# Patient Record
Sex: Male | Born: 1965 | Race: White | Hispanic: No | Marital: Married | Smoking: Former smoker
Health system: Southern US, Community
[De-identification: ages and names within clinical notes are randomized; demographics above are authoritative.]

## PROBLEM LIST (undated history)

## (undated) DIAGNOSIS — E785 Hyperlipidemia, unspecified: Secondary | ICD-10-CM

## (undated) DIAGNOSIS — E669 Obesity, unspecified: Secondary | ICD-10-CM

## (undated) DIAGNOSIS — I509 Heart failure, unspecified: Secondary | ICD-10-CM

## (undated) DIAGNOSIS — J45909 Unspecified asthma, uncomplicated: Secondary | ICD-10-CM

## (undated) DIAGNOSIS — Z86010 Personal history of colonic polyps: Secondary | ICD-10-CM

## (undated) DIAGNOSIS — F1910 Other psychoactive substance abuse, uncomplicated: Secondary | ICD-10-CM

## (undated) DIAGNOSIS — M1990 Unspecified osteoarthritis, unspecified site: Secondary | ICD-10-CM

## (undated) DIAGNOSIS — F419 Anxiety disorder, unspecified: Secondary | ICD-10-CM

## (undated) DIAGNOSIS — R7303 Prediabetes: Secondary | ICD-10-CM

## (undated) DIAGNOSIS — J069 Acute upper respiratory infection, unspecified: Secondary | ICD-10-CM

## (undated) DIAGNOSIS — K52832 Lymphocytic colitis: Secondary | ICD-10-CM

## (undated) DIAGNOSIS — T7840XA Allergy, unspecified, initial encounter: Secondary | ICD-10-CM

## (undated) DIAGNOSIS — E119 Type 2 diabetes mellitus without complications: Secondary | ICD-10-CM

## (undated) DIAGNOSIS — K589 Irritable bowel syndrome without diarrhea: Secondary | ICD-10-CM

## (undated) DIAGNOSIS — I4892 Unspecified atrial flutter: Secondary | ICD-10-CM

## (undated) DIAGNOSIS — C801 Malignant (primary) neoplasm, unspecified: Secondary | ICD-10-CM

## (undated) DIAGNOSIS — G4730 Sleep apnea, unspecified: Secondary | ICD-10-CM

## (undated) DIAGNOSIS — E039 Hypothyroidism, unspecified: Secondary | ICD-10-CM

## (undated) DIAGNOSIS — J449 Chronic obstructive pulmonary disease, unspecified: Secondary | ICD-10-CM

## (undated) DIAGNOSIS — I10 Essential (primary) hypertension: Secondary | ICD-10-CM

## (undated) HISTORY — DX: Unspecified asthma, uncomplicated: J45.909

## (undated) HISTORY — DX: Heart failure, unspecified: I50.9

## (undated) HISTORY — DX: Hypothyroidism, unspecified: E03.9

## (undated) HISTORY — DX: Irritable bowel syndrome, unspecified: K58.9

## (undated) HISTORY — DX: Chronic obstructive pulmonary disease, unspecified: J44.9

## (undated) HISTORY — PX: NO PAST SURGERIES: SHX2092

## (undated) HISTORY — PX: CARDIAC ELECTROPHYSIOLOGY STUDY AND ABLATION: SHX1294

## (undated) HISTORY — DX: Anxiety disorder, unspecified: F41.9

## (undated) HISTORY — DX: Malignant (primary) neoplasm, unspecified: C80.1

## (undated) HISTORY — DX: Type 2 diabetes mellitus without complications: E11.9

## (undated) HISTORY — DX: Personal history of colonic polyps: Z86.010

## (undated) HISTORY — DX: Hyperlipidemia, unspecified: E78.5

## (undated) HISTORY — DX: Prediabetes: R73.03

## (undated) HISTORY — DX: Allergy, unspecified, initial encounter: T78.40XA

## (undated) HISTORY — DX: Unspecified osteoarthritis, unspecified site: M19.90

## (undated) HISTORY — DX: Acute upper respiratory infection, unspecified: J06.9

## (undated) HISTORY — DX: Sleep apnea, unspecified: G47.30

---
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
## 2007-10-01 DIAGNOSIS — I2510 Atherosclerotic heart disease of native coronary artery without angina pectoris: Secondary | ICD-10-CM

## 2007-10-01 HISTORY — DX: Atherosclerotic heart disease of native coronary artery without angina pectoris: I25.10

## 2007-10-01 HISTORY — PX: CORONARY ANGIOGRAM: SHX5786

## 2015-05-31 ENCOUNTER — Ambulatory Visit
Admission: EM | Admit: 2015-05-31 | Discharge: 2015-05-31 | Disposition: A | Discharge status: Home / Self Care | Payer: Self-pay | Attending: Family Medicine | Admitting: Family Medicine

## 2015-05-31 ENCOUNTER — Encounter: Payer: Self-pay | Admitting: Emergency Medicine

## 2015-05-31 DIAGNOSIS — Z0289 Encounter for other administrative examinations: Secondary | ICD-10-CM

## 2015-05-31 LAB — DEPT OF TRANSP DIPSTICK, URINE (ARMC ONLY)
GLUCOSE, UA: NEGATIVE mg/dL
Hgb urine dipstick: NEGATIVE
Protein, ur: NEGATIVE mg/dL
Specific Gravity, Urine: 1.02 (ref 1.005–1.030)

## 2015-05-31 NOTE — ED Provider Notes (Addendum)
SUBJECTIVE: Patient presents for DOT Physical. No acute problems. Patient admits to having a cardiac catheterization in 2009 which was normal. He states this was done because a friend of his died of a MI and his PCP said that his stress test was inconclusive. Patient denies any chest pain or shortness of breath. Patient also admits to having a history of degenerative cervical disc disease. Patient denies any tingling numbness of extremities or weakness of extremities or persistent headaches. He was told that he did not need surgery for his cervical disc disease.    OBJECTIVE: VItals and Exam on form   ASSESSMENT/PLAN: Form reviewed and filled out. Form will be scanned into system. Encouraged regular follow-up with PMD, dentist, and eye doctor yearly. Information given to establish with PCP. Encouraged smoking cessation.  Paulina Fusi, MD 05/31/15 [id number]  Paulina Fusi, MD 05/31/15 [phone]

## 2015-05-31 NOTE — ED Notes (Signed)
Dot exam

## 2015-07-11 ENCOUNTER — Ambulatory Visit: Payer: Self-pay | Admitting: Family Medicine

## 2015-07-18 ENCOUNTER — Ambulatory Visit: Payer: Self-pay | Admitting: Family Medicine

## 2015-07-26 ENCOUNTER — Encounter: Payer: Self-pay | Admitting: Family Medicine

## 2015-07-26 ENCOUNTER — Ambulatory Visit (INDEPENDENT_AMBULATORY_CARE_PROVIDER_SITE_OTHER): Payer: BLUE CROSS/BLUE SHIELD | Admitting: Family Medicine

## 2015-07-26 VITALS — BP 132/90 | HR 68 | Temp 98.7°F | Ht 67.5 in | Wt 255.0 lb

## 2015-07-26 DIAGNOSIS — Z23 Encounter for immunization: Secondary | ICD-10-CM

## 2015-07-26 DIAGNOSIS — Z1211 Encounter for screening for malignant neoplasm of colon: Secondary | ICD-10-CM

## 2015-07-26 DIAGNOSIS — M1000 Idiopathic gout, unspecified site: Secondary | ICD-10-CM

## 2015-07-26 DIAGNOSIS — J329 Chronic sinusitis, unspecified: Secondary | ICD-10-CM | POA: Diagnosis not present

## 2015-07-26 DIAGNOSIS — E669 Obesity, unspecified: Secondary | ICD-10-CM | POA: Insufficient documentation

## 2015-07-26 DIAGNOSIS — M109 Gout, unspecified: Secondary | ICD-10-CM | POA: Insufficient documentation

## 2015-07-26 DIAGNOSIS — M5090 Cervical disc disorder, unspecified, unspecified cervical region: Secondary | ICD-10-CM | POA: Diagnosis not present

## 2015-07-26 DIAGNOSIS — F17200 Nicotine dependence, unspecified, uncomplicated: Secondary | ICD-10-CM

## 2015-07-26 DIAGNOSIS — Z720 Tobacco use: Secondary | ICD-10-CM | POA: Diagnosis not present

## 2015-07-26 DIAGNOSIS — Z87891 Personal history of nicotine dependence: Secondary | ICD-10-CM | POA: Insufficient documentation

## 2015-07-26 MED ORDER — ALLOPURINOL 300 MG PO TABS: 300.0000 mg | ORAL_TABLET | Freq: Every day | ORAL | Status: DC
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
## 2015-07-27 LAB — COMPREHENSIVE METABOLIC PANEL
ALBUMIN: 4.3 g/dL (ref 3.5–5.5)
ALT: 38 IU/L (ref 0–44)
AST: 24 IU/L (ref 0–40)
Albumin/Globulin Ratio: 2 (ref 1.1–2.5)
Alkaline Phosphatase: 57 IU/L (ref 39–117)
BUN / CREAT RATIO: 17 (ref 9–20)
BUN: 12 mg/dL (ref 6–24)
Bilirubin Total: 0.2 mg/dL (ref 0.0–1.2)
CALCIUM: 9.4 mg/dL (ref 8.7–10.2)
CO2: 30 mmol/L — AB (ref 18–29)
CREATININE: 0.7 mg/dL — AB (ref 0.76–1.27)
Chloride: 98 mmol/L (ref 97–106)
GFR calc non Af Amer: 109 mL/min/{1.73_m2} (ref >59)
GFR, EST AFRICAN AMERICAN: 126 mL/min/{1.73_m2} (ref >59)
GLUCOSE: 94 mg/dL (ref 65–99)
Globulin, Total: 2.1 g/dL (ref 1.5–4.5)
Potassium: 5 mmol/L (ref 3.5–5.2)
Sodium: 140 mmol/L (ref 136–144)
TOTAL PROTEIN: 6.4 g/dL (ref 6.0–8.5)

## 2015-07-27 LAB — CBC
HEMATOCRIT: 44 % (ref 37.5–51.0)
HEMOGLOBIN: 14.8 g/dL (ref 12.6–17.7)
MCH: 31.5 pg (ref 26.6–33.0)
MCHC: 33.6 g/dL (ref 31.5–35.7)
MCV: 94 fL (ref 79–97)
Platelets: 197 10*3/uL (ref 150–379)
RBC: 4.7 x10E6/uL (ref 4.14–5.80)
RDW: 12.7 % (ref 12.3–15.4)
WBC: 7.8 10*3/uL (ref 3.4–10.8)

## 2015-07-27 LAB — LIPID PANEL
CHOL/HDL RATIO: 3.8 ratio (ref 0.0–5.0)
Cholesterol, Total: 175 mg/dL (ref 100–199)
HDL: 46 mg/dL (ref >39)
LDL CALC: 62 mg/dL (ref 0–99)
Triglycerides: 335 mg/dL — ABNORMAL HIGH (ref 0–149)
VLDL Cholesterol Cal: 67 mg/dL — ABNORMAL HIGH (ref 5–40)

## 2015-07-27 LAB — HEMOGLOBIN A1C
Est. average glucose Bld gHb Est-mCnc: 120 mg/dL
Hgb A1c MFr Bld: 5.8 % — ABNORMAL HIGH (ref 4.8–5.6)

## 2015-07-27 LAB — VITAMIN D 25 HYDROXY (VIT D DEFICIENCY, FRACTURES): VIT D 25 HYDROXY: 24.5 ng/mL — AB (ref 30.0–100.0)

## 2015-07-27 LAB — URIC ACID: URIC ACID: 6.2 mg/dL (ref 3.7–8.6)

## 2015-08-29 ENCOUNTER — Ambulatory Visit: Payer: BLUE CROSS/BLUE SHIELD | Admitting: Family Medicine

## 2015-09-05 ENCOUNTER — Ambulatory Visit (INDEPENDENT_AMBULATORY_CARE_PROVIDER_SITE_OTHER): Payer: BLUE CROSS/BLUE SHIELD | Admitting: Family Medicine

## 2015-09-05 ENCOUNTER — Encounter: Payer: Self-pay | Admitting: Family Medicine

## 2015-09-05 VITALS — BP 126/88 | HR 76 | Ht 67.5 in | Wt 253.6 lb

## 2015-09-05 DIAGNOSIS — M5090 Cervical disc disorder, unspecified, unspecified cervical region: Secondary | ICD-10-CM

## 2015-09-05 DIAGNOSIS — R7303 Prediabetes: Secondary | ICD-10-CM

## 2015-09-05 DIAGNOSIS — E782 Mixed hyperlipidemia: Secondary | ICD-10-CM | POA: Insufficient documentation

## 2015-09-05 DIAGNOSIS — J329 Chronic sinusitis, unspecified: Secondary | ICD-10-CM | POA: Diagnosis not present

## 2015-09-05 DIAGNOSIS — Z720 Tobacco use: Secondary | ICD-10-CM | POA: Diagnosis not present

## 2015-09-05 DIAGNOSIS — M1A00X0 Idiopathic chronic gout, unspecified site, without tophus (tophi): Secondary | ICD-10-CM | POA: Diagnosis not present

## 2015-09-05 DIAGNOSIS — E669 Obesity, unspecified: Secondary | ICD-10-CM

## 2015-09-05 DIAGNOSIS — F17200 Nicotine dependence, unspecified, uncomplicated: Secondary | ICD-10-CM

## 2015-09-05 DIAGNOSIS — E559 Vitamin D deficiency, unspecified: Secondary | ICD-10-CM | POA: Diagnosis not present

## 2015-09-05 DIAGNOSIS — E781 Pure hyperglyceridemia: Secondary | ICD-10-CM | POA: Insufficient documentation

## 2015-09-05 NOTE — Progress Notes (Signed)
Date:  09/05/2015   Name:  Ralph Mack   DOB:  1966-08-28   MRN:  [id number]  PCP:  Adline Potter, MD    Chief Complaint: Gout and Hyperglycemia   History of Present Illness:  This is a 49 y.o. male seen in f/u from initial visit last month. Blood work showed prediabetes, he has cut back on carbs and sweets and just installed a treadmill in his home.  Also showed vit D def and he is taking supplementation. Smoking less as not drinking beer daily. Has ENT referral for recurrent sinusitis but has not scheduled appt yet. Has also not seen chiro or GI yet. No recent gout flares.  Review of Systems:  Review of Systems  Respiratory: Negative for shortness of breath.   Cardiovascular: Negative for chest pain and leg swelling.  Endocrine: Negative for polyuria.  Genitourinary: Negative for difficulty urinating.  Neurological: Negative for syncope and light-headedness.    Patient Active Problem List   Diagnosis Date Noted  . Prediabetes 09/05/2015  . Vitamin D deficiency 09/05/2015  . Gout 07/26/2015  . Obesity, Class II, BMI 35-39.9, isolated ([place]) 07/26/2015  . Smoker 07/26/2015  . Recurrent sinusitis 07/26/2015  . Cervical disc disease 07/26/2015    Prior to Admission medications   Medication Sig Start Date End Date Taking? Authorizing Provider  allopurinol (ZYLOPRIM) 300 MG tablet Take 1 tablet (300 mg total) by mouth daily. 07/26/15  Yes Adline Potter, MD    No Known Allergies  No past surgical history on file.  Social History  Substance Use Topics  . Smoking status: Current Every Day Smoker  . Smokeless tobacco: None  . Alcohol Use: 0.0 oz/week    0 Standard drinks or equivalent per week    No family history on file.  Medication list has been reviewed and updated.  Physical Examination: BP 126/88 mmHg  Pulse 76  Ht 5' 7.5" (1.715 m)  Wt 253 lb 9.6 oz (115.032 kg)  BMI 39.11 kg/m2  Physical Exam  Constitutional: He appears well-developed and well-nourished.   Cardiovascular: Normal rate, regular rhythm and normal heart sounds.   Pulmonary/Chest: Effort normal and breath sounds normal.  Musculoskeletal: He exhibits no edema.  Neurological: He is alert.  Skin: Skin is warm and dry.  Psychiatric: He has a normal mood and affect. His behavior is normal.  Nursing note and vitals reviewed.   Assessment and Plan:  1. Prediabetes Dx discussed, cont NAS/low carb diet, exercise, weight loss, recheck a1c next visit  2. Obesity, Class II, BMI 35-39.9, isolated (HCC) Cont weight loss (down 2# today)  3. Smoker Cont reduction, encouraged cessation  4. Vitamin D deficiency On supplementation, recheck level next visit  5. Cervical disc disease To see chiro  6. Recurrent sinusitis To see ENT  7. Idiopathic chronic gout without tophus, unspecified site Stable, cont allopurinol (uric acid 6.2)   Return in about 3 months (around 12/04/2015).  Satira Anis. [facility]  09/05/2015

## 2015-09-12 ENCOUNTER — Other Ambulatory Visit: Payer: Self-pay | Admitting: Otolaryngology

## 2015-09-12 ENCOUNTER — Ambulatory Visit
Admission: RE | Admit: 2015-09-12 | Discharge: 2015-09-12 | Disposition: A | Discharge status: Home / Self Care | Payer: Self-pay | Source: Ambulatory Visit | Attending: Otolaryngology | Admitting: Otolaryngology

## 2015-09-12 DIAGNOSIS — J410 Simple chronic bronchitis: Secondary | ICD-10-CM

## 2015-09-13 ENCOUNTER — Other Ambulatory Visit: Payer: Self-pay

## 2015-09-13 ENCOUNTER — Telehealth: Payer: Self-pay

## 2015-09-13 DIAGNOSIS — Z1211 Encounter for screening for malignant neoplasm of colon: Secondary | ICD-10-CM

## 2015-09-13 MED ORDER — PEG 3350-KCL-NA BICARB-NACL 420 G PO SOLR: 4000.0000 mL | Freq: Once | ORAL | Status: DC

## 2015-09-13 NOTE — Telephone Encounter (Signed)
Gastroenterology Pre-Procedure Review  Request Date: 10/23/15 Requesting Physician: Dr. Vicente Masson  PATIENT REVIEW QUESTIONS: The patient responded to the following health history questions as indicated:    1. Are you having any GI issues? no 2. Do you have a personal history of Polyps? no 3. Do you have a family history of Colon Cancer or Polyps? no 4. Diabetes Mellitus? yes (Prediabetic) 5. Joint replacements in the past 12 months?no 6. Major health problems in the past 3 months?no 7. Any artificial heart valves, MVP, or defibrillator?no    MEDICATIONS & ALLERGIES:    Patient reports the following regarding taking any anticoagulation/antiplatelet therapy:   Plavix, Coumadin, Eliquis, Xarelto, Lovenox, Pradaxa, Brilinta, or Effient? no Aspirin? no  Patient confirms/reports the following medications:  Current Outpatient Prescriptions  Medication Sig Dispense Refill  . allopurinol (ZYLOPRIM) 300 MG tablet Take 1 tablet (300 mg total) by mouth daily. 90 tablet 3   No current facility-administered medications for this visit.    Patient confirms/reports the following allergies:  No Known Allergies  No orders of the defined types were placed in this encounter.    AUTHORIZATION INFORMATION Primary Insurance: 1D#: Group #:  Secondary Insurance: 1D#: Group #:  SCHEDULE INFORMATION: Date: 10/23/15 Time: Location: [place]

## 2015-09-22 ENCOUNTER — Ambulatory Visit
Admission: EM | Admit: 2015-09-22 | Discharge: 2015-09-22 | Discharge status: Absent without leave | Payer: BLUE CROSS/BLUE SHIELD

## 2015-10-23 ENCOUNTER — Encounter: Admission: RE | Payer: Self-pay | Source: Ambulatory Visit

## 2015-10-23 ENCOUNTER — Ambulatory Visit
Admission: RE | Admit: 2015-10-23 | Payer: BLUE CROSS/BLUE SHIELD | Source: Ambulatory Visit | Admitting: Gastroenterology

## 2015-10-23 SURGERY — COLONOSCOPY WITH PROPOFOL
Anesthesia: Choice

## 2015-12-05 ENCOUNTER — Ambulatory Visit: Payer: BLUE CROSS/BLUE SHIELD | Admitting: Family Medicine

## 2016-08-14 ENCOUNTER — Other Ambulatory Visit: Payer: Self-pay | Admitting: Family Medicine

## 2016-08-14 ENCOUNTER — Other Ambulatory Visit: Payer: Self-pay

## 2016-08-14 DIAGNOSIS — M1000 Idiopathic gout, unspecified site: Secondary | ICD-10-CM

## 2016-09-26 ENCOUNTER — Ambulatory Visit (INDEPENDENT_AMBULATORY_CARE_PROVIDER_SITE_OTHER): Payer: BLUE CROSS/BLUE SHIELD | Admitting: Family Medicine

## 2016-09-26 ENCOUNTER — Telehealth: Payer: Self-pay | Admitting: Family Medicine

## 2016-09-26 ENCOUNTER — Encounter: Payer: Self-pay | Admitting: Family Medicine

## 2016-09-26 VITALS — BP 128/79 | HR 84 | Ht 67.5 in | Wt 243.3 lb

## 2016-09-26 DIAGNOSIS — Z800 Family history of malignant neoplasm of digestive organs: Secondary | ICD-10-CM | POA: Diagnosis not present

## 2016-09-26 DIAGNOSIS — E66812 Obesity, class 2: Secondary | ICD-10-CM

## 2016-09-26 DIAGNOSIS — M1A4710 Other secondary chronic gout, right ankle and foot, without tophus (tophi): Secondary | ICD-10-CM | POA: Diagnosis not present

## 2016-09-26 DIAGNOSIS — F1911 Other psychoactive substance abuse, in remission: Secondary | ICD-10-CM

## 2016-09-26 DIAGNOSIS — F17200 Nicotine dependence, unspecified, uncomplicated: Secondary | ICD-10-CM

## 2016-09-26 DIAGNOSIS — K921 Melena: Secondary | ICD-10-CM | POA: Diagnosis not present

## 2016-09-26 DIAGNOSIS — Z87898 Personal history of other specified conditions: Secondary | ICD-10-CM

## 2016-09-26 DIAGNOSIS — E669 Obesity, unspecified: Secondary | ICD-10-CM | POA: Diagnosis not present

## 2016-09-26 DIAGNOSIS — F1020 Alcohol dependence, uncomplicated: Secondary | ICD-10-CM

## 2016-09-26 DIAGNOSIS — Z1211 Encounter for screening for malignant neoplasm of colon: Secondary | ICD-10-CM

## 2016-09-26 DIAGNOSIS — J3089 Other allergic rhinitis: Secondary | ICD-10-CM | POA: Diagnosis not present

## 2016-09-26 DIAGNOSIS — R6882 Decreased libido: Secondary | ICD-10-CM | POA: Diagnosis not present

## 2016-09-26 DIAGNOSIS — F10920 Alcohol use, unspecified with intoxication, uncomplicated: Secondary | ICD-10-CM | POA: Insufficient documentation

## 2016-09-26 NOTE — Assessment & Plan Note (Addendum)
Risks assoc with heavy drinking r/w pt.   Pt denies depression/ mood disorder, denies significant barriers of education, money etc.  Explained many significant risks associated with this disease process.   Will check LFT's etc esp prior to starting on chronic gout meds.   Advised pt to cut back significantly on etoh use.

## 2016-09-26 NOTE — Telephone Encounter (Signed)
Patient has enough allopurinol to last til next week and was wondering if Dr. Jenetta Downer would call ina 10 day supply for him

## 2016-09-26 NOTE — Patient Instructions (Addendum)
If you have insomnia or difficulty sleeping, this information is for you:  - Avoid caffeinated beverages after lunch,  no alcoholic beverages,  no eating within 2-3 hours of lying down,  avoid exposure to blue light before bed,  avoid daytime naps, and  needs to maintain a regular sleep schedule- go to sleep and wake up around the same time every night.   - Resolve concerns or worries before entering bedroom:  Discussed relaxation techniques with patient and to keep a journal to write down fears\ worries.  I suggested seeing a counselor for CBT.   - Recommend patient meditate or do deep breathing exercises to help relax.   Incorporate the use of white noise machines or listen to "sleep meditation music", or recordings of guided meditations for sleep from YouTube which are free, such as  "guided meditation for detachment from over thinking"  by Mayford Knife.        ------->   Sinus rinses BID- use distilled water.

## 2016-09-26 NOTE — Progress Notes (Signed)
New patient office visit note:  Impression and Recommendations:    1. 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 Smoker   3. Low libido   4. Environmental and seasonal allergies   5. Uncomplicated alcohol dependence ([place])   6. History of substance abuse   7. Hematochezia   8. Screening for colon cancer   9. Obesity, Class II, BMI 35-39.9, isolated   10. Family hx of colon cancer requiring screening colonoscopy     Smoker No desire to quit currently.  But will consider making it a goal in future.  Handouts given to pt.    Gout First flare 2006-->Chronic, recurring, usually in great toe.  Last flare was 3 yrs ago. So well controlled.     Will obtain bldwrk; advised pt lifestyle and diet mod at this time. ( esp cut out beer )   Alcohol use Risks assoc with heavy drinking r/w pt.   Pt denies depression/ mood disorder, denies significant barriers of education, money etc.  Explained many significant risks associated with this disease process.   Will check LFT's etc esp prior to starting on chronic gout meds.   Advised pt to cut back significantly on etoh use.  History of substance abuse Explained inc risk CV Dx etc to pt.   Will obtain FLP.   Advised exercise 16min QD  Obesity, Class II, BMI 35-39.9, isolated Rec wt loss.   Declines nutrition referral  Will obtain labs to r/o Pre-DM/ DM, HLD/ TG, Tsh d/o etc near future.    Pt told to come in for fasting bldwrk prior to next appt.   Family hx of colon cancer requiring screening colonoscopy Referral for colonoscopy sent today.  Counseling done  Hematochezia Pt declines rectal today.  Please give pt hemoccult cards for home use.   Referral to GI for scope     Orders Placed This Encounter  Procedures  . Ambulatory referral to Gastroenterology    The patient was counseled, risk factors were discussed, anticipatory guidance given.  Gross side  effects, risk and benefits, and alternatives of medications discussed with patient.  Patient is aware that all medications have potential side effects and we are unable to predict every side effect or drug-drug interaction that may occur.  Expresses verbal understanding and consents to current therapy plan and treatment regimen.  Return for near future for FBW , then oV.  Please see AVS handed out to patient at the end of our visit for further patient instructions/ counseling done pertaining to today's office visit.    Note: This document was prepared using Dragon voice recognition software and may include unintentional dictation errors.  ----------------------------------------------------------------------------------------------------------------------    Subjective:    Chief Complaint  Patient presents with  . Establish Care    HPI: Ralph Mack is a pleasant 50 y.o. male who presents to [place] [facility] today to review their medical history with me and establish care.   I asked the patient to review their chronic problem list with me to ensure everything was updated and accurate.  Denies chronic issues of HTN, HLD, DM/Pre-Dm etc.                                              Patient is married to  Denny Peon- my patient as well.  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  They have 2 children and he lives at home with his wife and daughter.   Tobacco abuse:   Current smoker  ,Smoking since 50 yo-->  Around 1 ppd 32 yrs.  Not interested in quitting. But has quit many times in the past.    Gout First flare 2006--> last one 3 yrs ago.  Chronic,  recurring, usually in great toe.  No acute sx today. Worried he needs chronic suppressive medications  Alcohol abuse:   Also drinks excessively-  12-18 drinks per day on the weekends- beer and liquor.   Not interested in cutting back.   History of drug abuse:   Used regularly for 8-10 yrs.  No use  many yrs--->  20 yrs.    Explained to patient this pts him at increased risk for heart attacks and stroke in the future.      -Patient has complaints today of  rectal bleeding of many years duration - painless and usually the first bowel movement of the day.  Very small amounts.  He tells me that his prior doctor, Dr. Vicente Masson, had referred him to gastroenterology for a colonoscopy but patient never went.  He would like another referral today.   -Patient also had concerns about his low sex drive/ libido for the past year.  Feels bad because he says he lacks desire, no erectile dysf or other  - Also c/o insomnia, many yrs.  No sleep hygeine- very poor schedule, no routine.      Wt Readings from Last 3 Encounters:  11/08/16 247 lb (112 kg)  10/31/16 247 lb (112 kg)  10/24/16 248 lb 8 oz (112.7 kg)   BP Readings from Last 3 Encounters:  11/08/16 (!) 150/93  10/31/16 116/72  10/24/16 121/77   Pulse Readings from Last 3 Encounters:  11/08/16 62  10/31/16 68  10/24/16 73   BMI Readings from Last 3 Encounters:  11/08/16 38.69 kg/m  10/31/16 38.69 kg/m  10/24/16 38.35 kg/m    Patient Care Team    Relationship Specialty Notifications Start End  [name], DO PCP - General Family Medicine  09/26/16   Melida Quitter, MD Consulting Physician Otolaryngology  09/26/16   Shaylar Charmian Muff, MD Consulting Physician Allergy  09/26/16    Comment: [address] [place].   Gatha Mayer, MD Consulting Physician Gastroenterology  11/21/16     Patient Active Problem List   Diagnosis Date Noted  . Hypothyroidism 10/24/2016    Priority: High  . OAB (overactive bladder) 10/24/2016    Priority: High  . Prediabetes 09/05/2015    Priority: High  . Hypertriglyceridemia 09/05/2015    Priority: High  . Obesity, Class II, BMI 35-39.9, isolated 07/26/2015    Priority: High  . Smoker 07/26/2015    Priority: High  . Fatigue 10/24/2016    Priority: Medium  . Alcohol use 09/26/2016     Priority: Medium  . History of substance abuse 09/26/2016    Priority: Medium  . Gout 07/26/2015    Priority: Medium  . Urinary frequency 10/24/2016    Priority: Low  . Low libido 09/26/2016    Priority: Low  . Environmental and seasonal allergies 09/26/2016    Priority: Low  . Family hx of colon cancer requiring screening colonoscopy 09/26/2016    Priority: Low  . Vitamin D deficiency 09/05/2015    Priority: Low  . Hx of colonic polyp  11/13/2016  . Hematochezia 09/26/2016  . Screening for colon cancer 09/26/2016  . Recurrent sinusitis 07/26/2015  . Cervical disc disease 07/26/2015     Past Medical History:  Diagnosis Date  . Allergy   . Arthritis   . Hx of colonic polyp 11/13/2016   10/2016 7 mm ssp/a - recall 2023  . Hypothyroidism   . Prediabetes   . Recurrent upper respiratory infection (URI)      Past Medical History:  Diagnosis Date  . Allergy   . Arthritis   . Hx of colonic polyp 11/13/2016   10/2016 7 mm ssp/a - recall 2023  . Hypothyroidism   . Prediabetes   . Recurrent upper respiratory infection (URI)      Past Surgical History:  Procedure Laterality Date  . NO PAST SURGERIES       Family History  Problem Relation Age of Onset  . Asthma Mother   . COPD Mother   . Cancer Mother     skin  . Heart disease Father   . Parkinson's disease Father   . Depression Daughter   . Diabetes Paternal Grandfather   . Kidney disease Paternal Grandfather   . Colon polyps Neg Hx   . Colon cancer Neg Hx   . Esophageal cancer Neg Hx   . Stomach cancer Neg Hx   . Rectal cancer Neg Hx      History  Drug Use No    History  Alcohol Use  . 12.0 oz/week  . 20 Cans of beer per week    History  Smoking Status  . Current Every Day Smoker  . Packs/day: 1.00  . Years: 30.00  . Types: Cigarettes  Smokeless Tobacco  . Never Used     Previous Medications   FEXOFENADINE (ALLEGRA) 180 MG TABLET    Take 180 mg by mouth daily.   FLUTICASONE (FLONASE) 50  MCG/ACT NASAL SPRAY    Place 2 sprays into the nose daily.     Allergies: Patient has no known allergies.  Review of Systems  Constitutional: Positive for diaphoresis. Negative for chills, fever, malaise/fatigue and weight loss.  HENT: Positive for tinnitus. Negative for congestion and sore throat.   Eyes: Negative for blurred vision, double vision and photophobia.  Respiratory: Negative for cough and wheezing.   Cardiovascular: Negative for chest pain and palpitations.  Gastrointestinal: Negative for blood in stool, diarrhea, nausea and vomiting.  Genitourinary: Negative for dysuria, frequency and urgency.       Nocturia  Musculoskeletal: Negative for joint pain and myalgias.  Skin: Negative for itching and rash.  Neurological: Negative for dizziness, focal weakness, weakness and headaches.  Endo/Heme/Allergies: Negative for environmental allergies and polydipsia. Does not bruise/bleed easily.  Psychiatric/Behavioral: Negative for depression and memory loss. The patient is nervous/anxious. The patient does not have insomnia.      Objective:    Blood pressure 128/79, pulse 84, height 5' 7.5" (1.715 m), weight 243 lb 4.8 oz (110.4 kg). Body mass index is 37.54 kg/m. General: Well Developed, well nourished, and in no acute distress.  Neuro: Alert and oriented x3, extra-ocular muscles intact, sensation grossly intact.  HEENT: Normocephalic, atraumatic, pupils equal round reactive to light, neck supple Cardiac: S1,S2, Regular rate and rhythm Respiratory: Essentially clear to auscultation bilaterally- distant due to habitus. Not using accessory muscles, speaking in full sentences.  Abdominal: not grossly distended Musculoskeletal: Ambulates w/o diff, FROM * 4 ext.  Vasc: less 2 sec cap RF, warm and pink  Psych:  No HI/SI, judgement and insight good, Euthymic mood. Full Affect.

## 2016-09-26 NOTE — Assessment & Plan Note (Addendum)
Explained inc risk CV Dx etc to pt.   Will obtain FLP.   Advised exercise 53min QD

## 2016-09-26 NOTE — Assessment & Plan Note (Addendum)
No desire to quit currently.  But will consider making it a goal in future.  Handouts given to pt.

## 2016-09-26 NOTE — Assessment & Plan Note (Addendum)
First flare 2006-->Chronic, recurring, usually in great toe.  Last flare was 3 yrs ago. So well controlled.     Will obtain bldwrk; advised pt lifestyle and diet mod at this time. ( esp cut out beer )

## 2016-09-27 ENCOUNTER — Ambulatory Visit: Payer: Self-pay | Admitting: Allergy

## 2016-09-27 ENCOUNTER — Other Ambulatory Visit: Payer: Self-pay | Admitting: Family Medicine

## 2016-09-27 ENCOUNTER — Encounter: Payer: Self-pay | Admitting: Nurse Practitioner

## 2016-09-27 ENCOUNTER — Other Ambulatory Visit: Payer: Self-pay

## 2016-09-27 DIAGNOSIS — M1000 Idiopathic gout, unspecified site: Secondary | ICD-10-CM

## 2016-09-27 MED ORDER — ALLOPURINOL 300 MG PO TABS: 300.0000 mg | ORAL_TABLET | Freq: Every day | ORAL | 3 refills | Status: DC

## 2016-09-27 MED ORDER — ALLOPURINOL 300 MG PO TABS: 300.0000 mg | ORAL_TABLET | Freq: Every day | ORAL | 0 refills | Status: DC

## 2016-09-27 NOTE — Telephone Encounter (Signed)
Pt informed that RX for 10 days sent to pharmacy.  Charyl Bigger, CMA

## 2016-09-30 HISTORY — PX: COLONOSCOPY: SHX174

## 2016-10-01 ENCOUNTER — Telehealth: Payer: Self-pay | Admitting: Family Medicine

## 2016-10-01 NOTE — Telephone Encounter (Signed)
Pt's chart already has [facility] [address] in [place] listed as their Pharmacy. Bobetta Lime CMA, RT

## 2016-10-01 NOTE — Telephone Encounter (Signed)
Patient is requesting his pharmacy be changed from [facility] in [place] to [facility] [address] in [place]

## 2016-10-02 ENCOUNTER — Other Ambulatory Visit: Payer: Self-pay

## 2016-10-02 DIAGNOSIS — E559 Vitamin D deficiency, unspecified: Secondary | ICD-10-CM

## 2016-10-02 DIAGNOSIS — E781 Pure hyperglyceridemia: Secondary | ICD-10-CM

## 2016-10-02 DIAGNOSIS — R7303 Prediabetes: Secondary | ICD-10-CM

## 2016-10-02 DIAGNOSIS — Z0000 Encounter for general adult medical examination without abnormal findings: Secondary | ICD-10-CM

## 2016-10-03 ENCOUNTER — Other Ambulatory Visit (INDEPENDENT_AMBULATORY_CARE_PROVIDER_SITE_OTHER): Payer: BLUE CROSS/BLUE SHIELD

## 2016-10-03 DIAGNOSIS — Z0000 Encounter for general adult medical examination without abnormal findings: Secondary | ICD-10-CM

## 2016-10-03 DIAGNOSIS — R7303 Prediabetes: Secondary | ICD-10-CM

## 2016-10-03 DIAGNOSIS — E559 Vitamin D deficiency, unspecified: Secondary | ICD-10-CM

## 2016-10-03 DIAGNOSIS — E781 Pure hyperglyceridemia: Secondary | ICD-10-CM

## 2016-10-03 LAB — POCT GLYCOSYLATED HEMOGLOBIN (HGB A1C): Hemoglobin A1C: 5.4

## 2016-10-04 LAB — LIPID PANEL
CHOL/HDL RATIO: 4 ratio (ref 0.0–5.0)
Cholesterol, Total: 184 mg/dL (ref 100–199)
HDL: 46 mg/dL (ref >39)
LDL CALC: 113 mg/dL — AB (ref 0–99)
Triglycerides: 124 mg/dL (ref 0–149)
VLDL Cholesterol Cal: 25 mg/dL (ref 5–40)

## 2016-10-04 LAB — COMPREHENSIVE METABOLIC PANEL
A/G RATIO: 1.9 (ref 1.2–2.2)
ALBUMIN: 4.3 g/dL (ref 3.5–5.5)
ALT: 37 IU/L (ref 0–44)
AST: 22 IU/L (ref 0–40)
Alkaline Phosphatase: 52 IU/L (ref 39–117)
BUN/Creatinine Ratio: 21 — ABNORMAL HIGH (ref 9–20)
BUN: 18 mg/dL (ref 6–24)
Bilirubin Total: 0.4 mg/dL (ref 0.0–1.2)
CALCIUM: 9.1 mg/dL (ref 8.7–10.2)
CO2: 30 mmol/L — ABNORMAL HIGH (ref 18–29)
Chloride: 96 mmol/L (ref 96–106)
Creatinine, Ser: 0.86 mg/dL (ref 0.76–1.27)
GFR, EST AFRICAN AMERICAN: 115 mL/min/{1.73_m2} (ref >59)
GFR, EST NON AFRICAN AMERICAN: 100 mL/min/{1.73_m2} (ref >59)
GLOBULIN, TOTAL: 2.3 g/dL (ref 1.5–4.5)
Glucose: 110 mg/dL — ABNORMAL HIGH (ref 65–99)
Potassium: 5.3 mmol/L — ABNORMAL HIGH (ref 3.5–5.2)
SODIUM: 139 mmol/L (ref 134–144)
TOTAL PROTEIN: 6.6 g/dL (ref 6.0–8.5)

## 2016-10-04 LAB — CBC
HEMATOCRIT: 44.1 % (ref 37.5–51.0)
HEMOGLOBIN: 15 g/dL (ref 13.0–17.7)
MCH: 32.2 pg (ref 26.6–33.0)
MCHC: 34 g/dL (ref 31.5–35.7)
MCV: 95 fL (ref 79–97)
Platelets: 160 10*3/uL (ref 150–379)
RBC: 4.66 x10E6/uL (ref 4.14–5.80)
RDW: 13 % (ref 12.3–15.4)
WBC: 8.2 10*3/uL (ref 3.4–10.8)

## 2016-10-04 LAB — TSH: TSH: 5.01 u[IU]/mL — AB (ref 0.450–4.500)

## 2016-10-04 LAB — VITAMIN D 25 HYDROXY (VIT D DEFICIENCY, FRACTURES): Vit D, 25-Hydroxy: 50.3 ng/mL (ref 30.0–100.0)

## 2016-10-09 ENCOUNTER — Encounter: Payer: Self-pay | Admitting: Family Medicine

## 2016-10-10 ENCOUNTER — Encounter: Payer: Self-pay | Admitting: Nurse Practitioner

## 2016-10-10 ENCOUNTER — Ambulatory Visit (INDEPENDENT_AMBULATORY_CARE_PROVIDER_SITE_OTHER): Payer: BLUE CROSS/BLUE SHIELD | Admitting: Nurse Practitioner

## 2016-10-10 VITALS — BP 120/78 | HR 76 | Ht 67.5 in | Wt 247.4 lb

## 2016-10-10 DIAGNOSIS — K625 Hemorrhage of anus and rectum: Secondary | ICD-10-CM

## 2016-10-10 NOTE — Progress Notes (Signed)
      HPI: Patient is a 51 year old male referred by PCP, Dr. [name],  for rectal bleeding. Patient has occasional painless rectal bleeding with BMs, usually occurs with first BM of the day. Bleeding is small volume and has been happening for years. He has occasional constipation. . Never had a colonoscopy before, he has no [facility] of colon cancer.   Patient has a remote history of GERD. He used to take a PPI for heartburn but no longer needs it. He has frequent gas type pain in his chest which is always relieved with belching. No dysphagia, no unusual weight loss. No SOB or cough. States he had an unremarkable angiogram  in 2009   Past Medical History:  Diagnosis Date  . Allergy   . Arthritis   . Prediabetes     No past surgical history on file.   Family History  Problem Relation Age of Onset  . Asthma Mother   . COPD Mother   . Cancer Mother     skin  . Heart disease Father   . Parkinson's disease Father   . Depression Daughter   . Diabetes Paternal Grandfather   . Kidney disease Paternal Grandfather    Social History  Substance Use Topics  . Smoking status: Current Every Day Smoker    Packs/day: 1.00    Years: 30.00    Types: Cigarettes  . Smokeless tobacco: Never Used  . Alcohol use 12.0 oz/week    20 Cans of beer per week   Current Outpatient Prescriptions  Medication Sig Dispense Refill  . allopurinol (ZYLOPRIM) 300 MG tablet Take 1 tablet (300 mg total) by mouth daily. 90 tablet 3  . Cholecalciferol (VITAMIN D3) 10000 units TABS Take 2 tablets by mouth daily.    . fexofenadine (ALLEGRA) 180 MG tablet Take 180 mg by mouth daily.    . fluticasone (FLONASE) 50 MCG/ACT nasal spray Place 2 sprays into the nose daily.     No current facility-administered medications for this visit.    No Known Allergies   Review of Systems: Positive for allergy, sinus trouble, anxiety, arthritis, back pain, fatigue, headaches, ringing in ears, left arm tingling,   nosebleeds, sleeping problems, excessive urination. All other systems reviewed and negative except where noted in HPI.    Physical Exam: BP 120/78   Pulse 76   Ht 5' 7.5" (1.715 m)   Wt 247 lb 6.4 oz (112.2 kg)   BMI 38.18 kg/m  Constitutional:  Obese white malle in no acute distress. Psychiatric: Normal mood and affect. Behavior is normal. HEENT: Normocephalic and atraumatic. Conjunctivae are normal. No scleral icterus. Neck supple.  Cardiovascular: Normal rate, regular rhythm.  Pulmonary/chest: Effort normal and breath sounds normal. No wheezing, rales or rhonchi. Abdominal: Soft, nondistended, nontender. Bowel sounds active throughout. There are no masses palpable. No hepatomegaly. Extremities: no edema Lymphadenopathy: No cervical adenopathy noted. Neurological: Alert and oriented to person place and time. Skin: Skin is warm and dry. No rashes noted.   ASSESSMENT AND PLAN:  1. 51 yo male with occasional (but chronic), painless rectal bleeding with bowel movements. For further evaluation patientt will be scheduled for a colonoscopy with possible polypectomy.  The risks and benefits of the procedure were discussed and the patient agrees to proceed.   2. Alcohol overuse. LFTs normal   Tye Savoy, NP  10/10/2016, 10:01 AM  Cc: [name], DO

## 2016-10-10 NOTE — Patient Instructions (Signed)

## 2016-10-13 LAB — URIC ACID: Uric Acid: 6.5 mg/dL (ref 3.7–8.6)

## 2016-10-13 LAB — SPECIMEN STATUS REPORT

## 2016-10-13 NOTE — Progress Notes (Signed)
Agree with Ms. Guenther's assessment and plan. Carl E. Gessner, MD, FACG   

## 2016-10-17 ENCOUNTER — Ambulatory Visit: Payer: Self-pay | Admitting: Adult Health

## 2016-10-24 ENCOUNTER — Encounter: Payer: Self-pay | Admitting: Family Medicine

## 2016-10-24 ENCOUNTER — Ambulatory Visit (INDEPENDENT_AMBULATORY_CARE_PROVIDER_SITE_OTHER): Payer: BLUE CROSS/BLUE SHIELD | Admitting: Family Medicine

## 2016-10-24 VITALS — BP 121/77 | HR 73 | Ht 67.5 in | Wt 248.5 lb

## 2016-10-24 DIAGNOSIS — N3281 Overactive bladder: Secondary | ICD-10-CM | POA: Diagnosis not present

## 2016-10-24 DIAGNOSIS — R5383 Other fatigue: Secondary | ICD-10-CM | POA: Insufficient documentation

## 2016-10-24 DIAGNOSIS — E781 Pure hyperglyceridemia: Secondary | ICD-10-CM

## 2016-10-24 DIAGNOSIS — E039 Hypothyroidism, unspecified: Secondary | ICD-10-CM | POA: Diagnosis not present

## 2016-10-24 DIAGNOSIS — E559 Vitamin D deficiency, unspecified: Secondary | ICD-10-CM

## 2016-10-24 DIAGNOSIS — R350 Frequency of micturition: Secondary | ICD-10-CM | POA: Insufficient documentation

## 2016-10-24 DIAGNOSIS — E669 Obesity, unspecified: Secondary | ICD-10-CM

## 2016-10-24 DIAGNOSIS — Z1211 Encounter for screening for malignant neoplasm of colon: Secondary | ICD-10-CM

## 2016-10-24 DIAGNOSIS — R7303 Prediabetes: Secondary | ICD-10-CM

## 2016-10-24 DIAGNOSIS — R6882 Decreased libido: Secondary | ICD-10-CM

## 2016-10-24 DIAGNOSIS — K921 Melena: Secondary | ICD-10-CM

## 2016-10-24 LAB — POCT URINALYSIS DIPSTICK
Bilirubin, UA: NEGATIVE
Glucose, UA: NEGATIVE
Ketones, UA: NEGATIVE
Leukocytes, UA: NEGATIVE
NITRITE UA: NEGATIVE
PH UA: 6
PROTEIN UA: NEGATIVE
RBC UA: NEGATIVE
Spec Grav, UA: 1.02
UROBILINOGEN UA: 0.2

## 2016-10-24 LAB — POC HEMOCCULT BLD/STL (OFFICE/1-CARD/DIAGNOSTIC): FECAL OCCULT BLD: NEGATIVE

## 2016-10-24 MED ORDER — OXYBUTYNIN CHLORIDE ER 5 MG PO TB24: 5.0000 mg | ORAL_TABLET | Freq: Every day | ORAL | 1 refills | Status: DC

## 2016-10-24 MED ORDER — VITAMIN D3 125 MCG (5000 UT) PO TABS: ORAL_TABLET | ORAL | 3 refills | Status: DC

## 2016-10-24 MED ORDER — LEVOTHYROXINE SODIUM 50 MCG PO TABS: 50.0000 ug | ORAL_TABLET | Freq: Every day | ORAL | 3 refills | Status: DC

## 2016-10-24 NOTE — Assessment & Plan Note (Signed)
>>  ASSESSMENT AND PLAN FOR OBESITY, CLASS II, BMI 35-39.9, ISOLATED WRITTEN ON 10/24/2016 11:46 AM BY OPALSKI, DEBORAH, DO  Explained to patient what BMI refers to, and what it means medically.    Told patient to think about it as a "medical risk stratification measurement" and how increasing BMI is associated with increasing risk/ or worsening state of various diseases such as hypertension, hyperlipidemia, diabetes, premature OA, depression etc.  American Heart Association guidelines for healthy diet, basically Mediterranean diet, and exercise guidelines of 30 minutes 5 days per week or more discussed in detail.  Health counseling performed.  All questions answered.

## 2016-10-24 NOTE — Assessment & Plan Note (Signed)
Hemoccult blood was negative today.

## 2016-10-24 NOTE — Assessment & Plan Note (Signed)
Patient declines blood work today but we will check a testosterone level when we bring him back in 6-8 weeks to recheck his thyroid levels

## 2016-10-24 NOTE — Progress Notes (Addendum)
Impression and Recommendations:    1. OAB (overactive bladder)   2. Urinary frequency   3. Hypothyroidism, unspecified type   4. Low libido   5. Fatigue, unspecified type   6. Obesity, Class II, BMI 35-39.9, isolated   7. Prediabetes   8. Hematochezia   9. Vitamin D deficiency   10. Hypertriglyceridemia   11. Screening for colon cancer     Vitamin D deficiency Pt taking 4K iu QD now.  Went from 25 to over 61.   He will take 5k IU QD  Hypothyroidism - Will start 84mg synthroid today  - reck TSH, free T4 6-8wks.   - Elevated TSH in lieu of patient's current symptoms:   the Synthroid 37.5 g was never started on pt despite my orders.  Talked to pt about this.  He would like to start the medicine, but does NOT want to take 1.5 pills or have to cut anything in half.  So we will start at 50 g QD after our discussion.  He understands potential risks assoc with overshoot.  Hypertriglyceridemia Significantly improve this last time went from over 300 down into the normal range.    Total patient to continue to cut back on his sodas-he only drinks diet now.     Obesity, Class II, BMI 35-39.9, isolated Explained to patient what BMI refers to, and what it means medically.    Told patient to think about it as a "medical risk stratification measurement" and how increasing BMI is associated with increasing risk/ or worsening state of various diseases such as hypertension, hyperlipidemia, diabetes, premature OA, depression etc.  American Heart Association guidelines for healthy diet, basically Mediterranean diet, and exercise guidelines of 30 minutes 5 days per week or more discussed in detail.  Health counseling performed.  All questions answered.   Prediabetes When last checked his A1c was 5.4 - this is much improved from 5.8 earlier.    He has cut back on sodas and will continue to do so.    Low-carb diet discussed  Urinary frequency Since 51yo - pt has always been a frequent  urinator.  10 times or more daily and nitely: 3 times.  Fatigue Likely secondary to thyroid issues.  We will reassess in 6-8 weeks.  Low libido Patient declines blood work today but we will check a testosterone level when we bring him back in 6-8 weeks to recheck his thyroid levels  Hematochezia Hemoccult blood was negative today.  OAB (overactive bladder) UA negative today.  patient has had symptoms ever since 51years old.  Discussed with patient that BPH in the context of a normal prostate exam and his current symptoms is much less likely as an etiology to his symptoms.  We'll treat with anti-spasmodics- trial of Ditropan XL.  He will let u[place]ore[place]now sooner than planned follow-up is having any concerns or problems with this medicine.  Screening for colon cancer Hemoccult blood negative today  patient due to go for his first colonoscopy on 11/08/2016.  The patient was counseled, risk factors were discussed, anticipatory guidance given.   New Prescriptions   CHOLECALCIFEROL (VITAMIN D3) 5000 UNITS TABS    5,000 IU OTC vitamin D3 daily.   LEVOTHYROXINE (SYNTHROID) 50 MCG TABLET    Take 1 tablet (50 mcg total) by mouth daily before breakfast.   OXYBUTYNIN (DITROPAN-XL) 5 MG 24 HR TABLET    Take 1 tablet (5 mg total) by mouth at bedtime. After one week, may  increase to 2 daily     Meds ordered this encounter  Medications  . Cholecalciferol (VITAMIN D3) 5000 units TABS    Sig: 5,000 IU OTC vitamin D3 daily.    Dispense:  90 tablet    Refill:  3  . levothyroxine (SYNTHROID) 50 MCG tablet    Sig: Take 1 tablet (50 mcg total) by mouth daily before breakfast.    Dispense:  90 tablet    Refill:  3  . oxybutynin (DITROPAN-XL) 5 MG 24 hr tablet    Sig: Take 1 tablet (5 mg total) by mouth at bedtime. After one week, may increase to 2 daily    Dispense:  180 tablet    Refill:  1     Discontinued Medications   CHOLECALCIFEROL (VITAMIN D3) 10000 UNITS TABS    Take 2 tablets by mouth  daily.     Orders Placed This Encounter  Procedures  . Testosterone  . T3, free  . T4, free  . TSH  . POC Hemoccult Bld/Stl (1-Cd Office Dx)  . POCT urinalysis dipstick   Gross side effects, risk and benefits, and alternatives of medications and treatment plan in general discussed with patient.  Patient is aware that all medications have potential side effects and we are unable to predict every side effect or drug-drug interaction that may occur.   Patient will call with any questions prior to using medication if they have concerns.  Expresses verbal understanding and consents to current therapy and treatment regimen.  No barriers to understanding were identified.  Red flag symptoms and signs discussed in detail.  Patient expressed understanding regarding what to do in case of emergency\urgent symptoms  Please see AVS handed out to patient at the end of our visit for further patient instructions/ counseling done pertaining to today's office visit.   Return for  reck TSH, free T4, Testosterone level  6-8wks, started  on synthroid; o/w q 4-63mochronic issues.     Note: This document was prepared using Dragon voice recognition software and may include unintentional dictation errors.   --------------------------------------------------------------------------------------------------------------------------------------------------------------------------------------------------------------------------------------------    Subjective:    CC:  Chief Complaint  Patient presents with  . decreased libido  . Nocturia    HPI: Ralph Reifsteckis a 51y.o. male [facility].   Decreased libido/ no energy:  Denies erectile dysfunction.  Lacks stamina / energy.  Patient states that his libido has been low and patient just doesn't feel like doing much of anything.  He tells me he's been under a lot of stress at work,  his sleep patterns are often not same every time because of work may keep him up later than usual couple days a week.   Excess urination: Ever since his been 51years old patient states he has frequency in urination.  This is daytime and nighttime.  He urinates about 2 times per night, max 3.  N 10 or more per day.  He denies excessive caffeine use and only has now one coffee in the a.m.  He tries to avoid caffeine otherwise.  He has never seen a doctor for this and has never been on medicines for it.  He goes approximately 10 or more times per day and has sense of urgency.  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  He denies any real incontinence with sneezing coughing etc.  No fever chills, no flank pain, no dysuria or hematuria.   All recent blood work that we ordered was reviewed with patient today.   Patient was counseled on all abnormalities and we discussed dietary and lifestyle changes that could help those values (also medications when appropriate).   Extensive health counseling performed and all patient's concerns/ questions were addressed.     Wt Readings from Last 3 Encounters:  10/24/16 248 lb 8 oz (112.7 kg)  10/10/16 247 lb 6.4 oz (112.2 kg)  09/26/16 243 lb 4.8 oz (110.4 kg)   BP Readings from Last 3 Encounters:  10/24/16 121/77  10/10/16 120/78  09/26/16 128/79   Pulse Readings from Last 3 Encounters:  10/24/16 73  10/10/16 76  09/26/16 84   BMI Readings from Last 3 Encounters:  10/24/16 38.35 kg/m  10/10/16 38.18 kg/m  09/26/16 37.54 kg/m     Patient Care Team    Relationship Specialty Notifications Start End  [name], DO PCP - General Family Medicine  09/26/16   Melida Quitter, MD Consulting Physician Otolaryngology  09/26/16   Shaylar Charmian Muff, MD Consulting Physician Allergy  09/26/16    Comment: [address] [place].      Patient Active Problem  List   Diagnosis Date Noted  . Hypothyroidism 10/24/2016    Priority: High  . OAB (overactive bladder) 10/24/2016    Priority: High  . Prediabetes 09/05/2015    Priority: High  . Hypertriglyceridemia 09/05/2015    Priority: High  . Obesity, Class II, BMI 35-39.9, isolated 07/26/2015    Priority: High  . Smoker 07/26/2015    Priority: High  . Fatigue 10/24/2016    Priority: Medium  . Alcohol use 09/26/2016    Priority: Medium  . History of substance abuse 09/26/2016    Priority: Medium  . Gout 07/26/2015    Priority: Medium  . Urinary frequency 10/24/2016    Priority: Low  . Low libido 09/26/2016    Priority: Low  . Environmental and seasonal allergies 09/26/2016    Priority: Low  . Family hx of colon cancer requiring screening colonoscopy 09/26/2016    Priority: Low  . Vitamin D deficiency 09/05/2015    Priority: Low  . Hematochezia 09/26/2016  . Screening for colon cancer 09/26/2016  . Recurrent sinusitis 07/26/2015  . Cervical disc disease 07/26/2015    Past Medical history, Surgical history, Family history, Social history, Allergies and Medications have been entered into the medical record, reviewed and changed as needed.    Current Meds  Medication Sig  . allopurinol (ZYLOPRIM) 300 MG tablet Take 1 tablet (300 mg total) by mouth daily.  . fexofenadine (ALLEGRA) 180 MG tablet Take 180 mg by mouth daily.  . fluticasone (FLONASE) 50 MCG/ACT nasal spray Place 2 sprays into the nose daily.  . [DISCONTINUED] Cholecalciferol (VITAMIN D3) 10000 units TABS Take 2 tablets by mouth daily.    Allergies:  No Known Allergies   Review of Systems: General:   Denies fever, chills, unexplained weight loss.  Optho/Auditory:   Denies visual changes, blurred vision/LOV Respiratory:   Denies wheeze, DOE more than baseline levels.  Cardiovascular:   Denies chest pain, palpitations, new onset peripheral edema  Gastrointestinal:   Denies nausea, vomiting, diarrhea, abd pain.    Genitourinary: Denies dysuria, flank pain or discharge from genitals.  Endocrine:     Denies hot or cold intolerance, polydipsia. Musculoskeletal:   Denies  unexplained myalgias, joint swelling, unexplained arthralgias, gait problems.  Skin:  Denies new onset rash, suspicious lesions Neurological:     Denies dizziness, unexplained weakness, numbness  Psychiatric/Behavioral:   Denies mood changes, suicidal or homicidal ideations, hallucinations   Objective:   Blood pressure 121/77, pulse 73, height 5' 7.5" (1.715 m), weight 248 lb 8 oz (112.7 kg). Body mass index is 38.35 kg/m. General:  Well Developed, well nourished, appropriate for stated age.  Neuro:  Alert and oriented,  extra-ocular muscles intact  HEENT:  Normocephalic, atraumatic, neck supple, no carotid bruits appreciated  Skin:  no gross rash, warm, pink. Cardiac:  RRR, S1 S2 Respiratory:  ECTA B/L and A/P, Not using accessory muscles, speaking in full sentences- unlabored. Vascular:  Ext warm, no cyanosis apprec.; cap RF less 2 sec. Prostate/ Rectal:  No rash, hemorrhoids or lesions, bilateral lobes smooth, not enlarged Psych:  No HI/SI, judgement and insight good, Euthymic mood. Full Affect.   Recent Results (from the past 2160 hour(s))  CBC     Status: None   Collection Time: 10/03/16  8:51 AM  Result Value Ref Range   WBC 8.2 3.4 - 10.8 x10E3/uL   RBC 4.66 4.14 - 5.80 x10E6/uL   Hemoglobin 15.0 13.0 - 17.7 g/dL   Hematocrit 44.1 37.5 - 51.0 %   MCV 95 79 - 97 fL   MCH 32.2 26.6 - 33.0 pg   MCHC 34.0 31.5 - 35.7 g/dL   RDW 13.0 12.3 - 15.4 %   Platelets 160 150 - 379 x10E3/uL  Comp Met (CMET)     Status: Abnormal   Collection Time: 10/03/16  8:51 AM  Result Value Ref Range   Glucose 110 (H) 65 - 99 mg/dL   BUN 18 6 - 24 mg/dL   Creatinine, Ser 0.86 0.76 - 1.27 mg/dL   GFR calc non Af Amer 100 >59 mL/min/1.73   GFR calc Af Amer 115 >59 mL/min/1.73   BUN/Creatinine Ratio 21 (H) 9 - 20   Sodium 139 134 - 144  mmol/L   Potassium 5.3 (H) 3.5 - 5.2 mmol/L   Chloride 96 96 - 106 mmol/L   CO2 30 (H) 18 - 29 mmol/L   Calcium 9.1 8.7 - 10.2 mg/dL   Total Protein 6.6 6.0 - 8.5 g/dL   Albumin 4.3 3.5 - 5.5 g/dL   Globulin, Total 2.3 1.5 - 4.5 g/dL   Albumin/Globulin Ratio 1.9 1.2 - 2.2   Bilirubin Total 0.4 0.0 - 1.2 mg/dL   Alkaline Phosphatase 52 39 - 117 IU/L   AST 22 0 - 40 IU/L   ALT 37 0 - 44 IU/L  Lipid panel     Status: Abnormal   Collection Time: 10/03/16  8:51 AM  Result Value Ref Range   Cholesterol, Total 184 100 - 199 mg/dL   Triglycerides 124 0 - 149 mg/dL   HDL 46 >39 mg/dL   VLDL Cholesterol Cal 25 5 - 40 mg/dL   LDL Calculated 113 (H) 0 - 99 mg/dL   Chol/HDL Ratio 4.0 0.0 - 5.0 ratio units    Comment:                                   T. Chol/HDL Ratio  Men  Women                               1/2 Avg.Risk  3.4    3.3                                   Avg.Risk  5.0    4.4                                2X Avg.Risk  9.6    7.1                                3X Avg.Risk 23.4   11.0   TSH     Status: Abnormal   Collection Time: 10/03/16  8:51 AM  Result Value Ref Range   TSH 5.010 (H) 0.450 - 4.500 uIU/mL  Vitamin D (25 hydroxy)     Status: None   Collection Time: 10/03/16  8:51 AM  Result Value Ref Range   Vit D, 25-Hydroxy 50.3 30.0 - 100.0 ng/mL    Comment: Vitamin D deficiency has been defined by the [place] practice guideline as a level of serum 25-OH vitamin D less than 20 ng/mL (1,2). The Endocrine Society went on to further define vitamin D insufficiency as a level between 21 and 29 ng/mL (2). 1. IOM (Institute of Medicine). 2010. Dietary reference    intakes for calcium and D. [place]: The    [employer]. 2. Holick MF, Binkley Olmito, Bischoff-Ferrari HA, et al.    Evaluation, treatment, and prevention of vitamin D    deficiency: an Endocrine Society clinical  practice    guideline. JCEM. 2011 Jul; 96(7):1911-30.   Uric acid     Status: None   Collection Time: 10/03/16  8:51 AM  Result Value Ref Range   Uric Acid 6.5 3.7 - 8.6 mg/dL    Comment:            Therapeutic target for gout patients: <6.0  Specimen status report     Status: None   Collection Time: 10/03/16  8:51 AM  Result Value Ref Range   specimen status report Comment     Comment: Written Authorization Written Authorization Written Authorization Received. Authorization received from DR [facility] [address] 10-12-2016 Logged by Benita Stabile   POCT HgB A1C     Status: Normal   Collection Time: 10/03/16  9:14 AM  Result Value Ref Range   Hemoglobin A1C 5.4   POC Hemoccult Bld/Stl (1-Cd Office Dx)     Status: Normal   Collection Time: 10/24/16 10:58 AM  Result Value Ref Range   Card #1 Date 10/24/16    Fecal Occult Blood, POC Negative Negative  POCT urinalysis dipstick     Status: Normal   Collection Time: 10/24/16 11:53 AM  Result Value Ref Range   Color, UA yellow    Clarity, UA clear    Glucose, UA negative    Bilirubin, UA negative    Ketones, UA negative    Spec Grav, UA 1.020    Blood, UA negative    pH, UA 6.0    Protein, UA negative    Urobilinogen, UA 0.2    Nitrite, UA  negative    Leukocytes, UA Negative Negative

## 2016-10-24 NOTE — Assessment & Plan Note (Signed)
When last checked his A1c was 5.4 - this is much improved from 5.8 earlier.    He has cut back on sodas and will continue to do so.    Low-carb diet discussed

## 2016-10-24 NOTE — Assessment & Plan Note (Signed)
>>  ASSESSMENT AND PLAN FOR HYPERTRIGLYCERIDEMIA WRITTEN ON 10/24/2016 11:42 AM BY OPALSKI, DEBORAH, DO  Significantly improve this last time went from over 300 down into the normal range.    Total patient to continue to cut back on his sodas-he only drinks diet now.

## 2016-10-24 NOTE — Assessment & Plan Note (Signed)
Likely secondary to thyroid issues.  We will reassess in 6-8 weeks.

## 2016-10-24 NOTE — Assessment & Plan Note (Signed)
Hemoccult blood negative today  patient due to go for his first colonoscopy on 11/08/2016.

## 2016-10-24 NOTE — Patient Instructions (Addendum)
--   4 your LDL, it's only slightly elevated so I like to just to work on decreasing her amount of saturated and Transfats that you take in.  Also exercising to a goal of 30 minutes daily of moderate intensity will help improve those as well.  - start your synthroid- 72mcg QD.   F/up 6-8 wks.  For labs- including Testosterone.         Hypothyroidism Hypothyroidism is a disorder of the thyroid. The thyroid is a large gland that is located in the lower front of the neck. The thyroid releases hormones that control how the body works. With hypothyroidism, the thyroid does not make enough of these hormones. What are the causes? Causes of hypothyroidism may include:  Viral infections.  Pregnancy.  Your own defense system (immune system) attacking your thyroid.  Certain medicines.  Birth defects.  Past radiation treatments to your head or neck.  Past treatment with radioactive iodine.  Past surgical removal of part or all of your thyroid.  Problems with the gland that is located in the center of your brain (pituitary). What are the signs or symptoms? Signs and symptoms of hypothyroidism may include:  Feeling as though you have no energy (lethargy).  Inability to tolerate cold.  Weight gain that is not explained by a change in diet or exercise habits.  Dry skin.  Coarse hair.  Menstrual irregularity.  Slowing of thought processes.  Constipation.  Sadness or depression. How is this diagnosed? Your health care provider may diagnose hypothyroidism with blood tests and ultrasound tests. How is this treated? Hypothyroidism is treated with medicine that replaces the hormones that your body does not make. After you begin treatment, it may take several weeks for symptoms to go away. Follow these instructions at home:  Take medicines only as directed by your health care provider.  If you start taking any new medicines, tell your health care provider.  Keep all follow-up visits  as directed by your health care provider. This is important. As your condition improves, your dosage needs may change. You will need to have blood tests regularly so that your health care provider can watch your condition. Contact a health care provider if:  Your symptoms do not get better with treatment.  You are taking thyroid replacement medicine and:  You sweat excessively.  You have tremors.  You feel anxious.  You lose weight rapidly.  You cannot tolerate heat.  You have emotional swings.  You have diarrhea.  You feel weak. Get help right away if:  You develop chest pain.  You develop an irregular heartbeat.  You develop a rapid heartbeat. This information is not intended to replace advice given to you by your health care provider. Make sure you discuss any questions you have with your health care provider. Document Released: 09/16/2005 Document Revised: 02/22/2016 Document Reviewed: 02/01/2014 Elsevier Interactive Patient Education  2017 [employer].

## 2016-10-24 NOTE — Assessment & Plan Note (Addendum)
-   Will start 47mcg synthroid today  - reck TSH, free T4 6-8wks.   - Elevated TSH in lieu of patient's current symptoms:   the Synthroid 37.5 g was never started on pt despite my orders.  Talked to pt about this.  He would like to start the medicine, but does NOT want to take 1.5 pills or have to cut anything in half.  So we will start at 50 g QD after our discussion.  He understands potential risks assoc with overshoot.

## 2016-10-24 NOTE — Addendum Note (Signed)
Addended by: [name] on: 10/24/2016 01:27 PM   Modules accepted: Level of Service

## 2016-10-24 NOTE — Assessment & Plan Note (Signed)
Significantly improve this last time went from over 300 down into the normal range.    Total patient to continue to cut back on his sodas-he only drinks diet now.

## 2016-10-24 NOTE — Assessment & Plan Note (Signed)
Since 51 yo - pt has always been a frequent urinator.  10 times or more daily and nitely: 3 times.

## 2016-10-24 NOTE — Assessment & Plan Note (Addendum)
Pt taking 4K iu QD now.  Went from 25 to over 62.   He will take 5k IU QD

## 2016-10-24 NOTE — Assessment & Plan Note (Signed)

## 2016-10-24 NOTE — Assessment & Plan Note (Addendum)
UA negative today.  patient has had symptoms ever since 51 years old.  Discussed with patient that BPH in the context of a normal prostate exam and his current symptoms is much less likely as an etiology to his symptoms.  We'll treat with anti-spasmodics- trial of Ditropan XL.  He will let us know sooner than planned follow-up is having any concerns or problems with this medicine.

## 2016-10-25 ENCOUNTER — Encounter: Payer: Self-pay | Admitting: Internal Medicine

## 2016-10-31 ENCOUNTER — Encounter: Payer: Self-pay | Admitting: Allergy

## 2016-10-31 ENCOUNTER — Ambulatory Visit (INDEPENDENT_AMBULATORY_CARE_PROVIDER_SITE_OTHER): Payer: BLUE CROSS/BLUE SHIELD | Admitting: Allergy

## 2016-10-31 VITALS — BP 116/72 | HR 68 | Temp 97.6°F | Resp 16 | Ht 67.0 in | Wt 247.0 lb

## 2016-10-31 DIAGNOSIS — J310 Chronic rhinitis: Secondary | ICD-10-CM

## 2016-10-31 MED ORDER — IPRATROPIUM BROMIDE 0.03 % NA SOLN: 2.0000 | Freq: Three times a day (TID) | NASAL | 5 refills | Status: DC

## 2016-10-31 MED ORDER — OLOPATADINE HCL 0.7 % OP SOLN: 1.0000 [drp] | Freq: Every day | OPHTHALMIC | 5 refills | Status: DC | PRN

## 2016-10-31 NOTE — Progress Notes (Signed)
New Patient Note  RE: Ralph Mack MRN: [id number] DOB: [birth year] Date of Office Visit: 10/31/2016  Referring provider: Adline Potter, MD Primary care provider: [name], DO  Chief Complaint: Sinus issues  History of present illness: Ralph Mack is a 51 y.o. male presenting today for consultation for allergy symptoms.    He was recently evaluated by ENT (dr. Redmond Baseman) who recommended he have allergy testing performed.    4-5 years ago he reports he got the flu in July.  He was treated with antibiotics as an outpatient.  A month later he developed scratchy throat, nasal congestion, itchy eyes and he saw his PCP who diagnosed with sinus infection and took antibiotic and cleared his symptoms.   98mo later he had similar symptoms and did another antibiotic course.  This became a pattern and he states in the past 5 years he had has at least 15 sinus infections treated antibiotics.    His current symptoms  include nasal congestion, itchy/dry/watery eyes, raspy voice, sneezing, ear itchiness, PND (usually in morning), headaches.  Symptoms are worse in the summer but symptoms are all year round.  He tries to  remember to wear mask for yardwork.   He has used eye drops, nasal steroid sprays,and antihistamines. He stopped all of his allergy medication and has had worsening of his symptoms over the past several days.   He has had PNA 3 times in lifetime as well as pluerisy in 2001.    He also has itchiness mostly of his arms.  No history of eczema, asthma, food allergy.  He was in the past year found to be hypothyroidism and is on replacement therapy with Synthroid.  Review of systems: Review of Systems  Constitutional: Negative for chills, fever and malaise/fatigue.  HENT: Positive for congestion and sinus pain. Negative for ear pain, nosebleeds and sore throat.   Eyes: Positive for redness. Negative for discharge.  Respiratory: Negative for cough, shortness of breath and wheezing.     Cardiovascular: Negative for chest pain.  Gastrointestinal: Negative for abdominal pain, heartburn, nausea and vomiting.  Genitourinary: Positive for frequency.  Skin: Positive for itching. Negative for rash.    All other systems negative unless noted above in HPI  Past medical history: Past Medical History:  Diagnosis Date  . Allergy   . Arthritis   . Hypothyroidism   . Prediabetes   . Recurrent upper respiratory infection (URI)     Past surgical history: Past Surgical History:  Procedure Laterality Date  . NO PAST SURGERIES      Family history:  Family History  Problem Relation Age of Onset  . Asthma Mother   . COPD Mother   . Cancer Mother     skin  . Heart disease Father   . Parkinson's disease Father   . Depression Daughter   . Diabetes Paternal Grandfather   . Kidney disease Paternal Grandfather     Social history: She lives in a house with carpeting with electric heating and central cooling. There are 3 dogs in the room. There is no concern for water damage or mildew in the home. He works as a Administrator. He is a current smoker of 1 pack per day for the past 35 years.   Medication List: Allergies as of 10/31/2016   No Known Allergies     Medication List       Accurate as of 10/31/16 12:56 PM. Always use your most recent med list.  allopurinol 300 MG tablet Commonly known as:  ZYLOPRIM Take 1 tablet (300 mg total) by mouth daily.   fexofenadine 180 MG tablet Commonly known as:  ALLEGRA Take 180 mg by mouth daily.   fluticasone 50 MCG/ACT nasal spray Commonly known as:  FLONASE Place 2 sprays into the nose daily.   levothyroxine 50 MCG tablet Commonly known as:  SYNTHROID Take 1 tablet (50 mcg total) by mouth daily before breakfast.   oxybutynin 5 MG 24 hr tablet Commonly known as:  DITROPAN-XL Take 1 tablet (5 mg total) by mouth at bedtime. After one week, may increase to 2 daily   Vitamin D3 5000 units Tabs 5,000 IU OTC  vitamin D3 daily.       Known medication allergies: No Known Allergies   Physical examination: Blood pressure 116/72, pulse 68, temperature 97.6 F (36.4 C), temperature source Oral, resp. rate 16, height 5\' 7"  (1.702 m), weight 247 lb (112 kg), SpO2 97 %.  General: Alert, interactive, in no acute distress. HEENT: TMs pearly gray, turbinates mildly edematous without discharge, post-pharynx non erythematous. Neck: Supple without lymphadenopathy. Lungs: Clear to auscultation without wheezing, rhonchi or rales. {no increased work of breathing. CV: Normal S1, S2 without murmurs. Abdomen: Nondistended, nontender. Skin: Warm and dry, without lesions or rashes. Extremities:  No clubbing, cyanosis or edema. Neuro:   Grossly intact.  Diagnositics/Labs:  Allergy testing: Histamine was minimally reactive. He did not exhibit any sensitivity on skin prick testing or intradermal testing  Allergy testing results were read and interpreted by provider, documented by clinical staff.   Assessment and plan:   Chronic rhinitis and conjunctivitis Allergy testing today was negative.  Will obtain environmental allergen panel via bloodwork.  We will call you with the results.   Resume use of antihistamine (if you had improvement in symptoms) like Zyrtec 10mg , Allegra 180mg  or Xyzal 5mg  all are over-the-counter   Resume use of nasal steroid spray (like Flonase, Rhinocort, nasocort) use 2 sprays each nostril daily.  Nasal steroid spray can be beneficial in nonallergic rhinitis  Trial nasal Atrovent 0.03%  2 sprays each nostril up to 3 times a day for nasal drainage/drip  Use Pazeo eye drop 1 drop each eye for itchy, watery red eyes  Itching may be due to hypothyroidism and should continue to improve once your thyroid disease is under good control.  Use antihistamine as above and trial Cristino Martes  Will give sample of karbinal ER 4mg /71ml take 78ml every 12 hours as needed for relief of nasal  congestion/drainage.  Advised to trial this medication when he is off duty as there may be a sedation component however it has less sedation than Benadryl and other first-generation antihistamines.  If he does have nonallergic rhinitis Cristino Martes has an indication for this.   Follow-up  4-6 months  I appreciate the opportunity to take part in Ralph Mack's care. Please do not hesitate to contact me with questions.  Sincerely,   Prudy Feeler, MD Allergy/Immunology [facility]

## 2016-10-31 NOTE — Patient Instructions (Addendum)
Allergy testing today was negative.  Will obtain environmental allergen panel via bloodwork.  We will call you with the results.   Resume use of antihistamine (if you had improvement in symptoms) like Zyrtec 10mg , Allegra 180mg  or Xyzal 5mg  all are over-the-counter   Resume use of nasal steroid spray (like Flonase, Rhinocort, nasocort) use 2 sprays each nostril daily  Trial nasal Atrovent 0.03%  2 sprays each nostril up to 3 times a day for nasal drainage/drip  Use Pazeo eye drop 1 drop each eye for itchy, watery red eyes  Itching may be due to hypothyroidism and should continue to improve once your thyroid disease is under good control  Will give sample of karbinal ER 4mg /59ml take 58ml every 12 hours as needed for relief of nasal congestion/drainage    Follow-up  4-6 months

## 2016-11-01 LAB — CP584 ZONE 3
Allergen, A. alternata, m6: <0.1 kU/L
Allergen, Black Locust, Acacia9: <0.1 kU/L
Allergen, Cedar tree, t12: <0.1 kU/L
Allergen, Comm Silver Birch, t9: <0.1 kU/L
Allergen, P. notatum, m1: <0.1 kU/L
Allergen, S. Botryosum, m10: <0.1 kU/L
Cat Dander: <0.1 kU/L
Plantain: <0.1 kU/L
Rough Pigweed  IgE: <0.1 kU/L

## 2016-11-04 ENCOUNTER — Encounter: Payer: Self-pay | Admitting: Allergy

## 2016-11-05 ENCOUNTER — Telehealth: Payer: Self-pay | Admitting: Allergy

## 2016-11-05 NOTE — Telephone Encounter (Signed)
Patient was given results and he doesn't understand them. Needs them explained. He said Museum/gallery conservator called him.

## 2016-11-05 NOTE — Telephone Encounter (Signed)
See result note.  

## 2016-11-08 ENCOUNTER — Encounter: Payer: Self-pay | Admitting: Internal Medicine

## 2016-11-08 ENCOUNTER — Ambulatory Visit (AMBULATORY_SURGERY_CENTER): Payer: BLUE CROSS/BLUE SHIELD | Admitting: Internal Medicine

## 2016-11-08 VITALS — BP 150/93 | HR 62 | Temp 98.4°F | Resp 20 | Ht 67.0 in | Wt 247.0 lb

## 2016-11-08 DIAGNOSIS — K625 Hemorrhage of anus and rectum: Secondary | ICD-10-CM | POA: Diagnosis present

## 2016-11-08 DIAGNOSIS — D123 Benign neoplasm of transverse colon: Secondary | ICD-10-CM | POA: Diagnosis not present

## 2016-11-08 DIAGNOSIS — K648 Other hemorrhoids: Secondary | ICD-10-CM

## 2016-11-08 MED ORDER — SODIUM CHLORIDE 0.9 % IV SOLN
500.0000 mL | INTRAVENOUS | Status: DC
Start: 2016-11-08 — End: 2019-04-07

## 2016-11-08 NOTE — Progress Notes (Signed)
Report to PACU, RN, vss, BBS= Clear.  

## 2016-11-08 NOTE — Progress Notes (Signed)
Called to room to assist during endoscopic procedure.  Patient ID and intended procedure confirmed with present staff. Received instructions for my participation in the procedure from the performing physician.  

## 2016-11-08 NOTE — Patient Instructions (Addendum)
The bleeding is coming from hemorrhoids. I can fix these if desired - using an in-office rubber banding technique.  I did remove one small polyp that looks benign.  I will let you know pathology results and when to have another routine colonoscopy by mail.  I appreciate the opportunity to care for you. Gatha Mayer, MD, FACG   YOU HAD AN ENDOSCOPIC PROCEDURE TODAY AT [facility]:   Refer to the procedure report that was given to you for any specific questions about what was found during the examination.  If the procedure report does not answer your questions, please call your gastroenterologist to clarify.  If you requested that your care partner not be given the details of your procedure findings, then the procedure report has been included in a sealed envelope for you to review at your convenience later.  YOU SHOULD EXPECT: Some feelings of bloating in the abdomen. Passage of more gas than usual.  Walking can help get rid of the air that was put into your GI tract during the procedure and reduce the bloating. If you had a lower endoscopy (such as a colonoscopy or flexible sigmoidoscopy) you may notice spotting of blood in your stool or on the toilet paper. If you underwent a bowel prep for your procedure, you may not have a normal bowel movement for a few days.  Please Note:  You might notice some irritation and congestion in your nose or some drainage.  This is from the oxygen used during your procedure.  There is no need for concern and it should clear up in a day or so.  SYMPTOMS TO REPORT IMMEDIATELY:   Following lower endoscopy (colonoscopy or flexible sigmoidoscopy):  Excessive amounts of blood in the stool  Significant tenderness or worsening of abdominal pains  Swelling of the abdomen that is new, acute  Fever of 100F or higher    For urgent or emergent issues, a gastroenterologist can be reached at any hour by calling [phone].   DIET:  We  do recommend a small meal at first, but then you may proceed to your regular diet.  Drink plenty of fluids but you should avoid alcoholic beverages for 24 hours.  ACTIVITY:  You should plan to take it easy for the rest of today and you should NOT DRIVE or use heavy machinery until tomorrow (because of the sedation medicines used during the test).    FOLLOW UP: Our staff will call the number listed on your records the next business day following your procedure to check on you and address any questions or concerns that you may have regarding the information given to you following your procedure. If we do not reach you, we will leave a message.  However, if you are feeling well and you are not experiencing any problems, there is no need to return our call.  We will assume that you have returned to your regular daily activities without incident.  If any biopsies were taken you will be contacted by phone or by letter within the next 1-3 weeks.  Please call us at [phone] if you have not heard about the biopsies in 3 weeks.    SIGNATURES/CONFIDENTIALITY: You and/or your care partner have signed paperwork which will be entered into your electronic medical record.  These signatures attest to the fact that that the information above on your After Visit Summary has been reviewed and is understood.  Full responsibility of the confidentiality of this  discharge information lies with you and/or your care-partner.   INFORMATION ON HEMORRHOIDS,HEMORRHOID BANDING,7POLYPS GIVEN TO YOU TODAY

## 2016-11-08 NOTE — Op Note (Signed)
[facility] Patient Name: Ralph Mack Procedure Date: 11/08/2016 2:05 PM MRN: [id number] Endoscopist: Gatha Mayer , MD Age: 51 Referring MD:  Date of Birth: 24-Jan-1966 Gender: Male Account #: [id number] Procedure:                Colonoscopy Indications:              Rectal bleeding Medicines:                Propofol per Anesthesia, Monitored Anesthesia Care Procedure:                Pre-Anesthesia Assessment:                           - Prior to the procedure, a History and Physical                            was performed, and patient medications and                            allergies were reviewed. The patient's tolerance of                            previous anesthesia was also reviewed. The risks                            and benefits of the procedure and the sedation                            options and risks were discussed with the patient.                            All questions were answered, and informed consent                            was obtained. Prior Anticoagulants: The patient has                            taken no previous anticoagulant or antiplatelet                            agents. ASA Grade Assessment: II - A patient with                            mild systemic disease. After reviewing the risks                            and benefits, the patient was deemed in                            satisfactory condition to undergo the procedure.                           After obtaining informed consent, the colonoscope  was passed under direct vision. Throughout the                            procedure, the patient's blood pressure, pulse, and                            oxygen saturations were monitored continuously. The                            Model CF-HQ190L [id number]) scope was introduced                            through the anus and advanced to the the cecum,                            identified by  appendiceal orifice and ileocecal                            valve. The colonoscopy was performed without                            difficulty. The patient tolerated the procedure                            well. The quality of the bowel preparation was                            excellent. The bowel preparation used was Miralax.                            The ileocecal valve, appendiceal orifice, and                            rectum were photographed. Scope In: 2:10:40 PM Scope Out: 2:22:57 PM Scope Withdrawal Time: 0 hours 10 minutes 0 seconds  Total Procedure Duration: 0 hours 12 minutes 17 seconds  Findings:                 The perianal and digital rectal examinations were                            normal. Pertinent negatives include normal prostate                            (size, shape, and consistency).                           A 7 mm polyp was found in the transverse colon. The                            polyp was sessile. The polyp was removed with a                            cold snare. Resection and retrieval were complete.  Verification of patient identification for the                            specimen was done. Estimated blood loss was minimal.                           Internal hemorrhoids were found.                           The exam was otherwise without abnormality on                            direct and retroflexion views. Complications:            No immediate complications. Estimated Blood Loss:     Estimated blood loss was minimal. Impression:               - One 7 mm polyp in the transverse colon, removed                            with a cold snare. Resected and retrieved.                           - Internal hemorrhoids.                           - The examination was otherwise normal on direct                            and retroflexion views. Recommendation:           - Patient has a contact number available for                             emergencies. The signs and symptoms of potential                            delayed complications were discussed with the                            patient. Return to normal activities tomorrow.                            Written discharge instructions were provided to the                            patient.                           - Resume previous diet.                           - Continue present medications.                           - Repeat colonoscopy is recommended. The  colonoscopy date will be determined after pathology                            results from today's exam become available for                            review.                           - hemorrhoid banding can be arranged if desired Gatha Mayer, MD 11/08/2016 2:29:58 PM This report has been signed electronically.

## 2016-11-11 ENCOUNTER — Telehealth: Payer: Self-pay | Admitting: *Deleted

## 2016-11-11 NOTE — Telephone Encounter (Signed)
  Follow up Call-  Call back number 11/08/2016  Post procedure Call Back phone  # [phone]  Permission to leave phone message Yes     Patient questions:  Do you have a fever, pain , or abdominal swelling? No. Pain Score  0 *  Have you tolerated food without any problems? Yes.    Have you been able to return to your normal activities? Yes.    Do you have any questions about your discharge instructions: Diet   No. Medications  No. Follow up visit  No.  Do you have questions or concerns about your Care? No.  Actions: * If pain score is 4 or above: No action needed, pain <4.

## 2016-11-13 ENCOUNTER — Encounter: Payer: Self-pay | Admitting: Internal Medicine

## 2016-11-13 DIAGNOSIS — Z860100 Personal history of colon polyps, unspecified: Secondary | ICD-10-CM | POA: Insufficient documentation

## 2016-11-13 HISTORY — DX: Personal history of colon polyps, unspecified: Z86.0100

## 2016-11-13 HISTORY — DX: Personal history of colonic polyps: Z86.010

## 2016-11-13 NOTE — Progress Notes (Signed)
7 mm ssp/a - recall 2023 My Chart letter

## 2016-11-14 ENCOUNTER — Encounter: Payer: Self-pay | Admitting: Family Medicine

## 2016-12-12 ENCOUNTER — Other Ambulatory Visit (INDEPENDENT_AMBULATORY_CARE_PROVIDER_SITE_OTHER): Payer: BLUE CROSS/BLUE SHIELD

## 2016-12-12 DIAGNOSIS — E039 Hypothyroidism, unspecified: Secondary | ICD-10-CM

## 2016-12-12 DIAGNOSIS — R5383 Other fatigue: Secondary | ICD-10-CM

## 2016-12-13 LAB — T4, FREE: Free T4: 1.22 ng/dL (ref 0.82–1.77)

## 2016-12-13 LAB — TESTOSTERONE: Testosterone: 186 ng/dL — ABNORMAL LOW (ref 264–916)

## 2016-12-13 LAB — TSH: TSH: 1.68 u[IU]/mL (ref 0.450–4.500)

## 2016-12-13 LAB — T3, FREE: T3 FREE: 3.1 pg/mL (ref 2.0–4.4)

## 2016-12-23 NOTE — Assessment & Plan Note (Signed)
Pt declines rectal today.  Please give pt hemoccult cards for home use.   Referral to GI for scope

## 2016-12-23 NOTE — Assessment & Plan Note (Addendum)
Rec wt loss.   Declines nutrition referral  Will obtain labs to r/o Pre-DM/ DM, HLD/ TG, Tsh d/o etc near future.    Pt told to come in for fasting bldwrk prior to next appt.

## 2016-12-23 NOTE — Assessment & Plan Note (Signed)
Referral for colonoscopy sent today.  Counseling done

## 2016-12-23 NOTE — Assessment & Plan Note (Signed)
>>  ASSESSMENT AND PLAN FOR OBESITY, CLASS II, BMI 35-39.9, ISOLATED WRITTEN ON 12/23/2016 11:12 AM BY OPALSKI, DEBORAH, DO  Rec wt loss.   Declines nutrition referral  Will obtain labs to r/o Pre-DM/ DM, HLD/ TG, Tsh d/o etc near future.    Pt told to come in for fasting bldwrk prior to next appt.

## 2017-10-02 ENCOUNTER — Other Ambulatory Visit: Payer: Self-pay | Admitting: Family Medicine

## 2017-10-02 ENCOUNTER — Telehealth: Payer: Self-pay | Admitting: Family Medicine

## 2017-10-02 DIAGNOSIS — E039 Hypothyroidism, unspecified: Secondary | ICD-10-CM

## 2017-10-02 NOTE — Telephone Encounter (Signed)
Pt called to request Rx refill on:  levothyroxine (SYNTHROID) 50 MCG tablet [[id number]]  Order Details  Dose: 50 mcg Route: Oral Frequency: Daily before breakfast  Dispense Quantity: 90 tablet Refills: 3 Fills remaining: --        Sig: Take 1 tablet (50 mcg total) by mouth daily before breakfast.     -- Patient says doesn't know if OV required in order for PCP to honor refill request--- pls call patient if there are any questions @ [phone].  ---glh

## 2017-10-02 NOTE — Telephone Encounter (Signed)
Patient is coming in next week for lab work and OV 1 week later.  Patient states that he has enough medication to wait until that appointment. MPulliam, CMA/RT(R)

## 2017-10-02 NOTE — Telephone Encounter (Signed)
Spoke to the patient - notified that he needs labs and an OV - patient is coming in Monday for labs and OV a wk later patient states that he has enough medications to make it to that appointment. MPulliam, CMA/RT(R)

## 2017-10-06 ENCOUNTER — Other Ambulatory Visit (INDEPENDENT_AMBULATORY_CARE_PROVIDER_SITE_OTHER): Payer: BLUE CROSS/BLUE SHIELD

## 2017-10-06 DIAGNOSIS — K921 Melena: Secondary | ICD-10-CM

## 2017-10-06 DIAGNOSIS — R7303 Prediabetes: Secondary | ICD-10-CM

## 2017-10-06 DIAGNOSIS — E781 Pure hyperglyceridemia: Secondary | ICD-10-CM

## 2017-10-06 DIAGNOSIS — E669 Obesity, unspecified: Secondary | ICD-10-CM

## 2017-10-06 DIAGNOSIS — E039 Hypothyroidism, unspecified: Secondary | ICD-10-CM

## 2017-10-06 DIAGNOSIS — R5383 Other fatigue: Secondary | ICD-10-CM

## 2017-10-06 DIAGNOSIS — E559 Vitamin D deficiency, unspecified: Secondary | ICD-10-CM

## 2017-10-07 LAB — CBC WITH DIFFERENTIAL/PLATELET
BASOS ABS: 0 10*3/uL (ref 0.0–0.2)
BASOS: 1 %
EOS (ABSOLUTE): 0.3 10*3/uL (ref 0.0–0.4)
Eos: 4 %
HEMOGLOBIN: 15.5 g/dL (ref 13.0–17.7)
Hematocrit: 45.4 % (ref 37.5–51.0)
IMMATURE GRANS (ABS): 0 10*3/uL (ref 0.0–0.1)
Immature Granulocytes: 0 %
LYMPHS ABS: 2.3 10*3/uL (ref 0.7–3.1)
Lymphs: 31 %
MCH: 31.8 pg (ref 26.6–33.0)
MCHC: 34.1 g/dL (ref 31.5–35.7)
MCV: 93 fL (ref 79–97)
MONOCYTES: 9 %
Monocytes Absolute: 0.7 10*3/uL (ref 0.1–0.9)
NEUTROS ABS: 4.1 10*3/uL (ref 1.4–7.0)
Neutrophils: 55 %
Platelets: 183 10*3/uL (ref 150–379)
RBC: 4.88 x10E6/uL (ref 4.14–5.80)
RDW: 13.3 % (ref 12.3–15.4)
WBC: 7.4 10*3/uL (ref 3.4–10.8)

## 2017-10-07 LAB — COMPREHENSIVE METABOLIC PANEL
ALBUMIN: 4.5 g/dL (ref 3.5–5.5)
ALK PHOS: 60 IU/L (ref 39–117)
ALT: 30 IU/L (ref 0–44)
AST: 21 IU/L (ref 0–40)
Albumin/Globulin Ratio: 2 (ref 1.2–2.2)
BUN / CREAT RATIO: 17 (ref 9–20)
BUN: 15 mg/dL (ref 6–24)
Bilirubin Total: 0.2 mg/dL (ref 0.0–1.2)
CO2: 28 mmol/L (ref 20–29)
Calcium: 9.4 mg/dL (ref 8.7–10.2)
Chloride: 97 mmol/L (ref 96–106)
Creatinine, Ser: 0.89 mg/dL (ref 0.76–1.27)
GFR calc Af Amer: 113 mL/min/{1.73_m2} (ref >59)
GFR, EST NON AFRICAN AMERICAN: 98 mL/min/{1.73_m2} (ref >59)
GLOBULIN, TOTAL: 2.3 g/dL (ref 1.5–4.5)
Glucose: 101 mg/dL — ABNORMAL HIGH (ref 65–99)
POTASSIUM: 5.3 mmol/L — AB (ref 3.5–5.2)
SODIUM: 141 mmol/L (ref 134–144)
Total Protein: 6.8 g/dL (ref 6.0–8.5)

## 2017-10-07 LAB — LIPID PANEL
CHOLESTEROL TOTAL: 178 mg/dL (ref 100–199)
Chol/HDL Ratio: 3.7 ratio (ref 0.0–5.0)
HDL: 48 mg/dL (ref >39)
LDL CALC: 73 mg/dL (ref 0–99)
TRIGLYCERIDES: 284 mg/dL — AB (ref 0–149)
VLDL CHOLESTEROL CAL: 57 mg/dL — AB (ref 5–40)

## 2017-10-07 LAB — T4, FREE: Free T4: 1.21 ng/dL (ref 0.82–1.77)

## 2017-10-07 LAB — VITAMIN D 25 HYDROXY (VIT D DEFICIENCY, FRACTURES): VIT D 25 HYDROXY: 47.4 ng/mL (ref 30.0–100.0)

## 2017-10-07 LAB — HEMOGLOBIN A1C
Est. average glucose Bld gHb Est-mCnc: 123 mg/dL
Hgb A1c MFr Bld: 5.9 % — ABNORMAL HIGH (ref 4.8–5.6)

## 2017-10-07 LAB — TSH: TSH: 2.14 u[IU]/mL (ref 0.450–4.500)

## 2017-10-10 NOTE — Telephone Encounter (Signed)
Pt has appt 10/14/17.  Per Lanier Prude, CMA/RT Dr. Raliegh Scarlet will address at [place].  Charyl Bigger, CMA

## 2017-10-12 ENCOUNTER — Encounter: Payer: Self-pay | Admitting: Family Medicine

## 2017-10-14 ENCOUNTER — Ambulatory Visit (INDEPENDENT_AMBULATORY_CARE_PROVIDER_SITE_OTHER): Payer: BLUE CROSS/BLUE SHIELD | Admitting: Family Medicine

## 2017-10-14 ENCOUNTER — Encounter: Payer: Self-pay | Admitting: Family Medicine

## 2017-10-14 VITALS — BP 148/94 | HR 79 | Ht 67.0 in | Wt 260.0 lb

## 2017-10-14 DIAGNOSIS — E559 Vitamin D deficiency, unspecified: Secondary | ICD-10-CM

## 2017-10-14 DIAGNOSIS — E039 Hypothyroidism, unspecified: Secondary | ICD-10-CM | POA: Diagnosis not present

## 2017-10-14 DIAGNOSIS — E781 Pure hyperglyceridemia: Secondary | ICD-10-CM

## 2017-10-14 DIAGNOSIS — R030 Elevated blood-pressure reading, without diagnosis of hypertension: Secondary | ICD-10-CM | POA: Insufficient documentation

## 2017-10-14 DIAGNOSIS — F1020 Alcohol dependence, uncomplicated: Secondary | ICD-10-CM

## 2017-10-14 DIAGNOSIS — R6882 Decreased libido: Secondary | ICD-10-CM | POA: Diagnosis not present

## 2017-10-14 DIAGNOSIS — R7303 Prediabetes: Secondary | ICD-10-CM | POA: Diagnosis not present

## 2017-10-14 DIAGNOSIS — E669 Obesity, unspecified: Secondary | ICD-10-CM

## 2017-10-14 DIAGNOSIS — R5383 Other fatigue: Secondary | ICD-10-CM | POA: Diagnosis not present

## 2017-10-14 MED ORDER — LEVOTHYROXINE SODIUM 50 MCG PO TABS: 50.0000 ug | ORAL_TABLET | Freq: Every day | ORAL | 1 refills | Status: DC

## 2017-10-14 MED ORDER — FISH OIL 1200 MG PO CAPS: ORAL_CAPSULE | ORAL | Status: DC

## 2017-10-14 NOTE — Progress Notes (Signed)
Assessment and plan:  1. Low libido   2. Fatigue, unspecified type   3. Uncomplicated alcohol dependence ([place])   4. Hypertriglyceridemia   5. Hypothyroidism, unspecified type   6. Vitamin D deficiency   7. Obesity, Class II, BMI 35-39.9, isolated   8. Prediabetes   9. Elevated blood pressure reading- w/o dx HTN   10. White coat syndrome without diagnosis of hypertension     1. Low libido- We are checking his testosterone today. Will call or alert patient on MyChart for his results and we may meet to discuss his results and future plans. Otherwise, follow up in 4 months.   2. Hypothyroidism- From previous visit, we started 18mcg synthroid. His blood results are WNL. Pt is tolerating his medications well and is stable at this time.   3. Vitamin D deficiency. Pt is taking 5000 IUs daily. He is tolerating this well and stable. He will continue this supplement with no changes.  4. Fatigue- Recommended getting more sleep, drinking copious amounts of water, and exercising daily. Pt is taking 5000 IUs daily of vitamin D and pt will continue taking his supplements.   5. Uncomplicated alcohol dependence- Recommended reducing alcohol intake. Pt agrees to make a health goal of no alcohol for 90 days.  6. Hypertriglyceridemia: Take fish oils 2 PO BID (1200mg  tablets). Continue cutting back on drinking sodas as well as alcohol. Dietary and exercise guidelines discussed with patient. Recommended pt to reduce intake of saturated, trans fats and fatty carbohydrates. Handouts provided if desired. Recheck in 6 months.   7. Prediabetes- When checked last, pt's A1c is 5.4, it is now 5.9.Dietary and exercise guidelines discussed with patient. Recommended pt to reduce intake of saturated, trans fats and fatty carbohydrates. Handouts provided if desired. Recheck A1c in 6 months. Pt will reduce alcohol for 90 days as a health goal. 8. Obesity,  Class II, BMI 35-39.9, isolated Dietary and exercise guidelines discussed with patient. Recommended pt to reduce intake of saturated, trans fats and fatty carbohydrates. Handouts provided if desired.    Health goals: for 90 days, no alcohol  Health counseling performed.  All questions answered.  The patient was counseled, risk factors were discussed, anticipatory guidance given.  Education and routine counseling performed. Handouts provided.    Orders Placed This Encounter  Procedures  . Testosterone  . Hepatitis C antibody  . HIV antibody  . Hemoglobin A1c  . Lipid panel    Meds ordered this encounter  Medications  . levothyroxine (SYNTHROID) 50 MCG tablet    Sig: Take 1 tablet (50 mcg total) by mouth daily before breakfast.    Dispense:  90 tablet    Refill:  1  . Omega-3 Fatty Acids (FISH OIL) 1200 MG CAPS    Sig: 2 p.o. twice daily    Return for await labwrk results- may need to come in to discuss o/w  54mo.  Anticipatory guidance and routine counseling done re: condition, txmnt options and need for follow up. All questions of patient's were answered.   Gross side effects, risk and benefits, and alternatives of medications discussed with patient.  Patient is aware that all medications have potential side effects and we are unable to predict every sideeffect or drug-drug interaction that may occur.  Expresses verbal understanding and consents to current therapy plan and treatment regiment.  Please see AVS handed out to patient at the end of our visit for additional patient instructions/ counseling done pertaining to  today's office visit.  Note: This document was prepared using Dragon voice recognition software and may include unintentional dictation errors.    This document serves as a record of services personally performed by [name], DO. It was created on her behalf by Mayer Masker, a trained medical scribe. The creation of this record is based on the  scribe's personal observations and the provider's statements to them.   I have reviewed the above medical documentation for accuracy and completeness and I concur.  [name] 10/14/17 2:46 PM   ----------------------------------------------------------------------------------------------------------------------  Subjective:   CC:   Ralph Mack is a 52 y.o. male who presents to [place] at [facility] today for review and discussion of recent bloodwork that was done. Pt works nights and reports decreased energy. Pt drinks 1.5 packs of beer/day but only drinks on his day off.   1. All recent blood work that we ordered was reviewed with patient today.  Patient was counseled on all abnormalities and we discussed dietary and lifestyle changes that could help those values (also medications when appropriate).  Extensive health counseling performed and all patient's concerns/ questions were addressed.  2. Patient has continued fatigue.  He thinks this is related to his testosterone.  Approximately 6 months ago or so his T was under 200.  At that time he did not want treatment.  He would like to revisit this today.  He also agrees to HIV and hepatitis C screening as well as a T level. 3. He continues to have urinary frequency approximately 10 times or more daily and nightly at least 3 times.  Oxybutynin did not help him.  We discussed this briefly today and once testosterone level comes back, we will consider sending him to urology after our discussion next office visit. 4. Patient states he occasionally checks his blood pressure at home it is never high.  It is always in the 130s over 70s or so.  He says his wife occasionally checks it for him which is usually once every 2 weeks.  He denies any symptoms at all. 5. Patient admits to cooking a lot of rich foods including desserts as well as enjoying his wine and beer.  He drinks at least a half a case 3 days/week when he is not  working.  Otherwise he works second to third shift and does not drink during the days he works. 6. Patient admits to also not exercising.  He continues to feel fatigued.    Wt Readings from Last 3 Encounters:  10/14/17 260 lb (117.9 kg)  11/08/16 247 lb (112 kg)  10/31/16 247 lb (112 kg)   BP Readings from Last 3 Encounters:  10/14/17 (!) 148/94  11/08/16 (!) 150/93  10/31/16 116/72   Pulse Readings from Last 3 Encounters:  10/14/17 79  11/08/16 62  10/31/16 68   BMI Readings from Last 3 Encounters:  10/14/17 40.72 kg/m  11/08/16 38.69 kg/m  10/31/16 38.69 kg/m     Patient Care Team    Relationship Specialty Notifications Start End  [name], DO PCP - General Family Medicine  09/26/16   Melida Quitter, MD Consulting Physician Otolaryngology  09/26/16   Kennith Gain, MD Consulting Physician Allergy  09/26/16    Comment: [address] [place].   Gatha Mayer, MD Consulting Physician Gastroenterology  11/21/16     Full medical history updated and reviewed in the office today  Patient Active Problem List   Diagnosis Date Noted  .  Hypothyroidism 10/24/2016    Priority: High  . OAB (overactive bladder) 10/24/2016    Priority: High  . Prediabetes 09/05/2015    Priority: High  . Hypertriglyceridemia 09/05/2015    Priority: High  . Obesity, Class II, BMI 35-39.9, isolated 07/26/2015    Priority: High  . Smoker 07/26/2015    Priority: High  . Fatigue 10/24/2016    Priority: Medium  . Alcohol use 09/26/2016    Priority: Medium  . History of substance abuse 09/26/2016    Priority: Medium  . Gout 07/26/2015    Priority: Medium  . Urinary frequency 10/24/2016    Priority: Low  . Low libido 09/26/2016    Priority: Low  . Environmental and seasonal allergies 09/26/2016    Priority: Low  . Family hx of colon cancer requiring screening colonoscopy 09/26/2016    Priority: Low  . Vitamin D deficiency 09/05/2015    Priority: Low  . Elevated blood  pressure reading- w/o dx HTN 10/14/2017  . White coat syndrome without diagnosis of hypertension 10/14/2017  . Hx of colonic polyp 11/13/2016  . Hematochezia 09/26/2016  . Screening for colon cancer 09/26/2016  . Recurrent sinusitis 07/26/2015  . Cervical disc disease 07/26/2015    Past Medical History:  Diagnosis Date  . Allergy   . Arthritis   . Hx of colonic polyp 11/13/2016   10/2016 7 mm ssp/a - recall 2023  . Hypothyroidism   . Prediabetes   . Recurrent upper respiratory infection (URI)     Past Surgical History:  Procedure Laterality Date  . NO PAST SURGERIES      Social History   Tobacco Use  . Smoking status: Current Every Day Smoker    Packs/day: 1.00    Years: 30.00    Pack years: 30.00    Types: Cigarettes  . Smokeless tobacco: Never Used  Substance Use Topics  . Alcohol use: Yes    Alcohol/week: 12.0 oz    Types: 20 Cans of beer per week    Family Hx: Family History  Problem Relation Age of Onset  . Asthma Mother   . COPD Mother   . Cancer Mother        skin  . Heart disease Father   . Parkinson's disease Father   . Depression Daughter   . Diabetes Paternal Grandfather   . Kidney disease Paternal Grandfather   . Colon polyps Neg Hx   . Colon cancer Neg Hx   . Esophageal cancer Neg Hx   . Stomach cancer Neg Hx   . Rectal cancer Neg Hx      Medications: Current Outpatient Medications  Medication Sig Dispense Refill  . allopurinol (ZYLOPRIM) 300 MG tablet Take 1 tablet (300 mg total) by mouth daily. 90 tablet 3  . Cholecalciferol (VITAMIN D3) 5000 units TABS 5,000 IU OTC vitamin D3 daily. 90 tablet 3  . fexofenadine (ALLEGRA) 180 MG tablet Take 180 mg by mouth daily.    . fluticasone (FLONASE) 50 MCG/ACT nasal spray Place 2 sprays into the nose daily.    Marland Kitchen ipratropium (ATROVENT) 0.03 % nasal spray Place 2 sprays into both nostrils 3 (three) times daily. 30 mL 5  . levothyroxine (SYNTHROID) 50 MCG tablet Take 1 tablet (50 mcg total) by mouth  daily before breakfast. 90 tablet 1  . Olopatadine HCl (PAZEO) 0.7 % SOLN Place 1 drop into both eyes daily as needed (itchy/watery eyes). 1 Bottle 5  . Omega-3 Fatty Acids (FISH OIL) 1200 MG CAPS 2  p.o. twice daily     Current Facility-Administered Medications  Medication Dose Route Frequency Provider Last Rate Last Dose  . 0.9 %  sodium chloride infusion  500 mL Intravenous Continuous Gatha Mayer, MD        Allergies:  No Known Allergies   Review of Systems: General:   No F/C, wt loss Pulm:   No DIB, SOB, pleuritic chest pain Card:  No CP, palpitations Abd:  No n/v/d or pain Ext:  No inc edema from baseline  Objective:  Blood pressure (!) 148/94, pulse 79, height 5\' 7"  (1.702 m), weight 260 lb (117.9 kg), SpO2 98 %. Body mass index is 40.72 kg/m. Gen:   Well NAD, A and O *3 HEENT:    [place]/AT, EOMI,  MMM Lungs:   Normal work of breathing. CTA B/L, no Wh, rhonchi Heart:   RRR, S1, S2 WNL's, no MRG Abd:   No gross distention Exts:    warm, pink,  Brisk capillary refill, warm and well perfused.  Psych:    No HI/SI, judgement and insight good, Euthymic mood. Full Affect.   Recent Results (from the past 2160 hour(s))  Hemoglobin A1c     Status: Abnormal   Collection Time: 10/06/17  8:33 AM  Result Value Ref Range   Hgb A1c MFr Bld 5.9 (H) 4.8 - 5.6 %    Comment:          Prediabetes: 5.7 - 6.4          Diabetes: >6.4          Glycemic control for adults with diabetes: <7.0    Est. average glucose Bld gHb Est-mCnc 123 mg/dL  Lipid panel     Status: Abnormal   Collection Time: 10/06/17  8:33 AM  Result Value Ref Range   Cholesterol, Total 178 100 - 199 mg/dL   Triglycerides 284 (H) 0 - 149 mg/dL   HDL 48 >39 mg/dL   VLDL Cholesterol Cal 57 (H) 5 - 40 mg/dL   LDL Calculated 73 0 - 99 mg/dL   Chol/HDL Ratio 3.7 0.0 - 5.0 ratio    Comment:                                   T. Chol/HDL Ratio                                             Men  Women                                1/2 Avg.Risk  3.4    3.3                                   Avg.Risk  5.0    4.4                                2X Avg.Risk  9.6    7.1  3X Avg.Risk 23.4   11.0   Comprehensive metabolic panel     Status: Abnormal   Collection Time: 10/06/17  8:33 AM  Result Value Ref Range   Glucose 101 (H) 65 - 99 mg/dL   BUN 15 6 - 24 mg/dL   Creatinine, Ser 0.89 0.76 - 1.27 mg/dL   GFR calc non Af Amer 98 >59 mL/min/1.73   GFR calc Af Amer 113 >59 mL/min/1.73   BUN/Creatinine Ratio 17 9 - 20   Sodium 141 134 - 144 mmol/L   Potassium 5.3 (H) 3.5 - 5.2 mmol/L   Chloride 97 96 - 106 mmol/L   CO2 28 20 - 29 mmol/L   Calcium 9.4 8.7 - 10.2 mg/dL   Total Protein 6.8 6.0 - 8.5 g/dL   Albumin 4.5 3.5 - 5.5 g/dL   Globulin, Total 2.3 1.5 - 4.5 g/dL   Albumin/Globulin Ratio 2.0 1.2 - 2.2   Bilirubin Total 0.2 0.0 - 1.2 mg/dL   Alkaline Phosphatase 60 39 - 117 IU/L   AST 21 0 - 40 IU/L   ALT 30 0 - 44 IU/L  TSH     Status: None   Collection Time: 10/06/17  8:33 AM  Result Value Ref Range   TSH 2.140 0.450 - 4.500 uIU/mL  VITAMIN D 25 Hydroxy (Vit-D Deficiency, Fractures)     Status: None   Collection Time: 10/06/17  8:33 AM  Result Value Ref Range   Vit D, 25-Hydroxy 47.4 30.0 - 100.0 ng/mL    Comment: Vitamin D deficiency has been defined by the Institute of Medicine and an Endocrine Society practice guideline as a level of serum 25-OH vitamin D less than 20 ng/mL (1,2). The Endocrine Society went on to further define vitamin D insufficiency as a level between 21 and 29 ng/mL (2). 1. IOM (Institute of Medicine). 2010. Dietary reference    intakes for calcium and D. [place]: The    [employer]. 2. Holick MF, Binkley [place], Bischoff-Ferrari HA, et al.    Evaluation, treatment, and prevention of vitamin D    deficiency: an Endocrine Society clinical practice    guideline. JCEM. 2011 Jul; 96(7):1911-30.   CBC with Differential/Platelet     Status:  None   Collection Time: 10/06/17  8:33 AM  Result Value Ref Range   WBC 7.4 3.4 - 10.8 x10E3/uL   RBC 4.88 4.14 - 5.80 x10E6/uL   Hemoglobin 15.5 13.0 - 17.7 g/dL   Hematocrit 45.4 37.5 - 51.0 %   MCV 93 79 - 97 fL   MCH 31.8 26.6 - 33.0 pg   MCHC 34.1 31.5 - 35.7 g/dL   RDW 13.3 12.3 - 15.4 %   Platelets 183 150 - 379 x10E3/uL   Neutrophils 55 Not Estab. %   Lymphs 31 Not Estab. %   Monocytes 9 Not Estab. %   Eos 4 Not Estab. %   Basos 1 Not Estab. %   Neutrophils Absolute 4.1 1.4 - 7.0 x10E3/uL   Lymphocytes Absolute 2.3 0.7 - 3.1 x10E3/uL   Monocytes Absolute 0.7 0.1 - 0.9 x10E3/uL   EOS (ABSOLUTE) 0.3 0.0 - 0.4 x10E3/uL   Basophils Absolute 0.0 0.0 - 0.2 x10E3/uL   Immature Granulocytes 0 Not Estab. %   Immature Grans (Abs) 0.0 0.0 - 0.1 x10E3/uL  T4, free     Status: None   Collection Time: 10/06/17  8:33 AM  Result Value Ref Range   Free T4 1.21 0.82 - 1.77 ng/dL

## 2017-10-14 NOTE — Patient Instructions (Addendum)
A1c, FLP ( for TG) -  rechk 66mo  Health goals:  - For 90 days no alcohol    Risk factors for prediabetes and type 2 diabetes  Researchers don't fully understand why some people develop prediabetes and type 2 diabetes and others don't.  It's clear that certain factors increase the risk, however, including:  Weight. The more fatty tissue you have, the more resistant your cells become to insulin.  Inactivity. The less active you are, the greater your risk. Physical activity helps you control your weight, uses up glucose as energy and makes your cells more sensitive to insulin.  Family history. Your risk increases if a parent or sibling has type 2 diabetes.  Race. Although it's unclear why, people of certain races - including blacks, Hispanics, American Indians and Asian-Americans - are at higher risk.  Age. Your risk increases as you get older. This may be because you tend to exercise less, lose muscle mass and gain weight as you age. But type 2 diabetes is also increasing dramatically among children, adolescents and younger adults.  Gestational diabetes. If you developed gestational diabetes when you were pregnant, your risk of developing prediabetes and type 2 diabetes later increases. If you gave birth to a baby weighing more than 9 pounds (4 kilograms), you're also at risk of type 2 diabetes.  Polycystic ovary syndrome. For women, having polycystic ovary syndrome - a common condition characterized by irregular menstrual periods, excess hair growth and obesity - increases the risk of diabetes.  High blood pressure. Having blood pressure over 140/90 millimeters of mercury (mm Hg) is linked to an increased risk of type 2 diabetes.  Abnormal cholesterol and triglyceride levels. If you have low levels of high-density lipoprotein (HDL), or "good," cholesterol, your risk of type 2 diabetes is higher. Triglycerides are another type of fat carried in the blood. People with high levels of triglycerides  have an increased risk of type 2 diabetes. Your doctor can let you know what your cholesterol and triglyceride levels are.  A good guide to good carbs: The glycemic index ---If you have diabetes, or at risk for diabetes, you know all too well that when you eat carbohydrates, your blood sugar goes up. The total amount of carbs you consume at a meal or in a snack mostly determines what your blood sugar will do. But the food itself also plays a role. A serving of white rice has almost the same effect as eating pure table sugar - a quick, high spike in blood sugar. A serving of lentils has a slower, smaller effect.  ---Picking good sources of carbs can help you control your blood sugar and your weight. Even if you don't have diabetes, eating healthier carbohydrate-rich foods can help ward off a host of chronic conditions, from heart disease to various cancers to, well, diabetes.  ---One way to choose foods is with the glycemic index (GI). This tool measures how much a food boosts blood sugar.  The glycemic index rates the effect of a specific amount of a food on blood sugar compared with the same amount of pure glucose. A food with a glycemic index of 28 boosts blood sugar only 28% as much as pure glucose. One with a GI of 95 acts like pure glucose.    High glycemic foods result in a quick spike in insulin and blood sugar (also known as blood glucose).  Low glycemic foods have a slower, smaller effect- these are healthier for you.   Using  the glycemic index Using the glycemic index is easy: choose foods in the low GI category instead of those in the high GI category (see below), and go easy on those in between. Low glycemic index (GI of 55 or less): Most fruits and vegetables, beans, minimally processed grains, pasta, low-fat dairy foods, and nuts.  Moderate glycemic index (GI 56 to 69): White and sweet potatoes, corn, white rice, couscous, breakfast cereals such as Cream of Wheat and Mini Wheats.  High  glycemic index (GI of 70 or higher): White bread, rice cakes, most crackers, bagels, cakes, doughnuts, croissants, most packaged breakfast cereals. You can see the values for 100 commons foods and get links to more at www.[email or web address].si.  Swaps for lowering glycemic index  Instead of this high-glycemic index food Eat this lower-glycemic index food  White rice Brown rice or converted rice  Instant oatmeal Steel-cut oats  Cornflakes Bran flakes  Baked potato Pasta, bulgur  White bread Whole-grain bread  Corn Peas or leafy greens       Prediabetes Eating Plan  Prediabetes--also called impaired glucose tolerance or impaired fasting glucose--is a condition that causes blood sugar (blood glucose) levels to be higher than normal. Following a healthy diet can help to keep prediabetes under control. It can also help to lower the risk of type 2 diabetes and heart disease, which are increased in people who have prediabetes. Along with regular exercise, a healthy diet:  Promotes weight loss.  Helps to control blood sugar levels.  Helps to improve the way that the body uses insulin.   WHAT DO I NEED TO KNOW ABOUT THIS EATING PLAN?   Use the glycemic index (GI) to plan your meals. The index tells you how quickly a food will raise your blood sugar. Choose low-GI foods. These foods take a longer time to raise blood sugar.  Pay close attention to the amount of carbohydrates in the food that you eat. Carbohydrates increase blood sugar levels.  Keep track of how many calories you take in. Eating the right amount of calories will help you to achieve a healthy weight. Losing about 7 percent of your starting weight can help to prevent type 2 diabetes.  You may want to follow a Mediterranean diet. This diet includes a lot of vegetables, lean meats or fish, whole grains, fruits, and healthy oils and fats.   WHAT FOODS CAN I EAT?  Grains Whole grains, such as whole-wheat or whole-grain  breads, crackers, cereals, and pasta. Unsweetened oatmeal. Bulgur. Barley. Quinoa. Brown rice. Corn or whole-wheat flour tortillas or taco shells. Vegetables Lettuce. Spinach. Peas. Beets. Cauliflower. Cabbage. Broccoli. Carrots. Tomatoes. Squash. Eggplant. Herbs. Peppers. Onions. Cucumbers. Brussels sprouts. Fruits Berries. Bananas. Apples. Oranges. Grapes. Papaya. Mango. Pomegranate. Kiwi. Grapefruit. Cherries. Meats and Other Protein Sources Seafood. Lean meats, such as chicken and [place] or lean cuts of pork and beef. Tofu. Eggs. Nuts. Beans. Dairy Low-fat or fat-free dairy products, such as yogurt, cottage cheese, and cheese. Beverages Water. Tea. Coffee. Sugar-free or diet soda. Seltzer water. Milk. Milk alternatives, such as soy or almond milk. Condiments Mustard. Relish. Low-fat, low-sugar ketchup. Low-fat, low-sugar barbecue sauce. Low-fat or fat-free mayonnaise. Sweets and Desserts Sugar-free or low-fat pudding. Sugar-free or low-fat ice cream and other frozen treats. Fats and Oils Avocado. Walnuts. Olive oil. The items listed above may not be a complete list of recommended foods or beverages. Contact your dietitian for more options.    WHAT FOODS ARE NOT RECOMMENDED?  Grains Refined white flour and flour  products, such as bread, pasta, snack foods, and cereals. Beverages Sweetened drinks, such as sweet iced tea and soda. Sweets and Desserts Baked goods, such as cake, cupcakes, pastries, cookies, and cheesecake. The items listed above may not be a complete list of foods and beverages to avoid. Contact your dietitian for more information.   This information is not intended to replace advice given to you by your health care provider. Make sure you discuss any questions you have with your health care provider.   Document Released: 01/31/2015 Document Reviewed: 01/31/2015 Elsevier Interactive Patient Education 2016 [place] and dirty--> lower  triglyceride levels more through...  1) - Beware of bad fats: Cutting back on saturated fat (in red meat and full-fat dairy foods) and trans fats (in restaurant fried foods and commercially prepared baked goods) can lower triglycerides.  2) - Go for good carbs: Easily digested carbohydrates (such as white bread, white rice, cornflakes, and sugary sodas) give triglycerides a definite boost.   3) - Eating whole grains and cutting back on soda can help control triglycerides.  4) - Check your alcohol use. In some people, alcohol dramatically boosts triglycerides. The only way to know if this is true for you is to avoid alcohol for a few weeks and have your triglycerides tested again.  5) - Go fish. Omega-3 fats in salmon, tuna, sardines, and other fatty fish can lower triglycerides. Having fish twice a week is fine.  6) - Aim for a healthy weight. If you are overweight, losing just 5% to 10% of your weight can help drive down triglycerides.  7) - Get moving. Exercise lowers triglycerides and boosts heart-healthy HDL cholesterol.  8) - quit smoking if you do  --> for more information, see below; or go to  www.heart.org  and do a search for desired topics   For those diagnosed with high triglycerides, it's important to take action to lower your levels and improve your heart health.  Triglyceride is just a fancy word for fat - the fat in our bodies is stored in the form of triglycerides. Triglycerides are found in foods and manufactured in our bodies.  Normal triglyceride levels are defined as less than 150 mg/dL; 150 to 199 is considered borderline high; 200 to 499 is high; and 500 or higher is officially called very high. To me, anything over 150 is a red flag indicating my patient needs to take immediate steps to get the situation under control.   What is the significance of high triglycerides? High triglyceride levels make blood thicker and stickier, which means that it is more likely to form  clots. Studies have shown that triglyceride levels are associated with increased risks of cardiovascular disease and stroke - in both men and women - alone or in combination with other risk factors (high triglycerides combined with high LDL cholesterol can be a particularly deadly combination). For example, in one ground-breaking study, high triglycerides alone increased the risk of cardiovascular disease by 14 percent in men, and by 23 percent in women. But when the test subjects also had low HDL cholesterol (that's the good cholesterol) and other risk factors, high triglycerides increased the risk of disease by 32 percent in men and 76 percent in women.   Fortunately, triglycerides can sometimes be controlled with several diet and lifestyle changes.    What Factors Can Increase Triglycerides? As with cholesterol, eating too much of the wrong kinds of fats will raise your blood triglycerides.  Therefore, it's important to restrict the amounts of saturated fats and trans fats you allow into your diet.  Triglyceride levels can also shoot up after eating foods that are high in carbohydrates or after drinking alcohol.  That's why triglyceride blood tests require an overnight fast.  If you have elevated triglycerides, it's especially important to avoid sugary and refined carbohydrates, including sugar, honey, and other sweeteners, soda and other sugary drinks, candy, baked goods, and anything made with white (refined or enriched) flour, including white bread, rolls, cereals, buns, pastries, regular pasta, and white rice.  You'll also want to limit dried fruit and fruit juice since they're dense in simple sugar.  All of these low-quality carbs cause a sudden rise in insulin, which may lead to a spike in triglycerides.  Triglycerides can also become elevated as a reaction to having diabetes, hypothyroidism, or kidney disease. As with most other heart-related factors, being overweight and inactive also contribute to  abnormal triglycerides. And unfortunately, some people have a genetic predisposition that causes them to manufacture way too much triglycerides on their own, no matter how carefully they eat.     How Can You Lower Your Triglyceride Levels? If you are diagnosed with high triglycerides, it's important to take action. There are several things you can do to help lower your triglyceride levels and improve your heart health:  --> Lose weight if you are overweight.  There is a clear correlation between obesity and high triglycerides - the heavier people are, the higher their triglyceride levels are likely to be. The good news is that losing weight can significantly lower triglycerides. In a large study of individuals with type 2 diabetes, those assigned to the "lifestyle intervention group" - which involved counseling, a low-calorie meal plan, and customized exercise program - lost 8.6% of their body weight and lowered their triglyceride levels by more than 16%. If you're overweight, find a weight loss plan that works for you and commit to shedding the pounds and getting healthier.  --> Reduce the amount of saturated fat and trans fat in your diet.  Start by avoiding or dramatically limiting butter, cream cheese, lard, sour cream, doughnuts, cakes, cookies, candy bars, regular ice cream, fried foods, pizza, cheese sauce, cream-based sauces and salad dressings, high-fat meats (including fatty hamburgers, bologna, pepperoni, sausage, bacon, salami, pastrami, spareribs, and hot dogs), high-fat cuts of beef and pork, and whole-milk dairy products.   Other ways to cut back: Choose lean meats only (including skinless chicken and [place], lean beef, lean pork), fish, and reduced-fat or fat-free dairy products.   Experiment with adding whole soy foods to your diet. Although soy itself may not reduce risk of heart disease, it replaces hazardous animal fats with healthier proteins. Choose high-quality soy foods, such  as tofu, tempeh, soy milk, and edamame (whole soybeans).  Always remove skin from poultry.  Prepare foods by baking, roasting, broiling, boiling, poaching, steaming, grilling, or stir-frying in vegetable oil.  Most stick margarines contain trans fats, and trans fats are also found in some packaged baked goods, potato chips, snack foods, fried foods, and fast food that use or create hydrogenated oils.    (All food labels must now list the amount of trans fats, right after the amount of saturated fats - good news for consumers. As a result, many food companies have now reformulated their products to be trans fat free.many, but not all! So it's still just as important to read labels and make sure the packaged foods you  buy don't contain trans fats.)     If you use margarine, purchase soft-tub margarine spreads that contain 0 grams trans fats and don't list any partially hydrogenated oils in the ingredients list. By substituting olive oil or vegetable oil for trans fats in just 2 percent of your daily calories, you can reduce your risk of heart disease by 53 percent.   There is no safe amount of trans fats, so try to keep them as far from your plate as possible.  -->  Avoid foods that are concentrated in sugar (even dried fruit and fruit juice). Sugary foods can elevate triglyceride levels in the blood, so keep them to a bare minimum.  --> Swap out refined carbohydrates for whole grains.  Refined carbohydrates - like white rice, regular pasta, and anything made with white or "enriched" flour (including white bread, rolls, cereals, buns, and crackers) - raise blood sugar and insulin levels more than fiber-rich whole grains. Higher insulin levels, in turn, can lead to a higher rise in triglycerides after a meal. So, make the switch to whole wheat bread, whole grain pasta, brown or wild rice, and whole grain versions of cereals, crackers, and other bread products. However, it's important to know that  individuals with high triglycerides should moderate even their intake of high-quality starches (since all starches raise blood sugar) - I recommend 1 to 2 servings per meal.  --> Cut way back on alcohol.  If you have high triglycerides, alcohol should be considered a rare treat - if you indulge at all, since even small amounts of alcohol can dramatically increase triglyceride levels.  --> Incorporate omega-3 fats.  Heart-healthy fish oils are especially rich in omega-3 fatty acids. In multiple studies over the past two decades, people who ate diets high in omega-3s had 30 to 40 percent reductions in heart disease. Although we don't yet know why fish oil works so well, there are several possibilities. Omega-3s seem to reduce inflammation, reduce high blood pressure, decrease triglycerides, raise HDL cholesterol, and make blood thinner and less sticky so it is less likely to clot. It's as close to a food prescription for heart health as it gets. If you have high triglycerides, I recommend eating at least three servings of one of the omega-3-rich fish every week (fatty fish is the most concentrated food form of omega three fats). If you cannot manage to eat that much fish, speak with your physician about taking fish oil capsules, which offer similar benefits.The best foods for omega-3 fatty acids include wild salmon (fresh, canned), herring, mackerel (not king), sardines, anchovies, rainbow trout, and Pacific oysters. Non-fish sources of omega-3 fats include omega-3-fortified eggs, ground flaxseed, chia seeds, walnuts, butternuts (white walnuts), seaweed, walnut oil, canola oil, and soybeans.  --> Quit smoking.  Smoking causes inflammation, not just in your lungs, but throughout your body. Inflammation can contribute to atherosclerosis, blood clots, and risk of heart attack. Smoking makes all heart health indicators worse. If you have high cholesterol, high triglycerides, or high blood pressure, smoking  magnifies the danger.  --> Become more physically active.  Even moderate exercise can help improve cholesterol, triglycerides, and blood pressure. Aerobic exercise seems to be able to stop the sharp rise of triglycerides after eating, perhaps because of a decrease in the amount of triglyceride released by the liver, or because active muscle clears triglycerides out of the blood stream more quickly than inactive muscle. If you haven't exercised regularly (or at all) for years, I recommend starting slowly,  by walking at an easy pace for 15 minutes a day. Then, as you feel more comfortable, increase the amount. Your ultimate goal should be at least 30 minutes of moderate physical activity, at least five days a week.

## 2017-10-15 LAB — HIV ANTIBODY (ROUTINE TESTING W REFLEX): HIV SCREEN 4TH GENERATION: NONREACTIVE

## 2017-10-15 LAB — HEPATITIS C ANTIBODY

## 2017-10-15 LAB — TESTOSTERONE: TESTOSTERONE: 115 ng/dL — AB (ref 264–916)

## 2017-10-17 ENCOUNTER — Encounter: Payer: Self-pay | Admitting: Family Medicine

## 2017-12-26 ENCOUNTER — Other Ambulatory Visit: Payer: Self-pay | Admitting: Family Medicine

## 2017-12-26 DIAGNOSIS — M1000 Idiopathic gout, unspecified site: Secondary | ICD-10-CM

## 2018-03-30 ENCOUNTER — Other Ambulatory Visit: Payer: Self-pay | Admitting: Family Medicine

## 2018-03-30 DIAGNOSIS — E039 Hypothyroidism, unspecified: Secondary | ICD-10-CM

## 2018-08-17 ENCOUNTER — Encounter: Payer: Self-pay | Admitting: Family Medicine

## 2018-08-17 ENCOUNTER — Ambulatory Visit (INDEPENDENT_AMBULATORY_CARE_PROVIDER_SITE_OTHER): Payer: BLUE CROSS/BLUE SHIELD | Admitting: Family Medicine

## 2018-08-17 ENCOUNTER — Telehealth: Payer: Self-pay | Admitting: Family Medicine

## 2018-08-17 VITALS — BP 168/108 | HR 80 | Temp 98.1°F | Ht 67.0 in | Wt 246.0 lb

## 2018-08-17 DIAGNOSIS — F430 Acute stress reaction: Secondary | ICD-10-CM

## 2018-08-17 DIAGNOSIS — E669 Obesity, unspecified: Secondary | ICD-10-CM

## 2018-08-17 DIAGNOSIS — E781 Pure hyperglyceridemia: Secondary | ICD-10-CM

## 2018-08-17 DIAGNOSIS — G479 Sleep disorder, unspecified: Secondary | ICD-10-CM | POA: Diagnosis not present

## 2018-08-17 DIAGNOSIS — R7303 Prediabetes: Secondary | ICD-10-CM

## 2018-08-17 DIAGNOSIS — E559 Vitamin D deficiency, unspecified: Secondary | ICD-10-CM

## 2018-08-17 DIAGNOSIS — R454 Irritability and anger: Secondary | ICD-10-CM | POA: Diagnosis not present

## 2018-08-17 DIAGNOSIS — R7989 Other specified abnormal findings of blood chemistry: Secondary | ICD-10-CM

## 2018-08-17 DIAGNOSIS — F17200 Nicotine dependence, unspecified, uncomplicated: Secondary | ICD-10-CM

## 2018-08-17 DIAGNOSIS — R030 Elevated blood-pressure reading, without diagnosis of hypertension: Secondary | ICD-10-CM

## 2018-08-17 DIAGNOSIS — E66812 Obesity, class 2: Secondary | ICD-10-CM

## 2018-08-17 DIAGNOSIS — E039 Hypothyroidism, unspecified: Secondary | ICD-10-CM

## 2018-08-17 MED ORDER — FLUOXETINE HCL 10 MG PO TABS: ORAL_TABLET | ORAL | 0 refills | Status: DC

## 2018-08-17 MED ORDER — TRAZODONE HCL 50 MG PO TABS: 50.0000 mg | ORAL_TABLET | Freq: Every evening | ORAL | 0 refills | Status: DC | PRN

## 2018-08-17 NOTE — Telephone Encounter (Signed)
Spoke to the patient.  Patient states that he is shaky.  Patient was advise by tele doctor to follow up with PCP that he may need labs.  Appt made for patient today @ 1100. MPulliam, CMA/RT(R)

## 2018-08-17 NOTE — Progress Notes (Signed)
Impression and Recommendations:    1. Acute reaction to situational stress   2. Irritability and anger   3. Sleeping difficulty   4. Obesity, Class II, BMI 35-39.9, isolated   5. Hypothyroidism, unspecified type   6. Prediabetes   7. Smoker   8. Low testosterone in male   20. Vitamin D deficiency   10. White coat syndrome without diagnosis of hypertension   11. Hypertriglyceridemia      Acute reaction to situational stress - Plan: CBC with Differential/Platelet, Comprehensive metabolic panel, T4, free, TSH, T3, free  Irritability and anger - Plan: CBC with Differential/Platelet, Comprehensive metabolic panel, T4, free, TSH, T3, free  Sleeping difficulty  Obesity, Class II, BMI 35-39.9, isolated - Plan: Lipid panel, Testosterone, Free, Total, SHBG  Hypothyroidism, unspecified type - Plan: T4, free, TSH, T3, free  Prediabetes - Plan: Comprehensive metabolic panel, Hemoglobin A1c, Lipid panel  Smoker - Plan: CBC with Differential/Platelet, Comprehensive metabolic panel, Hemoglobin A1c, Lipid panel, Testosterone, Free, Total, SHBG  Low 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 -Discussed that the patient will be on Prozac a minimum of 6 months to aid with his acute stress.  -Will provide prescription of lower-dose 10 mg tablet Prozac so that the patient will take 0.5 tablet a day to get the Prozac into his system, and slowly advance especially since he is so hypersensitive  and anxious recently- has high sympathetic response to any stimuli currently  Sleep issues:  -Discussed the benefits of Trazodone to aid with sleep.  -Advised the patient to take 0.5 tablet, see how it affects you and get at least 8 hours of sleep and take it 45 minutes before bed.  -Patient with history of overactive bladder.  Has never seen urology.  We did address this today and he declined urology evaluation at this time.  OAB:  -Will refer the patient to Urology to aid with OAB in future if patient agrees-  HE Declined today. -Will check the Testosterone levels today and if it is low, then we will send the patient to urology for further evaluation and treatment since I explained to patient today we do not do this here   Exercise management:  - Advised patient to continue working toward exercising to improve health.    -PT will begin with 15 minutes of activity daily. Recommended that the patient eventually strive for at least 150 minutes of cardio per week according to the [facility].   - Healthy dietary habits encouraged, including low-carb, and high amounts of lean protein in diet.   - Patient should also consume adequate amounts of water - half of body weight in oz of water per day  Pt was in the office today for 35+ minutes, with over 50% time spent in face to face counseling of patients various medical conditions, treatment plans of those medical conditions including medicine management and lifestyle modification, strategies to improve health and well being; and in coordination of care. SEE ABOVE TREATMENT PLAN FOR DETAILS   Follow up on 09/02/2018 for  mood recheck.  Patient desires to come in for rechk  sooner than later.   Orders Placed This Encounter  Procedures  . CBC with Differential/Platelet  . Comprehensive metabolic panel  . Hemoglobin A1c  . Lipid panel  . T4, free  . TSH  . VITAMIN D 25 Hydroxy (Vit-D Deficiency, Fractures)  . T3, free  . Testosterone, Free, Total,  SHBG    Meds ordered this encounter  Medications  . traZODone (DESYREL) 50 MG tablet    Sig: Take 1 tablet (50 mg total) by mouth at bedtime as needed for sleep.    Dispense:  30 tablet    Refill:  0  . FLUoxetine (PROZAC) 10 MG tablet    Sig: Take 0.5 tablets (5 mg total) by mouth daily for 6 days, THEN 1 tablet (10 mg total) daily for 6 days, THEN 2 tablets (20 mg total) daily for 18 days. Use one half tablet daily for 1 week then increase to 1 tablet daily.    Dispense:  45 tablet    Refill:  0     Gross side effects, risk and benefits, and alternatives of medications and treatment plan in general discussed with patient.  Patient is aware that all medications have potential side effects and we are unable to predict every side effect or drug-drug interaction that may occur.   Patient will call with any questions prior to using medication if they have concerns.    Expresses verbal understanding and consents to current therapy and treatment regimen.  No barriers to understanding were identified.  Red flag symptoms and signs discussed in detail.  Patient expressed understanding regarding what to do in case of emergency\urgent symptoms  Please see AVS handed out to patient at the end of our visit for further patient instructions/ counseling done pertaining to today's office visit.   Return for (2) wednesday Dec 4th-follow-up for severe stress reaction, elevated Bp, multiple concerns.     Note:  This note was prepared with assistance of Dragon voice recognition software. Occasional wrong-word or sound-a-like substitutions may have occurred due to the inherent limitations of voice recognition software.  This document serves as a record of services personally performed by [name], DO. It was created on her behalf by Steva Colder, a trained medical scribe. The creation of this record is based on the scribe's personal observations and the provider's statements to them.   I have  reviewed the above medical documentation for accuracy and completeness and I concur.  [name], DO, D.O. 08/17/2018 12:38 PM         --------------------------------------------------------------------------------------------------------------------------------------------------------------------------------------------------------------------------------------------    Subjective:     HPI: Ralph Mack is a 52 y.o. male who presents to [place] at [facility] today for issues as discussed below.  He notes that he hasn't been doing well since his father passed on last week.   He has had a lot of stress at work as well as home lately.  He has not been sleeping well in general nor has he felt very relaxed.  Feels much more stressed and tense than usual.  Much quicker to jump on somebody\yell at somebody lately,  with a shorter fuse than usual.  -->   his father didn't have a service due to not wanting anyone to see him in his current state. His father just let the patient know that he loved him 2 years ago-he said it for the first time.    -However, patient very stressed  because notes that he is at his limit for time off days and may be in jeopardy of losing his job.   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  His days that he took off, placed him on a "Step" for 6 months.  He is being reprimanded and watched carefully at work  He has had increased anxiety recently due to his his increased stress levels, more specifically yesterday when he was around a lot of people in [place].   He also noted increased tremors/ shakiness as well.    He took Alka-Seltzer prior to the onset of his shakiness without any acute reactions.   He reports that prior to this, he ate a small meal.    - He is smoking 0.5-1 PPD of cigarettes daily.   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 He doesn't sleep a consistent 8 hours, however, he sleeps in 2 hour increments due to having to get up and go pee.   He has been thinking about death and dying a lot lately following his fathers death. 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   He hasn't been evaluated by an urologist for his low testosterone. He has been taking all of his medications as prescribed.  He has not been exercising for improving T either   Exercise  / health, diet:  He has been on the keto diet and he has lost 10 lbs since 3-4 weeks ago when I saw his wife and diagnosed her with diabetes.  This was stressful for the both of them.    He has not been exercising but walks at work at least 4-5 miles a day.      Wt Readings from Last 3 Encounters:  08/17/18 246 lb (111.6 kg)  10/14/17 260 lb (117.9 kg)  11/08/16 247 lb (112 kg)   BP Readings from Last 3 Encounters:  08/17/18 (!) 168/108  10/14/17 (!) 148/94  11/08/16 (!) 150/93   Pulse Readings from Last 3 Encounters:  08/17/18 80  10/14/17 79  11/08/16 62   BMI Readings from Last 3 Encounters:  08/17/18 38.53 kg/m  10/14/17 40.72 kg/m  11/08/16 38.69 kg/m     Patient Care Team    Relationship Specialty Notifications Start End  [name], DO PCP - General Family Medicine  09/26/16   Melida Quitter, MD Consulting Physician Otolaryngology  09/26/16   Kennith Gain, MD Consulting Physician Allergy  09/26/16    Comment: [address] [place].   Gatha Mayer, MD Consulting Physician Gastroenterology  11/21/16      Patient Active Problem List   Diagnosis Date Noted  . Hypothyroidism 10/24/2016    Priority: High  . OAB (overactive bladder) 10/24/2016    Priority: High  . Prediabetes 09/05/2015    Priority: High  . Hypertriglyceridemia 09/05/2015    Priority: High    . Obesity, Class II, BMI 35-39.9, isolated 07/26/2015    Priority: High  . Smoker 07/26/2015    Priority: High  . Fatigue 10/24/2016    Priority: Medium  . Alcohol use 09/26/2016    Priority: Medium  . History of substance abuse ([place]) 09/26/2016  Priority: Medium  . Gout 07/26/2015    Priority: Medium  . Urinary frequency 10/24/2016    Priority: Low  . Low libido 09/26/2016    Priority: Low  . Environmental and seasonal allergies 09/26/2016    Priority: Low  . Family hx of colon cancer requiring screening colonoscopy 09/26/2016    Priority: Low  . Vitamin D deficiency 09/05/2015    Priority: Low  . Sleeping difficulty 08/17/2018  . Acute reaction to situational stress 08/17/2018  . Irritability and anger 08/17/2018  . Low testosterone in male 08/17/2018  . Elevated blood pressure reading- w/o dx HTN 10/14/2017  . White coat syndrome without diagnosis of hypertension 10/14/2017  . Hx of colonic polyp 11/13/2016  . Hematochezia 09/26/2016  . Screening for colon cancer 09/26/2016  . Recurrent sinusitis 07/26/2015  . Cervical disc disease 07/26/2015    Past Medical history, Surgical history, Family history, Social history, Allergies and Medications have been entered into the medical record, reviewed and changed as needed.    Current Meds  Medication Sig  . allopurinol (ZYLOPRIM) 300 MG tablet TAKE ONE TABLET BY MOUTH ONCE DAILY  . Cholecalciferol (VITAMIN D3) 5000 units TABS 5,000 IU OTC vitamin D3 daily.  . fexofenadine (ALLEGRA) 180 MG tablet Take 180 mg by mouth daily.  . fluticasone (FLONASE) 50 MCG/ACT nasal spray Place 2 sprays into the nose daily.  Marland Kitchen ipratropium (ATROVENT) 0.03 % nasal spray Place 2 sprays into both nostrils 3 (three) times daily.  Marland Kitchen levothyroxine (SYNTHROID, LEVOTHROID) 50 MCG tablet TAKE 1 TABLET BY MOUTH ONCE DAILY BEFORE BREAKFAST  . Olopatadine HCl (PAZEO) 0.7 % SOLN Place 1 drop into both eyes daily as needed (itchy/watery eyes).  . Omega-3  Fatty Acids (FISH OIL) 1200 MG CAPS 2 p.o. twice daily   Current Facility-Administered Medications for the 08/17/18 encounter (Office Visit) with [name], DO  Medication  . 0.9 %  sodium chloride infusion    Allergies:  No Known Allergies   Review of Systems:  A fourteen system review of systems was performed and found to be positive as per HPI.   Objective:   Blood pressure (!) 168/108, pulse 80, temperature 98.1 F (36.7 C), height 5\' 7"  (1.702 m), weight 246 lb (111.6 kg), SpO2 97 %. Body mass index is 38.53 kg/m. General:  Well Developed, well nourished, appropriate for stated age.  Neuro:  Alert and oriented,  extra-ocular muscles intact  HEENT:  Normocephalic, atraumatic, neck supple, no carotid bruits appreciated  Skin:  no gross rash, warm, pink. Cardiac:  RRR, S1 S2 Respiratory:  ECTA B/L and A/P, Not using accessory muscles, speaking in full sentences- unlabored. Vascular:  Ext warm, no cyanosis apprec.; cap RF less 2 sec. Psych:  No HI/SI, judgement and insight good, Euthymic mood. Full Affect.

## 2018-08-17 NOTE — Telephone Encounter (Signed)
Patient cld states over the weekend took some OTC meds  & thinks is having an drug interaction ( Pt did call [facility]/ On call nurse service who advised him to see his PCP if issue continues .Marland KitchenMarland KitchenPatient wishes advised & poss blood work to check him over before he goes into work today.  --forwarding message to medical asst.  --glh

## 2018-08-17 NOTE — Patient Instructions (Addendum)
You will be placed on 0.5 tablet of lower-dose Fluoxetine once a day to aid with stress, please take that for 6 days then go to the 1 full tablet.  If tolerated well after an additional 6 days then go to 2 full tablets daily.  Does not matter if this is at night or in the morning but I recommend after eating.   You will also be prescribed Trazodone to aid with sleep. Start with 0.5 tablet of Trazodone at least 45 minutes before bed and on days where you will have at least 8 hours of sleep.    Please do not drink any alcohol during this time when your emotions are as they are.  This will make you feel much more depressed and have emotional lability.  Please just take the medicines as prescribed and avoid alcohol     -It would be best to do some type of deep breathing exercises every day.  You can find this on YouTube and look up deep breathing exercises for relaxation.  They have 15-minute ones and I recommend you do it twice daily.    Also, it is very important you try to walk or exercise for stress management at least 15 to 30 minutes daily.  This will also help not only stress levels but your blood pressure, sleep etc.       -Also we need to transition your brain into thinking more positively.  These tasks below are some things I want you to do every day 1)  write 3 new things that you are grateful for every day for 21 days  2)  exercise daily- walk for 15 minutes twice a day every day 3)  you are going to journal every day about one positive experience that you had 4)  meditate every day.  You can go on YouTube and look for 15-minute relaxation meditation or what ever.  But we need to make sure that you are in the moment and relaxing and deep breathing every day 5)  Write 1 positive email every day to praise someone in your life     - If you have insomnia or difficulty sleeping, this information is for you:  - Avoid caffeinated beverages after lunch,  no alcoholic beverages,   no eating within 2-3 hours of lying down,  avoid exposure to blue light before bed,  avoid daytime naps, and  needs to maintain a regular sleep schedule- go to sleep and wake up around the same time every night.   - Resolve concerns or worries before entering bedroom:  Discussed relaxation techniques with patient and to keep a journal to write down fears\ worries.  I suggested seeing a counselor for CBT.   - Recommend patient meditate or do deep breathing exercises to help relax.   Incorporate the use of white noise machines or listen to "sleep meditation music", or recordings of guided meditations for sleep from YouTube which are free, such as  "guided meditation for detachment from over thinking"  by Mayford Knife.         What is Chronic Stress Syndrome, Symptoms & Ways to Deal With it   What is Chronic Stress Syndrome?  Chronic Stress Syndrome is something which can now be called as a medical condition due to the amount of stress an individual is going through these days. Chronic Stress Syndrome causes the body and mind to shutdown and the person has no control over himself or herself. Due to the demands of  modern day life and the hardship throughout day and night takes its toll over a period of time and the body and brain starts demanding rest and a break. This leads to certain symptoms where your performance level starts to dip at work, you become irritable both at work and at home, you may stop enjoying activities you previously liked, you may become depressed, you may get angry for even small things. Chronic Stress Syndrome can significantly impact your quality life. Thus it is important understand the symptoms of Chronic Stress Syndrome and react accordingly in order to cope up with it.  It is important to note here that a balanced work-home equation should be drawn to cut down symptoms of Chronic Stress Syndrome. Minor stressors can be overcome by the bodys inbuilt stress response but  when there is unending stress for a long period of time then an external help is required to ease the stress.  Chronic Stress Syndrome can physically and psychologically drain you over a period of time. For such cases stress management is the best way to cope up with Chronic Stress Syndrome. If Chronic Stress Syndrome is not treated then it may result in many health hazards like anxiety, muscle pain, insomnia, and high blood pressure along with a compromised immune system leading to frequent infections and missed days from work.    What are the Symptoms of Chronic Stress Syndrome?   The symptoms of Chronic Stress Syndrome are variable and range from generalized symptoms to emotional symptoms along with behavioral and cognitive symptoms. Some of these symptoms have been delineated below:  Generalized Symptoms of Chronic Stress Syndrome are: Anxiety Depression Social isolation Headache Abdominal pain Lack of sleep Back pain Difficulty in concentrating Hypertension Hemorrhoids Varicose veins Panic attacks/ Panic disorder Cardiovascular diseases.   Some of the Emotional Symptoms of Chronic Stress Syndrome are: To become easily agitated, moody and frustrated Feeling overwhelmed which makes you feel like you are losing control. Having difficulty relaxing and have a peaceful mind Having low self esteem Feeling lonely Feeling worthless Feeling depressed Avoiding social environment.   Some of the Physical Symptoms of Chronic Stress Syndrome are: Headaches Lethargy Alternating diarrhea and constipation Nausea Muscles aches and pains Insomnia Rapid heartbeat and chest pain Infections and frequent colds Decreased libido Nervousness and shaking Tinnitus Sweaty palms Dry mouth Clenched jaw.  Some of the Cognitive Symptoms of Chronic Stress Syndrome are: Constant worrying Racing thoughts Disorganization and forgetfulness Inability to focus Poor judgment Abundance of  negativity.  Some of the Behavioral Symptoms of Chronic Stress Syndrome are: Changes in appetite with less desire to eat Avoiding responsibilities Indulgence in alcohol or recreational drug use Increased nail biting and being fidgety Ways to Deal With Chronic Stress Syndrome    Chronic Stress Syndrome is not something which cannot be addressed. A bit of effort from your side in the form of lifestyle modifications, a little bit of exercise, a balanced work life equation can do wonders and help you get rid of Chronic Stress Syndrome.  Get Proper Sleep: It has been proved that Chronic Stress Syndrome causes loss of sleep where an individual may not even be able to sleep for days unending. This may result in the individual feeling lethargic and unable to focus at work the following morning. This may lead to decreased performance at work. Thus, it is important to have a good sleep-wake cycle. For this, try and not drink any caffeinated beverage about four hours prior to going to sleep, as caffeine  pumps up the adrenaline and causes you to stay awake resulting ultimately in Chronic Stress Syndrome.  Avoid Alcohol and Drugs: Another way to get rid of Chronic Stress Syndrome is lifestyle modifications. Stay away from alcohol and other recreational drugs. Take Short Frequent Breaks at Work: Try to take frequent breaks from work and do not work continuously. Try and manage your work in such a way that you even meet your deadline and come home on time for a happy dinner with family. A good time spent with family and kids does wonders in not only dealing with Chronic Stress Syndrome but also preventing it.  Become Physically Active: Another step towards getting rid of Chronic Stress Syndrome is physical activity. If you do not have time to spend at the gym then at least try and go for daily walks for about half an hour a day which not only keeps the stress away but also is good for your overall health.  Physical activity leads to production of endorphins which will make you feel relaxed and feel good.  Healthy Diet Can Help You Deal With Chronic Stress Syndrome: Have a balanced and healthy diet is another step towards a stress free life and keeping Chronic Stress Syndrome at [place]. If time is a constraint then you can try eating three small meals a day. Try and avoid fast foods and take foods which are healthy and rich in proteins, fiber, and carbohydrates to boost your energy system.  Music Can Soothe Your Mind: Light music is one of the best and most effective relaxation techniques that one can try to overcome stress. It has shown to calm down the mind and take you away from all the stressors that you may be having. These days it is also being used as a therapy in some institutes for overcoming stress. It is important here to discuss the importance of a good social support system for patients with Chronic Stress Syndrome, as a good social support framework can do wonders in taking the stress away from the patient and overcoming Chronic Stress Syndrome.  Meditation Can Help You Deal With Chronic Stress Syndrome Effectively: Meditation and yoga has also shown to be quite effective in relaxing the mind and coping up with Chronic Stress Syndrome   In cases where these measures are not helpful, then it is time for you to consult with a skilled psychologist or a psychiatrist for potential therapies or medications to control the stress response.   The psychologist can help you with a variety of steps for coping up with Chronic Stress Syndrome. Relaxation techniques and behavioral therapy are some of the methods employed by psychologists. In some cases, medications can also be given to help relax the patient.  Since Chronic Stress Syndrome is both emotionally and physically draining for the patient and it also adversely affects the family life of the patient hence it is important for the patient to recognize the  condition and taking steps to cope up with it. Escaping measures like alcohol and drug use are of no help as they only aggravate the condition apart from their other health hazards. If this condition is ignored or left untreated it can lead to various medical conditions like anxiety and depression and various other medical conditions.  Last but not least, smile as often as you can as it is the best gift that you can give to someone. The best way to stay relaxed is to have a good smile, exercise daily, spend time with  your family, meditation and if required consultation with a good psychologist so that you can live a stress free life and overcome the symptoms of Chronic Stress Syndrome.

## 2018-08-19 LAB — CBC WITH DIFFERENTIAL/PLATELET
BASOS ABS: 0.1 10*3/uL (ref 0.0–0.2)
BASOS: 1 %
EOS (ABSOLUTE): 0.2 10*3/uL (ref 0.0–0.4)
Eos: 3 %
Hematocrit: 46.7 % (ref 37.5–51.0)
Hemoglobin: 15.8 g/dL (ref 13.0–17.7)
IMMATURE GRANS (ABS): 0 10*3/uL (ref 0.0–0.1)
Immature Granulocytes: 1 %
LYMPHS: 20 %
Lymphocytes Absolute: 1.7 10*3/uL (ref 0.7–3.1)
MCH: 32.6 pg (ref 26.6–33.0)
MCHC: 33.8 g/dL (ref 31.5–35.7)
MCV: 96 fL (ref 79–97)
Monocytes Absolute: 0.7 10*3/uL (ref 0.1–0.9)
Monocytes: 8 %
Neutrophils Absolute: 5.7 10*3/uL (ref 1.4–7.0)
Neutrophils: 67 %
PLATELETS: 188 10*3/uL (ref 150–450)
RBC: 4.85 x10E6/uL (ref 4.14–5.80)
RDW: 12.7 % (ref 12.3–15.4)
WBC: 8.3 10*3/uL (ref 3.4–10.8)

## 2018-08-19 LAB — COMPREHENSIVE METABOLIC PANEL
ALT: 24 IU/L (ref 0–44)
AST: 17 IU/L (ref 0–40)
Albumin/Globulin Ratio: 2 (ref 1.2–2.2)
Albumin: 4.6 g/dL (ref 3.5–5.5)
Alkaline Phosphatase: 58 IU/L (ref 39–117)
BUN/Creatinine Ratio: 17 (ref 9–20)
BUN: 12 mg/dL (ref 6–24)
Bilirubin Total: 0.5 mg/dL (ref 0.0–1.2)
CALCIUM: 9.3 mg/dL (ref 8.7–10.2)
CO2: 24 mmol/L (ref 20–29)
Chloride: 92 mmol/L — ABNORMAL LOW (ref 96–106)
Creatinine, Ser: 0.71 mg/dL — ABNORMAL LOW (ref 0.76–1.27)
GFR, EST AFRICAN AMERICAN: 123 mL/min/{1.73_m2} (ref >59)
GFR, EST NON AFRICAN AMERICAN: 106 mL/min/{1.73_m2} (ref >59)
GLUCOSE: 107 mg/dL — AB (ref 65–99)
Globulin, Total: 2.3 g/dL (ref 1.5–4.5)
Potassium: 4.6 mmol/L (ref 3.5–5.2)
Sodium: 136 mmol/L (ref 134–144)
TOTAL PROTEIN: 6.9 g/dL (ref 6.0–8.5)

## 2018-08-19 LAB — TESTOSTERONE, FREE, TOTAL, SHBG
Sex Hormone Binding: 22.8 nmol/L (ref 19.3–76.4)
TESTOSTERONE FREE: 6.7 pg/mL — AB (ref 7.2–24.0)
Testosterone: 211 ng/dL — ABNORMAL LOW (ref 264–916)

## 2018-08-19 LAB — T4, FREE: Free T4: 1.43 ng/dL (ref 0.82–1.77)

## 2018-08-19 LAB — LIPID PANEL
CHOLESTEROL TOTAL: 220 mg/dL — AB (ref 100–199)
Chol/HDL Ratio: 3.1 ratio (ref 0.0–5.0)
HDL: 70 mg/dL (ref >39)
LDL CALC: 132 mg/dL — AB (ref 0–99)
TRIGLYCERIDES: 91 mg/dL (ref 0–149)
VLDL CHOLESTEROL CAL: 18 mg/dL (ref 5–40)

## 2018-08-19 LAB — HEMOGLOBIN A1C
ESTIMATED AVERAGE GLUCOSE: 114 mg/dL
Hgb A1c MFr Bld: 5.6 % (ref 4.8–5.6)

## 2018-08-19 LAB — T3, FREE: T3, Free: 3.2 pg/mL (ref 2.0–4.4)

## 2018-08-19 LAB — TSH: TSH: 1.76 u[IU]/mL (ref 0.450–4.500)

## 2018-08-19 LAB — VITAMIN D 25 HYDROXY (VIT D DEFICIENCY, FRACTURES): VIT D 25 HYDROXY: 54.6 ng/mL (ref 30.0–100.0)

## 2018-08-21 ENCOUNTER — Encounter: Payer: Self-pay | Admitting: Family Medicine

## 2018-08-23 ENCOUNTER — Encounter: Payer: Self-pay | Admitting: Family Medicine

## 2018-08-24 ENCOUNTER — Telehealth: Payer: Self-pay

## 2018-08-24 ENCOUNTER — Telehealth: Payer: Self-pay | Admitting: Family Medicine

## 2018-08-24 NOTE — Telephone Encounter (Signed)
Patient called and left VM requesting a call back from clinic staff about BP meds. States that he had a cardiac episode Saturday night and is worried about his overall BP, he is hoping for a call back as soon as possible at [phone]

## 2018-08-24 NOTE — Telephone Encounter (Signed)
Spoke with pt who currently denies any chest pain or pressure.  Per Dr. Raliegh Scarlet, advised pt needs OV to discuss initiating BP meds and discuss possible FMLA from work.  Pt expressed understanding and was transferred to front desk to schedule appt with Dr. Raliegh Scarlet.  Charyl Bigger, CMA

## 2018-08-24 NOTE — Telephone Encounter (Signed)
LVM on cellphone for pt to return call to discuss.  Charyl Bigger, CMA

## 2018-08-25 ENCOUNTER — Encounter: Payer: Self-pay | Admitting: Family Medicine

## 2018-08-25 ENCOUNTER — Telehealth: Payer: Self-pay | Admitting: Family Medicine

## 2018-08-25 ENCOUNTER — Ambulatory Visit (INDEPENDENT_AMBULATORY_CARE_PROVIDER_SITE_OTHER): Payer: BLUE CROSS/BLUE SHIELD | Admitting: Family Medicine

## 2018-08-25 VITALS — BP 117/79 | HR 80 | Temp 98.2°F | Ht 67.0 in | Wt 241.8 lb

## 2018-08-25 DIAGNOSIS — G479 Sleep disorder, unspecified: Secondary | ICD-10-CM | POA: Diagnosis not present

## 2018-08-25 DIAGNOSIS — R030 Elevated blood-pressure reading, without diagnosis of hypertension: Secondary | ICD-10-CM | POA: Diagnosis not present

## 2018-08-25 DIAGNOSIS — F438 Other reactions to severe stress: Secondary | ICD-10-CM | POA: Insufficient documentation

## 2018-08-25 DIAGNOSIS — F430 Acute stress reaction: Secondary | ICD-10-CM

## 2018-08-25 DIAGNOSIS — F4389 Other reactions to severe stress: Secondary | ICD-10-CM

## 2018-08-25 DIAGNOSIS — Z634 Disappearance and death of family member: Secondary | ICD-10-CM

## 2018-08-25 MED ORDER — PROPRANOLOL HCL 20 MG PO TABS: 10.0000 mg | ORAL_TABLET | Freq: Four times a day (QID) | ORAL | 1 refills | Status: DC

## 2018-08-25 MED ORDER — FLUOXETINE HCL 20 MG PO TABS: ORAL_TABLET | ORAL | 1 refills | Status: DC

## 2018-08-25 NOTE — Progress Notes (Signed)
Impression and Recommendations:    1. Stress-related physiological response affecting physical condition   2. Sleeping difficulty   3. Acute reaction to situational stress   4. White coat syndrome without diagnosis of hypertension   5. Death of family member     -Discussed with patient today that he needs paperwork filled out for [facility] medical leave from his job from this past Saturday until today.  Per patient this is from 11/23 through 08/25/2018.  Pt stated this is not for disability but was for the purpose of him being excused from being off of work due to the increased stress of recently losing his mother and recently having panic attacks etc. 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 and was unable to go to work.   He took himself out of work and did not go from Saturday through today due to significant mental stressors.   He is due to go back to work on this Friday, November 29, and I did tell him I see no reason why he cannot do so without restriction.   1. Anxiety - Stress Response - Symptoms are suboptimally managed at this time. - We will Increase Prozac dose  - If after 6 days, the 10 mg Prozac is not helping, increase to 20 mg. - Patient tolerating meds well without complication.  Denies S-E.  - Propanolol provided as short-acting beta blocker to blunt sympathetically-mediated physiologic responses to stress / spikes of high anxiety with panic which cause BP and HR to spike.   Risks and benefits of medication reviewed at length today.  Discussed that the beta blocker will help blunt the sympathetic response but once his stress is better controlled, he will need to follow his blood pressure closely and come down off the dose of propranolol.  I discussed this extensively with patient today in the office  - Reviewed the "spokes of the wheel" of mood and health management.  Stressed  the importance of ongoing prudent habits, including regular exercise, appropriate sleep hygiene, healthful dietary habits, and prayer/meditation to relax.  - Reviewed and demonstrated Cook's Hookup technique along with Square Breathing exercises to the patient today.  He clearly understood how to do these in the importance of them. - Advised patient to perform 10 square breathings, at least 3-4 times per day.  - Reviewed that with practice, we can train our body to calm down during stressful events.  Extensive education provided to patient today regarding the importance of training the mind and body to calm down from panic.  - Advised that patient has control over this; he needs to train himself how to react and respond better to stress.    2.  Significant Blood Pressure/ HR Elevation with increasing anxiety including Chest pressure / panic attacks - Blood pressure not optimally controlled when patient is stressed. - Per patient, confirms that when he doesn't feel stressed or tense, he does not experience any chest tightness, SOB, etc. again states that his chest tightness is never induced by activity and relieved with rest.  It is only induced with stress and relieved when his stress decreases.  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 Keep log and bring in next OV.    3.  Insomnia\difficulty sleeping - Patient sleep has improved some since starting on the Prozac.   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   -There was questions about what drugs he can take or not take with his CDL license today and prior days.    I explained to patient today that I am not a certified CDL medical examiner and in order to answer that question he would need to seek the advice and counsel of a CDL certified medical examiner    4. Lifestyle & Preventative Health  Maintenance  -Advised patient to obtain life coach\ counselor for CBT so that he may develop skills to deal with his anxiety and how his body reacts to stress.   - Advised patient to continue working toward exercising to improve overall mental, physical, and emotional health.    - Encouraged patient to engage in daily physical activity, especially a formal exercise routine.  Recommended that the patient eventually strive for at least 150 minutes of moderate cardiovascular activity per week according to guidelines established by the [facility].   - Healthy dietary habits encouraged, including low-carb, and high amounts of lean protein in diet.   - Patient should also consume adequate amounts of water.   Education and routine counseling performed. Handouts provided.  Pt was in the office today for 32.5+ minutes, with over 50% time spent in face to face counseling of patients various medical conditions, treatment plans of those medical conditions including medicine management and lifestyle modification, strategies to improve health and well being; and in coordination of care. SEE ABOVE TREATMENT PLAN FOR DETAILS   - Patient knows to call in sooner if desired to address acute concerns.   Medications Discontinued During This Encounter  Medication Reason  . Olopatadine HCl (PAZEO) 0.7 % SOLN Patient Preference  . traZODone (DESYREL) 50 MG tablet Patient Preference  . FLUoxetine (PROZAC) 10 MG tablet      Meds ordered this encounter  Medications  . propranolol (INDERAL) 20 MG tablet    Sig: Take 0.5-1 tablets (10-20 mg total) by mouth 4 (four) times daily. Prn stress response    Dispense:  120 tablet    Refill:  1  . FLUoxetine (PROZAC) 20 MG tablet    Sig: 1 tablet daily    Dispense:  90 tablet    Refill:  1    Gross side effects, risk and benefits, and alternatives of medications and treatment plan in general discussed with patient.  Patient is aware that all medications have potential side  effects and we are unable to predict every side effect or drug-drug interaction that may occur.   Patient will call with any questions prior to using medication if they have concerns.  Expresses verbal understanding and consents to current therapy and treatment regimen.  No barriers to understanding were identified.  Red flag symptoms and signs discussed in detail.  Patient expressed understanding regarding what to do in case of emergency\urgent symptoms  Please see AVS handed out to patient at the end of our visit for further patient instructions/ counseling done pertaining to today's office visit.   Return for 2) f/up 4 wks- inc prozac and started propranolol.     Note:  This document was prepared using Dragon voice recognition software and may include unintentional dictation errors.  This document serves as a record of services personally performed by [name], DO. It was created on her behalf by Toni Amend, a trained medical scribe. The creation of this record is based  on the scribe's personal observations and the provider's statements to them.   I have reviewed the above medical documentation for accuracy and completeness and I concur.  [name], DO 08/25/2018 6:08 PM      ---------------------------------------------------------------------------------------    Subjective:    CC:  Chief Complaint  Patient presents with  . Hypertension    HPI: Ralph Mack is a 52 y.o. male who presents to [place] at [facility] today for follow-up of mood.   Patient has been off work since Saturday. August 22, 2018.  Would like to resume work on August 28, 2018.  Recent Panic Attack Patient has been taking Prozac since Friday.  He cannot take trazodone.  Notes that the Prozac has helped him to feel calmer.    States he's "better today than he was Saturday."  Had a panic attack.  "Started getting agitated, could hardly speak."  Notes he was  angry and upset because the Friday prior, patient's estranged cousin got his phone number and called him.  Says that the feeling of panic attack reminded him of when he almost OD'ed on cocaine in the past.  He was sitting and driving at the time he experienced the panic attack.  Explains today that he is having an issue with his cousin.  Notes he cut this cousin out of his life.  "She's like my little sister, we grew up together," but she has issues with drugs and such.    Denies chest pain on exertion.   Sleep: -Has improved some since starting the Prozac.   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  I guess he checked with his work or a Engineering geologist   Blood Pressure  He's been measuring his blood pressures periodically.  -  His blood pressure has not been optimally controlled at home.  Pt is checking it at home.  Says he's taking his blood pressure at random times per day.  139/93 163/93 151/98 This past Sunday Sunday, after a couple of mimosas, which made him relaxed 115/85. On Saturday, his blood pressure was 194/104 when stressed.  - Patient reports good compliance with blood pressure medications  - Denies medication S-E.   - Smoking Status noted; current every day smoker.  - He denies new onset of: chest pain, exercise intolerance, shortness of breath, dizziness, visual changes, headache, lower extremity swelling or claudication.   Last 3 blood pressure readings in our office are as follows: BP Readings from Last 3 Encounters:  08/25/18 117/79  08/17/18 (!) 168/108  10/14/17 (!) 148/94    Filed Weights   08/25/18 1127  Weight: 241 lb 12.8 oz (109.7 kg)     Depression screen [facility] 2/9 08/25/2018 08/17/2018 10/14/2017  Decreased Interest 2 2 1   Down, Depressed, Hopeless 2 2 1   PHQ - 2 Score 4 4 2   Altered sleeping 2 2 2   Tired, decreased energy 2 3 2    Change in appetite 2 2 0  Feeling bad or failure about yourself  2 0 1  Trouble concentrating 2 0 1  Moving slowly or fidgety/restless 2 2 0  Suicidal thoughts 2 0 0  PHQ-9 Score 18 13 8   Difficult doing work/chores Somewhat difficult Somewhat difficult Somewhat difficult     GAD 7 : Generalized Anxiety Score 08/25/2018 08/17/2018  Nervous, Anxious, on Edge 2 2  Control/stop worrying 2  2  Worry too much - different things 2 2  Trouble relaxing 2 2  Restless 2 2  Easily annoyed or irritable 2 2  Afraid - awful might happen 2 2  Total GAD 7 Score 14 14  Anxiety Difficulty Somewhat difficult Somewhat difficult     Wt Readings from Last 3 Encounters:  09/06/18 241 lb 12.8 oz (109.7 kg)  08/17/18 246 lb (111.6 kg)  10/14/17 260 lb (117.9 kg)   BP Readings from Last 3 Encounters:  09-06-18 117/79  08/17/18 (!) 168/108  10/14/17 (!) 148/94   Pulse Readings from Last 3 Encounters:  Sep 06, 2018 80  08/17/18 80  10/14/17 79   BMI Readings from Last 3 Encounters:  09-06-2018 37.87 kg/m  08/17/18 38.53 kg/m  10/14/17 40.72 kg/m         Patient Care Team    Relationship Specialty Notifications Start End  [name], DO PCP - General Family Medicine  09/26/16   Melida Quitter, MD Consulting Physician Otolaryngology  09/26/16   Kennith Gain, MD Consulting Physician Allergy  09/26/16    Comment: [address] [place].   Gatha Mayer, MD Consulting Physician Gastroenterology  11/21/16      Patient Active Problem List   Diagnosis Date Noted  . Hypothyroidism 10/24/2016    Priority: High  . OAB (overactive bladder) 10/24/2016    Priority: High  . Prediabetes 09/05/2015    Priority: High  . Hypertriglyceridemia 09/05/2015    Priority: High  . Obesity, Class II, BMI 35-39.9, isolated 07/26/2015    Priority: High  . Smoker 07/26/2015    Priority: High  . Fatigue 10/24/2016    Priority: Medium  . Alcohol use 09/26/2016    Priority: Medium  .  History of substance abuse ([place]) 09/26/2016    Priority: Medium  . Gout 07/26/2015    Priority: Medium  . Urinary frequency 10/24/2016    Priority: Low  . Low libido 09/26/2016    Priority: Low  . Environmental and seasonal allergies 09/26/2016    Priority: Low  . Family hx of colon cancer requiring screening colonoscopy 09/26/2016    Priority: Low  . Vitamin D deficiency 09/05/2015    Priority: Low  . Stress-related physiological response affecting physical condition 2018/09/06  . Death of family member 09-06-18  . Sleeping difficulty 08/17/2018  . Acute reaction to situational stress 08/17/2018  . Irritability and anger 08/17/2018  . Low testosterone in male 08/17/2018  . Elevated blood pressure reading- w/o dx HTN 10/14/2017  . White coat syndrome without diagnosis of hypertension 10/14/2017  . Hx of colonic polyp 11/13/2016  . Hematochezia 09/26/2016  . Screening for colon cancer 09/26/2016  . Recurrent sinusitis 07/26/2015  . Cervical disc disease 07/26/2015    Past Medical history, Surgical history, Family history, Social history, Allergies and Medications have been entered into the medical record, reviewed and changed as needed.    Current Meds  Medication Sig  . allopurinol (ZYLOPRIM) 300 MG tablet TAKE ONE TABLET BY MOUTH ONCE DAILY  . Cholecalciferol (VITAMIN D3) 5000 units TABS 5,000 IU OTC vitamin D3 daily.  . fexofenadine (ALLEGRA) 180 MG tablet Take 180 mg by mouth daily.  Marland Kitchen FLUoxetine (PROZAC) 20 MG tablet 1 tablet daily  . fluticasone (FLONASE) 50 MCG/ACT nasal spray Place 2 sprays into the nose daily.  Marland Kitchen ipratropium (ATROVENT) 0.03 % nasal spray Place 2 sprays into both nostrils 3 (three) times daily.  Marland Kitchen levothyroxine (SYNTHROID, LEVOTHROID) 50 MCG tablet TAKE 1 TABLET  BY MOUTH ONCE DAILY BEFORE BREAKFAST  . Omega-3 Fatty Acids (FISH OIL) 1200 MG CAPS 2 p.o. twice daily  . [DISCONTINUED] FLUoxetine (PROZAC) 10 MG tablet Take 0.5 tablets (5 mg total) by  mouth daily for 6 days, THEN 1 tablet (10 mg total) daily for 6 days, THEN 2 tablets (20 mg total) daily for 18 days. Use one half tablet daily for 1 week then increase to 1 tablet daily.   Current Facility-Administered Medications for the 08/25/18 encounter (Office Visit) with [name], DO  Medication  . 0.9 %  sodium chloride infusion    Allergies:  No Known Allergies   Review of Systems: Review of Systems: General:   No F/C, wt loss Pulm:   No DIB, SOB, pleuritic chest pain Card:  No CP, palpitations Abd:  No n/v/d or pain Ext:  No inc edema from baseline Psych: no SI/ HI    Objective:   Blood pressure 117/79, pulse 80, temperature 98.2 F (36.8 C), height 5\' 7"  (1.702 m), weight 241 lb 12.8 oz (109.7 kg), SpO2 99 %. Body mass index is 37.87 kg/m. General:  Well Developed, well nourished, appropriate for stated age.  Neuro:  Alert and oriented,  extra-ocular muscles intact  HEENT:  Normocephalic, atraumatic, neck supple, no carotid bruits appreciated  Skin:  no gross rash, warm, pink. Cardiac:  RRR, S1 S2 Respiratory:  ECTA B/L and A/P, Not using accessory muscles, speaking in full sentences- unlabored. Vascular:  Ext warm, no cyanosis apprec.; cap RF less 2 sec. Psych:  No HI/SI, judgement and insight good, Euthymic mood. Full Affect.

## 2018-08-25 NOTE — Telephone Encounter (Signed)
Patient was seen in the office today. MPulliam, CMA/RT(R)

## 2018-08-25 NOTE — Patient Instructions (Addendum)
I want you to do Cooks Hook-up and square breathing 4-5 times per day.  10 square breathing each time.    -Also we need to transition your brain into thinking more positively.  These tasks below are some things I want you to do every day 1)  write 3 new things that you are grateful for every day for 21 days  2)  exercise daily- walk for 15 minutes twice a day every day 3)  you are going to journal every day about one positive experience that you had 4)  meditate every day.  You can go on YouTube and look for 15-minute relaxation meditation or what ever.  But we need to make sure that you are in the moment and relaxing and deep breathing every day 5)  Write 1 positive email every day to praise someone in your life     - If you have insomnia or difficulty sleeping, this information is for you:  - Avoid caffeinated beverages after lunch,  no alcoholic beverages,  no eating within 2-3 hours of lying down,  avoid exposure to blue light before bed,  avoid daytime naps, and  needs to maintain a regular sleep schedule- go to sleep and wake up around the same time every night.   - Resolve concerns or worries before entering bedroom:  Discussed relaxation techniques with patient and to keep a journal to write down fears\ worries.  I suggested seeing a counselor for CBT.   - Recommend patient meditate or do deep breathing exercises to help relax.   Incorporate the use of white noise machines or listen to "sleep meditation music", or recordings of guided meditations for sleep from YouTube which are free, such as  "guided meditation for detachment from over thinking"  by Mayford Knife.          Behavioral Health/ Counseling Referrals    Anderson Malta, personal counselor in [place], specializing in marriage counseling    Dr. Tomi Bamberger, PHD Dr. Tomi Bamberger, PHD is a counselor in [place], [place].  [address] [place], [place] [address] [place] [phone]   Kristie Cowman, [place]  [phone] [email or web address] ( she does [place] and faith-based coaching and counseling )    [employer]- ( faith-based counseling ) Address: [address]. [place],  [address] [phone] [address] for appointments [phone] Fax Hours: Monday - Thursday 8:00am-6:00pm Closed for lunch 12-1Thursday only Friday: Closed all day   Steffanie Rainwater: [phone] or Meg [phone] -counselors in [place] who are faith based   -Also Ms. Marya Fossa -  behavioral medicine. Darrick Meigs based counseling.    [facility] Nunzio Cobbs, LCSW, ACSW, M.ED.  -Nunzio Cobbs is a licensed clinical social worker in practice over 35 years and with Dr. Chucky May for the last 10 years.  -She sees adults, adolescents, children & families and couples. -Services are provided for mood and anxiety disorders, marital issues, family or parent/child problems, parenting, co-dependency, gender issues, trauma, grief, and stages of life issues. She also provides critical incident stress debriefing.  -Pamala Hurry accepts many employee assistance programs (EAP), [employer], Pharmacist, hospital.  PHONE  [phone]                FAX [phone]   Rodena [email or web address] [employer]- gen counseling;  PHD   Wilber Oliphant, MSW [address] [place]  [facility] Apolonio Schneiders, PhD [address], [facility] [phone]   [facility]- children [address], [place], [place] [phone]   Lanelle Bal Professional Counselor Counseling and [employer] [place] abuse [facility] Manager [address]  [place] [phone] [place] [address], [place] Delmer Islam, PhD **   -adult, adolescent, and child Oneida Arenas, PhD Leitha Bleak, LCSW Jillene Bucks, PhD-child, adolescent and adults   [facility] [address], [place] [phone] Merilyn Baba, MS-child, adolescent and adults Lennart Pall, PhD-adolescent and adults   [facility]-grief, terminal illness [place], [place] [address], [place] [facility] [address] [place], [place]   [facility] [address], [place], [place] [phone]   [facility] [address], [place] [phone]   [facility] [address], [place]   [facility] [address], [place] [address], [place]

## 2018-08-25 NOTE — Telephone Encounter (Signed)
Patient called back states to tell Melissa that [employer] rep advised him that as long as form is completed & clearly says he can return to work on Friday, August 28 2018 if symptoms/ illness controlled & pt feeling okay.  ----Forwarding message to medical assistant .  --Dion Body

## 2018-08-26 NOTE — Telephone Encounter (Signed)
Paperwork has been faxed and patient aware. MPulliam, CMA/RT(R)

## 2018-08-27 ENCOUNTER — Encounter: Payer: Self-pay | Admitting: Family Medicine

## 2018-08-31 ENCOUNTER — Telehealth: Payer: Self-pay

## 2018-08-31 ENCOUNTER — Encounter (HOSPITAL_COMMUNITY): Payer: Self-pay | Admitting: Emergency Medicine

## 2018-08-31 ENCOUNTER — Ambulatory Visit (HOSPITAL_COMMUNITY)
Admission: EM | Admit: 2018-08-31 | Discharge: 2018-08-31 | Disposition: A | Discharge status: Home / Self Care | Payer: BLUE CROSS/BLUE SHIELD | Attending: Internal Medicine | Admitting: Internal Medicine

## 2018-08-31 ENCOUNTER — Telehealth: Payer: Self-pay | Admitting: Family Medicine

## 2018-08-31 ENCOUNTER — Other Ambulatory Visit: Payer: Self-pay

## 2018-08-31 DIAGNOSIS — F411 Generalized anxiety disorder: Secondary | ICD-10-CM | POA: Diagnosis not present

## 2018-08-31 DIAGNOSIS — I10 Essential (primary) hypertension: Secondary | ICD-10-CM | POA: Diagnosis not present

## 2018-08-31 MED ORDER — AMLODIPINE BESYLATE 5 MG PO TABS: 5.0000 mg | ORAL_TABLET | Freq: Every day | ORAL | 0 refills | Status: DC

## 2018-08-31 NOTE — Telephone Encounter (Signed)
Patient is requesting call back from nurse about paperwork from [facility] and some items that were not filled out to employer's specifications.  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 So he is hoping he can get something else he can take at night that will hold him through the day.  Patient is requesting call back to discuss this further and can be reached at [phone]. He sent message last week while office was closed that he states can be disregarded as this information is more up to date with his situation.

## 2018-08-31 NOTE — Discharge Instructions (Addendum)
If your blood pressure is more than 150/90, take the whole 5 mg of the Amlodipine. If below 149/90, then only take 1/2 of it.  Keep your appointment with your family Dr on Ralph Mack.  Your repeated vital sigs are BP 148/68, P- 64, PO2 97%

## 2018-08-31 NOTE — Telephone Encounter (Signed)
Called [employer], forms are still being reviewed (not sure what needs to be corrected if anything).  Patient is currently at the ED for elevated BP see note in chart.  Will call patient tomorrow. MPulliam, CMA/RT(R)

## 2018-08-31 NOTE — ED Triage Notes (Addendum)
Pt's father recently passed away on 08-26-2023.  Pt states he tried to go back to work right away, but he has been having some issues with possible depression/anxiety.  He reports shaking, hot flashes and agitation.  Pt was seen by his PCP on 08/25/18 and he was put on Prozac and Propranolol.  He has a phone call into his PCP today at 1121 that has not been answered yet.  Pt has specific needs for his job related to his BP medications and the current BP med does not seem to be regulating it.  His wife also thinks he needs to be on something stronger than Prozac for his anxiety.

## 2018-08-31 NOTE — Telephone Encounter (Signed)
Patient's wife called and states that patient's blood pressure is currently 198/116.  Patient denies any chest pain, SOB, or numbness or tingling.  Denies any headaches.  Patient's wife advised that patient should go to the ED as soon as possible. MPulliam, CMA/RT(R)

## 2018-08-31 NOTE — ED Provider Notes (Signed)
[facility]    CSN: [id number] Arrival date & time: 08/31/18  1533     History   Chief Complaint Chief Complaint  Patient presents with  . Hypertension    HPI Ralph Mack is a 52 y.o. male.    Pt is here due to persistent BP elevation and his PCP told him to come in here today. His father died recently. [employer] does not allow him to take prn medication, so he cant take the Propanolol prn. 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  This am 138/ 94 Yesterday BP 165-170/ 90's On 11/22 felt hot and was having chest pains described as tightness on his L chest. Denies pain radiating to L face and L neck. He tried to calm down and his BP 194/104 and did not seek care. Denies SOB at rest or when active. Denies CP since then. He does snore and has not had a sleep test. Has poor sleep during the day due to urinary frequency.  Has been placed on medication for this ,but does not help, so needs to see urology.  Had angiogram 2009 and had 30% blockage in one vessel.  He has been on a Keto diet x 1 month and has lost 14 lbs. Had labs last month and they are improving.  Has been on FMLA this week.      Past Medical History:  Diagnosis Date  . Allergy   . Arthritis   . Hx of colonic polyp 11/13/2016   10/2016 7 mm ssp/a - recall 2023  . Hypothyroidism   . Prediabetes   . Recurrent upper respiratory infection (URI)     Patient Active Problem List   Diagnosis Date Noted  . Stress-related physiological response affecting physical condition 2018-09-13  . Death of family member 09-13-2018  . Sleeping difficulty 08/17/2018  . Acute reaction to situational stress 08/17/2018  . Irritability and anger 08/17/2018  . Low testosterone in male 08/17/2018  . Elevated blood pressure reading- w/o dx HTN 10/14/2017  . White coat syndrome without diagnosis of hypertension  10/14/2017  . Hx of colonic polyp 11/13/2016  . Hypothyroidism 10/24/2016  . Urinary frequency 10/24/2016  . Fatigue 10/24/2016  . OAB (overactive bladder) 10/24/2016  . Low libido 09/26/2016  . Environmental and seasonal allergies 09/26/2016  . Alcohol use 09/26/2016  . History of substance abuse ([place]) 09/26/2016  . Hematochezia 09/26/2016  . Family hx of colon cancer requiring screening colonoscopy 09/26/2016  . Screening for colon cancer 09/26/2016  . Prediabetes 09/05/2015  . Vitamin D deficiency 09/05/2015  . Hypertriglyceridemia 09/05/2015  . Gout 07/26/2015  . Obesity, Class II, BMI 35-39.9, isolated 07/26/2015  . Smoker 07/26/2015  . Recurrent sinusitis 07/26/2015  . Cervical disc disease 07/26/2015    Past Surgical History:  Procedure Laterality Date  . NO PAST SURGERIES         Home Medications    Prior to Admission medications   Medication Sig Start Date End Date Taking? Authorizing Provider  allopurinol (ZYLOPRIM) 300 MG tablet TAKE ONE TABLET BY MOUTH ONCE DAILY 12/26/17  Yes Plonk, Gwyndolyn Saxon, MD  fexofenadine (ALLEGRA) 180 MG tablet Take 180 mg by mouth daily.   Yes [provider]  FLUoxetine (PROZAC) 20 MG tablet 1 tablet daily 2018/09/13  Yes Opalski, Deborah, DO  levothyroxine (SYNTHROID, LEVOTHROID) 50 MCG tablet TAKE 1 TABLET  BY MOUTH ONCE DAILY BEFORE BREAKFAST 03/30/18  Yes Opalski, Deborah, DO  Omega-3 Fatty Acids (FISH OIL) 1200 MG CAPS 2 p.o. twice daily 10/14/17  Yes Opalski, Neoma Laming, DO  propranolol (INDERAL) 20 MG tablet Take 0.5-1 tablets (10-20 mg total) by mouth 4 (four) times daily. Prn stress response 08/25/18  Yes Opalski, Neoma Laming, DO  Cholecalciferol (VITAMIN D3) 5000 units TABS 5,000 IU OTC vitamin D3 daily. 10/24/16   Opalski, Deborah, DO  fluticasone (FLONASE) 50 MCG/ACT nasal spray Place 2 sprays into the nose daily.    [provider]  ipratropium (ATROVENT) 0.03 % nasal spray Place 2 sprays into both nostrils 3 (three) times  daily. 10/31/16   Kennith Gain, MD    Family History Family History  Problem Relation Age of Onset  . Asthma Mother   . COPD Mother   . Cancer Mother        skin  . Heart disease Father   . Parkinson's disease Father   . Depression Daughter   . Diabetes Paternal Grandfather   . Kidney disease Paternal Grandfather   . Colon polyps Neg Hx   . Colon cancer Neg Hx   . Esophageal cancer Neg Hx   . Stomach cancer Neg Hx   . Rectal cancer Neg Hx     Social History Social History   Tobacco Use  . Smoking status: Current Every Day Smoker    Packs/day: 1.00    Years: 30.00    Pack years: 30.00    Types: Cigarettes  . Smokeless tobacco: Never Used  Substance Use Topics  . Alcohol use: Yes    Alcohol/week: 20.0 standard drinks    Types: 20 Cans of beer per week  . Drug use: No     Allergies   Patient has no known allergies.   Review of Systems Review of Systems  Constitutional: Negative for activity change, appetite change and unexpected weight change.  Respiratory: Negative for cough, chest tightness and shortness of breath.   Cardiovascular: Negative for chest pain and leg swelling.  Genitourinary: Positive for frequency. Negative for dysuria.       + nocturia  Skin: Negative for rash.  Neurological: Negative for dizziness, speech difficulty, weakness and headaches.       Tips of index and thumb get cold off and on.  Psychiatric/Behavioral: Positive for sleep disturbance. The patient is nervous/anxious.        Cries esily. + snores     Physical Exam Triage Vital Signs ED Triage Vitals  Enc Vitals Group     BP 08/31/18 1640 (!) 189/98     Pulse Rate 08/31/18 1640 (!) 57     Resp --      Temp 08/31/18 1640 99 F (37.2 C)     Temp Source 08/31/18 1640 Oral     SpO2 08/31/18 1640 94 %     Weight --      Height --      Head Circumference --      Peak Flow --      Pain Score 08/31/18 1645 0     Pain Loc --      Pain Edu? --      Excl. in [place]? --    Repeated BP after laying on his L side for 10 min 148/68. PO2= 97% No data found.  Updated Vital Signs BP (!) 189/98 (BP Location: Left Arm)   Pulse (!) 57   Temp 99 F (37.2 C) (Oral)  SpO2 94%   Visual Acuity Right Eye Distance:   Left Eye Distance:   Bilateral Distance:    Right Eye Near:   Left Eye Near:    Bilateral Near:     Physical Exam  Constitutional: He is oriented to person, place, and time. He appears well-developed and well-nourished. No distress.  HENT:  Head: Normocephalic.  Right Ear: External ear normal.  Left Ear: External ear normal.  Nose: Nose normal.  Eyes: Conjunctivae are normal.  Neck: Neck supple. No thyromegaly present.  No carotid bruits  Cardiovascular: Normal rate and regular rhythm.  No murmur heard. No edema  Pulmonary/Chest: Effort normal and breath sounds normal. No respiratory distress.  Musculoskeletal: Normal range of motion.  Lymphadenopathy:    He has no cervical adenopathy.  Neurological: He is alert and oriented to person, place, and time.  Skin: Skin is warm and dry. No rash noted. He is not diaphoretic.  Psychiatric: His behavior is normal. Judgment and thought content normal.  Was anxious when he arrived. He almost cried a couple of times when I was getting ready to discharge him  Nursing note and vitals reviewed.   Constitutional: She is oriented to person, place, and time. She appears well-developed and well-nourished. No distress.  HENT:  Head: Normocephalic and atraumatic.  Right Ear: External ear normal.  Left Ear: External ear normal.  Nose: Nose normal.  Eyes: Conjunctivae are normal. Right eye exhibits no discharge. Left eye exhibits no discharge. No scleral icterus.  Neck: Neck supple. No thyromegaly present.  No carotid bruits bilaterally  Cardiovascular: Normal rate and regular rhythm.  No murmur heard. Pulmonary/Chest: Effort normal and breath sounds normal. No respiratory distress.  Musculoskeletal:  Normal range of motion. She exhibits no edema.  Lymphadenopathy:    She has no cervical adenopathy.  Neurological: She is alert and oriented to person, place, and time.  Skin: Skin is warm and dry. Capillary refill takes less than 2 seconds. No rash noted. She is not diaphoretic.  Psychiatric: She has a normal mood and affect. Her behavior is normal. Judgment and thought content normal.  Nursing note reviewed.   UC Treatments / Results  Labs (all labs ordered are listed, but only abnormal results are displayed) Labs Reviewed - No data to display  EKG NSR, L anterior fascicular block. No ST depression noted. See scanned copy.   Radiology No results found.  Medications Ordered in UC Medications - No data to display  Initial Impression / Assessment and Plan / UC Course  I have reviewed the triage vital signs and the nursing notes. I gave him instructions how to take the Norvasc 5 mg  I am prescribing til he sees his PCP on Wed. See his instructions.  Needs to continue the instructions he was given by his PCP how to calm himself down.  Final Clinical Impressions(s) / UC Diagnoses   Final diagnoses:  None   Discharge Instructions   None    ED Prescriptions    None     Controlled Substance Prescriptions [place] Controlled Substance Registry consulted?    Shelby [place], [place] 08/31/18 1754

## 2018-09-01 NOTE — Telephone Encounter (Signed)
Called patient unable to reach. Patient had appointment tomorrow. MPulliam, CMA/RT(R)

## 2018-09-02 ENCOUNTER — Encounter: Payer: Self-pay | Admitting: Family Medicine

## 2018-09-02 ENCOUNTER — Ambulatory Visit (INDEPENDENT_AMBULATORY_CARE_PROVIDER_SITE_OTHER): Payer: BLUE CROSS/BLUE SHIELD | Admitting: Family Medicine

## 2018-09-02 VITALS — BP 148/96 | HR 56 | Temp 98.0°F | Ht 67.0 in | Wt 241.0 lb

## 2018-09-02 DIAGNOSIS — J3089 Other allergic rhinitis: Secondary | ICD-10-CM

## 2018-09-02 DIAGNOSIS — Z8679 Personal history of other diseases of the circulatory system: Secondary | ICD-10-CM

## 2018-09-02 DIAGNOSIS — F4389 Other reactions to severe stress: Secondary | ICD-10-CM

## 2018-09-02 DIAGNOSIS — R454 Irritability and anger: Secondary | ICD-10-CM | POA: Diagnosis not present

## 2018-09-02 DIAGNOSIS — I10 Essential (primary) hypertension: Secondary | ICD-10-CM

## 2018-09-02 DIAGNOSIS — F39 Unspecified mood [affective] disorder: Secondary | ICD-10-CM

## 2018-09-02 DIAGNOSIS — F17200 Nicotine dependence, unspecified, uncomplicated: Secondary | ICD-10-CM

## 2018-09-02 DIAGNOSIS — F1020 Alcohol dependence, uncomplicated: Secondary | ICD-10-CM

## 2018-09-02 DIAGNOSIS — F430 Acute stress reaction: Secondary | ICD-10-CM | POA: Diagnosis not present

## 2018-09-02 DIAGNOSIS — G479 Sleep disorder, unspecified: Secondary | ICD-10-CM

## 2018-09-02 DIAGNOSIS — R079 Chest pain, unspecified: Secondary | ICD-10-CM

## 2018-09-02 DIAGNOSIS — F438 Other reactions to severe stress: Secondary | ICD-10-CM

## 2018-09-02 DIAGNOSIS — F1411 Cocaine abuse, in remission: Secondary | ICD-10-CM

## 2018-09-02 MED ORDER — HYDROCHLOROTHIAZIDE 12.5 MG PO CAPS: 12.5000 mg | ORAL_CAPSULE | Freq: Every day | ORAL | 1 refills | Status: DC

## 2018-09-02 MED ORDER — LOSARTAN POTASSIUM 50 MG PO TABS: 50.0000 mg | ORAL_TABLET | Freq: Every day | ORAL | 1 refills | Status: DC

## 2018-09-02 MED ORDER — FLUOXETINE HCL 60 MG PO TABS: ORAL_TABLET | ORAL | 0 refills | Status: DC

## 2018-09-02 MED ORDER — LOSARTAN POTASSIUM-HCTZ 50-12.5 MG PO TABS: 1.0000 | ORAL_TABLET | Freq: Every day | ORAL | 0 refills | Status: DC

## 2018-09-02 MED ORDER — PROPRANOLOL HCL 20 MG PO TABS: 20.0000 mg | ORAL_TABLET | Freq: Four times a day (QID) | ORAL | 1 refills | Status: DC

## 2018-09-02 NOTE — Patient Instructions (Addendum)
Ralph Mack for [employer].  Please begin taking two tablets of "old" Prozac at home (up to 40 mg daily). Take the 2 tablets of "old" Prozac (40 mg) every day for about a week.  See how you feel. If you have no side effects on 40 mg of Prozac, and it's been 4-5 days, increase to 60 mg Prozac at that time.  Otherwise, increase to 60 mg of Prozac after one week.  We referred you to cardiology as well as psychiatry today.  If you have not heard anything about these referrals by next Monday, please give [name] a call as he is our referral coordinator.    I want you to do [employer]-up and square breathing 4-5 times per day.  10 square breathing each time.    -Also we need to transition your brain into thinking more positively.  These tasks below are some things I want you to do every day 1)  write 3 new things that you are grateful for every day for 21 days  2)  exercise daily- walk for 15 minutes twice a day every day 3)  you are going to journal every day about one positive experience that you had 4)  meditate every day.  You can go on YouTube and look for 15-minute relaxation meditation or what ever.  But we need to make sure that you are in the moment and relaxing and deep breathing every day 5)  Write 1 positive email every day to praise someone in your life     - If you have insomnia or difficulty sleeping, this information is for you:  - Avoid caffeinated beverages after lunch,  no alcoholic beverages,  no eating within 2-3 hours of lying down,  avoid exposure to blue light before bed,  avoid daytime naps, and  needs to maintain a regular sleep schedule- go to sleep and wake up around the same time every night.   - Resolve concerns or worries before entering bedroom:  Discussed relaxation techniques with patient and to keep a journal to write down fears\ worries.  I suggested seeing a counselor for CBT.   - Recommend patient meditate or do deep  breathing exercises to help relax.   Incorporate the use of white noise machines or listen to "sleep meditation music", or recordings of guided meditations for sleep from YouTube which are free, such as  "guided meditation for detachment from over thinking"  by Mayford Knife.

## 2018-09-02 NOTE — Telephone Encounter (Signed)
Patient was seen in the office today and paperwork was discussed. MPulliam, CMA/RT(R)

## 2018-09-02 NOTE — Progress Notes (Addendum)
Impression and Recommendations:    1. Stress-related physiological response affecting physical condition   2. Sleeping difficulty   3. Acute reaction to situational stress   4. Irritability and anger   5. Smoker   6. Uncomplicated alcohol dependence ([place])   7. Environmental and seasonal allergies   8. HTN (hypertension), malignant   9. Mood disorder ([place])   10. Nonspecific chest pain   11. Personal history of coronary artery disease   12. H/O cocaine abuse ([place])     - Patient counseled in exam room extensively today.  Discussed developments regarding mood, blood pressure, and cardiac concerns at length with patient.  1. Poorly controlled BP, h/o CAD - Per pt, experiencing non-specific chest pain that appears to be stress related.  Today patient tells [place] he's potentially had a 30% blockage in his heart in the past.  - Blood pressure remains poorly controlled at this time. - Need for specialist care at this time.  - Referral placed today to Cardiology.  Patient with poorly controlled blood pressure and history of coronary artery disease.  - Discussed referral to Cardiology & Hypertension Clinic at length with patient today.  - Ambulatory BP monitoring encouraged. Keep log and bring in next OV  - Take medications as recommended.  See med list today. - Propanolol 20 mg 4 times daily. - Continue Norvasc as prescribed by Urgent Care. - Losartan-HCTZ added, 50-12.5 to start.  - Otherwise, patient tolerating meds well without complication.  Denies S-E.  2. Stress-related Psychological Response Affecting Physical Condition - Last appointment, we increased patient's Prozac, strongly encouraged patient to bring in BP log, and discussed how patient's blood pressure appears to be emotionally mediated.  Prescribed propanolol to blunt patient's sympathetic response.  - Symptoms remain uncontrolled at this time. - Increased dose of Prozac today, 60 mg daily.   See med list.  -  Need for specialist care at this time. - Referral placed today.  - Discussed that given the severity of his symptoms and reaction to stress, patient needs to establish with counselor/life coach.  Discussed that patient may benefit from establishing with Psychiatry.  - Reviewed at length that patient needs to talk to someone face-to-face and receive cognitive behavioral therapy, to get to the crux of the problem and relieve it.  - Discussed that exercise can help to reduce stress.  - Reviewed the "spokes of the wheel" of mood and health management.  Stressed the importance of ongoing prudent habits, including regular exercise, appropriate sleep hygiene, healthful dietary habits, and prayer/meditation to relax.  3. Lifestyle & Preventative Health Maintenance - Advised patient to continue working toward exercising to improve overall mental, physical, and emotional health.    - Encouraged patient to engage in daily physical activity, especially a formal exercise routine.  Recommended that the patient eventually strive for at least 150 minutes of moderate cardiovascular activity per week according to guidelines established by the [facility].   - Healthy dietary habits encouraged, including low-carb, and high amounts of lean protein in diet.   - Patient should also consume adequate amounts of water.   Education and routine counseling performed. Handouts provided.  Pt was interviewed and evaluated by me in the clinic today for 32.5+ minutes, with over 50% time spent in face to face counseling of patients various medical conditions, treatment plans of those medical conditions including medicine management and lifestyle modification, strategies to improve health and well being; and in coordination of care. SEE ABOVE  TREATMENT PLAN FOR DETAILS   Orders Placed This Encounter  Procedures  . Ambulatory referral to Psychiatry  . Ambulatory referral to Cardiology    Medications Discontinued During This  Encounter  Medication Reason  . FLUoxetine (PROZAC) 20 MG tablet   . propranolol (INDERAL) 20 MG tablet      Meds ordered this encounter  Medications  . FLUoxetine 60 MG TABS    Sig: 1 tablet daily    Dispense:  90 tablet    Refill:  0    Further RF per his psychiatrist  . propranolol (INDERAL) 20 MG tablet    Sig: Take 1 tablet (20 mg total) by mouth 4 (four) times daily.    Dispense:  120 tablet    Refill:  1  . losartan-hydrochlorothiazide (HYZAAR) 50-12.5 MG tablet    Sig: Take 1 tablet by mouth daily.    Dispense:  90 tablet    Refill:  0    RF per Cardiology  . losartan (COZAAR) 50 MG tablet    Sig: Take 1 tablet (50 mg total) by mouth daily.    Dispense:  90 tablet    Refill:  1  . hydrochlorothiazide (MICROZIDE) 12.5 MG capsule    Sig: Take 1 capsule (12.5 mg total) by mouth daily.    Dispense:  90 capsule    Refill:  1    Gross side effects, risk and benefits, and alternatives of medications and treatment plan in general discussed with patient.  Patient is aware that all medications have potential side effects and we are unable to predict every side effect or drug-drug interaction that may occur.   Patient will call with any questions prior to using medication if they have concerns.  Expresses verbal understanding and consents to current therapy and treatment regimen.  No barriers to understanding were identified.  Red flag symptoms and signs discussed in detail.  Patient expressed understanding regarding what to do in case of emergency\urgent symptoms  Please see AVS handed out to patient at the end of our visit for further patient instructions/ counseling done pertaining to today's office visit.   Return for 2) f/up 4wks- 13min appt; and prn.     Note:  This document was prepared using Dragon voice recognition software and may include unintentional dictation errors.  This document serves as a record of services personally performed by [name], DO. It was  created on her behalf by Toni Amend, a trained medical scribe. The creation of this record is based on the scribe's personal observations and the provider's statements to them.   I have reviewed the above medical documentation for accuracy and completeness and I concur.  [name], DO      -------------------------------------------------------------------------------   Subjective:    CC:  Chief Complaint  Patient presents with  . Follow-up    HPI: Ralph Mack is a 52 y.o. male who presents to [place] [facility] today for follow-up of mood.   History of Cardiac Care & Blockage Went to Urgent Care on Monday, two days ago.  Notes he had an EKG done.  Also reports history of 5% or 30% blockage (history unclear) in October or November of 2009, with Dr. Clarisa Fling with [facility] on [address] in [place].  Patient confirms today that he is concerned about his heart, and wants to make sure his nonspecific chest pain is stress related.   Mood - Acute Stress Reaction Last appointment, patient's dose of  Prozac was increased.  Patient was strongly encouraged to bring in BP log, and evaluate whether his BP is emotionally mediated.  Propanolol was prescribed to blunt patient's sympathetic response.  Notes the increase in prozac did not help.  Had another panic attack last night at 12 o'clock.    Patient reports that he has been using propanolol every six hours.  However, states that he has not seen a difference in his blood pressure, and it's still running high.  He would like to go back to work by the 20th of December if he can get his blood pressure to go down.  Notes that he has no appetite and "doesn't even want to put the effort into eating."  States "I feel like I have no desire to do anything."  Thinks his daughter sees psychiatrist Dr. Hulan Fray at [facility] practice.  Patient states that his daughter suffers from depression.  1. HTN  HPI:  -  His blood pressure has not been controlled at home.  Pt has been checking it at home.   He has not been exercising because he wants to wait until his blood pressure drops.  141/87 last night is lowest BP measurement.  170/100 at noon during very bad day yesterday.  Patient states he woke up, and when he sat down "it was like I just lost my puppy."  Notes "I had to cry, and I felt stuck to my chair, and started getting a cold sweat.  And I started just feeling just sad."  BP Measurements: 149/101 163/93 with HR of 71 151/98 and HR 75 139/93 no HR 154/96 no HR 198/116 with HR of 58  - Patient reports good compliance with blood pressure medications  - Denies medication S-E   - Smoking Status noted   - He denies new onset of: chest pain, exercise intolerance, shortness of breath, dizziness, visual changes, headache, lower extremity swelling or claudication.   Last 3 blood pressure readings in our office are as follows: BP Readings from Last 3 Encounters:  09/02/18 (!) 161/100  08/31/18 (!) 189/98  08/25/18 117/79    Filed Weights   09/02/18 1153  Weight: 241 lb (109.3 kg)     Depression screen [facility] 2/9 08/25/2018 08/17/2018 10/14/2017  Decreased Interest 2 2 1   Down, Depressed, Hopeless 2 2 1   PHQ - 2 Score 4 4 2   Altered sleeping 2 2 2   Tired, decreased energy 2 3 2   Change in appetite 2 2 0  Feeling bad or failure about yourself  2 0 1  Trouble concentrating 2 0 1  Moving slowly or fidgety/restless 2 2 0  Suicidal thoughts 2 0 0  PHQ-9 Score 18 13 8   Difficult doing work/chores Somewhat difficult Somewhat difficult Somewhat difficult     GAD 7 : Generalized Anxiety Score 08/25/2018 08/17/2018  Nervous, Anxious, on Edge 2 2  Control/stop worrying 2 2  Worry too much - different things 2 2  Trouble relaxing 2 2  Restless 2 2  Easily annoyed or irritable 2 2  Afraid - awful might happen 2 2  Total GAD 7 Score 14 14  Anxiety Difficulty Somewhat  difficult Somewhat difficult     Wt Readings from Last 3 Encounters:  09/02/18 241 lb (109.3 kg)  08/25/18 241 lb 12.8 oz (109.7 kg)  08/17/18 246 lb (111.6 kg)   BP Readings from Last 3 Encounters:  09/02/18 (!) 161/100  08/31/18 (!) 189/98  08/25/18 117/79   Pulse Readings from Last  3 Encounters:  09/02/18 (!) 56  08/31/18 (!) 57  September 10, 2018 80   BMI Readings from Last 3 Encounters:  09/02/18 37.75 kg/m  10-Sep-2018 37.87 kg/m  08/17/18 38.53 kg/m         Patient Care Team    Relationship Specialty Notifications Start End  [name], DO PCP - General Family Medicine  09/26/16   Melida Quitter, MD Consulting Physician Otolaryngology  09/26/16   Kennith Gain, MD Consulting Physician Allergy  09/26/16    Comment: [address] [place].   Gatha Mayer, MD Consulting Physician Gastroenterology  11/21/16      Patient Active Problem List   Diagnosis Date Noted  . Hypothyroidism 10/24/2016    Priority: High  . OAB (overactive bladder) 10/24/2016    Priority: High  . Prediabetes 09/05/2015    Priority: High  . Hypertriglyceridemia 09/05/2015    Priority: High  . Obesity, Class II, BMI 35-39.9, isolated 07/26/2015    Priority: High  . Smoker 07/26/2015    Priority: High  . Fatigue 10/24/2016    Priority: Medium  . Alcohol use 09/26/2016    Priority: Medium  . History of substance abuse ([place]) 09/26/2016    Priority: Medium  . Gout 07/26/2015    Priority: Medium  . Urinary frequency 10/24/2016    Priority: Low  . Low libido 09/26/2016    Priority: Low  . Environmental and seasonal allergies 09/26/2016    Priority: Low  . Family hx of colon cancer requiring screening colonoscopy 09/26/2016    Priority: Low  . Vitamin D deficiency 09/05/2015    Priority: Low  . HTN (hypertension), malignant 09/02/2018  . Stress-related physiological response affecting physical condition 09-10-18  . Death of family member 09/10/2018  . Sleeping  difficulty 08/17/2018  . Acute reaction to situational stress 08/17/2018  . Irritability and anger 08/17/2018  . Low testosterone in male 08/17/2018  . Elevated blood pressure reading- w/o dx HTN 10/14/2017  . White coat syndrome without diagnosis of hypertension 10/14/2017  . Hx of colonic polyp 11/13/2016  . Hematochezia 09/26/2016  . Screening for colon cancer 09/26/2016  . Recurrent sinusitis 07/26/2015  . Cervical disc disease 07/26/2015    Past Medical history, Surgical history, Family history, Social history, Allergies and Medications have been entered into the medical record, reviewed and changed as needed.    Current Meds  Medication Sig  . allopurinol (ZYLOPRIM) 300 MG tablet TAKE ONE TABLET BY MOUTH ONCE DAILY  . amLODipine (NORVASC) 5 MG tablet Take 1 tablet (5 mg total) by mouth daily.  . Cholecalciferol (VITAMIN D3) 5000 units TABS 5,000 IU OTC vitamin D3 daily.  . fexofenadine (ALLEGRA) 180 MG tablet Take 180 mg by mouth daily.  Marland Kitchen FLUoxetine 60 MG TABS 1 tablet daily  . fluticasone (FLONASE) 50 MCG/ACT nasal spray Place 2 sprays into the nose daily.  Marland Kitchen ipratropium (ATROVENT) 0.03 % nasal spray Place 2 sprays into both nostrils 3 (three) times daily.  Marland Kitchen levothyroxine (SYNTHROID, LEVOTHROID) 50 MCG tablet TAKE 1 TABLET BY MOUTH ONCE DAILY BEFORE BREAKFAST  . Omega-3 Fatty Acids (FISH OIL) 1200 MG CAPS 2 p.o. twice daily  . propranolol (INDERAL) 20 MG tablet Take 1 tablet (20 mg total) by mouth 4 (four) times daily.  . [DISCONTINUED] FLUoxetine (PROZAC) 20 MG tablet 1 tablet daily  . [DISCONTINUED] propranolol (INDERAL) 20 MG tablet Take 0.5-1 tablets (10-20 mg total) by mouth 4 (four) times daily. Prn stress response   Current Facility-Administered Medications for the  09/02/18 encounter (Office Visit) with [name], DO  Medication  . 0.9 %  sodium chloride infusion    Allergies:  No Known Allergies   Review of Systems: Review of Systems: General:   No  F/C, wt loss Pulm:   No DIB, SOB, pleuritic chest pain Card:  No CP, palpitations Abd:  No n/v/d or pain Ext:  No inc edema from baseline Psych: no SI/ HI    Objective:   Blood pressure (!) 161/100, pulse (!) 56, temperature 98 F (36.7 C), height 5\' 7"  (1.702 m), weight 241 lb (109.3 kg), SpO2 98 %. Body mass index is 37.75 kg/m. General:  Well Developed, well nourished, appropriate for stated age.  Neuro:  Alert and oriented,  extra-ocular muscles intact  HEENT:  Normocephalic, atraumatic, neck supple, no carotid bruits appreciated  Skin:  no gross rash, warm, pink. Cardiac:  RRR, S1 S2 Respiratory:  ECTA B/L and A/P, Not using accessory muscles, speaking in full sentences- unlabored. Vascular:  Ext warm, no cyanosis apprec.; cap RF less 2 sec. Psych:  No HI/SI, judgement and insight good, Euthymic mood. Full Affect.

## 2018-09-07 NOTE — Progress Notes (Signed)
Cardiology Office Note   Date:  09/11/2018   ID:  Ralph Mack, DOB 1966-04-02, MRN [id number]  PCP:  [name], DO  Cardiologist:   No primary care provider on file. Referring:  [name], DO  No chief complaint on file.     History of Present Illness: Ralph Mack is a 52 y.o. male who is referred by [name], DO for management of difficult to control hypertension and chest pain.  He has no past cardiac history other than a cardiac catheterization in 2009 in [place].  I was able to review this.  This was for chest discomfort.  He had minimal coronary plaque with 10% PDA stenosis.  There was also mild obtuse marginal stenosis.  Stenosis.  His ejection fraction was well controlled.    He says he is been under a lot of stress recently.  His father died.  He has stress at work.  He has been having what he thinks might be panic attack.  In fact his primary provider has recently diagnosed him with possible PTSD and anxiety.  He is being managed for this.  His blood pressure has been labile and he brings some blood pressure reports that I reviewed.  There have been blood pressures of 198/109 or so.  He is now on medications as listed and his blood pressure seems to be a little bit better but still spikes when he is stressed.  He has had some sporadic chest discomfort.  One episode happened while he was working.  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  He developed this left upper sternal discomfort.  It was a pressure-like sensation.  This was more severe than usual.  Is been having the same discomfort for 10 years off and on sporadically.  However, this was significant.  He is not sure how long it lasted but it was quite a while.  He had trouble letting a calm down before he could drive back to work.  He is now off of work because of these episodes.  He intermittently gets this chest pressure.  Is not describing radiation.  He will get nauseated.  He is not having any  acute shortness of breath with this.  He is not describing PND or orthopnea.  He is very panicked and anxious about this which he thinks brings on more blood pressure and chest discomfort.  Past Medical History:  Diagnosis Date  . Allergy   . Arthritis   . Hx of colonic polyp 11/13/2016   10/2016 7 mm ssp/a - recall 2023  . Hypothyroidism   . Prediabetes   . Recurrent upper respiratory infection (URI)     Past Surgical History:  Procedure Laterality Date  . CORONARY ANGIOGRAM  2009  . NO PAST SURGERIES       Current Outpatient Medications  Medication Sig Dispense Refill  . allopurinol (ZYLOPRIM) 300 MG tablet TAKE ONE TABLET BY MOUTH ONCE DAILY 90 tablet 3  . amLODipine (NORVASC) 5 MG tablet Take 1 tablet (5 mg total) by mouth daily. 30 tablet 0  . Cholecalciferol (VITAMIN D3) 5000 units TABS 5,000 IU OTC vitamin D3 daily. 90 tablet 3  . Coenzyme Q10 (CO Q 10) 100 MG CAPS Take 3 capsules by mouth daily.    . fexofenadine (ALLEGRA) 180 MG tablet Take 180 mg by mouth daily.    Marland Kitchen FLUoxetine 60 MG TABS 1 tablet daily 90 tablet 0  . fluticasone (FLONASE) 50 MCG/ACT nasal  spray Place 2 sprays into the nose daily.    . hydrochlorothiazide (MICROZIDE) 12.5 MG capsule Take 1 capsule (12.5 mg total) by mouth daily. 90 capsule 1  . levothyroxine (SYNTHROID, LEVOTHROID) 50 MCG tablet TAKE 1 TABLET BY MOUTH ONCE DAILY BEFORE BREAKFAST 90 tablet 1  . losartan (COZAAR) 50 MG tablet Take 1 tablet (50 mg total) by mouth daily. 90 tablet 1  . Omega-3 Fatty Acids (FISH OIL) 1200 MG CAPS 2 p.o. twice daily    . Potassium 99 MG TABS Take 3-4 tablets by mouth daily.    . propranolol (INDERAL) 20 MG tablet Take 1 tablet (20 mg total) by mouth 4 (four) times daily. 120 tablet 1  . Zinc 50 MG CAPS Take 1 capsule by mouth daily.     Current Facility-Administered Medications  Medication Dose Route Frequency Provider Last Rate Last Dose  . 0.9 %  sodium chloride infusion  500 mL Intravenous Continuous  Gatha Mayer, MD        Allergies:   Patient has no known allergies.    Social History:  The patient  reports that he has been smoking cigarettes. He has a 30.00 pack-year smoking history. He has never used smokeless tobacco. He reports current alcohol use of about 20.0 standard drinks of alcohol per week. He reports that he does not use drugs.   Family History:  The patient's family history includes Asthma in his mother; COPD in his mother; Cancer in his mother; Depression in his daughter; Diabetes in his paternal grandfather; Heart disease in his father; Kidney disease in his paternal grandfather; Parkinson's disease in his father.    ROS:  Please see the history of present illness.   Otherwise, review of systems are positive for none.   All other systems are reviewed and negative.    PHYSICAL EXAM: VS:  BP (!) 130/96   Pulse (!) 57   Ht 5\' 7"  (1.702 m)   Wt 245 lb 6.4 oz (111.3 kg)   BMI 38.44 kg/m  , BMI Body mass index is 38.44 kg/m. GENERAL:  Well appearing HEENT:  Pupils equal round and reactive, fundi not visualized, oral mucosa unremarkable NECK:  No jugular venous distention, waveform within normal limits, carotid upstroke brisk and symmetric, no bruits, no thyromegaly LYMPHATICS:  No cervical, inguinal adenopathy LUNGS:  Clear to auscultation bilaterally BACK:  No CVA tenderness CHEST:  Unremarkable HEART:  PMI not displaced or sustained,S1 and S2 within normal limits, no S3, no S4, no clicks, no rubs, no murmurs ABD:  Flat, positive bowel sounds normal in frequency in pitch, no bruits, no rebound, no guarding, no midline pulsatile mass, no hepatomegaly, no splenomegaly EXT:  2 plus pulses throughout, no edema, no cyanosis no clubbing SKIN:  No rashes no nodules NEURO:  Cranial nerves II through XII grossly intact, motor grossly intact throughout PSYCH:  Cognitively intact, oriented to person place and time    EKG:  EKG is not ordered today. The ekg ordered  08/31/18 demonstrates sinus rhythm, rate 60, left axis deviation, left ventricular hypertrophy, no acute ST-T wave changes.   Recent Labs: 08/17/2018: ALT 24; BUN 12; Creatinine, Ser 0.71; Hemoglobin 15.8; Platelets 188; Potassium 4.6; Sodium 136; TSH 1.760    Lipid Panel    Component Value Date/Time   CHOL 220 (H) 08/17/2018 1152   TRIG 91 08/17/2018 1152   HDL 70 08/17/2018 1152   CHOLHDL 3.1 08/17/2018 1152   LDLCALC 132 (H) 08/17/2018 1152  Wt Readings from Last 3 Encounters:  09/11/18 245 lb 6.4 oz (111.3 kg)  09/02/18 241 lb (109.3 kg)  08/25/18 241 lb 12.8 oz (109.7 kg)      Other studies Reviewed: Additional studies/ records that were reviewed today include: cath 2009, labs EKG. Review of the above records demonstrates:  Please see elsewhere in the note.     ASSESSMENT AND PLAN:  HTN:   The blood pressure seems to be reasonably well managed right now other than when he is panic.  I agree with a multifaceted approach which includes treatment of probable panic and anxiety.  Meanwhile the blood pressure medicines he is on seems to be working.  I will continue with those but he certainly has room for adjustment.  I do not think we need to look for secondary cause at this point.  I do note that his renal function was fine.  He has had normal thyroid recently.  I be happy to make further suggestions based on future readings.  CHEST PAIN: I am going to start with a screening POET (Plain Old Exercise Treadmill).  This will allow me to look for obstructive coronary disease.  I think there is a low test probability.  This will also allow me to look for his blood pressure response.  DYSLIPIDEMIA: His LDL is 132 but his HDL 70.  For now no change in therapy.  Current medicines are reviewed at length with the patient today.  The patient  concerns regarding medicines.  The following changes have been made:  no change  Labs/ tests ordered today include:   Orders Placed This  Encounter  Procedures  . EXERCISE TOLERANCE TEST (ETT)     Disposition:   FU with me as needed.     Signed, Minus Breeding, MD  09/11/2018 10:13 AM    [facility]

## 2018-09-08 ENCOUNTER — Encounter: Payer: Self-pay | Admitting: Family Medicine

## 2018-09-11 ENCOUNTER — Encounter: Payer: Self-pay | Admitting: Cardiology

## 2018-09-11 ENCOUNTER — Ambulatory Visit (INDEPENDENT_AMBULATORY_CARE_PROVIDER_SITE_OTHER): Payer: BLUE CROSS/BLUE SHIELD | Admitting: Cardiology

## 2018-09-11 VITALS — BP 130/96 | HR 57 | Ht 67.0 in | Wt 245.4 lb

## 2018-09-11 DIAGNOSIS — E785 Hyperlipidemia, unspecified: Secondary | ICD-10-CM

## 2018-09-11 DIAGNOSIS — I10 Essential (primary) hypertension: Secondary | ICD-10-CM

## 2018-09-11 DIAGNOSIS — R079 Chest pain, unspecified: Secondary | ICD-10-CM | POA: Diagnosis not present

## 2018-09-11 NOTE — Patient Instructions (Signed)
Medication Instructions:  Continue current medications  If you need a refill on your cardiac medications before your next appointment, please call your pharmacy.  Labwork: None Ordered   If you have labs (blood work) drawn today and your tests are completely normal, you will receive your results only by [employer] (if you have MyChart) -OR- A paper copy in the mail.  If you have any lab test that is abnormal or we need to change your treatment, we will call you to review these results.  Testing/Procedures: Your physician has requested that you have an exercise tolerance test. For further information please visit [email or web address]. Please also follow instruction sheet, as given.  Follow-Up: . Your physician recommends that you schedule a follow-up appointment in: As Needed  At [facility], you and your health needs are our priority.  As part of our continuing mission to provide you with exceptional heart care, we have created designated Provider Care Teams.  These Care Teams include your primary Cardiologist (physician) and Advanced Practice Providers (APPs -  Physician Assistants and Nurse Practitioners) who all work together to provide you with the care you need, when you need it.

## 2018-09-15 ENCOUNTER — Telehealth: Payer: Self-pay | Admitting: Family Medicine

## 2018-09-15 NOTE — Telephone Encounter (Signed)
Per Dr. Raliegh Scarlet patient is at max dose that she is comfortable given.  Patient's appointment with psych is on 10/12/2018.  Patient advised to set up a counselor to talk with, mediation, walking, journal and keep record of panic attack and feelings along with physical symptoms.  Patient expressed understanding and will continue to call psych to see if they can get him in earlier. MPulliam, CMA/RT(R)

## 2018-09-15 NOTE — Telephone Encounter (Signed)
Patient called states more frequent panic attacks ( as recent at 2:30am this morning) , he wishes provider/ or her nurse to call him back with permission to increase his dosage of :    FLUoxetine 60 MG TABS [[id number]]   Order Details  Dose, Route, Frequency: As Directed Note to Pharmacy:  Further RF per his psychiatrist    Indications of Use: Depression, Panic Disorder, mood lability  Dispense Quantity: 90 tablet Refills: 0 Fills remaining: --        Sig: 1 tablet daily     --_Forwarding request to medical assistant to review w/provider & contact patient w/decision.  --glh

## 2018-09-24 ENCOUNTER — Telehealth: Payer: Self-pay | Admitting: Family Medicine

## 2018-09-24 ENCOUNTER — Other Ambulatory Visit (HOSPITAL_COMMUNITY): Payer: Self-pay | Admitting: Internal Medicine

## 2018-09-24 ENCOUNTER — Other Ambulatory Visit: Payer: Self-pay

## 2018-09-24 DIAGNOSIS — I10 Essential (primary) hypertension: Secondary | ICD-10-CM

## 2018-09-24 NOTE — Telephone Encounter (Signed)
Per Dr. Raliegh Scarlet she advises that blood pressure medication be managed by cardiology.  Patient was given Amlodipine on 08/31/2018 at the ED.  Patient is requesting a refill of medication.  Please review and advise.  Thank you

## 2018-09-24 NOTE — Telephone Encounter (Signed)
Patient is requesting a refill on Amlodipine. Medication was started on 08/31/18 by the hospital. LOV 09/02/18.  Please review and advise. MPulliam, CMA/RT(R)

## 2018-09-24 NOTE — Telephone Encounter (Signed)
Sent request to Dr. Raliegh Scarlet to review and advise. MPulliam, CMA/RT(R)

## 2018-09-24 NOTE — Telephone Encounter (Signed)
He is seeing  Dr Percival [facility] - he was referred to him for uncontrolled BP here at our clinic recently.   He is scheduled for a stress test with them as well in near future.    ( See his last OV note - under assess/ plan for details. )  Until his BP is under better control and once released by cardiology, we can go back to [place] these meds once no longer under their care

## 2018-09-24 NOTE — Telephone Encounter (Signed)
Patient called request Rx refill on :   amLODipine (NORVASC) 5 MG tablet [[id number]]   Order Details  Dose: 5 mg Route: Oral Frequency: Daily  Dispense Quantity: 30 tablet Refills: 0 Fills remaining: --        Sig: Take 1 tablet (5 mg total) by mouth daily.       Written Date: 08/31/18 Expiration Date: 08/31/19    Start Date: 08/31/18 End Date: --         Ordering Provider: Colin Benton DEA #:  [id number] NPI:  [id number]      ---Per patient ( Other Provider prescribed this meds & he is unsure if Dr. Raliegh Scarlet wants him to continue taking, but if she does then a Rx refill needs to be authorized by her per pharmacy.  ---Forwarding request to medical assistant to review w/provider  & call into :   [place] [facility] [address] ([address]), [place] [address] [phone] (Phone) [phone] (Fax)   --glh

## 2018-09-25 ENCOUNTER — Telehealth (HOSPITAL_COMMUNITY): Payer: Self-pay

## 2018-09-25 ENCOUNTER — Telehealth: Payer: Self-pay | Admitting: Family Medicine

## 2018-09-25 MED ORDER — AMLODIPINE BESYLATE 5 MG PO TABS: 5.0000 mg | ORAL_TABLET | Freq: Every day | ORAL | 1 refills | Status: DC

## 2018-09-25 NOTE — Telephone Encounter (Signed)
Encounter complete. 

## 2018-10-01 ENCOUNTER — Ambulatory Visit (HOSPITAL_COMMUNITY)
Admission: RE | Admit: 2018-10-01 | Discharge: 2018-10-01 | Disposition: A | Discharge status: Home / Self Care | Payer: BLUE CROSS/BLUE SHIELD | Source: Ambulatory Visit | Attending: Cardiology | Admitting: Cardiology

## 2018-10-01 DIAGNOSIS — R079 Chest pain, unspecified: Secondary | ICD-10-CM | POA: Insufficient documentation

## 2018-10-01 LAB — EXERCISE TOLERANCE TEST
CHL RATE OF PERCEIVED EXERTION: 19
Estimated workload: 8.5 METS
Exercise duration (min): 7 min
Exercise duration (sec): 0 s
MPHR: 166 {beats}/min
Peak HR: 144 {beats}/min
Percent HR: 86 %
Rest HR: 78 {beats}/min

## 2018-10-02 ENCOUNTER — Other Ambulatory Visit: Payer: Self-pay | Admitting: Family Medicine

## 2018-10-02 DIAGNOSIS — E039 Hypothyroidism, unspecified: Secondary | ICD-10-CM

## 2018-10-05 ENCOUNTER — Encounter: Payer: Self-pay | Admitting: Family Medicine

## 2018-10-05 ENCOUNTER — Ambulatory Visit (INDEPENDENT_AMBULATORY_CARE_PROVIDER_SITE_OTHER): Payer: BLUE CROSS/BLUE SHIELD | Admitting: Family Medicine

## 2018-10-05 VITALS — BP 118/72 | HR 56 | Temp 97.9°F | Ht 67.0 in | Wt 231.2 lb

## 2018-10-05 DIAGNOSIS — F39 Unspecified mood [affective] disorder: Secondary | ICD-10-CM | POA: Diagnosis not present

## 2018-10-05 DIAGNOSIS — F17200 Nicotine dependence, unspecified, uncomplicated: Secondary | ICD-10-CM

## 2018-10-05 DIAGNOSIS — E669 Obesity, unspecified: Secondary | ICD-10-CM

## 2018-10-05 DIAGNOSIS — F430 Acute stress reaction: Secondary | ICD-10-CM

## 2018-10-05 DIAGNOSIS — R0789 Other chest pain: Secondary | ICD-10-CM | POA: Diagnosis not present

## 2018-10-05 DIAGNOSIS — F4389 Other reactions to severe stress: Secondary | ICD-10-CM

## 2018-10-05 DIAGNOSIS — I10 Essential (primary) hypertension: Secondary | ICD-10-CM | POA: Diagnosis not present

## 2018-10-05 DIAGNOSIS — F438 Other reactions to severe stress: Secondary | ICD-10-CM

## 2018-10-05 DIAGNOSIS — E66812 Obesity, class 2: Secondary | ICD-10-CM

## 2018-10-05 DIAGNOSIS — F411 Generalized anxiety disorder: Secondary | ICD-10-CM | POA: Insufficient documentation

## 2018-10-05 MED ORDER — SERTRALINE HCL 25 MG PO TABS: 25.0000 mg | ORAL_TABLET | Freq: Every day | ORAL | 0 refills | Status: DC

## 2018-10-05 NOTE — Progress Notes (Signed)
Impression and Recommendations:    1. HTN (hypertension), malignant   2. Mood disorder (HCC) Chronic  3. Obesity, Class II, BMI 35-39.9, isolated   4. Other chest pain   5. Smoker   6. Stress-related physiological response affecting physical condition   7. Acute reaction to situational stress      1. Hypertension - No medication changes made today. - Blood pressure controlled on a regular basis, but not while patient experiences panic. - Continue treatment plan as prescribed.  See med list below. - Patient tolerating meds well without complication.  Denies S-E  - Return in 3-4 months.  - Lifestyle changes such as dash diet and engaging in a regular exercise program extensively discussed with patient.  Educational handouts provided PRN.  - Regular daily ambulatory BP monitoring encouraged.  Keep log and bring in next OV.  2. Cardiology - Recent Stress Test - Patient recently had a stress test performed through cardiology.  Cardiologist Dr. Danella Deis sent a note and stated this stress test went well and that the patient should follow up with cardiology PRN.  3. Psychiatry - Mood Management - Encouraged patient to follow up with psychiatrist ASAP as recommended. - Begin low dose Zoloft 25 mg daily, and begin weaning down from Prozac. - Advised patient to refer to psychiatry for medication management in the future.  - Encouraged patient to get set up with a counselor to discuss his various concerns.  - Educated patient at length about potential ongoing causes of stress.  All questions were answered.  - Reviewed the "spokes of the wheel" of mood and health management.  Stressed the importance of ongoing prudent habits, including regular exercise, appropriate sleep hygiene, healthful dietary habits, and prayer/meditation to relax.  - STRONGLY encouraged patient and his wife to attend counseling together.  4. Smoking Cessation - Encouraged patient to continue with smoking  cessation goals. - Patient notes that a pack of cigarettes now lasts him five days.  Told pt to think seriously about quitting smoking!  Told pt it is very important for his/her health and well being.    Smoking cessation instruction/counseling given:  counseled patient on the dangers of tobacco use, advised patient to stop smoking, and reviewed strategies to maximize success  Discussed with patient that there are multiple treatments to aid in quitting smoking, however I explained none will work unless pt really want to quit  Told to call 1-800-QUIT-NOW (847)458-3485) for free smoking cessation counseling and support, or pt can go online to www.heart.org - the American Heart Association website and search "quit smoking ".   5. BMI Counseling Explained to patient what BMI refers to, and what it means medically.    Told patient to think about it as a "medical risk stratification measurement" and how increasing BMI is associated with increasing risk/ or worsening state of various diseases such as hypertension, hyperlipidemia, diabetes, premature OA, depression etc.  American Heart Association guidelines for healthy diet, basically Mediterranean diet, and exercise guidelines of 30 minutes 5 days per week or more discussed in detail.  Health counseling performed.  All questions answered.  6. Lifestyle & Preventative Health Maintenance - Advised patient to continue working toward exercising to improve overall mental, physical, and emotional health.    - Encouraged patient to engage in daily physical activity, especially a formal exercise routine, walking 30 minutes per day.  Recommended that the patient eventually strive for at least 150 minutes of moderate cardiovascular activity per week  according to guidelines established by the [facility].   - Healthy dietary habits encouraged, including low-carb, and high amounts of lean protein in diet.   - Patient should also consume adequate amounts of  water.   Education and routine counseling performed. Handouts provided.   Pt was interviewed and evaluated by me in the clinic today for 32.5+ minutes, with over 50% time spent in face to face counseling of patients various medical conditions, treatment plans of those medical conditions including medicine management and lifestyle modification, strategies to improve health and well being; and in coordination of care. SEE ABOVE TREATMENT PLAN FOR DETAILS   Meds ordered this encounter  Medications  . sertraline (ZOLOFT) 25 MG tablet    Sig: Take 1 tablet (25 mg total) by mouth daily.    Dispense:  30 tablet    Refill:  0    RF's/ med changes per his psychiatrist in future    Gross side effects, risk and benefits, and alternatives of medications and treatment plan in general discussed with patient.  Patient is aware that all medications have potential side effects and we are unable to predict every side effect or drug-drug interaction that may occur.   Patient will call with any questions prior to using medication if they have concerns.  Expresses verbal understanding and consents to current therapy and treatment regimen.  No barriers to understanding were identified.  Red flag symptoms and signs discussed in detail.  Patient expressed understanding regarding what to do in case of emergency\urgent symptoms  Please see AVS handed out to patient at the end of our visit for further patient instructions/ counseling done pertaining to today's office visit.   Return for BP check in 60mo or so, f/up sooner if needed.     Note:  This document was prepared using Dragon voice recognition software and may include unintentional dictation errors.  This document serves as a record of services personally performed by [name], DO. It was created on her behalf by Toni Amend, a trained medical scribe. The creation of this record is based on the scribe's personal observations and the provider's  statements to them.   I have reviewed the above medical documentation for accuracy and completeness and I concur.  [name], DO 10/05/2018 6:11 PM      ----------------------------------------------------------------------------------------------------------------------------------    Subjective:    CC:  Chief Complaint  Patient presents with  . Follow-up    HPI: Ralph Mack is a 53 y.o. male who presents to [place] at [facility] today for follow-up of mood.  Hasn't been to the psychiatrist yet.  Notes that he has continued to be very stressed.  Patient has been having ongoing episodes of high blood pressure and extreme anxiety.  Continues having panic attacks with feelings of tingling or "zapping in the chest."  States he's been having a "lot of stress for the past 10 years."  He's scared to go back to work because he's scared to get behind the wheel, scared to drive the truck again.  Feels that when the panic comes on, "it's like a freight train and I can't stop it."  Patient notes that his wife currently drives him everywhere because he fears getting behind the wheel.  Says "I feel like I've been in a fog," explaining that he's begun wearing glasses.  Continues on Prozac.  Says "I have no desire to do anything," including hobbies.  Notes he didn't feel like getting out of bed until  noon-time yesterday.  Has noticed that he has been quicker to anger lately.  Says that he thinks 75% of the issue is with his marital situation.  Notes that his wife has a hard time keeping down a job and this frustrates him because she doesn't "seem to really give a damn."  Patient describes his marital concerns at length, in depth during appointment today.  Notes that they have had many "ups and downs" over the years.  1. HTN HPI:  -  His blood pressure has been more controlled at home.  Pt is checking it at home, but usually checking while experiencing feelings of  panic.  He has been trying to walk on the treadmill more regularly, "not every day."  141/87, pulse of 61. On a regular basis, 127/79, pulse 65. 128/81, pulse 62.  Had 5 days of no panic attacks from December 25 - January 1.  Says that before these few days of relief occurred, he prayed to God for a break from his panic attacks.  - Patient reports good compliance with blood pressure medications  - Denies medication S-E   - Smoking Status noted; continues smoking.  Notes that a pack of cigarettes now lasts him five days instead of one.  - He denies new onset of: chest pain, exercise intolerance, shortness of breath, dizziness, visual changes, headache, lower extremity swelling or claudication.   Last 3 blood pressure readings in our office are as follows: BP Readings from Last 3 Encounters:  10/05/18 118/72  09/11/18 (!) 130/96  09/09/18 (!) 148/96    Filed Weights   10/05/18 1001  Weight: 231 lb 3.2 oz (104.9 kg)     Depression screen [facility] 2/9 10/05/2018 08/25/2018 08/17/2018  Decreased Interest 2 2 2   Down, Depressed, Hopeless 2 2 2   PHQ - 2 Score 4 4 4   Altered sleeping 2 2 2   Tired, decreased energy 2 2 3   Change in appetite 2 2 2   Feeling bad or failure about yourself  2 2 0  Trouble concentrating 0 2 0  Moving slowly or fidgety/restless 2 2 2   Suicidal thoughts - 2 0  PHQ-9 Score 14 18 13   Difficult doing work/chores Somewhat difficult Somewhat difficult Somewhat difficult     GAD 7 : Generalized Anxiety Score 10/05/2018 08/25/2018 08/17/2018  Nervous, Anxious, on Edge 2 2 2   Control/stop worrying 2 2 2   Worry too much - different things 2 2 2   Trouble relaxing 2 2 2   Restless 2 2 2   Easily annoyed or irritable 3 2 2   Afraid - awful might happen 2 2 2   Total GAD 7 Score 15 14 14   Anxiety Difficulty Very difficult Somewhat difficult Somewhat difficult     Wt Readings from Last 3 Encounters:  10/05/18 231 lb 3.2 oz (104.9 kg)  09/11/18 245 lb 6.4 oz (111.3  kg)  09/02/18 241 lb (109.3 kg)   BP Readings from Last 3 Encounters:  10/05/18 118/72  09/11/18 (!) 130/96  09/09/18 (!) 148/96   Pulse Readings from Last 3 Encounters:  10/05/18 (!) 56  09/11/18 (!) 57  09/02/18 (!) 56   BMI Readings from Last 3 Encounters:  10/05/18 36.21 kg/m  09/11/18 38.44 kg/m  09/02/18 37.75 kg/m         Patient Care Team    Relationship Specialty Notifications Start End  [name], DO PCP - General Family Medicine  09/26/16   Melida Quitter, MD Consulting Physician Otolaryngology  09/26/16   Nelva Bush,  Rae Halsted, MD Consulting Physician Allergy  09/26/16    Comment: [address] [place].   Gatha Mayer, MD Consulting Physician Gastroenterology  11/21/16      Patient Active Problem List   Diagnosis Date Noted  . Hypothyroidism 10/24/2016    Priority: High  . OAB (overactive bladder) 10/24/2016    Priority: High  . Prediabetes 09/05/2015    Priority: High  . Hypertriglyceridemia 09/05/2015    Priority: High  . Obesity, Class II, BMI 35-39.9, isolated 07/26/2015    Priority: High  . Smoker 07/26/2015    Priority: High  . Fatigue 10/24/2016    Priority: Medium  . Alcohol use 09/26/2016    Priority: Medium  . History of substance abuse ([place]) 09/26/2016    Priority: Medium  . Gout 07/26/2015    Priority: Medium  . Urinary frequency 10/24/2016    Priority: Low  . Low libido 09/26/2016    Priority: Low  . Environmental and seasonal allergies 09/26/2016    Priority: Low  . Family hx of colon cancer requiring screening colonoscopy 09/26/2016    Priority: Low  . Vitamin D deficiency 09/05/2015    Priority: Low  . Mood disorder ([place]) 10/05/2018  . Dyslipidemia 09/11/2018  . Essential hypertension 09/11/2018  . Chest pain 09/11/2018  . HTN (hypertension), malignant 09/02/2018  . Stress-related physiological response affecting physical condition 08/28/18  . Death of family member 08/28/2018  . Sleeping difficulty  08/17/2018  . Acute reaction to situational stress 08/17/2018  . Irritability and anger 08/17/2018  . Low testosterone in male 08/17/2018  . Elevated blood pressure reading- w/o dx HTN 10/14/2017  . White coat syndrome without diagnosis of hypertension 10/14/2017  . Hx of colonic polyp 11/13/2016  . Hematochezia 09/26/2016  . Screening for colon cancer 09/26/2016  . Recurrent sinusitis 07/26/2015  . Cervical disc disease 07/26/2015    Past Medical history, Surgical history, Family history, Social history, Allergies and Medications have been entered into the medical record, reviewed and changed as needed.    Current Meds  Medication Sig  . allopurinol (ZYLOPRIM) 300 MG tablet TAKE ONE TABLET BY MOUTH ONCE DAILY  . amLODipine (NORVASC) 5 MG tablet Take 1 tablet (5 mg total) by mouth daily.  . Cholecalciferol (VITAMIN D3) 5000 units TABS 5,000 IU OTC vitamin D3 daily.  . Coenzyme Q10 (CO Q 10) 100 MG CAPS Take 3 capsules by mouth daily.  . fexofenadine (ALLEGRA) 180 MG tablet Take 180 mg by mouth daily.  Marland Kitchen FLUoxetine 60 MG TABS 1 tablet daily  . fluticasone (FLONASE) 50 MCG/ACT nasal spray Place 2 sprays into the nose daily.  . hydrochlorothiazide (MICROZIDE) 12.5 MG capsule Take 1 capsule (12.5 mg total) by mouth daily.  Marland Kitchen levothyroxine (SYNTHROID, LEVOTHROID) 50 MCG tablet TAKE 1 TABLET BY MOUTH ONCE DAILY BEFORE BREAKFAST  . losartan (COZAAR) 50 MG tablet Take 1 tablet (50 mg total) by mouth daily.  . Omega-3 Fatty Acids (FISH OIL) 1200 MG CAPS 2 p.o. twice daily  . Potassium 99 MG TABS Take 3-4 tablets by mouth daily.  . propranolol (INDERAL) 20 MG tablet Take 1 tablet (20 mg total) by mouth 4 (four) times daily.  . Zinc 50 MG CAPS Take 1 capsule by mouth daily.   Current Facility-Administered Medications for the 10/05/18 encounter (Office Visit) with [name], DO  Medication  . 0.9 %  sodium chloride infusion    Allergies:  No Known Allergies   Review of  Systems: Review of Systems: General:  No F/C, wt loss Pulm:   No DIB, SOB, pleuritic chest pain Card:  No CP, palpitations Abd:  No n/v/d or pain Ext:  No inc edema from baseline Psych: no SI/ HI    Objective:   Blood pressure 118/72, pulse (!) 56, temperature 97.9 F (36.6 C), height 5\' 7"  (1.702 m), weight 231 lb 3.2 oz (104.9 kg), SpO2 99 %. Body mass index is 36.21 kg/m. General:  Well Developed, well nourished, appropriate for stated age.  Neuro:  Alert and oriented,  extra-ocular muscles intact  HEENT:  Normocephalic, atraumatic, neck supple, no carotid bruits appreciated  Skin:  no gross rash, warm, pink. Cardiac:  RRR, S1 S2 Respiratory:  ECTA B/L and A/P, Not using accessory muscles, speaking in full sentences- unlabored. Vascular:  Ext warm, no cyanosis apprec.; cap RF less 2 sec. Psych:  No HI/SI, judgement and insight good, Euthymic mood. Full Affect.

## 2018-10-05 NOTE — Patient Instructions (Addendum)
We are adding Zoloft today at a low dose, but in ten days or so when you see psychiatry, they will then be the one to modify and change your medications, and be the one to decide your release to go back to work.  Please call psychiatry about getting in to see a counselor- for behavioral therapy-it may take a couple weeks to get in with somebody so try to call to make appointment now.  Your goal is to walk on the treadmill or outside every day, slowly, over time, working your way up to walking for 30-45 minutes per day.  Await to hear from cardiology regarding recent stress test.  Please make sure that cardiology feels that you do not need to follow up with them anymore. (As I do not see a reason why you should need to but they may)    Also, ask if there are any additional tests they recommend.    Panic Attack A panic attack is a sudden episode of severe anxiety, fear, or discomfort that causes physical and emotional symptoms. The attack may be in response to something frightening, or it may occur for no known reason. Symptoms of a panic attack can be similar to symptoms of a heart attack or stroke. It is important to see your health care provider when you have a panic attack so that these conditions can be ruled out. A panic attack is a symptom of another condition. Most panic attacks go away with treatment of the underlying problem. If you have panic attacks often, you may have a condition called panic disorder. What are the causes? A panic attack may be caused by:  An extreme, life-threatening situation, such as a war or natural disaster.  An anxiety disorder, such as post-traumatic stress disorder.  Depression.  Certain medical conditions, including heart problems, neurological conditions, and infections.  Certain over-the-counter and prescription medicines.  Illegal drugs that increase heart rate and blood pressure, such as methamphetamine.  Alcohol.  Supplements that increase  anxiety.  Panic disorder. What increases the risk? You are more likely to develop this condition if:  You have an anxiety disorder.  You have another mental health condition.  You take certain medicines.  You use alcohol, illegal drugs, or other substances.  You are under extreme stress.  A life event is causing increased feelings of anxiety and depression. What are the signs or symptoms? A panic attack starts suddenly, usually lasts about 20 minutes, and occurs with one or more of the following:  A pounding heart.  A feeling that your heart is beating irregularly or faster than normal (palpitations).  Sweating.  Trembling or shaking.  Shortness of breath or feeling smothered.  Feeling choked.  Chest pain or discomfort.  Nausea or a strange feeling in your stomach.  Dizziness, feeling lightheaded, or feeling like you might faint.  Chills or hot flashes.  Numbness or tingling in your lips, hands, or feet.  Feeling confused, or feeling that you are not yourself.  Fear of losing control or being emotionally unstable.  Fear of dying. How is this diagnosed? A panic attack is diagnosed with an assessment by your health care provider. During the assessment your health care provider will ask questions about:  Your history of anxiety, depression, and panic attacks.  Your medical history.  Whether you drink alcohol, use illegal drugs, take supplements, or take medicines. Be honest about your substance use. Your health care provider may also:  Order blood tests or other kinds  of tests to rule out serious medical conditions.  Refer you to a mental health professional for further evaluation. How is this treated? Treatment depends on the cause of the panic attack:  If the cause is a medical problem, your health care provider will either treat that problem or refer you to a specialist.  If the cause is emotional, you may be given anti-anxiety medicines or referred to  a counselor. These medicines may reduce how often attacks happen, reduce how severe the attacks are, and lower anxiety.  If the cause is a medicine, your health care provider may tell you to stop the medicine, change your dose, or take a different medicine.  If the cause is a drug, treatment may involve letting the drug wear off and taking medicine to help the drug leave your body or to counteract its effects. Attacks caused by drug abuse may continue even if you stop using the drug. Follow these instructions at home:  Take over-the-counter and prescription medicines only as told by your health care provider.  If you feel anxious, limit your caffeine intake.  Take good care of your physical and mental health by: ? Eating a balanced diet that includes plenty of fresh fruits and vegetables, whole grains, lean meats, and low-fat dairy. ? Getting plenty of rest. Try to get 7-8 hours of uninterrupted sleep each night. ? Exercising regularly. Try to get 30 minutes of physical activity at least 5 days a week. ? Not smoking. Talk to your health care provider if you need help quitting. ? Limiting alcohol intake to no more than 1 drink a day for nonpregnant women and 2 drinks a day for men. One drink equals 12 oz of beer, 5 oz of wine, or 1 oz of hard liquor.  Keep all follow-up visits as told by your health care provider. This is important. Panic attacks may have underlying physical or emotional problems that take time to accurately diagnose. Contact a health care provider if:  Your symptoms do not improve, or they get worse.  You are not able to take your medicine as prescribed because of side effects. Get help right away if:  You have serious thoughts about hurting yourself or others.  You have symptoms of a panic attack. Do not drive yourself to the hospital. Have someone else drive you or call an ambulance. If you ever feel like you may hurt yourself or others, or you have thoughts about  taking your own life, get help right away. You can go to your nearest emergency department or call:  Your local emergency services (911 in the [place]  A suicide crisis helpline, such as the [facility] at 204-650-7714. This is open 24 hours a day. Summary  A panic attack is a sign of a serious health or mental health condition. Get help right away. Do not drive yourself to the hospital. Have someone else drive you or call an ambulance.  Always see a health care provider to have the reasons for the panic attack correctly diagnosed.  If your panic attack was caused by a physical problem, follow your health care provider's suggestions for medicine, referral to a specialist, and lifestyle changes.  If your panic attack was caused by an emotional problem, follow through with counseling from a qualified mental health specialist.  If you feel like you may hurt yourself or others, call 911 and get help right away. This information is not intended to replace advice given to you by your  health care provider. Make sure you discuss any questions you have with your health care provider. Document Released: 09/16/2005 Document Revised: 10/25/2016 Document Reviewed: 10/25/2016 Elsevier Interactive Patient Education  2019 [place].    Generalized Anxiety Disorder, Adult Generalized anxiety disorder (GAD) is a mental health disorder. People with this condition constantly worry about everyday events. Unlike normal anxiety, worry related to GAD is not triggered by a specific event. These worries also do not fade or get better with time. GAD interferes with life functions, including relationships, work, and school. GAD can vary from mild to severe. People with severe GAD can have intense waves of anxiety with physical symptoms (panic attacks). What are the causes? The exact cause of GAD is not known. What increases the risk? This condition is more likely to develop  in:  Women.  People who have a family history of anxiety disorders.  People who are very shy.  People who experience very stressful life events, such as the death of a loved one.  People who have a very stressful family environment. What are the signs or symptoms? People with GAD often worry excessively about many things in their lives, such as their health and family. They may also be overly concerned about:  Doing well at work.  Being on time.  Natural disasters.  Friendships. Physical symptoms of GAD include:  Fatigue.  Muscle tension or having muscle twitches.  Trembling or feeling shaky.  Being easily startled.  Feeling like your heart is pounding or racing.  Feeling out of breath or like you cannot take a deep breath.  Having trouble falling asleep or staying asleep.  Sweating.  Nausea, diarrhea, or irritable bowel syndrome (IBS).  Headaches.  Trouble concentrating or remembering facts.  Restlessness.  Irritability. How is this diagnosed? Your health care provider can diagnose GAD based on your symptoms and medical history. You will also have a physical exam. The health care provider will ask specific questions about your symptoms, including how severe they are, when they started, and if they come and go. Your health care provider may ask you about your use of alcohol or drugs, including prescription medicines. Your health care provider may refer you to a mental health specialist for further evaluation. Your health care provider will do a thorough examination and may perform additional tests to rule out other possible causes of your symptoms. To be diagnosed with GAD, a person must have anxiety that:  Is out of his or her control.  Affects several different aspects of his or her life, such as work and relationships.  Causes distress that makes him or her unable to take part in normal activities.  Includes at least three physical symptoms of GAD, such  as restlessness, fatigue, trouble concentrating, irritability, muscle tension, or sleep problems. Before your health care provider can confirm a diagnosis of GAD, these symptoms must be present more days than they are not, and they must last for six months or longer. How is this treated? The following therapies are usually used to treat GAD:  Medicine. Antidepressant medicine is usually prescribed for long-term daily control. Antianxiety medicines may be added in severe cases, especially when panic attacks occur.  Talk therapy (psychotherapy). Certain types of talk therapy can be helpful in treating GAD by providing support, education, and guidance. Options include: ? Cognitive behavioral therapy (CBT). People learn coping skills and techniques to ease their anxiety. They learn to identify unrealistic or negative thoughts and behaviors and to replace them with  positive ones. ? Acceptance and commitment therapy (ACT). This treatment teaches people how to be mindful as a way to cope with unwanted thoughts and feelings. ? Biofeedback. This process trains you to manage your body's response (physiological response) through breathing techniques and relaxation methods. You will work with a therapist while machines are used to monitor your physical symptoms.  Stress management techniques. These include yoga, meditation, and exercise. A mental health specialist can help determine which treatment is best for you. Some people see improvement with one type of therapy. However, other people require a combination of therapies. Follow these instructions at home:  Take over-the-counter and prescription medicines only as told by your health care provider.  Try to maintain a normal routine.  Try to anticipate stressful situations and allow extra time to manage them.  Practice any stress management or self-calming techniques as taught by your health care provider.  Do not punish yourself for setbacks or for not  making progress.  Try to recognize your accomplishments, even if they are small.  Keep all follow-up visits as told by your health care provider. This is important. Contact a health care provider if:  Your symptoms do not get better.  Your symptoms get worse.  You have signs of depression, such as: ? A persistently sad, cranky, or irritable mood. ? Loss of enjoyment in activities that used to bring you joy. ? Change in weight or eating. ? Changes in sleeping habits. ? Avoiding friends or family members. ? Loss of energy for normal tasks. ? Feelings of guilt or worthlessness. Get help right away if:  You have serious thoughts about hurting yourself or others. If you ever feel like you may hurt yourself or others, or have thoughts about taking your own life, get help right away. You can go to your nearest emergency department or call:  Your local emergency services (911 in the [place]  A suicide crisis helpline, such as the [facility] at 406-382-9881. This is open 24 hours a day. Summary  Generalized anxiety disorder (GAD) is a mental health disorder that involves worry that is not triggered by a specific event.  People with GAD often worry excessively about many things in their lives, such as their health and family.  GAD may cause physical symptoms such as restlessness, trouble concentrating, sleep problems, frequent sweating, nausea, diarrhea, headaches, and trembling or muscle twitching.  A mental health specialist can help determine which treatment is best for you. Some people see improvement with one type of therapy. However, other people require a combination of therapies. This information is not intended to replace advice given to you by your health care provider. Make sure you discuss any questions you have with your health care provider. Document Released: 01/11/2013 Document Revised: 08/06/2016 Document Reviewed: 08/06/2016 Elsevier  Interactive Patient Education  [employer].          If you have insomnia or difficulty sleeping, this information is for you:  - Avoid caffeinated beverages after lunch,  no alcoholic beverages,  no eating within 2-3 hours of lying down,  avoid exposure to blue light before bed,  avoid daytime naps, and  needs to maintain a regular sleep schedule- go to sleep and wake up around the same time every night.   - Resolve concerns or worries before entering bedroom:  Discussed relaxation techniques with patient and to keep a journal to write down fears\ worries.  I suggested seeing a counselor for CBT.   -  Recommend patient meditate or do deep breathing exercises to help relax.   Incorporate the use of white noise machines or listen to "sleep meditation music", or recordings of guided meditations for sleep from YouTube which are free, such as  "guided meditation for detachment from over thinking"  by Mayford Knife.

## 2018-10-06 ENCOUNTER — Telehealth: Payer: Self-pay | Admitting: Cardiology

## 2018-10-06 NOTE — Telephone Encounter (Signed)
New Message   Pt states he is trying to get Dr. Percival Spanish to sign a form release him into psychiatric care and wants to bring it tomorrow. Please call

## 2018-10-06 NOTE — Telephone Encounter (Signed)
Attempted to return call to patient. Phone number listed is for [employer] - did not leave message.

## 2018-10-07 ENCOUNTER — Ambulatory Visit: Payer: Self-pay | Admitting: Family Medicine

## 2018-10-07 ENCOUNTER — Telehealth: Payer: Self-pay

## 2018-10-07 NOTE — Telephone Encounter (Signed)
He can schedule follow to discuss further if he has continued symptoms.

## 2018-10-07 NOTE — Telephone Encounter (Signed)
Pt presented to office today upset stating he never received a call in regards to his stress test and he's been stressing as to whether he is going to have a heart attack. Informed pt that based on chart review, the results were released to mychart.  Updated pt with results along with Dr. Rosezella Florida recommendation. Pt verbalized understanding but states he is still having dull sharp chest pain. He states it's not as bad as when he had his anxiety attack but has the symptoms 2-3 times a day. He report his hasn't had them the last two days but still concerned. Will route to MD for recommendations.

## 2018-10-09 NOTE — Telephone Encounter (Signed)
Pt updated with Dr. Rosezella Florida recommendation. Ok to scheduled pt for Monday 1/13 at 830 am per MD's nurse. Pt made aware.

## 2018-10-09 NOTE — Telephone Encounter (Signed)
Attempted to contact pt. Unable to leave message as the voicemail states it a company phone.

## 2018-10-09 NOTE — Telephone Encounter (Signed)
appt schedule 01/13

## 2018-10-10 NOTE — Progress Notes (Signed)
Cardiology Office Note   Date:  10/12/2018   ID:  Ralph Mack, DOB [birth year], MRN [id number]  PCP:  [name], DO  Cardiologist:   No primary care provider on file. Referring:  [name], DO  Chief Complaint  Patient presents with  . Palpitations       History of Present Illness: Ralph Mack is a 53 y.o. male who is referred by [name], DO for management of difficult to control hypertension and chest pain.  He has no past cardiac history other than a cardiac catheterization in 2009 in [place].  This was for chest discomfort.  He had minimal coronary plaque with 10% PDA stenosis.  There was also mild obtuse marginal stenosis.  Marland Kitchen  His ejection fraction was well controlled.   He had a POET (Plain Old Exercise Treadmill) without ischemia.    He is continuing to get episodes of dread and fear.  He gets episodes where he feels like he does not move at all.  At night his wife feels the bed shaking.  He feels his heart racing when he starts to go to bed.  He is not had any presyncope or syncope.  His blood pressures have been okay with the peak being 151.  For the most part seems to be well controlled.  He is not described any new chest discomfort since the episode he described previously.  He is not had any new shortness of breath, PND or orthopnea.  Past Medical History:  Diagnosis Date  . Allergy   . Arthritis   . Hx of colonic polyp 11/13/2016   10/2016 7 mm ssp/a - recall 2023  . Hypothyroidism   . Prediabetes   . Recurrent upper respiratory infection (URI)     Past Surgical History:  Procedure Laterality Date  . CORONARY ANGIOGRAM  2009  . NO PAST SURGERIES       Current Outpatient Medications  Medication Sig Dispense Refill  . allopurinol (ZYLOPRIM) 300 MG tablet TAKE ONE TABLET BY MOUTH ONCE DAILY 90 tablet 3  . amLODipine (NORVASC) 5 MG tablet Take 1 tablet (5 mg total) by mouth daily. 90 tablet 1  . Cholecalciferol (VITAMIN D3) 5000 units TABS  5,000 IU OTC vitamin D3 daily. 90 tablet 3  . Coenzyme Q10 (CO Q 10) 100 MG CAPS Take 3 capsules by mouth daily.    . fexofenadine (ALLEGRA) 180 MG tablet Take 180 mg by mouth daily.    Marland Kitchen FLUoxetine 60 MG TABS 1 tablet daily 90 tablet 0  . fluticasone (FLONASE) 50 MCG/ACT nasal spray Place 2 sprays into the nose daily.    . hydrochlorothiazide (MICROZIDE) 12.5 MG capsule Take 1 capsule (12.5 mg total) by mouth daily. 90 capsule 1  . levothyroxine (SYNTHROID, LEVOTHROID) 50 MCG tablet TAKE 1 TABLET BY MOUTH ONCE DAILY BEFORE BREAKFAST 90 tablet 0  . losartan (COZAAR) 50 MG tablet Take 1 tablet (50 mg total) by mouth daily. 90 tablet 1  . Omega-3 Fatty Acids (FISH OIL) 1200 MG CAPS 2 p.o. twice daily    . Potassium 99 MG TABS Take 3-4 tablets by mouth daily.    . propranolol (INDERAL) 20 MG tablet Take 1 tablet (20 mg total) by mouth 4 (four) times daily. 120 tablet 1  . sertraline (ZOLOFT) 25 MG tablet Take 1 tablet (25 mg total) by mouth daily. 30 tablet 0  . Zinc 50 MG CAPS Take 1 capsule by mouth daily.     Current Facility-Administered Medications  Medication Dose Route Frequency Provider Last Rate Last Dose  . 0.9 %  sodium chloride infusion  500 mL Intravenous Continuous Gatha Mayer, MD        Allergies:   Patient has no known allergies.    ROS:  Please see the history of present illness.   Otherwise, review of systems are positive for none.   All other systems are reviewed and negative.    PHYSICAL EXAM: VS:  BP 128/84   Pulse 74   Ht 5' 7.5" (1.715 m)   Wt 234 lb 6.4 oz (106.3 kg)   SpO2 95%   BMI 36.17 kg/m  , BMI Body mass index is 36.17 kg/m. GENERAL:  Well appearing NECK:  No jugular venous distention, waveform within normal limits, carotid upstroke brisk and symmetric, no bruits, no thyromegaly LUNGS:  Clear to auscultation bilaterally CHEST:  Unremarkable HEART:  PMI not displaced or sustained,S1 and S2 within normal limits, no S3, no S4, no clicks, no rubs, no  murmurs ABD:  Flat, positive bowel sounds normal in frequency in pitch, no bruits, no rebound, no guarding, no midline pulsatile mass, no hepatomegaly, no splenomegaly EXT:  2 plus pulses throughout, no edema, no cyanosis no clubbing   EKG:  EKG is not ordered today.   Recent Labs: 08/17/2018: ALT 24; BUN 12; Creatinine, Ser 0.71; Hemoglobin 15.8; Platelets 188; Potassium 4.6; Sodium 136; TSH 1.760    Lipid Panel    Component Value Date/Time   CHOL 220 (H) 08/17/2018 1152   TRIG 91 08/17/2018 1152   HDL 70 08/17/2018 1152   CHOLHDL 3.1 08/17/2018 1152   LDLCALC 132 (H) 08/17/2018 1152      Wt Readings from Last 3 Encounters:  10/12/18 234 lb 6.4 oz (106.3 kg)  10/05/18 231 lb 3.2 oz (104.9 kg)  09/11/18 245 lb 6.4 oz (111.3 kg)      Other studies Reviewed: Additional studies/ records that were reviewed today include: POET (Plain Old Exercise Treadmill) Review of the above records demonstrates:  See above.   ASSESSMENT AND PLAN:  HTN:   Blood pressure is actually well controlled.  I am to continue the meds as listed.  CHEST PAIN:  He had a negative POET (Plain Old Exercise Treadmill).  He has had no new symptoms.  No further ischemia evaluation is planned.   PALPITATIONS: He has symptoms that could be an arrhythmia and he will wear a ZIO patch.  Current medicines are reviewed at length with the patient today.  The patient  concerns regarding medicines.  The following changes have been made:  None  Labs/ tests ordered today include:   Orders Placed This Encounter  Procedures  . LONG TERM MONITOR (3-14 DAYS)     Disposition:   FU with me as needed based on the results of the above.      Signed, Minus Breeding, MD  10/12/2018 9:40 AM    [facility]

## 2018-10-12 ENCOUNTER — Encounter: Payer: Self-pay | Admitting: Cardiology

## 2018-10-12 ENCOUNTER — Encounter: Payer: Self-pay | Admitting: Psychiatry

## 2018-10-12 ENCOUNTER — Ambulatory Visit: Payer: BLUE CROSS/BLUE SHIELD | Admitting: Cardiology

## 2018-10-12 ENCOUNTER — Ambulatory Visit (INDEPENDENT_AMBULATORY_CARE_PROVIDER_SITE_OTHER): Payer: BLUE CROSS/BLUE SHIELD | Admitting: Psychiatry

## 2018-10-12 VITALS — BP 152/98 | HR 68 | Ht 67.5 in | Wt 230.0 lb

## 2018-10-12 VITALS — BP 128/84 | HR 74 | Ht 67.5 in | Wt 234.4 lb

## 2018-10-12 DIAGNOSIS — F410 Panic disorder [episodic paroxysmal anxiety] without agoraphobia: Secondary | ICD-10-CM | POA: Diagnosis not present

## 2018-10-12 DIAGNOSIS — R079 Chest pain, unspecified: Secondary | ICD-10-CM

## 2018-10-12 DIAGNOSIS — R002 Palpitations: Secondary | ICD-10-CM | POA: Diagnosis not present

## 2018-10-12 DIAGNOSIS — F411 Generalized anxiety disorder: Secondary | ICD-10-CM

## 2018-10-12 DIAGNOSIS — F32A Depression, unspecified: Secondary | ICD-10-CM

## 2018-10-12 DIAGNOSIS — F329 Major depressive disorder, single episode, unspecified: Secondary | ICD-10-CM | POA: Diagnosis not present

## 2018-10-12 MED ORDER — ALPRAZOLAM 0.5 MG PO TABS: ORAL_TABLET | ORAL | 0 refills | Status: DC

## 2018-10-12 MED ORDER — SERTRALINE HCL 100 MG PO TABS: ORAL_TABLET | ORAL | 1 refills | Status: DC

## 2018-10-12 MED ORDER — FLUOXETINE HCL 20 MG PO CAPS: 20.0000 mg | ORAL_CAPSULE | Freq: Every day | ORAL | 3 refills | Status: DC

## 2018-10-12 NOTE — Patient Instructions (Signed)
Medication Instructions:  Continue current medications  If you need a refill on your cardiac medications before your next appointment, please call your pharmacy.  Labwork: None Ordered   Take the provided lab slips with you to the lab for your blood draw.  When you have your labs (blood work) drawn today and your tests are completely normal, you will receive your results only by MyChart Message (if you have MyChart) -OR-  A paper copy in the mail.  If you have any lab test that is abnormal or we need to change your treatment, we will call you to review these results.  Testing/Procedures: Your physician has recommended that you wear an Zio Patch monitor for 14 days. Event monitors are medical devices that record the heart's electrical activity. Doctors most often [place] these monitors to diagnose arrhythmias. Arrhythmias are problems with the speed or rhythm of the heartbeat. The monitor is a small, portable device. You can wear one while you do your normal daily activities. This is usually used to diagnose what is causing palpitations/syncope (passing out).   Follow-Up: . Your physician recommends that you schedule a follow-up appointment in: As Needed base on result   At [facility], you and your health needs are our priority.  As part of our continuing mission to provide you with exceptional heart care, we have created designated Provider Care Teams.  These Care Teams include your primary Cardiologist (physician) and Advanced Practice Providers (APPs -  Physician Assistants and Nurse Practitioners) who all work together to provide you with the care you need, when you need it.  Thank you for choosing [facility] at [facility]!!
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
## 2018-10-12 NOTE — Patient Instructions (Addendum)
Decrease Prozac to 20 mg daily for 2 weeks, then stop.  Increase Sertraline to 50 mg daily for one week, then increase Sertraline to 100 mg daily.   Take Alprazolam as needed for severe anxiety. Alprazolam can cause drowsiness, so recommend not driving after taking alprazolam until determining how it affects you.    Recommend follow-up in 4 weeks.  Recommend continuing leave of absence with tenative return to work date of 11/16/18.

## 2018-10-13 ENCOUNTER — Encounter: Payer: Self-pay | Admitting: Psychiatry

## 2018-10-19 ENCOUNTER — Telehealth: Payer: Self-pay | Admitting: Psychiatry

## 2018-10-19 DIAGNOSIS — Z0289 Encounter for other administrative examinations: Secondary | ICD-10-CM

## 2018-10-19 NOTE — Telephone Encounter (Signed)
PATIENT STATED HE NEEDS PAPERWORK FOR DISABILITY FAXED BY 10/22/2018 PATIENT NEEDS TO GET PAID BY THE 23RD.  PLEASE CALL PATIENT AT [phone]

## 2018-10-21 ENCOUNTER — Ambulatory Visit (INDEPENDENT_AMBULATORY_CARE_PROVIDER_SITE_OTHER): Payer: BLUE CROSS/BLUE SHIELD

## 2018-10-21 DIAGNOSIS — R002 Palpitations: Secondary | ICD-10-CM | POA: Diagnosis not present

## 2018-11-02 ENCOUNTER — Ambulatory Visit (INDEPENDENT_AMBULATORY_CARE_PROVIDER_SITE_OTHER): Payer: BLUE CROSS/BLUE SHIELD | Admitting: Psychiatry

## 2018-11-02 DIAGNOSIS — F410 Panic disorder [episodic paroxysmal anxiety] without agoraphobia: Secondary | ICD-10-CM
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
## 2018-11-10 ENCOUNTER — Ambulatory Visit (INDEPENDENT_AMBULATORY_CARE_PROVIDER_SITE_OTHER): Payer: BLUE CROSS/BLUE SHIELD | Admitting: Psychiatry

## 2018-11-10 DIAGNOSIS — F410 Panic disorder [episodic paroxysmal anxiety] without agoraphobia: Secondary | ICD-10-CM
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
## 2018-11-11 ENCOUNTER — Ambulatory Visit (INDEPENDENT_AMBULATORY_CARE_PROVIDER_SITE_OTHER): Payer: BLUE CROSS/BLUE SHIELD | Admitting: Psychiatry

## 2018-11-11 ENCOUNTER — Telehealth: Payer: Self-pay | Admitting: Family Medicine

## 2018-11-11 ENCOUNTER — Encounter: Payer: Self-pay | Admitting: Psychiatry

## 2018-11-11 ENCOUNTER — Ambulatory Visit: Payer: Self-pay | Admitting: Cardiology

## 2018-11-11 VITALS — BP 106/63 | HR 64

## 2018-11-11 DIAGNOSIS — I10 Essential (primary) hypertension: Secondary | ICD-10-CM

## 2018-11-11 DIAGNOSIS — F438 Other reactions to severe stress: Secondary | ICD-10-CM

## 2018-11-11 DIAGNOSIS — F329 Major depressive disorder, single episode, unspecified: Secondary | ICD-10-CM | POA: Diagnosis not present

## 2018-11-11 DIAGNOSIS — F411 Generalized anxiety disorder: Secondary | ICD-10-CM

## 2018-11-11 DIAGNOSIS — F32A Depression, unspecified: Secondary | ICD-10-CM

## 2018-11-11 DIAGNOSIS — F410 Panic disorder [episodic paroxysmal anxiety] without agoraphobia: Secondary | ICD-10-CM | POA: Diagnosis not present

## 2018-11-11 DIAGNOSIS — F4389 Other reactions to severe stress: Secondary | ICD-10-CM

## 2018-11-11 MED ORDER — SERTRALINE HCL 100 MG PO TABS: ORAL_TABLET | ORAL | 0 refills | Status: DC

## 2018-11-11 MED ORDER — PROPRANOLOL HCL 20 MG PO TABS: 20.0000 mg | ORAL_TABLET | Freq: Four times a day (QID) | ORAL | 1 refills | Status: DC

## 2018-11-11 NOTE — Addendum Note (Signed)
Addended by: Lanier Prude D on: 11/11/2018 05:05 PM   Modules accepted: Orders
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
## 2018-11-11 NOTE — Telephone Encounter (Signed)
Patient is requesting a refill of his propranolol, if approved please send to [facility] on [address].

## 2018-11-11 NOTE — Telephone Encounter (Signed)
Refill sent into the pharmacy. MPulliam, CMA/RT(R)  

## 2018-11-25 ENCOUNTER — Telehealth: Payer: Self-pay | Admitting: Psychiatry

## 2018-11-25 ENCOUNTER — Other Ambulatory Visit: Payer: Self-pay | Admitting: Psychiatry

## 2018-11-25 DIAGNOSIS — F410 Panic disorder [episodic paroxysmal anxiety] without agoraphobia: Secondary | ICD-10-CM

## 2018-11-25 DIAGNOSIS — F99 Mental disorder, not otherwise specified: Principal | ICD-10-CM

## 2018-11-25 DIAGNOSIS — F5105 Insomnia due to other mental disorder: Secondary | ICD-10-CM

## 2018-11-25 MED ORDER — TRAZODONE HCL 100 MG PO TABS: ORAL_TABLET | ORAL | 0 refills | Status: DC

## 2018-11-25 NOTE — Telephone Encounter (Signed)
Patient called and said that he needs something to help him sleep. The xanax you gave him only last 4 hours.he just  found out that his mom has stage 4 cancer. Please escribe to [facility] on [address].

## 2018-11-25 NOTE — Telephone Encounter (Signed)
Pt given information and actually has trazodone 50 mg at home, was prescribed that from prior physician but [facility] would not allow him to take. He's out of work currently so agreed to go ahead and try what he has at home, if works will pick up 100 mg.

## 2018-11-26 ENCOUNTER — Other Ambulatory Visit: Payer: Self-pay | Admitting: Psychiatry

## 2018-11-26 DIAGNOSIS — F410 Panic disorder [episodic paroxysmal anxiety] without agoraphobia: Secondary | ICD-10-CM

## 2018-12-02 ENCOUNTER — Ambulatory Visit (INDEPENDENT_AMBULATORY_CARE_PROVIDER_SITE_OTHER): Payer: BLUE CROSS/BLUE SHIELD | Admitting: Psychiatry

## 2018-12-02 DIAGNOSIS — F410 Panic disorder [episodic paroxysmal anxiety] without agoraphobia: Secondary | ICD-10-CM | POA: Diagnosis not present

## 2018-12-02 NOTE — Progress Notes (Signed)
      [facility] Counselor/Therapist Progress Note  Patient ID: Ralph Mack, MRN: [id number],    Date: 12/02/2018  Time Spent: 60 minutes  Treatment Type: Individual Therapy  Reported Symptoms:  Anxious, some panic, mom in [place],diagnosed (lung, breast, rib) Stage 4  Mental Status Exam:  Appearance:   Casual     Behavior:  Appropriate and Sharing  Motor:  Normal  Speech/Language:   a bit pressured when talking about anxiety/panic  Affect:  anxious  Mood:  anxious  Thought process:  normal  Thought content:    some obsessiveness in thoughts; racing thoughts  Sensory/Perceptual disturbances:    WNL  Orientation:  oriented to person, place, time/date, situation, day of week, month of year and year  Attention:  Fair  Concentration:  Fair  Memory:  getting better per patient  Fund of knowledge:   Good  Insight:    Fair  Judgment:   Good  Impulse Control:  Fair   Risk Assessment: Danger to Self:  No Self-injurious Behavior: No Danger to Others: No Duty to Warn:no Physical Aggression / Violence:No  Access to Firearms a concern: No  Gang Involvement:No   Subjective:  Patient in today and felt he had been doing better, interacting with people, less nervous and less panic, laughing with other people.  Came in today with his anxiety escalating after reporting his mom has been diagnosed with stage 4 cancer, and she lives in [place]. Has not been close to her but has called to inquire about her and they are to hear more on Friday. Anxious, he admits he looks for things to go wrong versus right, insistent that his "leave" be extended. Impatience heightened today and has low frustration tolerance per his report.  Due to his heightened anxiety and pressured speech today, most of session was helping him calm down and remain calm as we continued to speak.  He processed a lot of his history in relationship with mom as well as his regrets. Has concerns about missing a paycheck if paperwork is  not returned in time for his work.  Patient actually responded favorably to some grounding and remained calmer the rest of the session today.   Reviewed strategies to help his anxiety/panic and thoughts that lead to those feelings, particularly CBT and encouraged his use of diagram I gave him previously.  Also reminded to "stay in the present" more, rather than dwelling on past negatives or  his tendency to jump ahead and stress over the future. Was calmer and without pressured speech by session end and better understanding of how progress takes work and time.    Interventions: Cognitive Behavioral Therapy, Solution-Oriented/Positive Psychology and Ego-Supportive  Diagnosis:   ICD-10-CM   1. Panic disorder F41.0     Plan:   Patient to work with strategies discussed today and to remain on meds as prescribed.  To see med provider next week and return for therapy   Shanon Ace, LCSW

## 2018-12-06 NOTE — Progress Notes (Signed)
Cardiology Office Note   Date:  12/08/2018   ID:  Dagmawi Venable, DOB Jan 31, 1966, MRN [id number]  PCP:  [name], DO  Cardiologist:   No primary care provider on file. Referring:  [name], DO  Chief Complaint  Patient presents with  . Follow-up    Post monitor.       History of Present Illness: Bayan Kushnir is a 53 y.o. male who is referred by [name], DO for management of difficult to control hypertension and chest pain.  He has no past cardiac history other than a cardiac catheterization in 2009 in [place].  This was for chest discomfort.  He had minimal coronary plaque with 10% PDA stenosis.  There was also mild obtuse marginal stenosis.   His ejection fraction was NL.   He had a POET (Plain Old Exercise Treadmill) without ischemia in Jan 2020.    Since I last saw him he has been started on sertraline and had his Prozac discontinued.  He feels much better.  He is not describing any palpitations, presyncope or syncope.  He said no chest pressure, neck or arm discomfort.  He has no new shortness of breath, PND or orthopnea.  Of note he did wear a ZIO patch.  There was a questionable brief episode of atrial flutter although this could have been artifact.  I brought him back to discuss this.   Past Medical History:  Diagnosis Date  . Allergy   . Arthritis   . Hx of colonic polyp 11/13/2016   10/2016 7 mm ssp/a - recall 2023  . Hypothyroidism   . Prediabetes   . Recurrent upper respiratory infection (URI)     Past Surgical History:  Procedure Laterality Date  . CORONARY ANGIOGRAM  2009  . NO PAST SURGERIES       Current Outpatient Medications  Medication Sig Dispense Refill  . allopurinol (ZYLOPRIM) 300 MG tablet TAKE ONE TABLET BY MOUTH ONCE DAILY 90 tablet 3  . ALPRAZolam (XANAX) 0.5 MG tablet TAKE 1/2 TO 1 (ONE-HALF TO ONE) TABLET BY MOUTH TWICE DAILY AS NEEDED FOR ANXIETY 30 tablet 0  . amLODipine (NORVASC) 5 MG tablet Take 1 tablet (5 mg total)  by mouth daily. 90 tablet 1  . Cholecalciferol (VITAMIN D3) 5000 units TABS 5,000 IU OTC vitamin D3 daily. 90 tablet 3  . Coenzyme Q10 (CO Q 10) 100 MG CAPS Take 3 capsules by mouth daily.    . fexofenadine (ALLEGRA) 180 MG tablet Take 180 mg by mouth daily.    . fluticasone (FLONASE) 50 MCG/ACT nasal spray Place 2 sprays into the nose daily.    . hydrochlorothiazide (MICROZIDE) 12.5 MG capsule Take 1 capsule (12.5 mg total) by mouth daily. 90 capsule 1  . levothyroxine (SYNTHROID, LEVOTHROID) 50 MCG tablet TAKE 1 TABLET BY MOUTH ONCE DAILY BEFORE BREAKFAST 90 tablet 0  . losartan (COZAAR) 50 MG tablet Take 1 tablet (50 mg total) by mouth daily. 90 tablet 1  . Multiple Vitamins-Minerals (SUPER MEGA VITE 75/BETA CARO PO) Take by mouth.    . Omega-3 Fatty Acids (FISH OIL) 1200 MG CAPS 2 p.o. twice daily    . Potassium 99 MG TABS Take 3-4 tablets by mouth daily.    . propranolol (INDERAL) 20 MG tablet Take 1 tablet (20 mg total) by mouth 4 (four) times daily. 120 tablet 1  . sertraline (ZOLOFT) 100 MG tablet Take 1.5 tabs po qd x 1 week, then increase to 2 tabs po qd  180 tablet 0  . traZODone (DESYREL) 100 MG tablet Take 1/2-1 tablet po QHS prn insomnia 30 tablet 0  . Zinc 50 MG CAPS Take 1 capsule by mouth daily.     Current Facility-Administered Medications  Medication Dose Route Frequency Provider Last Rate Last Dose  . 0.9 %  sodium chloride infusion  500 mL Intravenous Continuous Gatha Mayer, MD        Allergies:   Patient has no known allergies.    ROS:  Please see the history of present illness.   Otherwise, review of systems are positive for none.   All other systems are reviewed and negative.    PHYSICAL EXAM: VS:  BP 110/70 (BP Location: Left Arm, Patient Position: Sitting, Cuff Size: Large)   Pulse 76   Ht 5\' 7"  (1.702 m)   Wt 236 lb (107 kg)   BMI 36.96 kg/m  , BMI Body mass index is 36.96 kg/m. GENERAL:  Well appearing NECK:  No jugular venous distention, waveform  within normal limits, carotid upstroke brisk and symmetric, no bruits, no thyromegaly LUNGS:  Clear to auscultation bilaterally CHEST:  Unremarkable HEART:  PMI not displaced or sustained,S1 and S2 within normal limits, no S3, no S4, no clicks, no rubs, no murmurs ABD:  Flat, positive bowel sounds normal in frequency in pitch, no bruits, no rebound, no guarding, no midline pulsatile mass, no hepatomegaly, no splenomegaly EXT:  2 plus pulses throughout, no edema, no cyanosis no clubbing    EKG:  EKG is not ordered today.   Recent Labs: 08/17/2018: ALT 24; BUN 12; Creatinine, Ser 0.71; Hemoglobin 15.8; Platelets 188; Potassium 4.6; Sodium 136; TSH 1.760    Lipid Panel    Component Value Date/Time   CHOL 220 (H) 08/17/2018 1152   TRIG 91 08/17/2018 1152   HDL 70 08/17/2018 1152   CHOLHDL 3.1 08/17/2018 1152   LDLCALC 132 (H) 08/17/2018 1152      Wt Readings from Last 3 Encounters:  12/08/18 236 lb (107 kg)  10/12/18 234 lb 6.4 oz (106.3 kg)  10/05/18 231 lb 3.2 oz (104.9 kg)      Other studies Reviewed: Additional studies/ records that were reviewed today include:   ZIO patch Review of the above records demonstrates:  See above   ASSESSMENT AND PLAN:  HTN: Blood pressures well controlled.  No change in therapy.  CHEST PAIN: He had a negative Holter.  No change in therapy.  PALPITATIONS:    There was questionable flutter although this may well have been artifact.  Mr. Earon Rivest has a CHA2DS2 - VASc score of 1.   At this point he does not need a change in therapy.  I will probably follow-up with a repeat event monitor in 1 year.  Let me know if he has any increasing symptoms.  Current medicines are reviewed at length with the patient today.  The patient  concerns regarding medicines.  The following changes have been made:  None  Labs/ tests ordered today include:   None  No orders of the defined types were placed in this encounter.    Disposition:   FU with me  one year.      Signed, Minus Breeding, MD  12/08/2018 1:35 PM    [place]

## 2018-12-08 ENCOUNTER — Encounter: Payer: Self-pay | Admitting: Cardiology

## 2018-12-08 ENCOUNTER — Ambulatory Visit: Payer: BLUE CROSS/BLUE SHIELD | Admitting: Cardiology

## 2018-12-08 VITALS — BP 110/70 | HR 76 | Ht 67.0 in | Wt 236.0 lb

## 2018-12-08 DIAGNOSIS — R079 Chest pain, unspecified: Secondary | ICD-10-CM | POA: Diagnosis not present

## 2018-12-08 DIAGNOSIS — I10 Essential (primary) hypertension: Secondary | ICD-10-CM | POA: Diagnosis not present

## 2018-12-08 DIAGNOSIS — R002 Palpitations: Secondary | ICD-10-CM | POA: Diagnosis not present

## 2018-12-08 NOTE — Patient Instructions (Signed)
Medication Instructions:  Continue current medications  If you need a refill on your cardiac medications before your next appointment, please call your pharmacy.  Labwork: None Ordered   Testing/Procedures: None Ordered  Follow-Up: You will need a follow up appointment in 1 Year.  Please call our office 2 months in advance to schedule this appointment.  You may see Dr Hochrein or one of the following Advanced Practice Providers on your designated Care Team:   Rhonda Barrett, PA-C . Kathryn Lawrence, DNP, ANP   At [facility], you and your health needs are our priority.  As part of our continuing mission to provide you with exceptional heart care, we have created designated Provider Care Teams.  These Care Teams include your primary Cardiologist (physician) and Advanced Practice Providers (APPs -  Physician Assistants and Nurse Practitioners) who all work together to provide you with the care you need, when you need it.  Thank you for choosing [facility] at [facility]!!     

## 2018-12-10 ENCOUNTER — Ambulatory Visit: Payer: BLUE CROSS/BLUE SHIELD | Admitting: Psychiatry

## 2018-12-11 ENCOUNTER — Other Ambulatory Visit: Payer: Self-pay

## 2018-12-11 ENCOUNTER — Encounter: Payer: Self-pay | Admitting: Psychiatry

## 2018-12-11 ENCOUNTER — Ambulatory Visit (INDEPENDENT_AMBULATORY_CARE_PROVIDER_SITE_OTHER): Payer: BLUE CROSS/BLUE SHIELD | Admitting: Psychiatry

## 2018-12-11 VITALS — Wt 230.0 lb

## 2018-12-11 DIAGNOSIS — F99 Mental disorder, not otherwise specified: Secondary | ICD-10-CM

## 2018-12-11 DIAGNOSIS — F410 Panic disorder [episodic paroxysmal anxiety] without agoraphobia: Secondary | ICD-10-CM | POA: Diagnosis not present

## 2018-12-11 DIAGNOSIS — F329 Major depressive disorder, single episode, unspecified: Secondary | ICD-10-CM

## 2018-12-11 DIAGNOSIS — F5105 Insomnia due to other mental disorder: Secondary | ICD-10-CM | POA: Diagnosis not present

## 2018-12-11 DIAGNOSIS — F411 Generalized anxiety disorder: Secondary | ICD-10-CM | POA: Diagnosis not present

## 2018-12-11 DIAGNOSIS — F32A Depression, unspecified: Secondary | ICD-10-CM

## 2018-12-11 MED ORDER — TRAZODONE HCL 100 MG PO TABS: ORAL_TABLET | ORAL | 1 refills | Status: DC

## 2018-12-11 MED ORDER — ALPRAZOLAM 0.5 MG PO TABS: ORAL_TABLET | ORAL | 0 refills | Status: DC

## 2018-12-11 NOTE — Progress Notes (Signed)
Azarion Hove [id number] 30-Jul-1966 53 y.o.  Subjective:   Patient ID:  Ralph Mack is a 53 y.o. (DOB October 11, 1965) male.  Chief Complaint:  Chief Complaint  Patient presents with  . Anxiety  . Depression  . Insomnia    HPI Ralph Mack presents to the office today for follow-up of anxiety and depression. Reports some "nervousstomach and nervous excitement... diarrhea" with anxiety. He reports that he has also been vomiting due to stress. He reports "I'm angry because I was ready to go back to work." Reports that his anxiety had improved and he was smoking cigarettes. Denies any recent panic attacks. Reports multiple worries. Denies depressed mood. He reports adequate sleep with Trazodone. Sleeping 8-10 hours a night. Reports increased appetite since coming off of Prozac. Energy and motivation have been good. Denies difficulty with concentration and reports that he is noticing things he would not normally notice. Enjoying cooking again. Denies SI.   Mother has been hospitalized and dx'd with Stage IV cancer and learned this week that her prognosis is poor since she has bone mets. Reports several family stressors and that mother has strained relationships with several family members. He is an only child and that he will be mother's Power of Attorney. He reports that he has been told he needs to go to [place] where his mother is hospitalized to help make health care decisions and arrangements.    Past Psychiatric Medication Trials: Prozac-ineffective Sertraline Alprazolam- effective Propranolol-ineffective  Review of Systems:  Review of Systems  Gastrointestinal: Positive for diarrhea, nausea and vomiting.  Musculoskeletal: Negative for gait problem.  Neurological: Negative for tremors.  Psychiatric/Behavioral:       Please refer to HPI    Medications: I have reviewed the patient's current medications.  Current Outpatient Medications  Medication Sig Dispense Refill  . allopurinol  (ZYLOPRIM) 300 MG tablet TAKE ONE TABLET BY MOUTH ONCE DAILY 90 tablet 3  . ALPRAZolam (XANAX) 0.5 MG tablet TAKE 1/2 TO 1 (ONE-HALF TO ONE) TABLET BY MOUTH TWICE DAILY AS NEEDED FOR ANXIETY 30 tablet 0  . amLODipine (NORVASC) 5 MG tablet Take 1 tablet (5 mg total) by mouth daily. 90 tablet 1  . Cholecalciferol (VITAMIN D3) 5000 units TABS 5,000 IU OTC vitamin D3 daily. 90 tablet 3  . Coenzyme Q10 (CO Q 10) 100 MG CAPS Take 3 capsules by mouth daily.    . fexofenadine (ALLEGRA) 180 MG tablet Take 180 mg by mouth daily.    . fluticasone (FLONASE) 50 MCG/ACT nasal spray Place 2 sprays into the nose daily.    . hydrochlorothiazide (MICROZIDE) 12.5 MG capsule Take 1 capsule (12.5 mg total) by mouth daily. 90 capsule 1  . levothyroxine (SYNTHROID, LEVOTHROID) 50 MCG tablet TAKE 1 TABLET BY MOUTH ONCE DAILY BEFORE BREAKFAST 90 tablet 0  . losartan (COZAAR) 50 MG tablet Take 1 tablet (50 mg total) by mouth daily. 90 tablet 1  . Multiple Vitamins-Minerals (SUPER MEGA VITE 75/BETA CARO PO) Take by mouth.    . Omega-3 Fatty Acids (FISH OIL) 1200 MG CAPS 2 p.o. twice daily    . Potassium 99 MG TABS Take 3-4 tablets by mouth daily.    . propranolol (INDERAL) 20 MG tablet Take 1 tablet (20 mg total) by mouth 4 (four) times daily. 120 tablet 1  . sertraline (ZOLOFT) 100 MG tablet Take 1.5 tabs po qd x 1 week, then increase to 2 tabs po qd 180 tablet 0  . traZODone (DESYREL) 100 MG tablet Take 1/2-1 tablet  po QHS prn insomnia 30 tablet 1  . Zinc 50 MG CAPS Take 1 capsule by mouth daily.     Current Facility-Administered Medications  Medication Dose Route Frequency Provider Last Rate Last Dose  . 0.9 %  sodium chloride infusion  500 mL Intravenous Continuous Gatha Mayer, MD        Medication Side Effects: None  Allergies: No Known Allergies  Past Medical History:  Diagnosis Date  . Allergy   . Arthritis   . Hx of colonic polyp 11/13/2016   10/2016 7 mm ssp/a - recall 2023  . Hypothyroidism   .  Prediabetes   . Recurrent upper respiratory infection (URI)     Family History  Problem Relation Age of Onset  . Asthma Mother   . COPD Mother   . Cancer Mother        Skin  . Heart disease Father        Pacemaker  . Parkinson's disease Father   . Depression Father   . Depression Daughter   . OCD Daughter   . Alcohol abuse Maternal Grandmother   . Schizophrenia Maternal Grandmother   . Diabetes Paternal Grandfather   . Kidney disease Paternal Grandfather   . Colon polyps Neg Hx   . Colon cancer Neg Hx   . Esophageal cancer Neg Hx   . Stomach cancer Neg Hx   . Rectal cancer Neg Hx     Social History   Socioeconomic History  . Marital status: Married    Spouse name: Not on file  . Number of children: Not on file  . Years of education: Not on file  . Highest education level: Not on file  Occupational History  . Not on file  Social Needs  . Financial resource strain: Not on file  . Food insecurity:    Worry: Not on file    Inability: Not on file  . Transportation needs:    Medical: Not on file    Non-medical: Not on file  Tobacco Use  . Smoking status: Light Tobacco Smoker    Types: Cigarettes  . Smokeless tobacco: Never Used  Substance and Sexual Activity  . Alcohol use: Yes    Alcohol/week: 12.0 standard drinks    Types: 12 Cans of beer per week  . Drug use: No  . Sexual activity: Not on file  Lifestyle  . Physical activity:    Days per week: Not on file    Minutes per session: Not on file  . Stress: Not on file  Relationships  . Social connections:    Talks on phone: Not on file    Gets together: Not on file    Attends religious service: Not on file    Active member of club or organization: Not on file    Attends meetings of clubs or organizations: Not on file    Relationship status: Not on file  . Intimate partner violence:    Fear of current or ex partner: Not on file    Emotionally abused: Not on file    Physically abused: Not on file     Forced sexual activity: Not on file  Other Topics Concern  . 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      Past Medical History, Surgical history, Social history, and Family history were reviewed and updated as appropriate.   Please see review of systems for further details on the patient's review from  today.   Objective:   Physical Exam:  Wt 230 lb (104.3 kg)   BMI 36.02 kg/m   Physical Exam Constitutional:      General: He is not in acute distress.    Appearance: He is well-developed.  Musculoskeletal:        General: No deformity.  Neurological:     Mental Status: He is alert and oriented to person, place, and time.     Coordination: Coordination normal.  Psychiatric:        Attention and Perception: Perception normal. He is inattentive. He does not perceive auditory or visual hallucinations.        Mood and Affect: Mood is anxious. Mood is not depressed. Affect is not labile, blunt, angry or inappropriate.        Speech: Speech is rapid and pressured.        Behavior: Behavior normal.        Thought Content: Thought content normal. Thought content does not include homicidal or suicidal ideation. Thought content does not include homicidal or suicidal plan.        Cognition and Memory: Cognition and memory normal.        Judgment: Judgment normal.     Comments: Insight intact. No delusions.  Patient missed scheduled appointment yesterday since he thought that his appointment was at 4:45 PM after hearing reminder [id number]  Patient noted to be tangential on exam and needing frequent redirection to return to answering questions related to ROS.     Lab Review:     Component Value Date/Time   NA 136 08/17/2018 1152   K 4.6 08/17/2018 1152   CL 92 (L) 08/17/2018 1152   CO2 24 08/17/2018 1152   GLUCOSE 107 (H) 08/17/2018 1152   BUN 12 08/17/2018 1152   CREATININE 0.71 (L) 08/17/2018 1152   CALCIUM 9.3  08/17/2018 1152   PROT 6.9 08/17/2018 1152   ALBUMIN 4.6 08/17/2018 1152   AST 17 08/17/2018 1152   ALT 24 08/17/2018 1152   ALKPHOS 58 08/17/2018 1152   BILITOT 0.5 08/17/2018 1152   GFRNONAA 106 08/17/2018 1152   GFRAA 123 08/17/2018 1152       Component Value Date/Time   WBC 8.3 08/17/2018 1152   RBC 4.85 08/17/2018 1152   HGB 15.8 08/17/2018 1152   HCT 46.7 08/17/2018 1152   PLT 188 08/17/2018 1152   MCV 96 08/17/2018 1152   MCH 32.6 08/17/2018 1152   MCHC 33.8 08/17/2018 1152   RDW 12.7 08/17/2018 1152   LYMPHSABS 1.7 08/17/2018 1152   EOSABS 0.2 08/17/2018 1152   BASOSABS 0.1 08/17/2018 1152    No results found for: POCLITH, LITHIUM   No results found for: PHENYTOIN, PHENOBARB, VALPROATE, CBMZ   .res Assessment: Plan:   Will continue sertraline 200 mg daily since this has been beneficial for patient's anxiety and panic signs and symptoms.  Patient reports that medication had been adequately controlling mood and anxiety signs and symptoms prior to learning that his mother was diagnosed with terminal cancer. Will continue Xanax as needed for anxiety. Continue trazodone 100 mg 1/2-1 tab p.o. nightly as needed insomnia. Recommend continuing to see Rinaldo Cloud, LCSW for therapy. Patient advised to contact office with any questions, adverse effects, or acute worsening in signs and symptoms. Recommend extending leave of absence through 01/07/19 with tentative return to work date of 01/08/19.   Panic disorder - Plan: ALPRAZolam (XANAX) 0.5 MG tablet  Generalized anxiety disorder  Depression, unspecified depression type  Insomnia due to other mental disorder - Plan: traZODone (DESYREL) 100 MG tablet   Recommend extending leave of absence through 01/07/19 with tentative return to work date of 01/08/19.   Please see After Visit Summary for patient specific instructions.  Future Appointments  Date Time Provider [place]  12/23/2018 12:00 PM Shanon Ace, LCSW [facility]  None  01/06/2019 10:45 AM Opalski, Neoma Laming, DO [facility] None    No orders of the defined types were placed in this encounter.     -------------------------------

## 2018-12-22 ENCOUNTER — Telehealth: Payer: Self-pay | Admitting: Psychiatry

## 2018-12-22 NOTE — Telephone Encounter (Signed)
Patient wants to know if paperwork is complete for [facility] patient needs a copy of forms emailed or can pick them up

## 2018-12-23 ENCOUNTER — Ambulatory Visit (INDEPENDENT_AMBULATORY_CARE_PROVIDER_SITE_OTHER): Payer: BLUE CROSS/BLUE SHIELD | Admitting: Psychiatry

## 2018-12-23 ENCOUNTER — Other Ambulatory Visit: Payer: Self-pay

## 2018-12-23 DIAGNOSIS — F410 Panic disorder [episodic paroxysmal anxiety] without agoraphobia: Secondary | ICD-10-CM

## 2018-12-23 NOTE — Progress Notes (Signed)
[facility] Counselor/Therapist Progress Note  Patient ID: Ralph Mack, MRN: [id number],    Date: 12/23/2018  Time Spent: 58 minutes  Treatment Type: Individual Therapy   Virtual Visit via Telephone Note I connected with patient by a video enabled telemedicine application or telephone, with their informed consent, and verified patient privacy and that I am speaking with the correct person using two identifiers. I was located at [place] and patient was at home.   I discussed the limitations, risks, security and privacy concerns of performing psychotherapy and management service by telephone and the availability of in person appointments. I also discussed with the patient that there may be a patient responsible charge related to this service. The patient expressed understanding and agreed to proceed.  I discussed the treatment planning with the patient. The patient was provided an opportunity to ask questions and all were answered. The patient agreed with the plan and demonstrated an understanding of the instructions.   The patient was advised to call  our office if  symptoms worsen or feel they are in a crisis state and need immediate contact.   Reported Symptoms:  Anxious (improved), panic "seems to have gone",  mom in [place] died recently.       Mental Status Exam:  Appearance:   Casual     Behavior:  Appropriate and Sharing  Motor:  Normal  Speech/Language:   a bit pressured when talking about anxiety/panic  Affect:  anxious  Mood:  anxious  Thought process:  normal  Thought content:    some obsessiveness in thoughts; racing thoughts  Sensory/Perceptual disturbances:    WNL  Orientation:  oriented to person, place, time/date, situation, day of week, month of year and year  Attention:  Fair  Concentration:  Fair  Memory:  getting better per patient  Fund of knowledge:   Good  Insight:    Fair  Judgment:   Good  Impulse Control:  Fair   Risk  Assessment: Danger to Self:  No Self-injurious Behavior: No Danger to Others: No Duty to Warn:no Physical Aggression / Violence:No  Access to Firearms a concern: No  Gang Involvement:No   Subjective:  Patient today reports decreased anxiety, no panic episodes since last appt.  Denies depression and denies any SI or HI.  Wife has told him that she feels he is better also.  Mom did die last week.  They were not close and no funeral is planned.  He reports mixed feelings but also feels he is thinking things through more rationally and has started to move forward with his life.  Reports managing his anxiety and stress much better, no temper outbursts, and feeling more connected and supported within the family.  Less nervous, sounds calm with no pressured speech, and actually doing more things he previously enjoyed including cooking for family at home. Looking more for what may go right rather than wrong and becoming more accepting of current reality and trying to develop a more positive mindset.  States he is less frustrated and less impatient.  Feels he is more and more ready to return to work and is speaking with his med provider about that issue.  Would like to return January 08, 2019.  Reviewed strategies for management of anxiety/panic that may arise again, particularly CBT.  Interventions: Cognitive Behavioral Therapy, Solution-Oriented/Positive Psychology and Ego-Supportive  Diagnosis:   ICD-10-CM   1. Panic disorder F41.0     Plan:   Patient to work with strategies  discussed today and to remain on meds as prescribed.  To see med provider next week and will have another session within 2 wks before any possible return to work.   Shanon Ace, LCSW

## 2019-01-01 ENCOUNTER — Ambulatory Visit (INDEPENDENT_AMBULATORY_CARE_PROVIDER_SITE_OTHER): Payer: BLUE CROSS/BLUE SHIELD | Admitting: Psychiatry

## 2019-01-01 DIAGNOSIS — F410 Panic disorder [episodic paroxysmal anxiety] without agoraphobia: Secondary | ICD-10-CM

## 2019-01-01 NOTE — Progress Notes (Signed)
[facility] Counselor/Therapist Progress Note  Patient ID: Ralph Mack, MRN: [id number],    Date: 01/01/2019  Time Spent: 45 minutes   12:15pm to 1:00pm  Treatment Type: Individual Therapy    Virtual Visit via Telephone Note I connected with patient by a video enabled telemedicine application or telephone, with their informed consent, and verified patient privacy and that I am speaking with the correct person using two identifiers. I was located at [place] and patient was at home.   I discussed the limitations, risks, security and privacy concerns of performing psychotherapy and management service by telephone and the availability of in person appointments. I also discussed with the patient that there may be a patient responsible charge related to this service. The patient expressed understanding and agreed to proceed.  I discussed the treatment planning with the patient. The patient was provided an opportunity to ask questions and all were answered. The patient agreed with the plan and demonstrated an understanding of the instructions.   The patient was advised to call  our office if  symptoms worsen or feel they are in a crisis state and need immediate contact.   Reported Symptoms:  Anxiety much improved, denies any panic attacks in "almost past 2 months"       Mental Status Exam:  Appearance:     n/a  Behavior:  Sharing  Motor:  Normal  Speech/Language:   Clear and coherent  Affect:  n/a  Mood:  anxious  Thought process:  normal  Thought content:    normal  Sensory/Perceptual disturbances:    WNL  Orientation:  oriented to person, place, time/date, situation, day of week, month of year and year  Attention:  Fair  Concentration:  Fair  Memory:  getting better per patient  Fund of knowledge:   Good  Insight:    Good  Judgment:   Good  Impulse Control:  Seems good hearing patient process his current situation and improvement made   Risk  Assessment: Danger to Self:  No Self-injurious Behavior: No Danger to Others: No Duty to Warn:no Physical Aggression / Violence:No  Access to Firearms a concern: No  Gang Involvement:No   Subjective:  Patient today reports no panic attacks in "almost 2 months", anxiety significantly decreased, "the coronavirus is concerning but not overwhelming".  Switching meds to Zoloft has really helped. "I feel I'm better,am sleeping good, eating well, more energy, exercising, feel 95% calmer, and feel ready to return to work and do my job well."  Patient today reports decreased anxiety, no panic episodes in past several wks. Denies depression and denies any SI or HI.  Wife has told him that she feels he is better also.  He reports mixed feelings about mom's recent death but also feels he is thinking things through more rationally and has started to move forward with his life.  No temper outbursts, and feeling more connected and supported within the family.  Less nervous, sounds calm with no pressured speech and actually doing more things he previously enjoyed including cooking for family at home. More positive and wants to hold on to that.  Eating healthier and has lost some weight which he is proud of.  States he is less frustrated and less impatient.  Feels he i ready to return to work and is speaking with his med provider about that issue.  Would like to return January 08, 2019.  Reviewed strategies, especially CBT,  for stress management as he prepares to  return to work.  Interventions: Cognitive Behavioral Therapy, Solution-Oriented/Positive Psychology and Ego-Supportive  Diagnosis:   ICD-10-CM   1. Panic disorder F41.0     Plan:   Patient to continue to work with stress management strategies as he returns to work.  Will find out his schedule and will call office for appt with therapist and with med provider as he remains on Zoloft.    Shanon Ace, LCSW

## 2019-01-05 ENCOUNTER — Ambulatory Visit: Payer: BLUE CROSS/BLUE SHIELD | Admitting: Psychiatry

## 2019-01-06 ENCOUNTER — Other Ambulatory Visit: Payer: Self-pay

## 2019-01-06 ENCOUNTER — Ambulatory Visit (INDEPENDENT_AMBULATORY_CARE_PROVIDER_SITE_OTHER): Payer: BLUE CROSS/BLUE SHIELD | Admitting: Family Medicine

## 2019-01-06 ENCOUNTER — Encounter: Payer: Self-pay | Admitting: Family Medicine

## 2019-01-06 VITALS — BP 124/80 | HR 60 | Ht 67.0 in | Wt 230.0 lb

## 2019-01-06 DIAGNOSIS — E785 Hyperlipidemia, unspecified: Secondary | ICD-10-CM | POA: Insufficient documentation

## 2019-01-06 DIAGNOSIS — F39 Unspecified mood [affective] disorder: Secondary | ICD-10-CM

## 2019-01-06 DIAGNOSIS — I10 Essential (primary) hypertension: Secondary | ICD-10-CM | POA: Diagnosis not present

## 2019-01-06 DIAGNOSIS — F438 Other reactions to severe stress: Secondary | ICD-10-CM | POA: Diagnosis not present

## 2019-01-06 DIAGNOSIS — F4389 Other reactions to severe stress: Secondary | ICD-10-CM

## 2019-01-06 NOTE — Progress Notes (Signed)
Virtual Visit via Telephone Note for [employer], D.O- Primary Care Physician at [facility]   I connected with current patient today by telephone and verified that I am speaking with the correct person using two identifiers.   Because of federal recommendations of social distancing due to the current novel COVID-19 outbreak, an audio/video telehealth visit is felt to be most appropriate for this patient at this time.  My staff members also discussed with the patient that there may be a patient charge related to this service.   The patient expressed understanding, and agreed to proceed.    History of Present Illness:    Lost mom c wks ago.      Mood- managed by psychiatry,   Doing really well.  They tell him that he can RTW this Sat.  Dessie Coma- counselor- seeing her every c of weeks now   BP-  Going to cards- Dr Gean Quint- 224 now-   10/05/2018 pt was 231.   He has tried to eat better.      Wt Readings from Last 3 Encounters:  01/06/19 230 lb (104.3 kg)  12/11/18 230 lb (104.3 kg)  12/08/18 236 lb (107 kg)    BP Readings from Last 3 Encounters:  01/06/19 124/80  12/08/18 110/70  11/11/18 106/63    Pulse Readings from Last 3 Encounters:  01/06/19 60  12/08/18 76  11/11/18 64    BMI Readings from Last 3 Encounters:  01/06/19 36.02 kg/m  12/11/18 36.02 kg/m  12/08/18 36.96 kg/m     -Vitals obtained; Medications, allergies reconciled;  personal medical, social, Sx etc. etc. histories were updated by Lanier Prude the medical assistant today and are reflected in below chart    Patient Care Team    Relationship Specialty Notifications Start End  [name], DO PCP - General Family Medicine  09/26/16   Melida Quitter, MD Consulting Physician Otolaryngology  09/26/16   Kennith Gain, MD Consulting Physician Allergy  09/26/16    Comment: [address] [place].   Gatha Mayer, MD Consulting Physician Gastroenterology  11/21/16       Patient Active Problem List   Diagnosis Date Noted  . Hypothyroidism 10/24/2016    Priority: High  . OAB (overactive bladder) 10/24/2016    Priority: High  . Prediabetes 09/05/2015    Priority: High  . Hypertriglyceridemia 09/05/2015    Priority: High  . Obesity, Class II, BMI 35-39.9, isolated 07/26/2015    Priority: High  . Smoker 07/26/2015    Priority: High  . Fatigue 10/24/2016    Priority: Medium  . Alcohol use 09/26/2016    Priority: Medium  . History of substance abuse ([place]) 09/26/2016    Priority: Medium  . Gout 07/26/2015    Priority: Medium  . Urinary frequency 10/24/2016    Priority: Low  . Low libido 09/26/2016    Priority: Low  . Environmental and seasonal allergies 09/26/2016    Priority: Low  . Family hx of colon cancer requiring screening colonoscopy 09/26/2016    Priority: Low  . Vitamin D deficiency 09/05/2015    Priority: Low  . Hyperlipidemia 01/06/2019  . Mood disorder ([place]) 10/05/2018  . Dyslipidemia 09/11/2018  . Essential hypertension 09/11/2018  . Chest pain 09/11/2018  . HTN (hypertension), malignant 09/02/2018  . Stress-related physiological response affecting physical condition 12-Sep-2018  . Death of family member September 12, 2018  . Sleeping difficulty 08/17/2018  . Acute reaction to situational stress 08/17/2018  .  Irritability and anger 08/17/2018  . Low testosterone in male 08/17/2018  . Elevated blood pressure reading- w/o dx HTN 10/14/2017  . White coat syndrome without diagnosis of hypertension 10/14/2017  . Hx of colonic polyp 11/13/2016  . Hematochezia 09/26/2016  . Screening for colon cancer 09/26/2016  . Recurrent sinusitis 07/26/2015  . Cervical disc disease 07/26/2015     Current Meds  Medication Sig  . allopurinol (ZYLOPRIM) 300 MG tablet TAKE ONE TABLET BY MOUTH ONCE DAILY  . ALPRAZolam (XANAX) 0.5 MG tablet TAKE 1/2 TO 1 (ONE-HALF TO ONE) TABLET BY MOUTH TWICE DAILY AS NEEDED FOR ANXIETY  . amLODipine (NORVASC) 5  MG tablet Take 1 tablet (5 mg total) by mouth daily.  . Cholecalciferol (VITAMIN D3) 5000 units TABS 5,000 IU OTC vitamin D3 daily.  . Coenzyme Q10 (CO Q 10) 100 MG CAPS Take 3 capsules by mouth daily.  . fexofenadine (ALLEGRA) 180 MG tablet Take 180 mg by mouth daily.  . fluticasone (FLONASE) 50 MCG/ACT nasal spray Place 2 sprays into the nose daily.  . hydrochlorothiazide (MICROZIDE) 12.5 MG capsule Take 1 capsule (12.5 mg total) by mouth daily.  Marland Kitchen levothyroxine (SYNTHROID, LEVOTHROID) 50 MCG tablet TAKE 1 TABLET BY MOUTH ONCE DAILY BEFORE BREAKFAST  . losartan (COZAAR) 50 MG tablet Take 1 tablet (50 mg total) by mouth daily.  . Multiple Vitamins-Minerals (SUPER MEGA VITE 75/BETA CARO PO) Take by mouth.  . Omega-3 Fatty Acids (FISH OIL) 1200 MG CAPS 2 p.o. twice daily  . Potassium 99 MG TABS Take 3-4 tablets by mouth daily.  . propranolol (INDERAL) 20 MG tablet Take 1 tablet (20 mg total) by mouth 4 (four) times daily.  . sertraline (ZOLOFT) 100 MG tablet Take 1.5 tabs po qd x 1 week, then increase to 2 tabs po qd  . traZODone (DESYREL) 100 MG tablet Take 1/2-1 tablet po QHS prn insomnia  . Zinc 50 MG CAPS Take 1 capsule by mouth daily.   Current Facility-Administered Medications for the 01/06/19 encounter (Office Visit) with [name], DO  Medication  . 0.9 %  sodium chloride infusion     Allergies:  No Known Allergies   ROS:  See above HPI for pertinent positives and negatives   Objective:   Blood pressure 124/80, pulse 60, height 5\' 7"  (1.702 m), weight 230 lb (104.3 kg). (if some vitals are omitted, this means that patient was UNABLE to obtain them even though asked to get them prior to OV today) General: sounds in no acute distress.  Skin: Pt confirms warm and dry  extremities and pink fingertips Respiratory: speaking in full sentences, no conversational dyspnea Psych: A and O *3, appears insight good, mood- full      Impression and Recommendations:       ICD-10-CM   1. Stress-related physiological response affecting physical condition F43.8   2. HTN (hypertension), malignant I10   3. Mood disorder ([place]) F39   4. Hyperlipidemia, unspecified hyperlipidemia type E78.5 Lipid panel      As part of my medical decision making, I reviewed the following data within the [place] History obtained from pt/family, CMA notes reviewed and incorporated, Labs reviewed, Radiograph/ tests reviewed if applicable and OV notes from prior OV's with me, as well as other specialists he has seen since seeing me last, were all reviewed and used in my medical decision making process today. Additionally, discussion had with patient regarding txmnt plan, their biases about that plan etc were used in my  medical decision making today.  I discussed the assessment and treatment plan with the patient. The patient was provided an opportunity to ask questions and all were answered.  The patient agreed with the plan and demonstrated an understanding of the instructions.   No barriers to understanding were identified.  Red flag symptoms and signs discussed in detail.  Patient expressed understanding regarding what to do in case of emergency\urgent symptoms   The patient was advised to call back or seek an in-person evaluation if the symptoms worsen or if the condition fails to improve as anticipated.   Return for 3-4 mo for OV- HLD, BP etc; FLP prior to appt.    Orders Placed This Encounter  Procedures  . Lipid panel    No orders of the defined types were placed in this encounter.   There are no discontinued medications.    **Gross side effects, risk and benefits, and alternatives of medications and treatment plan in general discussed with patient.  Patient is aware that all medications have potential side effects and we are unable to predict every side effect or drug-drug interaction that may occur.   Patient was strongly encouraged to call with any  questions or concerns they may have concerns.     I provided 23+ minutes of  non-face-to-face time during this encounter.   [name], DO

## 2019-01-12 ENCOUNTER — Telehealth: Payer: Self-pay

## 2019-01-12 NOTE — Telephone Encounter (Signed)
I have paperwork to complete for a CMV Driver Medication Form for [employer]. It states that we need to attest that he is no longer taking Alprazolam and the Trazodone. I looked through your notes and I do not see where Alprazolam or Trazodone  has been DC. If he is no longer taking these I can fill out the form and have Dr. Clovis Pu sign it. Let me know. Thank you

## 2019-01-13 NOTE — Addendum Note (Signed)
Addended by: Sharyl Nimrod on: 01/13/2019 12:12 PM   Modules accepted: Orders

## 2019-01-13 NOTE — Telephone Encounter (Signed)
I spoke with him and he stated that he has stopped taking both medications. He would like me to wait until Monday to fax them so he does not have to go back in the middle of the week.

## 2019-02-02 ENCOUNTER — Other Ambulatory Visit: Payer: Self-pay | Admitting: Psychiatry

## 2019-02-02 DIAGNOSIS — F411 Generalized anxiety disorder: Secondary | ICD-10-CM

## 2019-02-02 DIAGNOSIS — F32A Depression, unspecified: Secondary | ICD-10-CM

## 2019-02-02 DIAGNOSIS — F410 Panic disorder [episodic paroxysmal anxiety] without agoraphobia: Secondary | ICD-10-CM

## 2019-02-02 DIAGNOSIS — F329 Major depressive disorder, single episode, unspecified: Secondary | ICD-10-CM

## 2019-02-05 ENCOUNTER — Other Ambulatory Visit: Payer: Self-pay | Admitting: Psychiatry

## 2019-02-05 DIAGNOSIS — F410 Panic disorder [episodic paroxysmal anxiety] without agoraphobia: Secondary | ICD-10-CM

## 2019-02-05 DIAGNOSIS — F411 Generalized anxiety disorder: Secondary | ICD-10-CM

## 2019-02-05 DIAGNOSIS — F32A Depression, unspecified: Secondary | ICD-10-CM

## 2019-02-05 DIAGNOSIS — F329 Major depressive disorder, single episode, unspecified: Secondary | ICD-10-CM

## 2019-02-13 ENCOUNTER — Other Ambulatory Visit: Payer: Self-pay | Admitting: Family Medicine

## 2019-02-13 ENCOUNTER — Other Ambulatory Visit: Payer: Self-pay | Admitting: Cardiology

## 2019-02-13 DIAGNOSIS — I10 Essential (primary) hypertension: Secondary | ICD-10-CM

## 2019-02-16 ENCOUNTER — Telehealth: Payer: Self-pay | Admitting: Family Medicine

## 2019-02-16 NOTE — Telephone Encounter (Signed)
-----   Message from Jerilee Field, [place] sent at 02/15/2019 11:45 AM EDT ----- Patient is due for follow up, please call the patient to make an appointment.  Thanks. MPulliam, CMA/RT(R)

## 2019-02-25 ENCOUNTER — Encounter: Payer: Self-pay | Admitting: Family Medicine

## 2019-02-26 ENCOUNTER — Telehealth: Payer: BLUE CROSS/BLUE SHIELD | Admitting: Family

## 2019-02-26 DIAGNOSIS — A049 Bacterial intestinal infection, unspecified: Secondary | ICD-10-CM | POA: Diagnosis not present

## 2019-02-26 DIAGNOSIS — R110 Nausea: Secondary | ICD-10-CM | POA: Diagnosis not present

## 2019-02-26 MED ORDER — CIPROFLOXACIN HCL 500 MG PO TABS: 500.0000 mg | ORAL_TABLET | Freq: Two times a day (BID) | ORAL | 0 refills | Status: DC

## 2019-02-26 MED ORDER — ONDANSETRON HCL 4 MG PO TABS: 4.0000 mg | ORAL_TABLET | Freq: Three times a day (TID) | ORAL | 0 refills | Status: DC | PRN

## 2019-02-26 NOTE — Progress Notes (Signed)
Greater than 5 minutes, yet less than 10 minutes of time have been spent researching, coordinating, and implementing care for this patient today.  Thank you for the details you included in the comment boxes. Those details are very helpful in determining the best course of treatment for you and help [place] to provide the best care.  If you do not begin to show improvement within 48h and/or if you feel dehydrated severely to the point of major weakness of loss of consciousness, seek emergency medical attention. See plan below.  We are sorry that you are not feeling well.  Here is how we plan to help!  Based on what you have shared with me it looks like you have Acute Infectious Diarrhea.  Most cases of acute diarrhea are due to infections with virus and bacteria and are self-limited conditions lasting less than 14 days.  For your symptoms you may take Imodium 2 mg tablets that are over the counter at your local pharmacy. Take two tablet now and then one after each loose stool up to 6 a day.  Antibiotics are not needed for most people with diarrhea.  : Zofran 4 mg 1 tablet every 8 hours as needed for nausea and vomiting  : I have sent in Cipro 500 mg tablet twice a day for five days   HOME CARE  We recommend changing your diet to help with your symptoms for the next few days.  Drink plenty of fluids that contain water salt and sugar. Sports drinks such as Gatorade may help.   You may try broths, soups, bananas, applesauce, soft breads, mashed potatoes or crackers.   You are considered infectious for as long as the diarrhea continues. Hand washing or use of alcohol based hand sanitizers is recommend.  It is best to stay out of work or school until your symptoms stop.   GET HELP RIGHT AWAY  If you have dark yellow colored urine or do not pass urine frequently you should drink more fluids.    If your symptoms worsen   If you feel like you are going to pass out (faint)  You have a new  problem  MAKE SURE YOU   Understand these instructions.  Will watch your condition.  Will get help right away if you are not doing well or get worse.  Your e-visit answers were reviewed by a board certified advanced clinical practitioner to complete your personal care plan.  Depending on the condition, your plan could have included both over the counter or prescription medications.  If there is a problem please reply  once you have received a response from your provider.  Your safety is important to [place].  If you have drug allergies check your prescription carefully.    You can use MyChart to ask questions about today's visit, request a non-urgent call back, or ask for a work or school excuse for 24 hours related to this e-Visit. If it has been greater than 24 hours you will need to follow up with your provider, or enter a new e-Visit to address those concerns.   You will get an e-mail in the next two days asking about your experience.  I hope that your e-visit has been valuable and will speed your recovery. Thank you for using e-visits.

## 2019-03-04 ENCOUNTER — Encounter: Payer: Self-pay | Admitting: Family Medicine

## 2019-03-09 ENCOUNTER — Other Ambulatory Visit: Payer: Self-pay

## 2019-03-09 ENCOUNTER — Encounter (INDEPENDENT_AMBULATORY_CARE_PROVIDER_SITE_OTHER): Payer: BC Managed Care – PPO | Admitting: Family Medicine

## 2019-03-10 ENCOUNTER — Encounter: Payer: Self-pay | Admitting: Family Medicine

## 2019-03-11 ENCOUNTER — Other Ambulatory Visit: Payer: Self-pay

## 2019-03-11 ENCOUNTER — Other Ambulatory Visit: Payer: BC Managed Care – PPO

## 2019-03-11 DIAGNOSIS — R109 Unspecified abdominal pain: Secondary | ICD-10-CM

## 2019-03-11 DIAGNOSIS — R197 Diarrhea, unspecified: Secondary | ICD-10-CM

## 2019-03-12 LAB — CLOSTRIDIUM DIFFICILE BY PCR: Toxigenic C. Difficile by PCR: NEGATIVE

## 2019-03-15 LAB — STOOL CULTURE: E coli, Shiga toxin Assay: NEGATIVE

## 2019-03-15 LAB — H. PYLORI ANTIGEN, STOOL

## 2019-03-16 LAB — OVA AND PARASITE EXAMINATION

## 2019-03-22 ENCOUNTER — Other Ambulatory Visit: Payer: Self-pay

## 2019-03-22 ENCOUNTER — Ambulatory Visit (INDEPENDENT_AMBULATORY_CARE_PROVIDER_SITE_OTHER): Payer: BC Managed Care – PPO | Admitting: Family Medicine

## 2019-03-22 ENCOUNTER — Encounter: Payer: Self-pay | Admitting: Family Medicine

## 2019-03-22 VITALS — Ht 67.0 in | Wt 218.0 lb

## 2019-03-22 DIAGNOSIS — E669 Obesity, unspecified: Secondary | ICD-10-CM

## 2019-03-22 DIAGNOSIS — R7303 Prediabetes: Secondary | ICD-10-CM

## 2019-03-22 DIAGNOSIS — R195 Other fecal abnormalities: Secondary | ICD-10-CM

## 2019-03-22 DIAGNOSIS — E785 Hyperlipidemia, unspecified: Secondary | ICD-10-CM

## 2019-03-22 DIAGNOSIS — I10 Essential (primary) hypertension: Secondary | ICD-10-CM | POA: Diagnosis not present

## 2019-03-22 DIAGNOSIS — E039 Hypothyroidism, unspecified: Secondary | ICD-10-CM

## 2019-03-22 DIAGNOSIS — K580 Irritable bowel syndrome with diarrhea: Secondary | ICD-10-CM | POA: Diagnosis not present

## 2019-03-22 DIAGNOSIS — E781 Pure hyperglyceridemia: Secondary | ICD-10-CM

## 2019-03-22 DIAGNOSIS — E559 Vitamin D deficiency, unspecified: Secondary | ICD-10-CM

## 2019-03-22 MED ORDER — DICYCLOMINE HCL 10 MG PO CAPS: 10.0000 mg | ORAL_CAPSULE | Freq: Three times a day (TID) | ORAL | 1 refills | Status: DC

## 2019-03-22 NOTE — Addendum Note (Signed)
Addended by: Lanier Prude D on: 03/22/2019 12:01 PM   Modules accepted: Orders

## 2019-03-22 NOTE — Progress Notes (Signed)
Telehealth office visit note for [name], D.O- at Primary Care at [facility]   I connected with current patient today and verified that I am speaking with the correct person using two identifiers.   . Location of the patient: Home . Location of the provider: Office Only the patient (+/- their family members at pt's discretion) and myself were participating in the encounter    - This visit type was conducted due to national recommendations for restrictions regarding the COVID-19 Pandemic (e.g. social distancing) in an effort to limit this patient's exposure and mitigate transmission in our community.  This format is felt to be most appropriate for this patient at this time.   - The patient did not have access to video technology or had technical difficulties with video requiring transitioning to audio format only. - No physical exam could be performed with this format, beyond that communicated to [place] by the patient/ family members as noted.   - Additionally my office staff/ schedulers discussed with the patient that there may be a monetary charge related to this service, depending on their medical insurance.   The patient expressed understanding, and agreed to proceed.       History of Present Illness:  - Pt's stool problems started when he found out that mom had CA end of Feb -  cramping and loose stools.   On 02/25/19:  Sent Message --->  Hey Doc! I have been having bouts of loose bm's and gut irritation, generally after I eat within a couple of hrs since I found out my mother had Cancer in March, I have taken a variety of OTC meds, pepto,immodium,ect, but things are just not getting better. Mty current weight is down to 221 lbs, I also get extreme gas pain and bloating no matter what I eat, getting a little tired of it, other then that I feel great. Let me know what you think???  Had diarhea-  Was out of work for 5 days- on cipro which helped via an e-visit.  On 03/11/19-->  We  obtained stool cxs   Now still loose stools at times.  Depends on what he eats.  Patient has not identified certain foods that make the symptoms worse in particular.  He is not sure if emotions seem to make it worse but he thinks there might be a connection.  He has not noticed any blood, pus etc.  At times he says that the color and caliber changes- "looks weird ".   Patient has been otherwise feeling well except for some abdominal cramping at times.  No fever /chills, no recent travel, no new foods, no raw fish etc., no overnight stays camping or exposure to river water etc.     Impression and Recommendations:    1. Passage of loose stools-   2. ?able Irritable bowel syndrome with primarily diarrhea     -Discussed with patient to keep a food journal.  Try to identify foods that agree with him or disagree with him etc. -Discussed since he has mostly loose stools that he needs to increase fiber in his diet.  We discussed various ways he can do this even including over-the-counter psyllium husk's and fiber supplements etc. -Stool cultures, C. difficile etc. were all negative back on 03/11/2019.  -I have concerns this is IBS however, explained to patient this is a diagnosis of exclusion.   -We will send to gastroenterology for further work-up to make sure this change  in stool is not from an organic reason other than emotional stress. -Last colonoscopy was done in February 2018- polyp.  This was done with Dr. Arelia Longest and we will send patient to him for further evaluation since patient liked him.  - As part of my medical decision making, I reviewed the following data within the [place] History obtained from pt /family, CMA notes reviewed and incorporated if applicable, Labs reviewed, Radiograph/ tests reviewed if applicable and OV notes from prior OV's with me, as well as other specialists she/he has seen since seeing me last, were all reviewed and used in my medical decision making  process today.   - Additionally, discussion had with patient regarding txmnt plan, and their biases/concerns about that plan were used in my medical decision making today.   - The patient agreed with the plan and demonstrated an understanding of the instructions.   No barriers to understanding were identified.   - Red flag symptoms and signs discussed in detail.  Patient expressed understanding regarding what to do in case of emergency\ urgent symptoms.  The patient was advised to call back or seek an in-person evaluation if the symptoms worsen or if the condition fails to improve as anticipated.   Return if symptoms worsen or fail to improve-he will follow-up with GI for these symptoms in future, for chronic OV due early Nov- full set labs and then 2) OV after- HLD, Pre-DM, htn ETC...    Orders Placed This Encounter  Procedures  . Ambulatory referral to Gastroenterology    Meds ordered this encounter  Medications  . dicyclomine (BENTYL) 10 MG capsule    Sig: Take 1 capsule (10 mg total) by mouth 4 (four) times daily -  before meals and at bedtime.    Dispense:  40 capsule    Refill:  1    Medications Discontinued During This Encounter  Medication Reason  . ciprofloxacin (CIPRO) 500 MG tablet Completed Course      I provided 22 minutes of non-face-to-face time during this encounter,with over 50% of the time in direct counseling on patients medical conditions/ medical concerns.  Additional time was spent with charting and coordination of care after the actual visit commenced.   Note:  This note was prepared with assistance of Dragon voice recognition software. Occasional wrong-word or sound-a-like substitutions may have occurred due to the inherent limitations of voice recognition software.  [name], DO     Patient Care Team    Relationship Specialty Notifications Start End  [name], DO PCP - General Family Medicine  09/26/16   Melida Quitter, MD Consulting  Physician Otolaryngology  09/26/16   Kennith Gain, MD Consulting Physician Allergy  09/26/16    Comment: [address] [place].   Gatha Mayer, MD Consulting Physician Gastroenterology  11/21/16      -Vitals obtained; medications/ allergies reconciled;  personal medical, social, Sx etc.histories were updated by CMA, reviewed by me and are reflected in chart   Patient Active Problem List   Diagnosis Date Noted  . Hypothyroidism 10/24/2016    Priority: High  . OAB (overactive bladder) 10/24/2016    Priority: High  . Prediabetes 09/05/2015    Priority: High  . Hypertriglyceridemia 09/05/2015    Priority: High  . Obesity, Class II, BMI 35-39.9, isolated 07/26/2015    Priority: High  . Smoker 07/26/2015    Priority: High  . Fatigue 10/24/2016    Priority: Medium  . Alcohol use 09/26/2016  Priority: Medium  . History of substance abuse ([place]) 09/26/2016    Priority: Medium  . Gout 07/26/2015    Priority: Medium  . Urinary frequency 10/24/2016    Priority: Low  . Low libido 09/26/2016    Priority: Low  . Environmental and seasonal allergies 09/26/2016    Priority: Low  . Family hx of colon cancer requiring screening colonoscopy 09/26/2016    Priority: Low  . Vitamin D deficiency 09/05/2015    Priority: Low  . Hyperlipidemia 01/06/2019  . Mood disorder ([place]) 10/05/2018  . Dyslipidemia 09/11/2018  . Essential hypertension 09/11/2018  . Chest pain 09/11/2018  . HTN (hypertension), malignant 09/02/2018  . Stress-related physiological response affecting physical condition 2018-09-22  . Death of family member 09-22-2018  . Sleeping difficulty 08/17/2018  . Acute reaction to situational stress 08/17/2018  . Irritability and anger 08/17/2018  . Low testosterone in male 08/17/2018  . Elevated blood pressure reading- w/o dx HTN 10/14/2017  . White coat syndrome without diagnosis of hypertension 10/14/2017  . Hx of colonic polyp 11/13/2016  . Hematochezia  09/26/2016  . Screening for colon cancer 09/26/2016  . Recurrent sinusitis 07/26/2015  . Cervical disc disease 07/26/2015     Current Meds  Medication Sig  . allopurinol (ZYLOPRIM) 300 MG tablet TAKE ONE TABLET BY MOUTH ONCE DAILY  . amLODipine (NORVASC) 5 MG tablet Take 1 tablet by mouth once daily  . Cholecalciferol (VITAMIN D3) 5000 units TABS 5,000 IU OTC vitamin D3 daily.  . Coenzyme Q10 (CO Q 10) 100 MG CAPS Take 3 capsules by mouth daily.  . fexofenadine (ALLEGRA) 180 MG tablet Take 180 mg by mouth daily.  . fluticasone (FLONASE) 50 MCG/ACT nasal spray Place 2 sprays into the nose daily.  . hydrochlorothiazide (MICROZIDE) 12.5 MG capsule Take 1 capsule by mouth once daily  . levothyroxine (SYNTHROID, LEVOTHROID) 50 MCG tablet TAKE 1 TABLET BY MOUTH ONCE DAILY BEFORE BREAKFAST  . losartan (COZAAR) 50 MG tablet Take 1 tablet by mouth once daily  . Multiple Vitamins-Minerals (SUPER MEGA VITE 75/BETA CARO PO) Take by mouth.  . Omega-3 Fatty Acids (FISH OIL) 1200 MG CAPS 2 p.o. twice daily  . ondansetron (ZOFRAN) 4 MG tablet Take 1 tablet (4 mg total) by mouth every 8 (eight) hours as needed for nausea or vomiting.  . Potassium 99 MG TABS Take 3-4 tablets by mouth daily.  . propranolol (INDERAL) 20 MG tablet Take 1 tablet (20 mg total) by mouth 4 (four) times daily.  . sertraline (ZOLOFT) 100 MG tablet Take 2 (200 mg) daily  . Zinc 50 MG CAPS Take 1 capsule by mouth daily.   Current Facility-Administered Medications for the 03/22/19 encounter (Office Visit) with [name], DO  Medication  . 0.9 %  sodium chloride infusion     Allergies:  No Known Allergies   ROS:  See above HPI for pertinent positives and negatives   Objective:   Height 5\' 7"  (1.702 m), weight 218 lb (98.9 kg).  (if some vitals are omitted, this means that patient was UNABLE to obtain them even though they were asked to get them prior to OV today.  They were asked to call us at their earliest  convenience with these once obtained. )  General: A & O * 3; sounds in no acute distress; in usual state of health.  Skin: Pt confirms warm and dry extremities and pink fingertips HEENT: Pt confirms lips non-cyanotic Chest: Patient confirms normal chest excursion and movement Respiratory: speaking  in full sentences, no conversational dyspnea; patient confirms no use of accessory muscles Psych: insight appears good, mood- appears full

## 2019-04-07 ENCOUNTER — Other Ambulatory Visit: Payer: Self-pay

## 2019-04-07 ENCOUNTER — Encounter (INDEPENDENT_AMBULATORY_CARE_PROVIDER_SITE_OTHER): Payer: BC Managed Care – PPO | Admitting: Family Medicine

## 2019-04-07 NOTE — Progress Notes (Signed)
Patient did not need f/u today. MPulliam, CMA/RT(R)  error

## 2019-04-08 ENCOUNTER — Encounter: Payer: Self-pay | Admitting: Internal Medicine

## 2019-04-08 ENCOUNTER — Ambulatory Visit (INDEPENDENT_AMBULATORY_CARE_PROVIDER_SITE_OTHER): Payer: BC Managed Care – PPO | Admitting: Internal Medicine

## 2019-04-08 ENCOUNTER — Telehealth: Payer: Self-pay | Admitting: General Surgery

## 2019-04-08 VITALS — Ht 67.5 in | Wt 215.0 lb

## 2019-04-08 DIAGNOSIS — R634 Abnormal weight loss: Secondary | ICD-10-CM

## 2019-04-08 DIAGNOSIS — K625 Hemorrhage of anus and rectum: Secondary | ICD-10-CM | POA: Diagnosis not present

## 2019-04-08 DIAGNOSIS — K529 Noninfective gastroenteritis and colitis, unspecified: Secondary | ICD-10-CM | POA: Diagnosis not present

## 2019-04-08 NOTE — Patient Instructions (Signed)
You will have a colonoscopy to investigate the rectal bleeding and diarrhea tomorrow at 4:00.  My staff will give you the full details.  We will use a MiraLAX prep.  I will be able to write you work notes for what you need tomorrow at that procedure.  I appreciate the opportunity to care for you. Gatha Mayer, MD, [facility]

## 2019-04-08 NOTE — Progress Notes (Signed)
TELEHEALTH ENCOUNTER IN SETTING OF COVID-19 PANDEMIC - REQUESTED BY PATIENT SERVICE PROVIDED BY TELEMEDECINE - TYPE: A/V by [employer] PATIENT LOCATION: Home PATIENT HAS CONSENTED TO TELEHEALTH VISIT PROVIDER LOCATION: OFFICE REFERRING PROVIDER:Opalski, Deborah, DO PARTICIPANTS OTHER THAN PATIENT:none TIME SPENT ON CALL: 15 mins 50 sec    Ralph Mack 53 y.o. [birth year] [id number]  Assessment & Plan:   Encounter Diagnoses  Name Primary?  . Chronic diarrhea Yes  . Rectal bleeding   . Loss of weight    I believe this needs evaluation with colonoscopy.  He has a negative infectious work-up and is significantly bothered by this and is missing work.  I have an opening tomorrow at 4 PM and we will put him in for that.  MiraLAX prep tonight.  It certainly possible he could have developed inflammatory bowel disease since his last colonoscopy in 01/19/2017.  At that time I thought the rectal bleeding was from hemorrhoids but he was not having abdominal cramps and diarrhea.  Situational stress are certainly present has had a bad year with loss of his father late last year and then his mother in 01-20-23.   The risks and benefits as well as alternatives of endoscopic procedure(s) have been discussed and reviewed. All questions answered. The patient agrees to proceed.   Covid-19 screening questions   Do you now or have you had a fever in the last 14 days? NO  Do you have any respiratory symptoms of shortness of breath or cough now or in the last 14 days? NO  Do you have any family members or close contacts with diagnosed or suspected Covid-19 in the past 14 days? NO  Have you been tested for Covid-19 and found to be positive? NO          Subjective:   Chief Complaint: Diarrhea  HPI The patient is a 53 year old white man with several months of crampy abdominal pain and diarrhea with urgency.  He has had a thorough work-up and evaluation by Dr. Raliegh Scarlet, with negative stool  cultures C. difficile ova and parasites.  Imodium Pepto-Bismol and dicyclomine have not been helpful.  He has rectal bleeding as well.  He had a colonoscopy by me in January 19, 2017 because of rectal bleeding and age and he had hemorrhoids no signs of inflammation and a 7 mm sessile serrated polyp removed.  He is not had fever or chills.  He is not had problems like this before.  He says his daughter has IBS.  The onset of symptoms did coincide with the diagnosis of his mother's terminal cancer in 01-20-23, she died 2 weeks after that.  His father died late last year so it has been a stressful year, he was out of work for 5 months due to panic attacks and anxiety related to that.  He has been losing weight as well.  A brat diet helped a bit.  Dairy restriction may be.  Nothing has resolved this.  He has had urgent defecation he has had 2 put a 5 gallon bucket with garbage bags and is sleeping quarters of his truck (he is a Education officer, community for [employer]) and he has cramps and concerns about urgent defecation and incontinence.  Wt Readings from Last 3 Encounters:  04/08/19 215 lb (97.5 kg)  03/22/19 218 lb (98.9 kg)  01/06/19 230 lb (104.3 kg)   He had to call out of work today because of the symptoms.  He has missed 10 days in the last  6 weeks. No Known Allergies Current Meds  Medication Sig  . allopurinol (ZYLOPRIM) 300 MG tablet TAKE ONE TABLET BY MOUTH ONCE DAILY  . amLODipine (NORVASC) 5 MG tablet Take 1 tablet by mouth once daily  . Cholecalciferol (VITAMIN D3) 5000 units TABS 5,000 IU OTC vitamin D3 daily.  . Coenzyme Q10 (CO Q 10) 100 MG CAPS Take 3 capsules by mouth daily.  Marland Kitchen dicyclomine (BENTYL) 10 MG capsule Take 1 capsule (10 mg total) by mouth 4 (four) times daily -  before meals and at bedtime.  . fexofenadine (ALLEGRA) 180 MG tablet Take 180 mg by mouth daily.  . fluticasone (FLONASE) 50 MCG/ACT nasal spray Place 2 sprays into the nose daily.  . hydrochlorothiazide (MICROZIDE) 12.5 MG capsule Take 1  capsule by mouth once daily  . levothyroxine (SYNTHROID, LEVOTHROID) 50 MCG tablet TAKE 1 TABLET BY MOUTH ONCE DAILY BEFORE BREAKFAST  . losartan (COZAAR) 50 MG tablet Take 1 tablet by mouth once daily  . Multiple Vitamins-Minerals (SUPER MEGA VITE 75/BETA CARO PO) Take by mouth.  . Omega-3 Fatty Acids (FISH OIL) 1200 MG CAPS 2 p.o. twice daily  . ondansetron (ZOFRAN) 4 MG tablet Take 1 tablet (4 mg total) by mouth every 8 (eight) hours as needed for nausea or vomiting.  . Potassium 99 MG TABS Take 3-4 tablets by mouth daily.  . propranolol (INDERAL) 20 MG tablet Take 1 tablet (20 mg total) by mouth 4 (four) times daily.  . sertraline (ZOLOFT) 100 MG tablet Take 2 (200 mg) daily  . Zinc 50 MG CAPS Take 1 capsule by mouth daily.   Past Medical History:  Diagnosis Date  . Allergy   . Arthritis   . Hx of colonic polyp 11/13/2016   10/2016 7 mm ssp/a - recall 2023  . Hypothyroidism   . Prediabetes   . Recurrent upper respiratory infection (URI)    Past Surgical History:  Procedure Laterality Date  . COLONOSCOPY  2018  . 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     family history includes Alcohol abuse in his maternal grandmother; Asthma in his mother; COPD in his mother; Cancer in his mother; Cancer - Other in his mother; Depression in his daughter and father; Diabetes in his paternal grandfather; Emphysema in his mother; Heart disease in his father; Kidney disease in his paternal grandfather; OCD in his daughter; Parkinson's disease in his father; Schizophrenia in his maternal grandmother.   Review of Systems As per HPI  Limited physical exam shows a well-developed well-nourished white man in no acute distress with  appropriate mood and affect  Data reviewed includes previous colonoscopy pathology colonoscopy images labs in the computer including a TSH of 1.76 in November 2019 hemoglobin was normal then as  well.

## 2019-04-08 NOTE — Telephone Encounter (Signed)
lvm for the patient to advise him he will need to have a healthcare partner drive him and remain during the procedure. They will both need to be wearing a mask.

## 2019-04-08 NOTE — Telephone Encounter (Signed)
Contacted the patient and sent him instructions through my chart for tomorrows colonoscopy. The patient received them as we were speaking. I also verified his email and sent all instructions, consents and AVS to his email at [email or web address].  The patient will bring signed consent to colonoscopy tomorrow.

## 2019-04-09 ENCOUNTER — Ambulatory Visit (AMBULATORY_SURGERY_CENTER): Payer: BC Managed Care – PPO | Admitting: Internal Medicine

## 2019-04-09 ENCOUNTER — Encounter: Payer: Self-pay | Admitting: Internal Medicine

## 2019-04-09 ENCOUNTER — Other Ambulatory Visit: Payer: Self-pay

## 2019-04-09 ENCOUNTER — Telehealth: Payer: Self-pay | Admitting: *Deleted

## 2019-04-09 VITALS — BP 117/76 | HR 57 | Temp 98.3°F | Resp 15 | Ht 67.5 in | Wt 215.0 lb

## 2019-04-09 DIAGNOSIS — K529 Noninfective gastroenteritis and colitis, unspecified: Secondary | ICD-10-CM

## 2019-04-09 DIAGNOSIS — R634 Abnormal weight loss: Secondary | ICD-10-CM

## 2019-04-09 DIAGNOSIS — Z86010 Personal history of colonic polyps: Secondary | ICD-10-CM

## 2019-04-09 DIAGNOSIS — K52832 Lymphocytic colitis: Secondary | ICD-10-CM | POA: Diagnosis present

## 2019-04-09 DIAGNOSIS — K625 Hemorrhage of anus and rectum: Secondary | ICD-10-CM

## 2019-04-09 MED ORDER — DICYCLOMINE HCL 20 MG PO TABS: 20.0000 mg | ORAL_TABLET | Freq: Three times a day (TID) | ORAL | 1 refills | Status: DC

## 2019-04-09 MED ORDER — SODIUM CHLORIDE 0.9 % IV SOLN: 500.0000 mL | Freq: Once | INTRAVENOUS | Status: DC

## 2019-04-09 MED ORDER — HYDROCORTISONE (PERIANAL) 2.5 % EX CREA: 1.0000 "application " | TOPICAL_CREAM | Freq: Two times a day (BID) | CUTANEOUS | 0 refills | Status: DC

## 2019-04-09 NOTE — Progress Notes (Signed)
A/ox3, pleased with MAC, report to RN 

## 2019-04-09 NOTE — Progress Notes (Signed)
Temperature and VS taken by [facility], [place]

## 2019-04-09 NOTE — Progress Notes (Signed)
Pt's states no medical or surgical changes since previsit or office visit. 

## 2019-04-09 NOTE — Telephone Encounter (Signed)
Pt. Called and routed to admitting. Patient states that he has not taken his blood pressure medication today and wants to know if he can take it before his procedure at 4pm today. Advised patient to go ahead and take his blood pressure medication now (before 1pm as per his instructions) and to make sure he doesn't drink anything after that time. Pt. Verbalizes understanding.

## 2019-04-09 NOTE — Patient Instructions (Addendum)
I did not see any inflammation, polyps or cancer.  You have inflamed hemorrhoids and the blood must be coming from them. My sense is that this is Irritable Bowel Syndrome. Please read the pamphlet.  I took biopsies to look for microscopic inflammation.  Try a higher dose of dicyclomine (20 mg) and hydrocortisone cream for the hemorrhoids. Prescription sent in.  I appreciate the opportunity to care for you. Gatha Mayer, MD, [facility]  Handouts given for hemorrhoids and hemorrhoid banding.   YOU HAD AN ENDOSCOPIC PROCEDURE TODAY AT [facility]:   Refer to the procedure report that was given to you for any specific questions about what was found during the examination.  If the procedure report does not answer your questions, please call your gastroenterologist to clarify.  If you requested that your care partner not be given the details of your procedure findings, then the procedure report has been included in a sealed envelope for you to review at your convenience later.  YOU SHOULD EXPECT: Some feelings of bloating in the abdomen. Passage of more gas than usual.  Walking can help get rid of the air that was put into your GI tract during the procedure and reduce the bloating. If you had a lower endoscopy (such as a colonoscopy or flexible sigmoidoscopy) you may notice spotting of blood in your stool or on the toilet paper. If you underwent a bowel prep for your procedure, you may not have a normal bowel movement for a few days.  Please Note:  You might notice some irritation and congestion in your nose or some drainage.  This is from the oxygen used during your procedure.  There is no need for concern and it should clear up in a day or so.  SYMPTOMS TO REPORT IMMEDIATELY:   Following lower endoscopy (colonoscopy or flexible sigmoidoscopy):  Excessive amounts of blood in the stool  Significant tenderness or worsening of abdominal pains  Swelling of the abdomen that is  new, acute  Fever of 100F or higher   For urgent or emergent issues, a gastroenterologist can be reached at any hour by calling [phone].   DIET:  We do recommend a small meal at first, but then you may proceed to your regular diet.  Drink plenty of fluids but you should avoid alcoholic beverages for 24 hours.  ACTIVITY:  You should plan to take it easy for the rest of today and you should NOT DRIVE or use heavy machinery until tomorrow (because of the sedation medicines used during the test).    FOLLOW UP: Our staff will call the number listed on your records 48-72 hours following your procedure to check on you and address any questions or concerns that you may have regarding the information given to you following your procedure. If we do not reach you, we will leave a message.  We will attempt to reach you two times.  During this call, we will ask if you have developed any symptoms of COVID 19. If you develop any symptoms (ie: fever, flu-like symptoms, shortness of breath, cough etc.) before then, please call [phone].  If you test positive for Covid 19 in the 2 weeks post procedure, please call and report this information to [place].    If any biopsies were taken you will be contacted by phone or by letter within the next 1-3 weeks.  Please call us at [phone] if you have not heard about the biopsies in 3 weeks.  SIGNATURES/CONFIDENTIALITY: You and/or your care partner have signed paperwork which will be entered into your electronic medical record.  These signatures attest to the fact that that the information above on your After Visit Summary has been reviewed and is understood.  Full responsibility of the confidentiality of this discharge information lies with you and/or your care-partner.

## 2019-04-09 NOTE — Op Note (Addendum)
Ralph Mack Patient Name: Treavon Castilleja Procedure Date: 04/09/2019 4:03 PM MRN: [id number] Endoscopist: Gatha Mayer , MD Age: 53 Referring MD:  Date of Birth: 23-Mar-1966 Gender: Male Account #: [id number] Procedure:                Colonoscopy Indications:              Clinically significant diarrhea of unexplained                            origin, Rectal bleeding Medicines:                Propofol per Anesthesia, Monitored Anesthesia Care Procedure:                Pre-Anesthesia Assessment:                           - Prior to the procedure, a History and Physical                            was performed, and patient medications and                            allergies were reviewed. The patient's tolerance of                            previous anesthesia was also reviewed. The risks                            and benefits of the procedure and the sedation                            options and risks were discussed with the patient.                            All questions were answered, and informed consent                            was obtained. Prior Anticoagulants: The patient has                            taken no previous anticoagulant or antiplatelet                            agents. ASA Grade Assessment: II - A patient with                            mild systemic disease. After reviewing the risks                            and benefits, the patient was deemed in                            satisfactory condition to undergo the procedure.  After obtaining informed consent, the colonoscope                            was passed under direct vision. Throughout the                            procedure, the patient's blood pressure, pulse, and                            oxygen saturations were monitored continuously. The                            Colonoscope was introduced through the anus and                            advanced to the  the terminal ileum, with                            identification of the appendiceal orifice and IC                            valve. The colonoscopy was performed without                            difficulty. The patient tolerated the procedure                            well. The quality of the bowel preparation was                            good. The terminal ileum, ileocecal valve,                            appendiceal orifice, and rectum were photographed.                            The bowel preparation used was Miralax via split                            dose instruction. Scope In: 4:19:14 PM Scope Out: 4:29:40 PM Scope Withdrawal Time: 0 hours 8 minutes 1 second  Total Procedure Duration: 0 hours 10 minutes 26 seconds  Findings:                 The perianal and digital rectal examinations were                            normal. Pertinent negatives include normal prostate                            (size, shape, and consistency).                           The terminal ileum appeared normal.  Internal hemorrhoids were found.                           The exam was otherwise without abnormality on                            direct and retroflexion views.                           Biopsies for histology were taken with a cold                            forceps from the right colon and left colon for                            evaluation of microscopic colitis. Complications:            No immediate complications. Estimated Blood Loss:     Estimated blood loss: none. Estimated blood loss:                            none. Impression:               - The examined portion of the ileum was normal.                           - Internal hemorrhoids.                           - The examination was otherwise normal on direct                            and retroflexion views.                           - Biopsies were taken with a cold forceps from the                             right colon and left colon for evaluation of                            microscopic colitis. Recommendation:           - Patient has a contact number available for                            emergencies. The signs and symptoms of potential                            delayed complications were discussed with the                            patient. Return to normal activities tomorrow.                            Written discharge instructions were provided to the  patient.                           - Resume previous diet.                           - Continue present medications.                           - Use Bentyl (dicyclomine) 20 mg PO QID 30 min AC.                            He was on 10 will try 20 mg And HC cream for                            hemorrhoids. Rx sent. Consider banding if desired                           SUSPECT IBS AND BLEEDING HEMORRHOIDS Gatha Mayer, MD 04/09/2019 4:38:32 PM This report has been signed electronically.

## 2019-04-13 ENCOUNTER — Telehealth: Payer: Self-pay

## 2019-04-13 ENCOUNTER — Telehealth: Payer: Self-pay | Admitting: *Deleted

## 2019-04-13 NOTE — Telephone Encounter (Signed)
Left message on follow up call. 

## 2019-04-13 NOTE — Telephone Encounter (Signed)
  Follow up Call-  Call back number 04/09/2019 11/08/2016  Post procedure Call Back phone  # [phone]  Permission to leave phone message Yes Yes  Some recent data might be hidden     No answer at # given.  LM on VM.

## 2019-04-19 ENCOUNTER — Other Ambulatory Visit: Payer: Self-pay | Admitting: Family Medicine

## 2019-04-19 ENCOUNTER — Encounter: Payer: Self-pay | Admitting: Internal Medicine

## 2019-04-19 DIAGNOSIS — M1000 Idiopathic gout, unspecified site: Secondary | ICD-10-CM

## 2019-04-19 DIAGNOSIS — K52832 Lymphocytic colitis: Secondary | ICD-10-CM | POA: Insufficient documentation

## 2019-04-19 HISTORY — DX: Lymphocytic colitis: K52.832

## 2019-04-19 NOTE — Progress Notes (Signed)
5 yr colon recall  ([facility]) No [facility] letter  Call from office re: microscopic colitis If dicyclomine not controlling things then we need to start budesonide 3 mg caps take 3 a day # 90 1 refill  He needs an OV me (would do in person in case he needs hemorrhoid Tx) in about 1 month  Thanks

## 2019-04-23 ENCOUNTER — Telehealth: Payer: Self-pay | Admitting: Internal Medicine

## 2019-04-27 ENCOUNTER — Other Ambulatory Visit: Payer: Self-pay | Admitting: Family Medicine

## 2019-04-27 DIAGNOSIS — F438 Other reactions to severe stress: Secondary | ICD-10-CM

## 2019-04-27 DIAGNOSIS — I10 Essential (primary) hypertension: Secondary | ICD-10-CM

## 2019-04-27 DIAGNOSIS — F4389 Other reactions to severe stress: Secondary | ICD-10-CM

## 2019-04-27 DIAGNOSIS — M1000 Idiopathic gout, unspecified site: Secondary | ICD-10-CM

## 2019-05-03 ENCOUNTER — Other Ambulatory Visit: Payer: Self-pay | Admitting: Family Medicine

## 2019-05-03 DIAGNOSIS — M1000 Idiopathic gout, unspecified site: Secondary | ICD-10-CM

## 2019-05-03 NOTE — Telephone Encounter (Signed)
Patient came by office states he called pharmacy for refill on :  (but was reminded that Dr. Raliegh Scarlet has NEVER PRESCRIBED THIS Medication)  allopurinol (ZYLOPRIM) 300 MG tablet [[id number]]   Order Details Dose, Route, Frequency: As Directed  Dispense Quantity: 90 tablet Refills: 3 Fills remaining: --        Sig: TAKE ONE TABLET BY MOUTH ONCE DAILY     ---- Forwarding request to medical assistant to send refill order to :   [facility] ([address] [place] [address] [phone] (Phone) [phone] (Fax)   --glh

## 2019-05-04 NOTE — Telephone Encounter (Signed)
Medication was last filled by pervious provider.  LOV 03/22/2019.  Please review and advise. MPulliam, CMA/RT(R)

## 2019-05-04 NOTE — Telephone Encounter (Signed)
I have given this medicine to the patient in the past.  If he feels he still needs it, ok to RF#90, no rf- will need labs

## 2019-05-05 MED ORDER — ALLOPURINOL 300 MG PO TABS: 300.0000 mg | ORAL_TABLET | Freq: Every day | ORAL | 0 refills | Status: DC

## 2019-05-17 ENCOUNTER — Encounter: Payer: Self-pay | Admitting: Family Medicine

## 2019-05-19 ENCOUNTER — Telehealth: Payer: Self-pay | Admitting: Family Medicine

## 2019-05-19 NOTE — Telephone Encounter (Signed)
Patient walked into office requesting an OV appt with PCP to discuss BP concerns/dizzy spells. He says his BP has been lower than normal (116/82) and that when he stands up too fast that he gets a dizzy sensation. He is scheduled for an e visit on Tuesday to discuss this with PCP but wants to know if there is anything in the mean time he should be doing different to help. Please advise

## 2019-05-19 NOTE — Telephone Encounter (Signed)
LVM for pt to call to discuss.  T. Nelson, CMA  

## 2019-05-20 NOTE — Telephone Encounter (Signed)
toNYA-  Patient was last seen by his cardiologist in March 2020.  Dr. Percival Spanish.  Vitals at that time were:VS:  BP 110/70 (BP Location: Left Arm, Patient Position: Sitting, Cuff Size: Large)   Pulse 76   Ht 5\' 7"  (1.702 m)   Wt 236 lb (107 kg)   BMI 36.96 kg/m  , BMI Body mass index is 36.96 kg/m.  -Since Dr. Percival Spanish gives him his amlodipine for blood pressure control and felt that the 110/70 was perfect per his last notes.  I certainly do not feel that 116/82 is too low blood pressure for this patient at all either.  There is nothing additional that patient needs to do as his blood pressure is in the normal range.  I would recommend he adequately hydrates which is over one half of his weight in ounces water per day as most of the time we can have some orthostatic dizziness which is what his symptoms are consistent with when we are sweating a lot and not replenishing are hydration status during the warm summer months.  Otherwise with these tactics, if his symptoms do not improve, I recommend he follow-up with his cardiologist

## 2019-05-20 NOTE — Telephone Encounter (Signed)
Per Dr. Raliegh Scarlet, she personally spoke with pt and advised him of this information.  Charyl Bigger, CMA

## 2019-05-24 ENCOUNTER — Encounter: Payer: Self-pay | Admitting: Internal Medicine

## 2019-05-24 ENCOUNTER — Ambulatory Visit (INDEPENDENT_AMBULATORY_CARE_PROVIDER_SITE_OTHER): Payer: BC Managed Care – PPO | Admitting: Internal Medicine

## 2019-05-24 DIAGNOSIS — K580 Irritable bowel syndrome with diarrhea: Secondary | ICD-10-CM | POA: Diagnosis not present

## 2019-05-24 DIAGNOSIS — K52832 Lymphocytic colitis: Secondary | ICD-10-CM | POA: Diagnosis not present

## 2019-05-24 MED ORDER — DICYCLOMINE HCL 20 MG PO TABS: 20.0000 mg | ORAL_TABLET | Freq: Three times a day (TID) | ORAL | 1 refills | Status: DC

## 2019-05-24 NOTE — Patient Instructions (Signed)
Glad things are better.  I have refilled the dicyclomine as we discussed.  See how you do by coming off it - would start by dropping the bedtime dose and then work on the other doses to see if you can get by with less or none but keep with you to take as needed.  If this does not work out or other problems occur, let me know.  I appreciate the opportunity to care for you. Gatha Mayer, MD, [facility]

## 2019-05-24 NOTE — Assessment & Plan Note (Signed)
Doing well on dicyclomine 20 mg qid In midst of situational stressors ? Inflammatory IBS issue as did not require steroids  He will see if he can taper down on dicyclomine  #90 day supply wth 1 refill Rxed  Call back prn

## 2019-05-24 NOTE — Progress Notes (Signed)
TELEHEALTH ENCOUNTER IN SETTING OF COVID-19 PANDEMIC - REQUESTED BY PATIENT SERVICE PROVIDED BY TELEMEDECINE - TYPE: phone PATIENT LOCATION: home PATIENT HAS CONSENTED TO TELEHEALTH VISIT PROVIDER LOCATION: OFFICE REFERRING PROVIDER:D. Oplaski PARTICIPANTS OTHER THAN PATIENT:none TIME SPENT ON CALL:5 mins 21 sec    Ralph Mack 53 y.o. 17-Mar-1966 [id number]  Assessment & Plan:   Lymphocytic colitis Doing well on dicyclomine 20 mg qid In midst of situational stressors ? Inflammatory IBS issue as did not require steroids  He will see if he can taper down on dicyclomine  #90 day supply wth 1 refill Rxed  Call back prn    I appreciate the opportunity to care for this patient.  [id number], Neoma Laming, DO    Subjective:   Chief Complaint: f/u lymphocytic colitis  HPI The patient is having follow-up after being diagnosed with lymphocytic colitis the colonoscopy about 5 weeks ago.  He was treated with dicyclomine prior to the pathology coming back and at 20 mg 4 times a day has done very well without diarrhea or problems.  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  Hemorrhoids are not bleeding anymore now that the diarrhea is controlled.  He is very satisfied with the symptom control.  He denies any particular side effects from the medication.  Note that these problems developed in the midst of deaths of a family members (mother and father in the past year). No Known Allergies Current Meds  Medication Sig  . allopurinol (ZYLOPRIM) 300 MG tablet Take 1 tablet (300 mg total) by mouth daily.  Marland Kitchen amLODipine (NORVASC) 5 MG tablet Take 1 tablet by mouth once daily  . Cholecalciferol (VITAMIN D3) 5000 units TABS 5,000 IU OTC vitamin D3 daily.  . Coenzyme Q10 (CO Q 10) 100 MG CAPS Take 3 capsules by mouth daily.  Marland Kitchen dicyclomine (BENTYL) 20 MG tablet Take 1 tablet (20 mg total) by mouth 4 (four) times daily -  before meals and at bedtime.  . fexofenadine (ALLEGRA) 180 MG  tablet Take 180 mg by mouth daily.  . fluticasone (FLONASE) 50 MCG/ACT nasal spray Place 2 sprays into the nose daily.  . hydrochlorothiazide (MICROZIDE) 12.5 MG capsule Take 1 capsule by mouth once daily  . levothyroxine (SYNTHROID, LEVOTHROID) 50 MCG tablet TAKE 1 TABLET BY MOUTH ONCE DAILY BEFORE BREAKFAST  . losartan (COZAAR) 50 MG tablet Take 1 tablet by mouth once daily  . Multiple Vitamins-Minerals (SUPER MEGA VITE 75/BETA CARO PO) Take by mouth.  . Omega-3 Fatty Acids (FISH OIL) 1200 MG CAPS 2 p.o. twice daily  . ondansetron (ZOFRAN) 4 MG tablet Take 1 tablet (4 mg total) by mouth every 8 (eight) hours as needed for nausea or vomiting.  . Potassium 99 MG TABS Take 3-4 tablets by mouth daily.  . propranolol (INDERAL) 20 MG tablet Take 1 tablet by mouth 4 times daily  . sertraline (ZOLOFT) 100 MG tablet Take 2 (200 mg) daily  . Zinc 50 MG CAPS Take 1 capsule by mouth daily.   Past Medical History:  Diagnosis Date  . Allergy   . Arthritis   . Hx of colonic polyp 11/13/2016   10/2016 7 mm ssp/a - recall 2023  . Hypothyroidism   . Lymphocytic colitis 04/19/2019  . Prediabetes   . Recurrent upper respiratory infection (URI)    Past Surgical History:  Procedure Laterality Date  . COLONOSCOPY  2018  . 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     family history includes Alcohol abuse in his maternal grandmother; Asthma in his mother; COPD in his mother; Cancer in his mother; Cancer - Other in his mother; Depression in his daughter and father; Diabetes in his paternal grandfather; Emphysema in his mother; Heart disease in his father; Kidney disease in his paternal grandfather; OCD in his daughter; Parkinson's disease in his father; Schizophrenia in his maternal grandmother.   Review of Systems As above

## 2019-05-25 ENCOUNTER — Other Ambulatory Visit: Payer: Self-pay

## 2019-05-25 ENCOUNTER — Encounter: Payer: Self-pay | Admitting: Family Medicine

## 2019-05-25 ENCOUNTER — Other Ambulatory Visit: Payer: Self-pay | Admitting: Family Medicine

## 2019-05-25 ENCOUNTER — Ambulatory Visit (INDEPENDENT_AMBULATORY_CARE_PROVIDER_SITE_OTHER): Payer: BC Managed Care – PPO | Admitting: Family Medicine

## 2019-05-25 VITALS — BP 116/82 | HR 74 | Ht 67.5 in | Wt 218.0 lb

## 2019-05-25 DIAGNOSIS — I10 Essential (primary) hypertension: Secondary | ICD-10-CM | POA: Diagnosis not present

## 2019-05-25 DIAGNOSIS — R42 Dizziness and giddiness: Secondary | ICD-10-CM | POA: Insufficient documentation

## 2019-05-25 DIAGNOSIS — K580 Irritable bowel syndrome with diarrhea: Secondary | ICD-10-CM | POA: Insufficient documentation

## 2019-05-25 DIAGNOSIS — E039 Hypothyroidism, unspecified: Secondary | ICD-10-CM

## 2019-05-25 NOTE — Progress Notes (Signed)
Virtual / live video office visit note for [employer], D.O- Primary Care Physician at [facility]   I connected with current patient today and beyond visually recognizing the correct individual, I verified that I am speaking with the correct person using two identifiers.  . Location of the patient: Home . Location of the provider: Office Only the patient (+/- their family members at pt's discretion) and myself were participating in the encounter    - This visit type was conducted due to national recommendations for restrictions regarding the COVID-19 Pandemic (e.g. social distancing) in an effort to limit this patient's exposure and mitigate transmission in our community.  This format is felt to be most appropriate for this patient at this time.   - The patient did have access to video technology today yet, we had technical difficulties with this method, requiring transitioning to audio only over half way thru.    - No physical exam could be performed with this format, beyond that communicated to [place] by the patient/ family members as noted.   - Additionally my office staff/ schedulers discussed with the patient that there may be a monetary charge related to this service, depending on patient's medical insurance.   The patient expressed understanding, and agreed to proceed.      History of Present Illness:  Occasional Lightheadedness - BP Meds Patient feeling occasional dizziness and wondering if it is related to BP.    States for the last 2-3 weeks, when he gets up from watching the TV or sitting for too long, he gets lightheaded.  This has been mainly occurring when he's at home.  States it's "weird."  Denies activities bringing on dizziness aside from standing up from sitting.  "If we're watching TV for an hour and I have to get up, it's like woooo."  States his BP last check when he stood up was 116/82 with a pulse of 74. At the end of the day "after a little bit of  activity, it was 133."  The last time he's been checking, his BP was running 124/75-76, "but I didn't make any notations of it, that's just in my head."  Per patient, continues on BP meds as prescribed. Taking propanolol four times daily, HCTZ once daily, losartan once daily, and amlodipine once daily.  Hydration States he drinks about three 32 ounce bottles of Gatorade per day, along with probably three 16 ounce bottles of water per day.  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  Says "I'm not out of the truck more than 10 minutes at a time."   Impression and Recommendations:    1. HTN (hypertension), malignant   2. Orthostatic lightheadedness     Malignant Hypertension - Reviewed goal average blood pressure of 130's/80's. - BP well-controlled on current management.  See med list. - Patient reports good compliance on medications.  - However, pt reports feelings of lightheadedness when standing after long periods of sitting. - Reviewed that BP shifts can cause feelings of lightheadedness - During appointment, discussed orthostatic hypotension with patient at length.  - Discussed that as stress levels decrease, meds may be able to be reduced. - Before changing meds, reviewed need to know the patient's baseline BP and pulse. - Daily ambulatory blood pressure monitoring discussed extensively and encouraged.  - Patient knows to check his BP a couple of times per day to establish baseline. - Advised patient to check his BP right  after he experiences lightheaded/dizzy sx. - Patient will get back in touch with log of blood pressures in near future.  - STRONGLY encouraged adequate hydration.  - Will continue to monitor.  Recommendations - Discussed need for fasting blood work in near future. - Patient understands need to return for lab review and f/up as scheduled.   - As part of my medical decision making, I reviewed the following data within the [facility]  History obtained from pt /family, CMA notes reviewed and incorporated if applicable, Labs reviewed, Radiograph/ tests reviewed if applicable and OV notes from prior OV's with me, as well as other specialists she/he has seen since seeing me last, were all reviewed and used in my medical decision making process today.   - Additionally, discussion had with patient regarding txmnt plan, their biases about that plan etc were used in my medical decision making today.   - The patient agreed with the plan and demonstrated an understanding of the instructions.   No barriers to understanding were identified.   - Red flag symptoms and signs discussed in detail.  Patient expressed understanding regarding what to do in case of emergency\ urgent symptoms.  The patient was advised to call back or seek an in-person evaluation if the symptoms worsen or if the condition fails to improve as anticipated.   Return for Follow-up 2-3 weeks bring BP log; FBW 3 days prior.    Note:  This note was prepared with assistance of Dragon voice recognition software. Occasional wrong-word or sound-a-like substitutions may have occurred due to the inherent limitations of voice recognition software.  This document serves as a record of services personally performed by [name], DO. It was created on her behalf by Toni Amend, a trained medical scribe. The creation of this record is based on the scribe's personal observations and the provider's statements to them.   I have reviewed the above medical documentation for accuracy and completeness and I concur.  [name], DO 05/29/2019 11:58 PM       Patient Care Team    Relationship Specialty Notifications Start End  [name], DO PCP - General Family Medicine  09/26/16   Melida Quitter, MD Consulting Physician Otolaryngology  09/26/16   Kennith Gain, MD Consulting Physician Allergy  09/26/16    Comment: [address] [place].   Gatha Mayer, MD Consulting Physician Gastroenterology  11/21/16   Purnell Shoemaker., MD Attending Physician Psychiatry  05/25/19     -Vitals obtained; medications/ allergies reconciled;  personal medical, social, Sx etc.histories were updated by CMA, reviewed by me and are reflected in chart  Patient Active Problem List   Diagnosis Date Noted  . Hypothyroidism 10/24/2016    Priority: High  . OAB (overactive bladder) 10/24/2016    Priority: High  . Prediabetes 09/05/2015    Priority: High  . Hypertriglyceridemia 09/05/2015    Priority: High  . Obesity, Class II, BMI 35-39.9, isolated 07/26/2015    Priority: High  . Smoker 07/26/2015    Priority: High  . Fatigue 10/24/2016    Priority: Medium  . Alcohol use 09/26/2016    Priority: Medium  . History of substance abuse ([place]) 09/26/2016    Priority: Medium  . Gout 07/26/2015    Priority: Medium  . Urinary frequency 10/24/2016    Priority: Low  . Low libido 09/26/2016    Priority: Low  . Environmental and seasonal allergies 09/26/2016    Priority: Low  . Family  hx of colon cancer requiring screening colonoscopy 09/26/2016    Priority: Low  . Vitamin D deficiency 09/05/2015    Priority: Low  . ?able Irritable bowel syndrome with primarily diarrhea 05/25/2019  . Orthostatic lightheadedness 05/25/2019  . Lymphocytic colitis 04/19/2019  . Hyperlipidemia 01/06/2019  . Mood disorder ([place]) 10/05/2018  . Dyslipidemia 09/11/2018  . Essential hypertension 09/11/2018  . Chest pain 09/11/2018  . HTN (hypertension), malignant 09/02/2018  . Stress-related physiological response affecting physical condition Sep 08, 2018  . Death of family member 09/08/2018  . Sleeping difficulty 08/17/2018  . Acute reaction to situational stress 08/17/2018  . Irritability and anger 08/17/2018  . Low testosterone in male 08/17/2018  . Elevated blood pressure reading- w/o dx HTN 10/14/2017  . White coat syndrome without diagnosis of hypertension 10/14/2017   . Hx of colonic polyp 11/13/2016  . Hematochezia 09/26/2016  . Recurrent sinusitis 07/26/2015  . Cervical disc disease 07/26/2015     Current Meds  Medication Sig  . allopurinol (ZYLOPRIM) 300 MG tablet Take 1 tablet (300 mg total) by mouth daily.  Marland Kitchen amLODipine (NORVASC) 5 MG tablet Take 1 tablet by mouth once daily  . Cholecalciferol (VITAMIN D3) 5000 units TABS 5,000 IU OTC vitamin D3 daily.  . Coenzyme Q10 (CO Q 10) 100 MG CAPS Take 3 capsules by mouth daily.  Marland Kitchen dicyclomine (BENTYL) 20 MG tablet Take 1 tablet (20 mg total) by mouth 4 (four) times daily -  before meals and at bedtime. As needed  . fexofenadine (ALLEGRA) 180 MG tablet Take 180 mg by mouth daily.  . fluticasone (FLONASE) 50 MCG/ACT nasal spray Place 2 sprays into the nose daily.  . hydrochlorothiazide (MICROZIDE) 12.5 MG capsule Take 1 capsule by mouth once daily  . losartan (COZAAR) 50 MG tablet Take 1 tablet by mouth once daily  . Multiple Vitamins-Minerals (SUPER MEGA VITE 75/BETA CARO PO) Take by mouth.  . Omega-3 Fatty Acids (FISH OIL) 1200 MG CAPS 2 p.o. twice daily  . Potassium 99 MG TABS Take 3-4 tablets by mouth daily.  . propranolol (INDERAL) 20 MG tablet Take 1 tablet by mouth 4 times daily  . sertraline (ZOLOFT) 100 MG tablet Take 2 (200 mg) daily  . Zinc 50 MG CAPS Take 1 capsule by mouth daily.  . [DISCONTINUED] levothyroxine (SYNTHROID, LEVOTHROID) 50 MCG tablet TAKE 1 TABLET BY MOUTH ONCE DAILY BEFORE BREAKFAST     No Known Allergies   ROS:  See above HPI for pertinent positives and negatives   Objective:   Blood pressure 116/82, pulse 74, height 5' 7.5" (1.715 m), weight 218 lb (98.9 kg).  (if some vitals are omitted, this means that patient was UNABLE to obtain them even though they were asked to get them prior to OV today.  They were asked to call us at their earliest convenience with these once obtained.)  General: A & O * 3; visually in no acute distress; in usual state of health.  Skin:  Visible skin appears normal and pt's usual skin color HEENT:  EOMI, head is normocephalic and atraumatic.  Sclera are anicteric. Neck has a good range of motion.  Lips are noncyanotic Chest: normal chest excursion and movement Respiratory: speaking in full sentences, no conversational dyspnea; no use of accessory muscles Psych: insight good, mood- appears full

## 2019-06-01 ENCOUNTER — Other Ambulatory Visit: Payer: BC Managed Care – PPO

## 2019-06-01 ENCOUNTER — Other Ambulatory Visit: Payer: Self-pay

## 2019-06-01 DIAGNOSIS — E785 Hyperlipidemia, unspecified: Secondary | ICD-10-CM

## 2019-06-01 DIAGNOSIS — E039 Hypothyroidism, unspecified: Secondary | ICD-10-CM

## 2019-06-01 DIAGNOSIS — E669 Obesity, unspecified: Secondary | ICD-10-CM

## 2019-06-01 DIAGNOSIS — K580 Irritable bowel syndrome with diarrhea: Secondary | ICD-10-CM

## 2019-06-01 DIAGNOSIS — E559 Vitamin D deficiency, unspecified: Secondary | ICD-10-CM

## 2019-06-01 DIAGNOSIS — I10 Essential (primary) hypertension: Secondary | ICD-10-CM

## 2019-06-01 DIAGNOSIS — R195 Other fecal abnormalities: Secondary | ICD-10-CM

## 2019-06-01 DIAGNOSIS — R7303 Prediabetes: Secondary | ICD-10-CM

## 2019-06-01 DIAGNOSIS — E781 Pure hyperglyceridemia: Secondary | ICD-10-CM

## 2019-06-02 LAB — COMPREHENSIVE METABOLIC PANEL
ALT: 17 IU/L (ref 0–44)
AST: 17 IU/L (ref 0–40)
Albumin/Globulin Ratio: 1.8 (ref 1.2–2.2)
Albumin: 4.2 g/dL (ref 3.8–4.9)
Alkaline Phosphatase: 68 IU/L (ref 39–117)
BUN/Creatinine Ratio: 19 (ref 9–20)
BUN: 14 mg/dL (ref 6–24)
Bilirubin Total: <0.2 mg/dL (ref 0.0–1.2)
CO2: 24 mmol/L (ref 20–29)
Calcium: 8.9 mg/dL (ref 8.7–10.2)
Chloride: 97 mmol/L (ref 96–106)
Creatinine, Ser: 0.72 mg/dL — ABNORMAL LOW (ref 0.76–1.27)
GFR calc Af Amer: 121 mL/min/{1.73_m2} (ref >59)
GFR calc non Af Amer: 105 mL/min/{1.73_m2} (ref >59)
Globulin, Total: 2.4 g/dL (ref 1.5–4.5)
Glucose: 87 mg/dL (ref 65–99)
Potassium: 4.1 mmol/L (ref 3.5–5.2)
Sodium: 138 mmol/L (ref 134–144)
Total Protein: 6.6 g/dL (ref 6.0–8.5)

## 2019-06-02 LAB — CBC WITH DIFFERENTIAL/PLATELET
Basophils Absolute: 0.1 10*3/uL (ref 0.0–0.2)
Basos: 1 %
EOS (ABSOLUTE): 0.3 10*3/uL (ref 0.0–0.4)
Eos: 6 %
Hematocrit: 42.9 % (ref 37.5–51.0)
Hemoglobin: 14.7 g/dL (ref 13.0–17.7)
Immature Grans (Abs): 0 10*3/uL (ref 0.0–0.1)
Immature Granulocytes: 0 %
Lymphocytes Absolute: 2 10*3/uL (ref 0.7–3.1)
Lymphs: 37 %
MCH: 32.2 pg (ref 26.6–33.0)
MCHC: 34.3 g/dL (ref 31.5–35.7)
MCV: 94 fL (ref 79–97)
Monocytes Absolute: 0.5 10*3/uL (ref 0.1–0.9)
Monocytes: 10 %
Neutrophils Absolute: 2.5 10*3/uL (ref 1.4–7.0)
Neutrophils: 46 %
Platelets: 189 10*3/uL (ref 150–450)
RBC: 4.57 x10E6/uL (ref 4.14–5.80)
RDW: 11.5 % — ABNORMAL LOW (ref 11.6–15.4)
WBC: 5.4 10*3/uL (ref 3.4–10.8)

## 2019-06-02 LAB — LIPID PANEL
Chol/HDL Ratio: 3.7 ratio (ref 0.0–5.0)
Cholesterol, Total: 168 mg/dL (ref 100–199)
HDL: 46 mg/dL (ref >39)
LDL Chol Calc (NIH): 81 mg/dL (ref 0–99)
Triglycerides: 247 mg/dL — ABNORMAL HIGH (ref 0–149)
VLDL Cholesterol Cal: 41 mg/dL — ABNORMAL HIGH (ref 5–40)

## 2019-06-02 LAB — HEMOGLOBIN A1C
Est. average glucose Bld gHb Est-mCnc: 103 mg/dL
Hgb A1c MFr Bld: 5.2 % (ref 4.8–5.6)

## 2019-06-02 LAB — T4, FREE: Free T4: 1.14 ng/dL (ref 0.82–1.77)

## 2019-06-02 LAB — VITAMIN D 25 HYDROXY (VIT D DEFICIENCY, FRACTURES): Vit D, 25-Hydroxy: 43.2 ng/mL (ref 30.0–100.0)

## 2019-06-02 LAB — TSH: TSH: 1.42 u[IU]/mL (ref 0.450–4.500)

## 2019-06-15 ENCOUNTER — Ambulatory Visit: Payer: BC Managed Care – PPO | Admitting: Family Medicine

## 2019-06-23 ENCOUNTER — Other Ambulatory Visit: Payer: Self-pay | Admitting: Family Medicine

## 2019-06-23 ENCOUNTER — Telehealth: Payer: Self-pay

## 2019-06-23 DIAGNOSIS — E039 Hypothyroidism, unspecified: Secondary | ICD-10-CM
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
## 2019-06-24 ENCOUNTER — Ambulatory Visit (INDEPENDENT_AMBULATORY_CARE_PROVIDER_SITE_OTHER): Payer: BC Managed Care – PPO | Admitting: Family Medicine

## 2019-06-24 ENCOUNTER — Other Ambulatory Visit: Payer: Self-pay

## 2019-06-24 ENCOUNTER — Encounter: Payer: Self-pay | Admitting: Family Medicine

## 2019-06-24 DIAGNOSIS — S0990XA Unspecified injury of head, initial encounter: Secondary | ICD-10-CM | POA: Insufficient documentation

## 2019-06-24 DIAGNOSIS — F1020 Alcohol dependence, uncomplicated: Secondary | ICD-10-CM
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
## 2019-06-25 NOTE — Telephone Encounter (Signed)
Pt called requesting a call back from [place], [place] plans to return to work today  Best contact: [phone]

## 2019-06-25 NOTE — Progress Notes (Signed)
Sent patient letter in [place].

## 2019-06-25 NOTE — Telephone Encounter (Signed)
Spoke with patient. He states he is feeling fine and would like to return to work today. Ok per a verbal from Dr. Tamala Julian. 06/25/2019

## 2019-06-28 ENCOUNTER — Encounter: Payer: Self-pay | Admitting: Family Medicine

## 2019-06-28 ENCOUNTER — Other Ambulatory Visit: Payer: Self-pay

## 2019-06-28 ENCOUNTER — Ambulatory Visit (INDEPENDENT_AMBULATORY_CARE_PROVIDER_SITE_OTHER): Payer: BC Managed Care – PPO | Admitting: Family Medicine

## 2019-06-28 VITALS — BP 102/66 | HR 66 | Ht 67.5 in | Wt 220.0 lb

## 2019-06-28 DIAGNOSIS — F4389 Other reactions to severe stress: Secondary | ICD-10-CM

## 2019-06-28 DIAGNOSIS — E669 Obesity, unspecified: Secondary | ICD-10-CM

## 2019-06-28 DIAGNOSIS — E781 Pure hyperglyceridemia: Secondary | ICD-10-CM

## 2019-06-28 DIAGNOSIS — R7303 Prediabetes: Secondary | ICD-10-CM

## 2019-06-28 DIAGNOSIS — E039 Hypothyroidism, unspecified: Secondary | ICD-10-CM

## 2019-06-28 DIAGNOSIS — F438 Other reactions to severe stress: Secondary | ICD-10-CM

## 2019-06-28 DIAGNOSIS — I10 Essential (primary) hypertension: Secondary | ICD-10-CM

## 2019-06-28 DIAGNOSIS — R5383 Other fatigue: Secondary | ICD-10-CM

## 2019-06-28 DIAGNOSIS — R42 Dizziness and giddiness: Secondary | ICD-10-CM | POA: Diagnosis not present

## 2019-06-28 DIAGNOSIS — R454 Irritability and anger: Secondary | ICD-10-CM

## 2019-06-28 DIAGNOSIS — Z634 Disappearance and death of family member: Secondary | ICD-10-CM

## 2019-06-28 DIAGNOSIS — E559 Vitamin D deficiency, unspecified: Secondary | ICD-10-CM

## 2019-06-28 MED ORDER — PROPRANOLOL HCL 20 MG PO TABS: 20.0000 mg | ORAL_TABLET | Freq: Four times a day (QID) | ORAL | 2 refills | Status: DC

## 2019-06-28 MED ORDER — PROPRANOLOL HCL 20 MG PO TABS: 20.0000 mg | ORAL_TABLET | Freq: Four times a day (QID) | ORAL | 0 refills | Status: DC

## 2019-06-28 MED ORDER — OMEGA-3-ACID ETHYL ESTERS 1 G PO CAPS: 2.0000 g | ORAL_CAPSULE | Freq: Two times a day (BID) | ORAL | 3 refills | Status: DC

## 2019-06-28 NOTE — Patient Instructions (Addendum)
Please call [facility] for appointment with doctor, as well as with therapist/counselor.   Please obtain propanolol from your cardiology team in the future.  I gave you a 30d supply so you wouldn't run out  Again please try to abstain from alcohol use and or no more than 1 drink daily.   Alcohol Intoxication Alcohol intoxication occurs when a person no longer thinks clearly or functions well (becomes impaired) after drinking alcohol. Intoxication can occur with just one drink. The legal definition of alcohol intoxication depends on the amount of alcohol in the blood (blood alcohol concentration, BAC). BAC of 80-100 mg/dL or higher is commonly considered legally intoxicated. The level of impairment depends on:  The amount of alcohol the person had.  The person's age, gender, and weight.  How often the person drinks.  Whether the person has other medical conditions, such as diabetes, seizures, or a heart condition. Alcohol intoxication can range from mild to severe. The condition can be dangerous, especially if the person:  Also took certain drugs or prescription medicines.  Drinks a large amount of alcohol in a short period of time (binge drinks). ? For women, binge drinking is having four or more drinks at one time. ? For men, binge drinking is having five or more drinks at one time. If you or anyone around you appears intoxicated, speak up and act. What are the causes? This condition is caused by drinking alcohol. What increases the risk? The following factors may make you more likely to develop this condition:  Peer pressure in young adults.  Difficulty managing stress.  History of drug or alcohol abuse.  Combining alcohol with drugs.  Family history of drug or alcohol abuse.  Low body weight.  Binge drinking. What are the signs or symptoms? Symptoms of alcohol intoxication can vary from person to person. Symptoms can be mild, moderate, or severe. Symptoms  of mild alcohol intoxication may include:  Feeling relaxed or sleepy.  Having mild difficulty with coordination, speech, memory, or attention. Symptoms of moderate alcohol intoxication may include:  Extreme emotions, like anger or sadness.  Moderate difficulty with coordination, speech, memory, or attention. Symptoms of severe alcohol intoxication may include:  Severe difficulty with coordination, speech, memory, or attention.  Passing out.  Vomiting.  Confusion.  Slow breathing.  Coma. Intoxication can change quickly from mild to severe. It can cause coma or death, especially in people who are not exposed to alcohol often. How is this diagnosed? Your health care provider will ask you how much alcohol you drank and what kind you had. Intoxication may also be diagnosed based on:  Your symptoms and medical history.  A physical exam.  A blood test that measures BAC.  A smell of alcohol on your breath. How is this treated? Treatment for alcohol intoxication may include:  Being monitored in an emergency department, hospital, or treatment center until your [facility] comes down and it is safe for you to go home.  IV fluids to prevent or treat loss of fluid in the body (dehydration).  Medicine to treat nausea or vomiting or to get rid of alcohol in the body.  Counseling (brief intervention) about the dangers of using alcohol.  Treatment for substance use disorder.  Oxygen therapy or a breathing machine (ventilator). Long-term (chronic) exposure to alcohol can have long-term effects on your brain, heart, and gastrointestinal system. These effects can be serious and may also require treatment. Follow these instructions at home:  Eating and drinking  Do not drink alcohol if: ? Your health care provider tells you not to drink. ? You are pregnant, may be pregnant, or are planning to become pregnant. ? You are under the legal drinking age (55 years old in the [place] ? You are  taking medicines that should not be taken with alcohol. ? You have a medical condition, and alcohol makes it worse. ? You need to drive or perform activities that require you to be alert. ? You have substance use disorder.  Ask your health care provider if alcohol is safe for you. If your health care provider allows you to drink alcohol, limit how much you have. You may drink: ? 0-1 drink a day for women. ? 0-2 drinks a day for men.  Be aware of how much alcohol is in your drink. In the [place] one drink equals one 12 oz bottle of beer (355 mL), one 5 oz glass of wine (148 mL), or one 1 oz shot of hard liquor (44 mL).  Avoid drinking alcohol on an empty stomach.  Stay hydrated. Drink enough fluid to keep your urine pale yellow. Avoid caffeine because it can dehydrate you.  Avoid drinking more than one drink per hour.  When having multiple drinks, drink water or a non-alcoholic beverage between alcoholic drinks. General instructions  Take over-the-counter and prescription medicines only as told by your health care provider.  Do not drive after drinking any amount of alcohol. Plan for a designated driver or another way to go home.  Have someone responsible stay with you while you are intoxicated. You should not be left alone.  Keep all follow-up visits as told by your health care provider. This is important. Contact a health care provider if:  You do not feel better after a few days.  You have problems at work, at school, or at home due to drinking. Get help right away if:  You have any of the following: ? Moderate to severe trouble with coordination, speech, memory, or attention. ? Trouble staying awake. ? Severe confusion. ? A seizure. ? Light-headedness. ? Fainting. ? Vomiting bright red blood or material that looks like coffee grounds. ? Bloody stool (feces). The blood may make your stool bright red, black, or tarry. It may also smell bad. ? Shakiness when trying to stop  drinking. ? Thoughts about hurting yourself or others. If you ever feel like you may hurt yourself or others, or have thoughts about taking your own life, get help right away. You can go to your nearest emergency department or call:  Your local emergency services (911 in the [place]  A suicide crisis helpline, such as the [facility] at 530-332-6086. This is open 24 hours a day. Summary  Alcohol intoxication occurs when a person no longer thinks clearly or functions well after drinking alcohol.  If your health care provider says that alcohol is safe for you, limit alcohol intake to no more than 1 drink a day for women (no drinks if you are pregnant) and 2 drinks a day for men. One drink equals 12 oz of beer, 5 oz of wine, or 1 oz of hard liquor.  Contact your health care provider if drinking has caused you problems at work, school, or home.  Get help right away if you have thoughts about hurting yourself or others. This information is not intended to replace advice given to you by your health care provider. Make sure you discuss any questions you have with your  health care provider. Document Released: 06/26/2005 Document Revised: 01/06/2018 Document Reviewed: 01/06/2018 Elsevier Patient Education  2020 [employer].

## 2019-06-28 NOTE — Progress Notes (Signed)
Assessment and plan:  1. Orthostatic lightheadedness   2. HTN (hypertension), malignant   3. Prediabetes   4. Obesity, Class II, BMI 35-39.9, isolated   5. Hypothyroidism, unspecified type   6. Hypertriglyceridemia   7. Fatigue, unspecified type   8. Vitamin D deficiency   9. Stress-related physiological response affecting physical condition   10. Irritability and anger   11. Death of family member     - Reviewed recent lab work (06/01/2019) in depth with patient today.  All lab work within normal limits unless otherwise noted.  Extensive education provided and all questions answered.  A1c - History of Prediabetes - 5.2 last check, down from 5.9 one year prior. - Will continue to monitor. - Encouraged ongoing prudent health habits.  Orthostatic Lightheadedness - Now resolved. - Will continue to monitor.  Hypertension, Malignant - Followed by [facility] as well -Last seen in March 2021 patient's blood pressure was very poorly controlled and was having an incidence of malignant hypertension. -He remains on propanolol 4 times daily.  Seen by cardiology for heart palpitations and they advise repeat Holter monitor 1 year.  Also patient was told to follow-up sooner if problems or concerns. - BP stable at home on current management. - Continue treatment plan as established.  See med list below. - Confirms he continues on propanolol, cozaar, HCTZ, and amlodipine. - Discussed potential side-effects of use of medication such as propanolol 4 times per day. - Patient tolerating meds well without complication.  Denies S-E.  - Lifestyle changes such as dash diet and engaging in a regular exercise program discussed with patient.  Educational handouts provided.  - Ambulatory BP monitoring encouraged. Keep log and bring in next OV. - Encouraged patient to get an app that shows his home BP averages.  - Will continue to monitor. -  Advised patient to continue to follow up with cardiology as established.  Hypertriglyceridemia - Triglycerides = 247, up from 91 prior. - HDL = 46, down from 70 prior. - LDL = 81 last check.  - Advised patient to resume fish oil as prescribed.   - [place] discussed and provided today.  See med list. - Patient tolerating meds well without S-E.  - Ongoing prudent dietary changes such as low saturated & trans fat and low carb diets discussed with patient.  Encouraged regular exercise and weight loss when appropriate.   Educational handouts provided at patient's desire.  - Will continue to monitor.  Stress-Related Physiological Response, Irritability, Anger - Given increased stress lately, advised patient to follow up with Behavioral Health and see his therapist and psychiatrist again. Discussed need for patient to take initiative to call and schedule his appointment.  - Continue treatment plan as established.  See med list. - Patient tolerating meds well without S-E.  - In addition to counseling/therapy and prescription intervention, reviewed the "spokes of the wheel" of mood and health management.  Stressed the importance of ongoing prudent habits, including regular exercise, appropriate sleep hygiene, healthful dietary habits, and prayer/meditation to relax.  - Will continue to monitor.  Vitamin D Deficiency - 43.2 last check, down from 54.6 prior. - Encouraged patient to continue supplementation as prescribed. - Will continue to monitor.  BMI Counseling - Body mass index is 33.95 kg/m Explained to patient what BMI refers to, and what it means medically.    Told patient to think about it as a "medical risk stratification measurement" and how increasing BMI is associated with  increasing risk/ or worsening state of various diseases such as hypertension, hyperlipidemia, diabetes, premature OA, depression etc.  American Heart Association guidelines for healthy diet, basically  Mediterranean diet, and exercise guidelines of 30 minutes 5 days per week or more discussed in detail.  Health counseling performed.  All questions answered.  Lifestyle & Preventative Health Maintenance - Advised patient to continue working toward exercising to improve overall mental, physical, and emotional health.    - Advised patient to stop drinking so much alcohol that he falls and doesn't remember.  - Encouraged patient to engage in daily physical activity, especially a formal exercise routine.  Recommended that the patient eventually strive for at least 150 minutes of moderate cardiovascular activity per week according to guidelines established by the [facility].   - Healthy dietary habits encouraged, including low-carb, and high amounts of lean protein in diet.   - Patient should also consume adequate amounts of water.   Education and routine counseling performed. Handouts provided.  Meds ordered this encounter  Medications  . propranolol (INDERAL) 20 MG tablet    Sig: Take 1 tablet (20 mg total) by mouth 4 (four) times daily. Obtain from [facility] in future    Dispense:  120 tablet    Refill:  0    Medications Discontinued During This Encounter  Medication Reason  . propranolol (INDERAL) 20 MG tablet Reorder     Return for Follow-up psychiatry and psychology near future, follow-up me 4 months.   Anticipatory guidance and routine counseling done re: condition, txmnt options and need for follow up. All questions of patient's were answered.   Gross side effects, risk and benefits, and alternatives of medications discussed with patient.  Patient is aware that all medications have potential side effects and we are unable to predict every sideeffect or drug-drug interaction that may occur.  Expresses verbal understanding and consents to current therapy plan and treatment regiment.  Please see AVS handed out to patient at the end of our visit for additional patient instructions/  counseling done pertaining to today's office visit.  Note:  This document was prepared using Dragon voice recognition software and may include unintentional dictation errors.  This document serves as a record of services personally performed by [name], DO. It was created on her behalf by Toni Amend, a trained medical scribe. The creation of this record is based on the scribe's personal observations and the provider's statements to them.   I have reviewed the above medical documentation for accuracy and completeness and I concur.  [name], DO 06/28/2019 11:13 AM      ----------------------------------------------------------------------------------------------------------------------  Subjective:   CC:   Ralph Mack is a 53 y.o. male who presents to [place] at [facility] today for review and discussion of recent bloodwork that was done in addition to f/up on chronic conditions we are managing for pt.  1. All recent blood work that we ordered was reviewed with patient today.  Patient was counseled on all abnormalities and we discussed dietary and lifestyle changes that could help those values (also medications when appropriate).  Extensive health counseling performed and all patient's concerns/ questions were addressed.  See labs below and also plan for more details of these abnormalities  GI put him on Bentyl for his IBS; notes this is working well.  States he has been drinking a lot of Gatorade lately.  - Mood Management He has stopped going to behavioral health.  Is no longer talking to therapist or  psychiatrist.  Says his family thinks he's depressed because he has "organizational issues."  States it was the 6 month anniversary of his mother's death recently.  Has been experiencing some family concerns and notes "everything changed within three days."  - [place] today Notes he's had a couple of incidents where he's fallen lately. Says  he was drinking at one point and doesn't remember when he fell.  "I didn't think I did enough drinking to do that."  States he took his sleeping aid after alcohol and "that probably did it."  Overall feeling good.  1. HTN HPI:  -  His blood pressure has been controlled at home.  Pt is checking it at home.  118/81, 143/78, 121/75, 145/83, 126/81, 133/76, 130/82, all pulses range from 59-76.  Average in 120's/130's.  - Patient reports good compliance with blood pressure medications. Notes he does continue to see cardiology.  Denies episodes of dizziness.  States "it stopped happening after I talked to you about it."  - Denies medication S-E   - Smoking Status noted   - He denies new onset of: chest pain, exercise intolerance, shortness of breath, dizziness, visual changes, headache, lower extremity swelling or claudication.   Last 3 blood pressure readings in our office are as follows: BP Readings from Last 3 Encounters:  06/28/19 102/66  05/25/19 116/82  04/09/19 117/76    Filed Weights   06/28/19 0958  Weight: 220 lb (99.8 kg)    2. 53 y.o. male here for cholesterol follow-up.   - Notes he hasn't been taking his fish oil regularly.  - Denies medication S-E  - Smoking Status noted   - He denies new onset of: chest pain, exercise intolerance, shortness of breath, dizziness, visual changes, headache, lower extremity swelling or claudication.   The cholesterol last visit was:  Lab Results  Component Value Date   CHOL 168 06/01/2019   HDL 46 06/01/2019   LDLCALC 132 (H) 08/17/2018   TRIG 247 (H) 06/01/2019   CHOLHDL 3.7 06/01/2019    Hepatic Function Latest Ref Rng & Units 06/01/2019 08/17/2018 10/06/2017  Total Protein 6.0 - 8.5 g/dL 6.6 6.9 6.8  Albumin 3.8 - 4.9 g/dL 4.2 4.6 4.5  AST 0 - 40 IU/L _0 ALT 0 - 44 IU/L _1 Alk Phosphatase 39 - 117 IU/L 68 58 60  Total Bilirubin 0.0 - 1.2 mg/dL <0.2 0.5 0.2        Wt Readings from Last 3 Encounters:   06/28/19 220 lb (99.8 kg)  05/25/19 218 lb (98.9 kg)  04/09/19 215 lb (97.5 kg)   BP Readings from Last 3 Encounters:  06/28/19 102/66  05/25/19 116/82  04/09/19 117/76   Pulse Readings from Last 3 Encounters:  06/28/19 66  05/25/19 74  04/09/19 (!) 57   BMI Readings from Last 3 Encounters:  06/28/19 33.95 kg/m  05/25/19 33.64 kg/m  04/09/19 33.18 kg/m     Patient Care Team    Relationship Specialty Notifications Start End  [name], DO PCP - General Family Medicine  09/26/16   Melida Quitter, MD Consulting Physician Otolaryngology  09/26/16   Kennith Gain, MD Consulting Physician Allergy  09/26/16    Comment: [address] [place].   Gatha Mayer, MD Consulting Physician Gastroenterology  11/21/16   Purnell Shoemaker., MD Attending Physician Psychiatry  05/25/19     Full medical history updated and reviewed in the office today  Patient Active Problem  List   Diagnosis Date Noted  . Head injury 06/24/2019  . ?able Irritable bowel syndrome with primarily diarrhea 05/25/2019  . Orthostatic lightheadedness 05/25/2019  . Lymphocytic colitis 04/19/2019  . Hyperlipidemia 01/06/2019  . Mood disorder ([place]) 10/05/2018  . Dyslipidemia 09/11/2018  . Essential hypertension 09/11/2018  . Chest pain 09/11/2018  . HTN (hypertension), malignant 09/02/2018  . Stress-related physiological response affecting physical condition 2018/08/29  . Death of family member 08/29/2018  . Sleeping difficulty 08/17/2018  . Acute reaction to situational stress 08/17/2018  . Irritability and anger 08/17/2018  . Low testosterone in male 08/17/2018  . Elevated blood pressure reading- w/o dx HTN 10/14/2017  . White coat syndrome without diagnosis of hypertension 10/14/2017  . Hx of colonic polyp 11/13/2016  . Hypothyroidism 10/24/2016  . Urinary frequency 10/24/2016  . Fatigue 10/24/2016  . OAB (overactive bladder) 10/24/2016  . Low libido 09/26/2016  . Environmental and  seasonal allergies 09/26/2016  . Acute alcohol overingestion, uncomplicated ([place])- fell recently 09/26/2016  . History of substance abuse ([place]) 09/26/2016  . Hematochezia 09/26/2016  . Family hx of colon cancer requiring screening colonoscopy 09/26/2016  . Prediabetes 09/05/2015  . Vitamin D deficiency 09/05/2015  . Hypertriglyceridemia 09/05/2015  . Gout 07/26/2015  . Obesity, Class II, BMI 35-39.9, isolated 07/26/2015  . Smoker 07/26/2015  . Recurrent sinusitis 07/26/2015  . Cervical disc disease 07/26/2015    Past Medical History:  Diagnosis Date  . Allergy   . Arthritis   . Hx of colonic polyp 11/13/2016   10/2016 7 mm ssp/a - recall 2023  . Hypothyroidism   . Lymphocytic colitis 04/19/2019  . Prediabetes   . Recurrent upper respiratory infection (URI)     Past Surgical History:  Procedure Laterality Date  . COLONOSCOPY  2018  . CORONARY ANGIOGRAM  2009    Social History   Tobacco Use  . Smoking status: Light Tobacco Smoker    Types: Cigarettes  . Smokeless tobacco: Never Used  Substance Use Topics  . Alcohol use: Yes    Alcohol/week: 12.0 standard drinks    Types: 12 Cans of beer per week    Family Hx: Family History  Problem Relation Age of Onset  . Asthma Mother   . COPD Mother   . Cancer Mother        Skin  . Cancer - Other Mother        bone, liver, breast, lung  . Emphysema Mother   . Heart disease Father        Pacemaker  . Parkinson's disease Father   . Depression Father   . Depression Daughter   . OCD Daughter   . Alcohol abuse Maternal Grandmother   . Schizophrenia Maternal Grandmother   . Diabetes Paternal Grandfather   . Kidney disease Paternal Grandfather   . Colon polyps Neg Hx   . Colon cancer Neg Hx   . Esophageal cancer Neg Hx   . Stomach cancer Neg Hx   . Rectal cancer Neg Hx      Medications: Current Outpatient Medications  Medication Sig Dispense Refill  . allopurinol (ZYLOPRIM) 300 MG tablet Take 1 tablet (300 mg total)  by mouth daily. 90 tablet 0  . amLODipine (NORVASC) 5 MG tablet Take 1 tablet by mouth once daily 90 tablet 4  . Cholecalciferol (VITAMIN D3) 5000 units TABS 5,000 IU OTC vitamin D3 daily. 90 tablet 3  . Coenzyme Q10 (CO Q 10) 100 MG CAPS Take 3 capsules by mouth daily.    Marland Kitchen  dicyclomine (BENTYL) 20 MG tablet Take 1 tablet (20 mg total) by mouth 4 (four) times daily -  before meals and at bedtime. As needed 360 tablet 1  . fexofenadine (ALLEGRA) 180 MG tablet Take 180 mg by mouth daily.    . fluticasone (FLONASE) 50 MCG/ACT nasal spray Place 2 sprays into the nose daily.    . hydrochlorothiazide (MICROZIDE) 12.5 MG capsule Take 1 capsule by mouth once daily 90 capsule 0  . hydrocortisone (ANUSOL-HC) 2.5 % rectal cream Place 1 application rectally 2 (two) times daily. 30 g 0  . levothyroxine (SYNTHROID) 50 MCG tablet Take 1 tablet by mouth once daily 90 tablet 0  . losartan (COZAAR) 50 MG tablet Take 1 tablet by mouth once daily 90 tablet 0  . Multiple Vitamins-Minerals (SUPER MEGA VITE 75/BETA CARO PO) Take by mouth.    . Omega-3 Fatty Acids (FISH OIL) 1200 MG CAPS 2 p.o. twice daily    . ondansetron (ZOFRAN) 4 MG tablet Take 1 tablet (4 mg total) by mouth every 8 (eight) hours as needed for nausea or vomiting. 20 tablet 0  . Potassium 99 MG TABS Take 3-4 tablets by mouth daily.    . propranolol (INDERAL) 20 MG tablet Take 1 tablet (20 mg total) by mouth 4 (four) times daily. Obtain from [facility] in future 120 tablet 0  . sertraline (ZOLOFT) 100 MG tablet Take 2 (200 mg) daily 180 tablet 1  . Zinc 50 MG CAPS Take 1 capsule by mouth daily.     No current facility-administered medications for this visit.     Allergies:  No Known Allergies   Review of Systems: General:   No F/C, wt loss Pulm:   No DIB, SOB, pleuritic chest pain Card:  No CP, palpitations Abd:  No n/v/d or pain Ext:  No inc edema from baseline  Objective:  Blood pressure 102/66, pulse 66, height 5' 7.5" (1.715 m), weight  220 lb (99.8 kg), SpO2 93 %. Body mass index is 33.95 kg/m. Gen:   Black eye on the left, and a bruise/abrasion over that area.  Well NAD, A and O *3 HEENT:    [place]/AT, EOMI,  MMM Lungs:   Normal work of breathing. CTA B/L, no Wh, rhonchi Heart:   RRR, S1, S2 WNL's, no MRG Abd:   No gross distention Exts:    warm, pink,  Brisk capillary refill, warm and well perfused.  Psych:    No HI/SI, judgement and insight good, Euthymic mood. Full Affect.   Recent Results (from the past 2160 hour(s))  TSH     Status: None   Collection Time: 06/01/19 10:18 AM  Result Value Ref Range   TSH 1.420 0.450 - 4.500 uIU/mL  VITAMIN D 25 Hydroxy (Vit-D Deficiency, Fractures)     Status: None   Collection Time: 06/01/19 10:18 AM  Result Value Ref Range   Vit D, 25-Hydroxy 43.2 30.0 - 100.0 ng/mL    Comment: Vitamin D deficiency has been defined by the [place] practice guideline as a level of serum 25-OH vitamin D less than 20 ng/mL (1,2). The Endocrine Society went on to further define vitamin D insufficiency as a level between 21 and 29 ng/mL (2). 1. IOM (Institute of Medicine). 2010. Dietary reference    intakes for calcium and D. [place]: The    [employer]. 2. Holick MF, Binkley [place], Bischoff-Ferrari HA, et al.    Evaluation, treatment, and prevention of vitamin  D    deficiency: an Endocrine Society clinical practice    guideline. JCEM. 2011 Jul; 96(7):1911-30.   Lipid panel     Status: Abnormal   Collection Time: 06/01/19 10:18 AM  Result Value Ref Range   Cholesterol, Total 168 100 - 199 mg/dL   Triglycerides 247 (H) 0 - 149 mg/dL   HDL 46 >39 mg/dL   VLDL Cholesterol Cal 41 (H) 5 - 40 mg/dL   LDL Chol Calc (NIH) 81 0 - 99 mg/dL   Lipid Comment: CANCELED     Comment: Test not performed  Result canceled by the ancillary.    Chol/HDL Ratio 3.7 0.0 - 5.0 ratio    Comment:                                   T. Chol/HDL Ratio                                              Men  Women                               1/2 Avg.Risk  3.4    3.3                                   Avg.Risk  5.0    4.4                                2X Avg.Risk  9.6    7.1                                3X Avg.Risk 23.4   11.0   T4, free     Status: None   Collection Time: 06/01/19 10:18 AM  Result Value Ref Range   Free T4 1.14 0.82 - 1.77 ng/dL  Hemoglobin A1c     Status: None   Collection Time: 06/01/19 10:18 AM  Result Value Ref Range   Hgb A1c MFr Bld 5.2 4.8 - 5.6 %    Comment:          Prediabetes: 5.7 - 6.4          Diabetes: >6.4          Glycemic control for adults with diabetes: <7.0    Est. average glucose Bld gHb Est-mCnc 103 mg/dL  Comprehensive metabolic panel     Status: Abnormal   Collection Time: 06/01/19 10:18 AM  Result Value Ref Range   Glucose 87 65 - 99 mg/dL   BUN 14 6 - 24 mg/dL   Creatinine, Ser 0.72 (L) 0.76 - 1.27 mg/dL   GFR calc non Af Amer 105 >59 mL/min/1.73   GFR calc Af Amer 121 >59 mL/min/1.73   BUN/Creatinine Ratio 19 9 - 20   Sodium 138 134 - 144 mmol/L   Potassium 4.1 3.5 - 5.2 mmol/L   Chloride 97 96 - 106 mmol/L   CO2 24 20 - 29 mmol/L   Calcium 8.9 8.7 - 10.2 mg/dL   Total Protein 6.6 6.0 - 8.5  g/dL   Albumin 4.2 3.8 - 4.9 g/dL   Globulin, Total 2.4 1.5 - 4.5 g/dL   Albumin/Globulin Ratio 1.8 1.2 - 2.2   Bilirubin Total <0.2 0.0 - 1.2 mg/dL   Alkaline Phosphatase 68 39 - 117 IU/L   AST 17 0 - 40 IU/L   ALT 17 0 - 44 IU/L  CBC with Differential/Platelet     Status: Abnormal   Collection Time: 06/01/19 10:18 AM  Result Value Ref Range   WBC 5.4 3.4 - 10.8 x10E3/uL   RBC 4.57 4.14 - 5.80 x10E6/uL   Hemoglobin 14.7 13.0 - 17.7 g/dL   Hematocrit 42.9 37.5 - 51.0 %   MCV 94 79 - 97 fL   MCH 32.2 26.6 - 33.0 pg   MCHC 34.3 31.5 - 35.7 g/dL   RDW 11.5 (L) 11.6 - 15.4 %   Platelets 189 150 - 450 x10E3/uL   Neutrophils 46 Not Estab. %   Lymphs 37 Not Estab. %   Monocytes 10 Not Estab. %   Eos 6  Not Estab. %   Basos 1 Not Estab. %   Neutrophils Absolute 2.5 1.4 - 7.0 x10E3/uL   Lymphocytes Absolute 2.0 0.7 - 3.1 x10E3/uL   Monocytes Absolute 0.5 0.1 - 0.9 x10E3/uL   EOS (ABSOLUTE) 0.3 0.0 - 0.4 x10E3/uL   Basophils Absolute 0.1 0.0 - 0.2 x10E3/uL   Immature Granulocytes 0 Not Estab. %   Immature Grans (Abs) 0.0 0.0 - 0.1 x10E3/uL

## 2019-07-28 ENCOUNTER — Other Ambulatory Visit: Payer: Self-pay | Admitting: Psychiatry

## 2019-07-28 DIAGNOSIS — F411 Generalized anxiety disorder: Secondary | ICD-10-CM

## 2019-07-28 DIAGNOSIS — F32A Depression, unspecified: Secondary | ICD-10-CM

## 2019-07-28 DIAGNOSIS — F410 Panic disorder [episodic paroxysmal anxiety] without agoraphobia: Secondary | ICD-10-CM

## 2019-07-28 DIAGNOSIS — F329 Major depressive disorder, single episode, unspecified: Secondary | ICD-10-CM

## 2019-07-28 NOTE — Telephone Encounter (Signed)
Last appt 11/2018, when due back?

## 2019-08-25 ENCOUNTER — Other Ambulatory Visit: Payer: Self-pay | Admitting: Family Medicine

## 2019-08-25 DIAGNOSIS — I10 Essential (primary) hypertension: Secondary | ICD-10-CM

## 2019-09-01 ENCOUNTER — Other Ambulatory Visit: Payer: Self-pay | Admitting: Family Medicine

## 2019-09-01 DIAGNOSIS — I10 Essential (primary) hypertension: Secondary | ICD-10-CM

## 2019-09-20 ENCOUNTER — Other Ambulatory Visit: Payer: Self-pay | Admitting: Family Medicine

## 2019-09-20 DIAGNOSIS — M1000 Idiopathic gout, unspecified site: Secondary | ICD-10-CM

## 2019-10-25 ENCOUNTER — Ambulatory Visit (INDEPENDENT_AMBULATORY_CARE_PROVIDER_SITE_OTHER): Payer: BC Managed Care – PPO | Admitting: Family Medicine

## 2019-10-25 ENCOUNTER — Encounter: Payer: Self-pay | Admitting: Family Medicine

## 2019-10-25 ENCOUNTER — Other Ambulatory Visit: Payer: Self-pay

## 2019-10-25 VITALS — BP 123/83 | HR 57 | Ht 67.5 in | Wt 225.0 lb

## 2019-10-25 DIAGNOSIS — E669 Obesity, unspecified: Secondary | ICD-10-CM | POA: Diagnosis not present

## 2019-10-25 DIAGNOSIS — I10 Essential (primary) hypertension: Secondary | ICD-10-CM

## 2019-10-25 DIAGNOSIS — E785 Hyperlipidemia, unspecified: Secondary | ICD-10-CM | POA: Diagnosis not present

## 2019-10-25 DIAGNOSIS — R635 Abnormal weight gain: Secondary | ICD-10-CM | POA: Insufficient documentation

## 2019-10-25 DIAGNOSIS — F39 Unspecified mood [affective] disorder: Secondary | ICD-10-CM | POA: Diagnosis not present

## 2019-10-25 DIAGNOSIS — R7303 Prediabetes: Secondary | ICD-10-CM

## 2019-10-25 DIAGNOSIS — E781 Pure hyperglyceridemia: Secondary | ICD-10-CM

## 2019-10-25 NOTE — Progress Notes (Signed)
Telehealth office visit note for Ralph Mack, D.O- at Primary Care at [facility]   I connected with current patient today and verified that I am speaking with the correct person using two identifiers.   . Location of the patient: Home . Location of the provider: Office Only the patient (+/- their family members at pt's discretion) and myself were participating in the encounter - This visit type was conducted due to national recommendations for restrictions regarding the COVID-19 Pandemic (e.g. social distancing) in an effort to limit this patient's exposure and mitigate transmission in our community.  This format is felt to be most appropriate for this patient at this time.   - No physical exam could be performed with this format, beyond that communicated to [place] by the patient/ family members as noted.   - Additionally my office staff/ schedulers discussed with the patient that there may be a monetary charge related to this service, depending on their medical insurance.   The patient expressed understanding, and agreed to proceed.       History of Present Illness: No chief complaint on file.   Ralph Mack, am serving as scribe for Dr. Mellody Mack.  Notes he has been good.  He has not followed up with psychiatry or psychology.  Notes "I'm not having any downward issues; I'm not staring out the window thinking life is gloomy and doomy."  Notes he's been distracted by looking at houses to buy.  Notes this has been a lot, stressful; his mother-in-law and wife's sister are coming to live here as well.  Started eating what he wanted again about 2-3 months ago.  Notes "eating things like mac n cheese at ten o'clock at night."  Thinks he's been emotional eating.  - Gouty Arthropathy Denies concerns with gout.  Well controlled on allopurinol.  - Exercising Notes he does a lot of walking at work, but no exercise outside of work.  HPI:  Hypertension:  -  His blood  pressure at home has been running: WNL after he's been seated for a while.  Denies episodes of lightheadedness or dizziness.  - Patient reports good compliance with medication and/or lifestyle modification  - His denies acute concerns or problems related to treatment plan  - He denies new onset of: chest pain, exercise intolerance, shortness of breath, dizziness, visual changes, headache, lower extremity swelling or claudication.   Last 3 blood pressure readings in our office are as follows: BP Readings from Last 3 Encounters:  10/25/19 123/83  06/28/19 102/66  05/25/19 116/82   Filed Weights   10/25/19 1027  Weight: 225 lb (102.1 kg)   HPI:  Hyperlipidemia:  54 y.o. male here for cholesterol follow-up.   - Patient reports good compliance with treatment plan of:  medication and/ or lifestyle management.    - Patient denies any acute concerns or problems with management plan   He doesn't take his fish oil on a daily basis, but he tries to take his Lovaza as recommended.  - He denies new onset of: myalgias, arthralgias, increased fatigue more than normal, chest pains, exercise intolerance, shortness of breath, dizziness, visual changes, headache, lower extremity swelling or claudication.   Most recent cholesterol panel was:  Lab Results  Component Value Date   CHOL 168 06/01/2019   HDL 46 06/01/2019   LDLCALC 81 06/01/2019   TRIG 247 (H) 06/01/2019   CHOLHDL 3.7 06/01/2019   Hepatic Function Latest Ref Rng & Units  06/01/2019 08/17/2018 10/06/2017  Total Protein 6.0 - 8.5 g/dL 6.6 6.9 6.8  Albumin 3.8 - 4.9 g/dL 4.2 4.6 4.5  AST 0 - 40 IU/L _0 ALT 0 - 44 IU/L _1 Alk Phosphatase 39 - 117 IU/L 68 58 60  Total Bilirubin 0.0 - 1.2 mg/dL <0.2 0.5 0.2        GAD 7 : Generalized Anxiety Score 03/22/2019 01/06/2019 10/05/2018 08/25/2018  Nervous, Anxious, on Edge 0 0 2 2  Control/stop worrying 0 0 2 2  Worry too much - different things 0 0 2 2  Trouble relaxing 0 0  2 2  Restless 0 0 2 2  Easily annoyed or irritable 0 _2 Afraid - awful might happen 0 0 2 2  Total GAD 7 Score 0 _3 Anxiety Difficulty Not difficult at all - Very difficult Somewhat difficult    Depression screen [facility] 2/9 10/25/2019 06/28/2019 05/25/2019 03/22/2019 01/06/2019  Decreased Interest 0 0 0 0 0  Down, Depressed, Hopeless 0 0 0 0 2  PHQ - 2 Score 0 0 0 0 2  Altered sleeping 0 0 0 0 0  Tired, decreased energy 0 0 1 0 0  Change in appetite 0 0 0 0 0  Feeling bad or failure about yourself  0 0 0 0 0  Trouble concentrating 0 0 0 0 0  Moving slowly or fidgety/restless 0 0 0 0 0  Suicidal thoughts 0 0 0 0 0  PHQ-9 Score 0 0 1 0 2  Difficult doing work/chores - Not difficult at all Not difficult at all Not difficult at all -  Some recent data might be hidden      Impression and Recommendations:    1. Mood disorder (HCC)   2. Obesity, Class II, BMI 35-39.9, isolated   3. recent weight gain- poor habits   4. HTN (hypertension), malignant   5. Hyperlipidemia, unspecified hyperlipidemia type   6. Hypertriglyceridemia   7. Prediabetes      Mood Disorder - Per patient, mood is stable at this time. - Encouraged patient to visit psychology and psychiatry as advised. - Continue management as established.  - Along with prescription intervention and therapy/counseling, reviewed the "spokes of the wheel" of mood and health management.  Stressed the importance of ongoing prudent habits, including regular exercise, appropriate sleep hygiene, healthful dietary habits, and prayer/meditation to relax.  - We will continue to monitor.  Hypertension, Malignant - Blood pressure currently is at goal. - Patient will continue current treatment regimen.  See med list.  - Encouraged patient to continue to keep in touch with cardiology and follow-up with them as recommended.  - Counseled patient on pathophysiology of disease and discussed various treatment options, which always  includes dietary and lifestyle modification as first line.   - Lifestyle changes such as dash and heart healthy diets and engaging in a regular exercise program discussed extensively with patient.   - Ambulatory blood pressure monitoring encouraged at least 3 times weekly.  Keep log and bring in every office visit.  Reminded patient that if they ever feel poorly in any way, to check their blood pressure and pulse.  - We will continue to monitor.  Hyperlipidemia, Hypertriglyceridemia - Cholesterol levels last checked four months ago. - Pt will continue current treatment regimen. - Advised patient to continue Lovaza as prescribed.  - Dietary changes such as low saturated & trans fat diets for hyperlipidemia  and low carb diets for hypertriglyceridemia discussed with patient.    - Encouraged patient to follow AHA guidelines for regular exercise and also engage in weight loss if BMI above 25.   - We will continue to monitor and re-check as discussed.   BMI Counseling - Body mass index is 34.72 kg/m Explained to patient what BMI refers to, and what it means medically.    Told patient to think about it as a "medical risk stratification measurement" and how increasing BMI is associated with increasing risk/ or worsening state of various diseases such as hypertension, hyperlipidemia, diabetes, premature OA, depression etc.  American Heart Association guidelines for healthy diet, basically Mediterranean diet, and exercise guidelines of 30 minutes 5 days per week or more discussed in detail.  Health counseling performed.  All questions answered.  - Goal is to be 200 lbs in 3 months.  Lifestyle & Preventative Health Maintenance - Advised patient to continue working toward exercising to improve overall mental, physical, and emotional health.    - Encouraged patient to engage in daily physical activity as tolerated, especially a formal exercise routine.  Recommended that the patient eventually  strive for at least 150 minutes of moderate cardiovascular activity per week according to guidelines established by the [facility].   - Healthy dietary habits encouraged, including low-carb, and high amounts of lean protein in diet.   - Patient should also consume adequate amounts of water  Recommendations - Return for FLP 3 months with tele-health OV three days later. - Weight loss goal, lose down to 200 lbs before next OV.    - As part of my medical decision making, I reviewed the following data within the [place] History obtained from pt /family, CMA notes reviewed and incorporated if applicable, Labs reviewed, Radiograph/ tests reviewed if applicable and OV notes from prior OV's with me, as well as other specialists she/he has seen since seeing me last, were all reviewed and used in my medical decision making process today.    - Additionally, discussion had with patient regarding our treatment plan, and their biases/concerns about that plan were used in my medical decision making today.    - The patient agreed with the plan and demonstrated an understanding of the instructions.   No barriers to understanding were identified.    - Red flag symptoms and signs discussed in detail.  Patient expressed understanding regarding what to do in case of emergency\ urgent symptoms.   - The patient was advised to call back or seek an in-person evaluation if the symptoms worsen or if the condition fails to improve as anticipated.   Return for FLp, CMp in 3 mo with telehealth ov 3 d later- chol, Bp, goal down to 200lbs.    Orders Placed This Encounter  Procedures  . Comprehensive metabolic panel  . Lipid panel    I provided 16 minutes of non face-to-face time during this encounter.  Additional time was spent with charting and coordination of care before and after the actual visit commenced.   Note:  This note was prepared with assistance of Dragon voice recognition software.  Occasional wrong-word or sound-a-like substitutions may have occurred due to the inherent limitations of voice recognition software.  This document serves as a record of services personally performed by Ralph Dance, DO. It was created on her behalf by Toni Amend, a trained medical scribe. The creation of this record is based on the scribe's personal observations and the provider's statements to  them.   This case required medical decision making of at least moderate complexity. The above documentation has been reviewed to be accurate and was completed by Marjory Sneddon, D.O.     Patient Care Team    Relationship Specialty Notifications Start End  Ralph Dance, DO PCP - General Family Medicine  09/26/16   Melida Quitter, MD Consulting Physician Otolaryngology  09/26/16   Kennith Gain, MD Consulting Physician Allergy  09/26/16    Comment: [address] [place].   Gatha Mayer, MD Consulting Physician Gastroenterology  11/21/16   Purnell Shoemaker., MD Attending Physician Psychiatry  05/25/19      -Vitals obtained; medications/ allergies reconciled;  personal medical, social, Sx etc.histories were updated by CMA, reviewed by me and are reflected in chart   Patient Active Problem List   Diagnosis Date Noted  . Hypothyroidism 10/24/2016  . OAB (overactive bladder) 10/24/2016  . Prediabetes 09/05/2015  . Hypertriglyceridemia 09/05/2015  . Obesity, Class II, BMI 35-39.9, isolated 07/26/2015  . Smoker 07/26/2015  . Fatigue 10/24/2016  . Acute alcohol overingestion, uncomplicated ([place])- fell recently 09/26/2016  . History of substance abuse ([place]) 09/26/2016  . Gout 07/26/2015  . Urinary frequency 10/24/2016  . Low libido 09/26/2016  . Environmental and seasonal allergies 09/26/2016  . Family hx of colon cancer requiring screening colonoscopy 09/26/2016  . Vitamin D deficiency 09/05/2015  . Rapid weight gain 10/25/2019  . Head injury 06/24/2019  . ?able  Irritable bowel syndrome with primarily diarrhea 05/25/2019  . Orthostatic lightheadedness 05/25/2019  . Lymphocytic colitis 04/19/2019  . Hyperlipidemia 01/06/2019  . Mood disorder ([place]) 10/05/2018  . Dyslipidemia 09/11/2018  . Essential hypertension 09/11/2018  . Chest pain 09/11/2018  . HTN (hypertension), malignant 09/02/2018  . Stress-related physiological response affecting physical condition 09-24-18  . Death of family member 2018-09-24  . Sleeping difficulty 08/17/2018  . Acute reaction to situational stress 08/17/2018  . Irritability and anger 08/17/2018  . Low testosterone in male 08/17/2018  . Elevated blood pressure reading- w/o dx HTN 10/14/2017  . White coat syndrome without diagnosis of hypertension 10/14/2017  . Hx of colonic polyp 11/13/2016  . Hematochezia 09/26/2016  . Recurrent sinusitis 07/26/2015  . Cervical disc disease 07/26/2015     Current Meds  Medication Sig  . allopurinol (ZYLOPRIM) 300 MG tablet Take 1 tablet by mouth once daily  . amLODipine (NORVASC) 5 MG tablet Take 1 tablet by mouth once daily  . Cholecalciferol (VITAMIN D3) 5000 units TABS 5,000 IU OTC vitamin D3 daily.  . Coenzyme Q10 (CO Q 10) 100 MG CAPS Take 3 capsules by mouth daily.  Marland Kitchen dicyclomine (BENTYL) 20 MG tablet Take 1 tablet (20 mg total) by mouth 4 (four) times daily -  before meals and at bedtime. As needed  . fexofenadine (ALLEGRA) 180 MG tablet Take 180 mg by mouth daily.  . fluticasone (FLONASE) 50 MCG/ACT nasal spray Place 2 sprays into the nose daily.  . hydrochlorothiazide (MICROZIDE) 12.5 MG capsule Take 1 capsule by mouth once daily  . levothyroxine (SYNTHROID) 50 MCG tablet Take 1 tablet by mouth once daily  . losartan (COZAAR) 50 MG tablet Take 1 tablet by mouth once daily  . Multiple Vitamins-Minerals (SUPER MEGA VITE 75/BETA CARO PO) Take by mouth.  . omega-3 acid ethyl esters (LOVAZA) 1 g capsule Take 2 capsules (2 g total) by mouth 2 (two) times daily.  .  Potassium 99 MG TABS Take 3-4 tablets by mouth daily.  . propranolol (  INDERAL) 20 MG tablet Take 1 tablet (20 mg total) by mouth 4 (four) times daily.  . sertraline (ZOLOFT) 100 MG tablet Take 2 tablets by mouth once daily  . Zinc 50 MG CAPS Take 1 capsule by mouth daily.     Allergies:  No Known Allergies   ROS:  See above HPI for pertinent positives and negatives   Objective:   Blood pressure 123/83, pulse (!) 57, height 5' 7.5" (1.715 m), weight 225 lb (102.1 kg).  (if some vitals are omitted, this means that patient was UNABLE to obtain them even though they were asked to get them prior to OV today.  They were asked to call us at their earliest convenience with these once obtained. )  General: A & O * 3; sounds in no acute distress; in usual state of health.  Skin: Pt confirms warm and dry extremities and pink fingertips HEENT: Pt confirms lips non-cyanotic Chest: Patient confirms normal chest excursion and movement Respiratory: speaking in full sentences, no conversational dyspnea; patient confirms no use of accessory muscles Psych: insight appears good, mood- appears full

## 2019-11-01 ENCOUNTER — Other Ambulatory Visit: Payer: Self-pay | Admitting: Psychiatry

## 2019-11-01 DIAGNOSIS — F329 Major depressive disorder, single episode, unspecified: Secondary | ICD-10-CM

## 2019-11-01 DIAGNOSIS — F411 Generalized anxiety disorder: Secondary | ICD-10-CM

## 2019-11-01 DIAGNOSIS — F410 Panic disorder [episodic paroxysmal anxiety] without agoraphobia: Secondary | ICD-10-CM

## 2019-11-01 DIAGNOSIS — F32A Depression, unspecified: Secondary | ICD-10-CM

## 2019-11-01 NOTE — Telephone Encounter (Signed)
Last apt 11/2018

## 2019-11-23 ENCOUNTER — Other Ambulatory Visit: Payer: Self-pay | Admitting: Psychiatry

## 2019-11-23 ENCOUNTER — Other Ambulatory Visit: Payer: Self-pay | Admitting: Adult Health

## 2019-11-23 ENCOUNTER — Other Ambulatory Visit: Payer: Self-pay | Admitting: Family Medicine

## 2019-11-23 DIAGNOSIS — I10 Essential (primary) hypertension: Secondary | ICD-10-CM

## 2019-11-23 DIAGNOSIS — F32A Depression, unspecified: Secondary | ICD-10-CM

## 2019-11-23 DIAGNOSIS — F411 Generalized anxiety disorder: Secondary | ICD-10-CM

## 2019-11-23 DIAGNOSIS — F329 Major depressive disorder, single episode, unspecified: Secondary | ICD-10-CM

## 2019-11-23 DIAGNOSIS — F410 Panic disorder [episodic paroxysmal anxiety] without agoraphobia: Secondary | ICD-10-CM

## 2019-11-23 NOTE — Telephone Encounter (Signed)
Last visit 12/11/2018

## 2019-12-27 ENCOUNTER — Other Ambulatory Visit: Payer: Self-pay | Admitting: Psychiatry

## 2019-12-27 DIAGNOSIS — F329 Major depressive disorder, single episode, unspecified: Secondary | ICD-10-CM

## 2019-12-27 DIAGNOSIS — F32A Depression, unspecified: Secondary | ICD-10-CM

## 2019-12-27 DIAGNOSIS — F411 Generalized anxiety disorder: Secondary | ICD-10-CM

## 2019-12-27 DIAGNOSIS — F410 Panic disorder [episodic paroxysmal anxiety] without agoraphobia: Secondary | ICD-10-CM

## 2019-12-27 NOTE — Telephone Encounter (Signed)
Last visit 11/2018

## 2020-01-02 ENCOUNTER — Other Ambulatory Visit: Payer: Self-pay | Admitting: Psychiatry

## 2020-01-02 ENCOUNTER — Other Ambulatory Visit: Payer: Self-pay | Admitting: Family Medicine

## 2020-01-02 DIAGNOSIS — F411 Generalized anxiety disorder: Secondary | ICD-10-CM

## 2020-01-02 DIAGNOSIS — F438 Other reactions to severe stress: Secondary | ICD-10-CM

## 2020-01-02 DIAGNOSIS — F329 Major depressive disorder, single episode, unspecified: Secondary | ICD-10-CM

## 2020-01-02 DIAGNOSIS — F32A Depression, unspecified: Secondary | ICD-10-CM

## 2020-01-02 DIAGNOSIS — F4389 Other reactions to severe stress: Secondary | ICD-10-CM

## 2020-01-02 DIAGNOSIS — I10 Essential (primary) hypertension: Secondary | ICD-10-CM

## 2020-01-02 DIAGNOSIS — F410 Panic disorder [episodic paroxysmal anxiety] without agoraphobia: Secondary | ICD-10-CM

## 2020-01-02 DIAGNOSIS — M1000 Idiopathic gout, unspecified site: Secondary | ICD-10-CM

## 2020-01-06 ENCOUNTER — Telehealth: Payer: Self-pay | Admitting: *Deleted

## 2020-01-06 NOTE — Telephone Encounter (Signed)
A message was left, re: his follow up visit. 

## 2020-01-16 NOTE — Progress Notes (Deleted)
Cardiology Office Note   Date:  01/16/2020   ID:  Ralph Mack, DOB [birth year], MRN [id number]  PCP:  [name], DO  Cardiologist:   No primary care provider on file. Referring:  [name], DO  No chief complaint on file.      History of Present Illness: Ralph Mack is a 54 y.o. male who is referred by [name], DO for management of difficult to control hypertension and chest pain.  He has no past cardiac history other than a cardiac catheterization in 2009 in [place].  This was for chest discomfort.  He had minimal coronary plaque with 10% PDA stenosis.  There was also mild obtuse marginal stenosis.   His ejection fraction was NL.   He had a POET (Plain Old Exercise Treadmill) without ischemia in Jan 2020.    Since I last saw him ***   he has been started on sertraline and had his Prozac discontinued.  He feels much better.  He is not describing any palpitations, presyncope or syncope.  He said no chest pressure, neck or arm discomfort.  He has no new shortness of breath, PND or orthopnea.  Of note he did wear a ZIO patch.  There was a questionable brief episode of atrial flutter although this could have been artifact.  I brought him back to discuss this.   Past Medical History:  Diagnosis Date  . Allergy   . Arthritis   . Hx of colonic polyp 11/13/2016   10/2016 7 mm ssp/a - recall 2023  . Hypothyroidism   . Lymphocytic colitis 04/19/2019  . Prediabetes   . Recurrent upper respiratory infection (URI)     Past Surgical History:  Procedure Laterality Date  . COLONOSCOPY  2018  . CORONARY ANGIOGRAM  2009     Current Outpatient Medications  Medication Sig Dispense Refill  . allopurinol (ZYLOPRIM) 300 MG tablet Take 1 tablet by mouth once daily 90 tablet 0  . amLODipine (NORVASC) 5 MG tablet Take 1 tablet by mouth once daily 90 tablet 4  . Cholecalciferol (VITAMIN D3) 5000 units TABS 5,000 IU OTC vitamin D3 daily. 90 tablet 3  . Coenzyme Q10 (CO Q  10) 100 MG CAPS Take 3 capsules by mouth daily.    Marland Kitchen dicyclomine (BENTYL) 20 MG tablet Take 1 tablet (20 mg total) by mouth 4 (four) times daily -  before meals and at bedtime. As needed 360 tablet 1  . fexofenadine (ALLEGRA) 180 MG tablet Take 180 mg by mouth daily.    . fluticasone (FLONASE) 50 MCG/ACT nasal spray Place 2 sprays into the nose daily.    . hydrochlorothiazide (MICROZIDE) 12.5 MG capsule Take 1 capsule by mouth once daily 90 capsule 0  . hydrocortisone (ANUSOL-HC) 2.5 % rectal cream Place 1 application rectally 2 (two) times daily. (Patient not taking: Reported on 10/25/2019) 30 g 0  . levothyroxine (SYNTHROID) 50 MCG tablet Take 1 tablet by mouth once daily 90 tablet 0  . losartan (COZAAR) 50 MG tablet Take 1 tablet by mouth once daily 90 tablet 0  . Multiple Vitamins-Minerals (SUPER MEGA VITE 75/BETA CARO PO) Take by mouth.    . omega-3 acid ethyl esters (LOVAZA) 1 g capsule Take 2 capsules (2 g total) by mouth 2 (two) times daily. 180 capsule 3  . ondansetron (ZOFRAN) 4 MG tablet Take 1 tablet (4 mg total) by mouth every 8 (eight) hours as needed for nausea or vomiting. (Patient not taking: Reported on 10/25/2019) 20  tablet 0  . Potassium 99 MG TABS Take 3-4 tablets by mouth daily.    . propranolol (INDERAL) 20 MG tablet Take 1 tablet by mouth 4 times daily 120 tablet 0  . sertraline (ZOLOFT) 100 MG tablet TAKE 2 TABLETS BY MOUTH ONCE DAILY . APPOINTMENT REQUIRED FOR FUTURE REFILLS 60 tablet 0  . Zinc 50 MG CAPS Take 1 capsule by mouth daily.     No current facility-administered medications for this visit.    Allergies:   Patient has no known allergies.    ROS:  Please see the history of present illness.   Otherwise, review of systems are positive for ***.   All other systems are reviewed and negative.    PHYSICAL EXAM: VS:  There were no vitals taken for this visit. , BMI There is no height or weight on file to calculate BMI. GENERAL:  Well appearing NECK:  No jugular  venous distention, waveform within normal limits, carotid upstroke brisk and symmetric, no bruits, no thyromegaly LUNGS:  Clear to auscultation bilaterally CHEST:  Unremarkable HEART:  PMI not displaced or sustained,S1 and S2 within normal limits, no S3, no S4, no clicks, no rubs, *** murmurs ABD:  Flat, positive bowel sounds normal in frequency in pitch, no bruits, no rebound, no guarding, no midline pulsatile mass, no hepatomegaly, no splenomegaly EXT:  2 plus pulses throughout, no edema, no cyanosis no clubbing    ***GENERAL:  Well appearing NECK:  No jugular venous distention, waveform within normal limits, carotid upstroke brisk and symmetric, no bruits, no thyromegaly LUNGS:  Clear to auscultation bilaterally CHEST:  Unremarkable HEART:  PMI not displaced or sustained,S1 and S2 within normal limits, no S3, no S4, no clicks, no rubs, no murmurs ABD:  Flat, positive bowel sounds normal in frequency in pitch, no bruits, no rebound, no guarding, no midline pulsatile mass, no hepatomegaly, no splenomegaly EXT:  2 plus pulses throughout, no edema, no cyanosis no clubbing    EKG:  EKG is *** ordered today. ***  Recent Labs: 06/01/2019: ALT 17; BUN 14; Creatinine, Ser 0.72; Hemoglobin 14.7; Platelets 189; Potassium 4.1; Sodium 138; TSH 1.420    Lipid Panel    Component Value Date/Time   CHOL 168 06/01/2019 1018   TRIG 247 (H) 06/01/2019 1018   HDL 46 06/01/2019 1018   CHOLHDL 3.7 06/01/2019 1018   LDLCALC 81 06/01/2019 1018      Wt Readings from Last 3 Encounters:  10/25/19 225 lb (102.1 kg)  06/28/19 220 lb (99.8 kg)  05/25/19 218 lb (98.9 kg)      Other studies Reviewed: Additional studies/ records that were reviewed today include:   *** Review of the above records demonstrates:  ***   ASSESSMENT AND PLAN:  HTN:   ***  Blood pressures well controlled.  No change in therapy.  CHEST PAIN:   ***  He had a negative Holter.  No change in therapy.  PALPITATIONS:     There was questionable flutter although this may well have been artifact.  Mr. Viren Schenone has a CHA2DS2 - VASc score of 1. ***  At this point he does not need a change in therapy.  I will probably follow-up with a repeat event monitor in 1 year.  Let me know if he has any increasing symptoms.  COVID EDUCATION:  ***    Current medicines are reviewed at length with the patient today.  The patient  concerns regarding medicines.  The following changes have been made:  None  Labs/ tests ordered today include:   None  No orders of the defined types were placed in this encounter.    Disposition:   FU with me one year.      Signed, Minus Breeding, MD  01/16/2020 9:44 PM    [facility]

## 2020-01-17 ENCOUNTER — Encounter: Payer: Self-pay | Admitting: Psychiatry

## 2020-01-17 ENCOUNTER — Ambulatory Visit (INDEPENDENT_AMBULATORY_CARE_PROVIDER_SITE_OTHER): Payer: BC Managed Care – PPO | Admitting: Psychiatry

## 2020-01-17 DIAGNOSIS — F410 Panic disorder [episodic paroxysmal anxiety] without agoraphobia: Secondary | ICD-10-CM

## 2020-01-17 DIAGNOSIS — F32A Depression, unspecified: Secondary | ICD-10-CM

## 2020-01-17 DIAGNOSIS — F329 Major depressive disorder, single episode, unspecified: Secondary | ICD-10-CM

## 2020-01-17 DIAGNOSIS — F411 Generalized anxiety disorder: Secondary | ICD-10-CM

## 2020-01-17 MED ORDER — SERTRALINE HCL 100 MG PO TABS: 150.0000 mg | ORAL_TABLET | Freq: Every day | ORAL | 0 refills | Status: DC

## 2020-01-17 NOTE — Progress Notes (Signed)
Ralph Mack [id number] Oct 29, 1965 54 y.o.  Virtual Visit via Telephone Note  I connected with pt on 01/17/20 at 11:00 AM EDT by telephone and verified that I am speaking with the correct person using two identifiers.   I discussed the limitations, risks, security and privacy concerns of performing an evaluation and management service by telephone and the availability of in person appointments. I also discussed with the patient that there may be a patient responsible charge related to this service. The patient expressed understanding and agreed to proceed.   I discussed the assessment and treatment plan with the patient. The patient was provided an opportunity to ask questions and all were answered. The patient agreed with the plan and demonstrated an understanding of the instructions.   The patient was advised to call back or seek an in-person evaluation if the symptoms worsen or if the condition fails to improve as anticipated.  I provided 20 minutes of non-face-to-face time during this encounter.  The patient was located at home.  The provider was located at [place].   Thayer Headings, PMHNP   Subjective:   Patient ID:  Ralph Mack is a 54 y.o. (DOB [birth year]) male.  Chief Complaint:  Chief Complaint  Patient presents with  . Follow-up    h/o Anxiety and Depression    HPI Ralph Mack presents for follow-up of anxiety and mood have been stable. He denies any recent anxiety or panic attacks. He reports that return to work went smoothly. He reports that his mood has been good. Denies depressed mood. He reports that he has been questioning the need to continue medications. He reports that he has different sleep schedules when he is working versus when he is off. He reports that moving to an earlier shift has been helpful. Sleeping about 6-7 hours a night, which is consistent with baseline. He reports gaining 25-30 lbs due to eating mac and cheese and fried chicken. He  reports that his energy and motivation have been "good." He reports that he gets "bummed out" when the weather is rainy on his days off since he prefers to get outside. Concentration is ok. Denies SI.   Wife's mother and sister are now living in this area so that they can help if needed.   They are getting ready to move next week. He is looking forward to moving. He has 3 dogs and there will be better places for their dogs to play and walk.   Past Psychiatric Medication Trials: Prozac-ineffective Sertraline Alprazolam- effective Propranolol-ineffective  Review of Systems:  Review of Systems  Cardiovascular: Negative for palpitations.       Has apt to see cardiologist tomorrow.   Musculoskeletal: Negative for gait problem.  Neurological: Negative for tremors.  Psychiatric/Behavioral:       Please refer to HPI    Medications: I have reviewed the patient's current medications.  Current Outpatient Medications  Medication Sig Dispense Refill  . allopurinol (ZYLOPRIM) 300 MG tablet Take 1 tablet by mouth once daily 90 tablet 0  . amLODipine (NORVASC) 5 MG tablet Take 1 tablet by mouth once daily 90 tablet 4  . Cholecalciferol (VITAMIN D3) 5000 units TABS 5,000 IU OTC vitamin D3 daily. 90 tablet 3  . Coenzyme Q10 (CO Q 10) 100 MG CAPS Take 3 capsules by mouth daily.    Marland Kitchen dicyclomine (BENTYL) 20 MG tablet Take 1 tablet (20 mg total) by mouth 4 (four) times daily -  before meals and at bedtime. As needed 360  tablet 1  . fexofenadine (ALLEGRA) 180 MG tablet Take 180 mg by mouth daily.    . fluticasone (FLONASE) 50 MCG/ACT nasal spray Place 2 sprays into the nose daily.    . hydrochlorothiazide (MICROZIDE) 12.5 MG capsule Take 1 capsule by mouth once daily 90 capsule 0  . levothyroxine (SYNTHROID) 50 MCG tablet Take 1 tablet by mouth once daily 90 tablet 0  . losartan (COZAAR) 50 MG tablet Take 1 tablet by mouth once daily 90 tablet 0  . Multiple Vitamins-Minerals (SUPER MEGA VITE 75/BETA  CARO PO) Take by mouth.    . omega-3 acid ethyl esters (LOVAZA) 1 g capsule Take 2 capsules (2 g total) by mouth 2 (two) times daily. 180 capsule 3  . Potassium 99 MG TABS Take 3-4 tablets by mouth daily.    . propranolol (INDERAL) 20 MG tablet Take 1 tablet by mouth 4 times daily (Patient taking differently: 4 (four) times daily as needed. ) 120 tablet 0  . sertraline (ZOLOFT) 100 MG tablet Take 1.5 tablets (150 mg total) by mouth daily. 135 tablet 0  . Zinc 50 MG CAPS Take 1 capsule by mouth daily.    . hydrocortisone (ANUSOL-HC) 2.5 % rectal cream Place 1 application rectally 2 (two) times daily. (Patient not taking: Reported on 10/25/2019) 30 g 0  . ondansetron (ZOFRAN) 4 MG tablet Take 1 tablet (4 mg total) by mouth every 8 (eight) hours as needed for nausea or vomiting. (Patient not taking: Reported on 10/25/2019) 20 tablet 0   No current facility-administered medications for this visit.    Medication Side Effects: None  Allergies: No Known Allergies  Past Medical History:  Diagnosis Date  . Allergy   . Arthritis   . Hx of colonic polyp 11/13/2016   10/2016 7 mm ssp/a - recall 2023  . Hypothyroidism   . Lymphocytic colitis 04/19/2019  . Prediabetes   . Recurrent upper respiratory infection (URI)     Family History  Problem Relation Age of Onset  . Asthma Mother   . COPD Mother   . Cancer Mother        Skin  . Cancer - Other Mother        bone, liver, breast, lung  . Emphysema Mother   . Heart disease Father        Pacemaker  . Parkinson's disease Father   . Depression Father   . Depression Daughter   . OCD Daughter   . Alcohol abuse Maternal Grandmother   . Schizophrenia Maternal Grandmother   . Diabetes Paternal Grandfather   . Kidney disease Paternal Grandfather   . Colon polyps Neg Hx   . Colon cancer Neg Hx   . Esophageal cancer Neg Hx   . Stomach cancer Neg Hx   . Rectal cancer Neg Hx     Social History   Socioeconomic History  . Marital status: Married     Spouse name: Not on file  . Number of children: 2  . Years of education: Not on file  . Highest education level: Not on file  Occupational History  . Occupation: truck Publishing rights manager  Tobacco Use  . Smoking status: Current Every Day Smoker    Packs/day: 0.50    Years: 37.00    Pack years: 18.50    Types: Cigarettes  . Smokeless tobacco: Never Used  Substance and Sexual Activity  . Alcohol use: Yes    Alcohol/week: 12.0 standard drinks    Types: 12 Cans of beer per  week  . Drug use: No  . Sexual activity: Not on file  Other Topics Concern  . 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     Social Determinants of Health   Financial Resource Strain:   . Difficulty of Paying Living Expenses:   Food Insecurity:   . Worried About Charity fundraiser in the Last Year:   . Arboriculturist in the Last Year:   Transportation Needs:   . Film/video editor (Medical):   Marland Kitchen Lack of Transportation (Non-Medical):   Physical Activity:   . Days of Exercise per Week:   . Minutes of Exercise per Session:   Stress:   . Feeling of Stress :   Social Connections:   . Frequency of Communication with Friends and Family:   . Frequency of Social Gatherings with Friends and Family:   . Attends Religious Services:   . Active Member of Clubs or Organizations:   . Attends Archivist Meetings:   Marland Kitchen Marital Status:   Intimate Partner Violence:   . Fear of Current or Ex-Partner:   . Emotionally Abused:   Marland Kitchen Physically Abused:   . Sexually Abused:     Past Medical History, Surgical history, Social history, and Family history were reviewed and updated as appropriate.   Please see review of systems for further details on the patient's review from today.   Objective:   Physical Exam:  There were no vitals taken for this visit.  Physical Exam Neurological:     Mental Status: He is alert and oriented to person, place, and time.      Cranial Nerves: No dysarthria.  Psychiatric:        Attention and Perception: Attention and perception normal.        Mood and Affect: Mood normal.        Speech: Speech normal.        Behavior: Behavior is cooperative.        Thought Content: Thought content normal. Thought content is not paranoid or delusional. Thought content does not include homicidal or suicidal ideation. Thought content does not include homicidal or suicidal plan.        Cognition and Memory: Cognition and memory normal.        Judgment: Judgment normal.     Comments: Insight intact     Lab Review:     Component Value Date/Time   NA 138 06/01/2019 1018   K 4.1 06/01/2019 1018   CL 97 06/01/2019 1018   CO2 24 06/01/2019 1018   GLUCOSE 87 06/01/2019 1018   BUN 14 06/01/2019 1018   CREATININE 0.72 (L) 06/01/2019 1018   CALCIUM 8.9 06/01/2019 1018   PROT 6.6 06/01/2019 1018   ALBUMIN 4.2 06/01/2019 1018   AST 17 06/01/2019 1018   ALT 17 06/01/2019 1018   ALKPHOS 68 06/01/2019 1018   BILITOT <0.2 06/01/2019 1018   GFRNONAA 105 06/01/2019 1018   GFRAA 121 06/01/2019 1018       Component Value Date/Time   WBC 5.4 06/01/2019 1018   RBC 4.57 06/01/2019 1018   HGB 14.7 06/01/2019 1018   HCT 42.9 06/01/2019 1018   PLT 189 06/01/2019 1018   MCV 94 06/01/2019 1018   MCH 32.2 06/01/2019 1018   MCHC 34.3 06/01/2019 1018   RDW 11.5 (L) 06/01/2019 1018   LYMPHSABS 2.0 06/01/2019 1018   EOSABS 0.3 06/01/2019 1018   BASOSABS 0.1  06/01/2019 1018    No results found for: POCLITH, LITHIUM   No results found for: PHENYTOIN, PHENOBARB, VALPROATE, CBMZ   .res Assessment: Plan:   Discussed decreasing Sertraline to 150 mg po qd since pt reports that he has not had any recent anxiety or depressive s/s and that previous s/s seemed to have been triggered by multiple psychosocial stressors. Patient advised to contact office with any questions, adverse effects, or acute worsening in signs and symptoms. Pt to f/u in 3  months or sooner if clinically indicated.  Patient advised to contact office with any questions, adverse effects, or acute worsening in signs and symptoms.  Ralph Mack was seen today for follow-up.  Diagnoses and all orders for this visit:  Panic disorder -     sertraline (ZOLOFT) 100 MG tablet; Take 1.5 tablets (150 mg total) by mouth daily.  Generalized anxiety disorder -     sertraline (ZOLOFT) 100 MG tablet; Take 1.5 tablets (150 mg total) by mouth daily.  Depression, unspecified depression type -     sertraline (ZOLOFT) 100 MG tablet; Take 1.5 tablets (150 mg total) by mouth daily.    Please see After Visit Summary for patient specific instructions.  No future appointments.  No orders of the defined types were placed in this encounter.     -------------------------------

## 2020-01-18 ENCOUNTER — Ambulatory Visit: Payer: BC Managed Care – PPO | Admitting: Cardiology

## 2020-01-18 DIAGNOSIS — I10 Essential (primary) hypertension: Secondary | ICD-10-CM

## 2020-01-18 DIAGNOSIS — Z7189 Other specified counseling: Secondary | ICD-10-CM

## 2020-01-18 DIAGNOSIS — R002 Palpitations: Secondary | ICD-10-CM

## 2020-01-18 DIAGNOSIS — R072 Precordial pain: Secondary | ICD-10-CM

## 2020-02-08 ENCOUNTER — Encounter: Payer: Self-pay | Admitting: Cardiology

## 2020-02-08 ENCOUNTER — Telehealth (INDEPENDENT_AMBULATORY_CARE_PROVIDER_SITE_OTHER): Payer: BC Managed Care – PPO | Admitting: Cardiology

## 2020-02-08 VITALS — Ht 67.5 in

## 2020-02-08 DIAGNOSIS — I2510 Atherosclerotic heart disease of native coronary artery without angina pectoris: Secondary | ICD-10-CM

## 2020-02-08 DIAGNOSIS — I10 Essential (primary) hypertension: Secondary | ICD-10-CM | POA: Diagnosis not present

## 2020-02-08 DIAGNOSIS — F17200 Nicotine dependence, unspecified, uncomplicated: Secondary | ICD-10-CM | POA: Diagnosis not present

## 2020-02-08 NOTE — Patient Instructions (Signed)
Medication Instructions:  °Your physician recommends that you continue on your current medications as directed. Please refer to the Current Medication list given to you today. ° °*If you need a refill on your cardiac medications before your next appointment, please call your pharmacy* ° ° °Follow-Up: °At [facility], you and your health needs are our priority.  As part of our continuing mission to provide you with exceptional heart care, we have created designated Provider Care Teams.  These Care Teams include your primary Cardiologist (physician) and Advanced Practice Providers (APPs -  Physician Assistants and Nurse Practitioners) who all work together to provide you with the care you need, when you need it. ° °We recommend signing up for the patient portal called "MyChart".  Sign up information is provided on this After Visit Summary.  MyChart is used to connect with patients for Virtual Visits (Telemedicine).  Patients are able to view lab/test results, encounter notes, upcoming appointments, etc.  Non-urgent messages can be sent to your provider as well.   °To learn more about what you can do with MyChart, go to [email or web address].   ° °Your next appointment:   °12 month(s) ° °The format for your next appointment:   °In Person ° °Provider:   °You may see James Hochrein, MD or one of the following Advanced Practice Providers on your designated Care Team:   °· Rhonda Barrett, PA-C °· Kathryn Lawrence, DNP, ANP °· Cadence Furth, NP ° ° ° °Other Instructions °Please call our office 2 months in advance to schedule your follow-up appointment with Dr. Hochrein. ° °

## 2020-02-08 NOTE — Progress Notes (Signed)
Virtual Visit via Telephone Note   This visit type was conducted due to national recommendations for restrictions regarding the COVID-19 Pandemic (e.g. social distancing) in an effort to limit this patient's exposure and mitigate transmission in our community.  Due to his co-morbid illnesses, this patient is at least at moderate risk for complications without adequate follow up.  This format is felt to be most appropriate for this patient at this time.  The patient did not have access to video technology/had technical difficulties with video requiring transitioning to audio format only (telephone).  All issues noted in this document were discussed and addressed.  No physical exam could be performed with this format.  Please refer to the patient's chart for his  consent to telehealth for [facility].   The patient was identified using 2 identifiers.  Date:  02/08/2020   ID:  Ralph Mack, DOB [birth year], MRN [id number]  Patient Location: Home Provider Location: Home  PCP:  [name], PA-C  Cardiologist:  Dr Percival Spanish Electrophysiologist:  None   Evaluation Performed:  Follow-Up Visit  Chief Complaint:  none  History of Present Illness:    Ralph Mack is a 54 y.o. male with a history of hypertension.  He is also had minor coronary disease at catheterization in 2009 when he had a heart catheterization in [place].  He did have a negative treadmill in January 2020.  He is being followed for GAD as well.  He was contacted today for routine follow-up.  He is recently moved and has had some problems with his new house.  This is because some stress but he tells me his blood pressures been well controlled.  He did not have his cuff with him and was unable to give me a blood pressure reading today.  He has had no problems with his medications.  The patient does not have symptoms concerning for COVID-19 infection (fever, chills, cough, or new shortness of breath).    Past Medical History:   Diagnosis Date  . Allergy   . Arthritis   . Hx of colonic polyp 11/13/2016   10/2016 7 mm ssp/a - recall 2023  . Hypothyroidism   . Lymphocytic colitis 04/19/2019  . Prediabetes   . Recurrent upper respiratory infection (URI)    Past Surgical History:  Procedure Laterality Date  . COLONOSCOPY  2018  . CORONARY ANGIOGRAM  2009     Current Meds  Medication Sig  . allopurinol (ZYLOPRIM) 300 MG tablet Take 1 tablet by mouth once daily  . amLODipine (NORVASC) 5 MG tablet Take 1 tablet by mouth once daily  . Cholecalciferol (VITAMIN D3) 5000 units TABS 5,000 IU OTC vitamin D3 daily.  . Coenzyme Q10 (CO Q 10) 100 MG CAPS Take 3 capsules by mouth daily.  Marland Kitchen dicyclomine (BENTYL) 20 MG tablet Take 1 tablet (20 mg total) by mouth 4 (four) times daily -  before meals and at bedtime. As needed  . fexofenadine (ALLEGRA) 180 MG tablet Take 180 mg by mouth daily.  . fluticasone (FLONASE) 50 MCG/ACT nasal spray Place 2 sprays into the nose daily.  . hydrochlorothiazide (MICROZIDE) 12.5 MG capsule Take 1 capsule by mouth once daily  . hydrocortisone (ANUSOL-HC) 2.5 % rectal cream Place 1 application rectally 2 (two) times daily.  Marland Kitchen levothyroxine (SYNTHROID) 50 MCG tablet Take 1 tablet by mouth once daily  . losartan (COZAAR) 50 MG tablet Take 1 tablet by mouth once daily  . Multiple Vitamins-Minerals (SUPER MEGA VITE 75/BETA CARO PO)  Take by mouth.  . omega-3 acid ethyl esters (LOVAZA) 1 g capsule Take 2 capsules (2 g total) by mouth 2 (two) times daily.  . ondansetron (ZOFRAN) 4 MG tablet Take 1 tablet (4 mg total) by mouth every 8 (eight) hours as needed for nausea or vomiting.  . Potassium 99 MG TABS Take 3-4 tablets by mouth daily.  . propranolol (INDERAL) 20 MG tablet Take 1 tablet by mouth 4 times daily (Patient taking differently: 4 (four) times daily as needed. )  . sertraline (ZOLOFT) 100 MG tablet Take 1.5 tablets (150 mg total) by mouth daily.  . Zinc 50 MG CAPS Take 1 capsule by mouth  daily.     Allergies:   Patient has no known allergies.   Social History   Tobacco Use  . Smoking status: Current Every Day Smoker    Packs/day: 0.50    Years: 37.00    Pack years: 18.50    Types: Cigarettes  . Smokeless tobacco: Never Used  Substance Use Topics  . Alcohol use: Yes    Alcohol/week: 12.0 standard drinks    Types: 12 Cans of beer per week  . Drug use: No     Family Hx: The patient's family history includes Alcohol abuse in his maternal grandmother; Asthma in his mother; COPD in his mother; Cancer in his mother; Cancer - Other in his mother; Depression in his daughter and father; Diabetes in his paternal grandfather; Emphysema in his mother; Heart disease in his father; Kidney disease in his paternal grandfather; OCD in his daughter; Parkinson's disease in his father; Schizophrenia in his maternal grandmother. There is no history of Colon polyps, Colon cancer, Esophageal cancer, Stomach cancer, or Rectal cancer.  ROS:   Please see the history of present illness.    All other systems reviewed and are negative.   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  Recent Labs: 06/01/2019: ALT 17; BUN 14; Creatinine, Ser 0.72; Hemoglobin 14.7; Platelets 189; Potassium 4.1; Sodium 138; TSH 1.420   Recent Lipid Panel Lab Results  Component Value Date/Time   CHOL 168 06/01/2019 10:18 AM   TRIG 247 (H) 06/01/2019 10:18 AM   HDL 46 06/01/2019 10:18 AM   CHOLHDL 3.7 06/01/2019 10:18 AM   LDLCALC 81 06/01/2019 10:18 AM    Wt Readings from Last 3 Encounters:  10/25/19 225 lb (102.1 kg)  06/28/19 220 lb (99.8 kg)  05/25/19 218 lb (98.9 kg)     Objective:    Vital Signs:  Ht 5' 7.5" (1.715 m)   BMI 34.72 kg/m    VITAL SIGNS:  reviewed  ASSESSMENT & PLAN:    HTN- Controlled  CAD- Minor CAD at  cath in 2009  COVID-19 Education: The signs and symptoms of COVID-19 were discussed with the patient and how to seek care for testing (follow up with PCP or arrange E-visit).  The importance of social distancing was discussed today.  Time:   Today, I have spent 10 minutes with the patient with telehealth technology discussing the above problems.     Medication Adjustments/Labs and Tests Ordered: Current medicines are reviewed at length with the patient today.  Concerns regarding medicines are outlined above.   Tests Ordered: No orders of the defined types were placed in this encounter.   Medication Changes: No orders  of the defined types were placed in this encounter.   Follow Up:  In Person with Dr Percival Spanish in one year.   Angelena Form, PA-C  02/08/2020 11:38 AM    [facility]

## 2020-02-22 ENCOUNTER — Encounter: Payer: Self-pay | Admitting: Physician Assistant

## 2020-02-28 ENCOUNTER — Other Ambulatory Visit: Payer: Self-pay | Admitting: Family Medicine

## 2020-02-28 DIAGNOSIS — I10 Essential (primary) hypertension: Secondary | ICD-10-CM

## 2020-03-02 ENCOUNTER — Telehealth: Payer: Self-pay

## 2020-03-02 DIAGNOSIS — I10 Essential (primary) hypertension: Secondary | ICD-10-CM

## 2020-03-02 MED ORDER — LOSARTAN POTASSIUM 50 MG PO TABS: 50.0000 mg | ORAL_TABLET | Freq: Every day | ORAL | 0 refills | Status: DC

## 2020-03-02 MED ORDER — HYDROCHLOROTHIAZIDE 12.5 MG PO CAPS: 12.5000 mg | ORAL_CAPSULE | Freq: Every day | ORAL | 0 refills | Status: DC

## 2020-03-02 NOTE — Telephone Encounter (Signed)
Please call pt to schedule appt.  No further refills until pt is seen.  Charyl Bigger, CMA   Return for FLp, CMp in 3 mo with telehealth ov 3 d later- chol, Bp,

## 2020-04-03 ENCOUNTER — Other Ambulatory Visit: Payer: Self-pay | Admitting: Cardiology

## 2020-04-03 DIAGNOSIS — I10 Essential (primary) hypertension: Secondary | ICD-10-CM

## 2020-04-17 ENCOUNTER — Other Ambulatory Visit: Payer: Self-pay | Admitting: Psychiatry

## 2020-04-17 DIAGNOSIS — F32A Depression, unspecified: Secondary | ICD-10-CM

## 2020-04-17 DIAGNOSIS — F411 Generalized anxiety disorder: Secondary | ICD-10-CM

## 2020-04-17 DIAGNOSIS — F410 Panic disorder [episodic paroxysmal anxiety] without agoraphobia: Secondary | ICD-10-CM

## 2020-04-26 ENCOUNTER — Ambulatory Visit: Admit: 2020-04-26 | Discharge status: Absent without leave | Payer: BC Managed Care – PPO

## 2020-05-05 ENCOUNTER — Telehealth: Payer: Self-pay | Admitting: Physician Assistant

## 2020-05-05 DIAGNOSIS — R350 Frequency of micturition: Secondary | ICD-10-CM

## 2020-05-05 DIAGNOSIS — R32 Unspecified urinary incontinence: Secondary | ICD-10-CM

## 2020-05-05 NOTE — Telephone Encounter (Signed)
Patient is requesting a referral for urology for urinary incontinence  and increased frequency. If approved please place referral.

## 2020-05-07 ENCOUNTER — Other Ambulatory Visit: Payer: Self-pay

## 2020-05-07 ENCOUNTER — Ambulatory Visit
Admission: EM | Admit: 2020-05-07 | Discharge: 2020-05-07 | Disposition: A | Discharge status: Home / Self Care | Payer: BC Managed Care – PPO | Attending: Physician Assistant | Admitting: Physician Assistant

## 2020-05-07 ENCOUNTER — Encounter: Payer: Self-pay | Admitting: Emergency Medicine

## 2020-05-07 DIAGNOSIS — R300 Dysuria: Secondary | ICD-10-CM

## 2020-05-07 DIAGNOSIS — R350 Frequency of micturition: Secondary | ICD-10-CM

## 2020-05-07 LAB — POCT URINALYSIS DIP (MANUAL ENTRY)
Bilirubin, UA: NEGATIVE
Blood, UA: NEGATIVE
Glucose, UA: NEGATIVE mg/dL
Ketones, POC UA: NEGATIVE mg/dL
Leukocytes, UA: NEGATIVE
Nitrite, UA: NEGATIVE
Protein Ur, POC: NEGATIVE mg/dL
Spec Grav, UA: 1.025 (ref 1.010–1.025)
Urobilinogen, UA: 0.2 E.U./dL
pH, UA: 6 (ref 5.0–8.0)

## 2020-05-07 NOTE — Discharge Instructions (Addendum)
No infection. Follow up with urology/PCP for further evaluation.

## 2020-05-07 NOTE — ED Provider Notes (Signed)
[facility]    CSN: [id number] Arrival date & time: 05/07/20  1343      History   Chief Complaint Chief Complaint  Patient presents with  . Dysuria    HPI Ralph Mack is a 54 y.o. male.   54 year old male comes in for 2 month history of intermittent dysuria, urinary frequency. No hematuria, hesitancy, trouble maintaining a stream. Has had urinary urgency chronically. Denies penile discharge. Had mild testicular pain that resolved. Denies testicular swelling. Denies abdominal pain, nausea/vomiting. States PCP sent him here for dipstick prior to urology referral.      Past Medical History:  Diagnosis Date  . Allergy   . Arthritis   . Hx of colonic polyp 11/13/2016   10/2016 7 mm ssp/a - recall 2023  . Hypothyroidism   . Lymphocytic colitis 04/19/2019  . Prediabetes   . Recurrent upper respiratory infection (URI)     Patient Active Problem List   Diagnosis Date Noted  . CAD (coronary artery disease) 02/08/2020  . Rapid weight gain 10/25/2019  . Head injury 06/24/2019  . ?able Irritable bowel syndrome with primarily diarrhea 05/25/2019  . Orthostatic lightheadedness 05/25/2019  . Lymphocytic colitis 04/19/2019  . Hyperlipidemia 01/06/2019  . Mood disorder ([place]) 10/05/2018  . Dyslipidemia 09/11/2018  . Essential hypertension 09/11/2018  . Chest pain 09/11/2018  . HTN (hypertension), malignant 09/02/2018  . Stress-related physiological response affecting physical condition Sep 06, 2018  . Death of family member 06-Sep-2018  . Sleeping difficulty 08/17/2018  . Acute reaction to situational stress 08/17/2018  . Irritability and anger 08/17/2018  . Low testosterone in male 08/17/2018  . White coat syndrome without diagnosis of hypertension 10/14/2017  . Hx of colonic polyp 11/13/2016  . Hypothyroidism 10/24/2016  . Urinary frequency 10/24/2016  . Fatigue 10/24/2016  . OAB (overactive bladder) 10/24/2016  . Low libido 09/26/2016  . Environmental and  seasonal allergies 09/26/2016  . Acute alcohol overingestion, uncomplicated ([place])- fell recently 09/26/2016  . History of substance abuse ([place]) 09/26/2016  . Hematochezia 09/26/2016  . Family hx of colon cancer requiring screening colonoscopy 09/26/2016  . Prediabetes 09/05/2015  . Vitamin D deficiency 09/05/2015  . Hypertriglyceridemia 09/05/2015  . Gout 07/26/2015  . Obesity, Class II, BMI 35-39.9, isolated 07/26/2015  . Smoker 07/26/2015  . Recurrent sinusitis 07/26/2015  . Cervical disc disease 07/26/2015    Past Surgical History:  Procedure Laterality Date  . COLONOSCOPY  2018  . CORONARY ANGIOGRAM  2009       Home Medications    Prior to Admission medications   Medication Sig Start Date End Date Taking? Authorizing Provider  allopurinol (ZYLOPRIM) 300 MG tablet Take 1 tablet by mouth once daily 01/03/20   [name], DO  amLODipine (NORVASC) 5 MG tablet Take 1 tablet by mouth once daily 04/03/20   Minus Breeding, MD  Cholecalciferol (VITAMIN D3) 5000 units TABS 5,000 IU OTC vitamin D3 daily. 10/24/16   Opalski, Neoma Laming, DO  Coenzyme Q10 (CO Q 10) 100 MG CAPS Take 3 capsules by mouth daily.    [provider]  dicyclomine (BENTYL) 20 MG tablet Take 1 tablet (20 mg total) by mouth 4 (four) times daily -  before meals and at bedtime. As needed 05/24/19   Gatha Mayer, MD  fexofenadine (ALLEGRA) 180 MG tablet Take 180 mg by mouth daily.    [provider]  fluticasone (FLONASE) 50 MCG/ACT nasal spray Place 2 sprays into the nose daily.    [provider]  hydrochlorothiazide (MICROZIDE) 12.5 MG capsule Take 1 capsule (12.5 mg total) by mouth daily. 03/02/20   [name], PA-C  hydrocortisone (ANUSOL-HC) 2.5 % rectal cream Place 1 application rectally 2 (two) times daily. 04/09/19   Gatha Mayer, MD  levothyroxine (SYNTHROID) 50 MCG tablet Take 1 tablet by mouth once daily 06/23/19   Opalski, Neoma Laming, DO  losartan (COZAAR) 50 MG tablet Take 1  tablet (50 mg total) by mouth daily. 03/02/20   Abonza, Herb Grays, PA-C  Multiple Vitamins-Minerals (SUPER MEGA VITE 75/BETA CARO PO) Take by mouth.    [provider]  omega-3 acid ethyl esters (LOVAZA) 1 g capsule Take 2 capsules (2 g total) by mouth 2 (two) times daily. 06/28/19   Opalski, Neoma Laming, DO  ondansetron (ZOFRAN) 4 MG tablet Take 1 tablet (4 mg total) by mouth every 8 (eight) hours as needed for nausea or vomiting. 02/26/19   Withrow, Elyse Jarvis, FNP  Potassium 99 MG TABS Take 3-4 tablets by mouth daily.    [provider]  propranolol (INDERAL) 20 MG tablet Take 1 tablet by mouth 4 times daily Patient taking differently: 4 (four) times daily as needed.  01/03/20   Opalski, Deborah, DO  sertraline (ZOLOFT) 100 MG tablet TAKE 1 & 1/2 (ONE & ONE-HALF) TABLETS BY MOUTH ONCE DAILY 04/18/20   Thayer Headings, PMHNP  Zinc 50 MG CAPS Take 1 capsule by mouth daily.    [provider]    Family History Family History  Problem Relation Age of Onset  . Asthma Mother   . COPD Mother   . Cancer Mother        Skin  . Cancer - Other Mother        bone, liver, breast, lung  . Emphysema Mother   . Heart disease Father        Pacemaker  . Parkinson's disease Father   . Depression Father   . Depression Daughter   . OCD Daughter   . Alcohol abuse Maternal Grandmother   . Schizophrenia Maternal Grandmother   . Diabetes Paternal Grandfather   . Kidney disease Paternal Grandfather   . Colon polyps Neg Hx   . Colon cancer Neg Hx   . Esophageal cancer Neg Hx   . Stomach cancer Neg Hx   . Rectal cancer Neg Hx     Social History Social History   Tobacco Use  . Smoking status: Current Every Day Smoker    Packs/day: 0.50    Years: 37.00    Pack years: 18.50    Types: Cigarettes  . Smokeless tobacco: Never Used  Vaping Use  . Vaping Use: Former  Substance Use Topics  . Alcohol use: Yes    Alcohol/week: 12.0 standard drinks    Types: 12 Cans of beer per week  . Drug  use: No     Allergies   Patient has no known allergies.   Review of Systems Review of Systems  Reason unable to perform ROS: See HPI as above.     Physical Exam Triage Vital Signs ED Triage Vitals  Enc Vitals Group     BP 05/07/20 1425 (!) 142/91     Pulse Rate 05/07/20 1425 60     Resp 05/07/20 1425 16     Temp 05/07/20 1425 98.1 F (36.7 C)     Temp Source 05/07/20 1425 Oral     SpO2 05/07/20 1425 91 %     Weight --      Height --  Head Circumference --      Peak Flow --      Pain Score 05/07/20 1502 0     Pain Loc --      Pain Edu? --      Excl. in [place]? --    No data found.  Updated Vital Signs BP (!) 142/91 (BP Location: Left Arm)   Pulse 60   Temp 98.1 F (36.7 C) (Oral)   Resp 16   SpO2 91%   Physical Exam Constitutional:      General: He is not in acute distress.    Appearance: Normal appearance. He is well-developed. He is not toxic-appearing or diaphoretic.  HENT:     Head: Normocephalic and atraumatic.  Eyes:     Conjunctiva/sclera: Conjunctivae normal.     Pupils: Pupils are equal, round, and reactive to light.  Pulmonary:     Effort: Pulmonary effort is normal. No respiratory distress.  Musculoskeletal:     Cervical back: Normal range of motion and neck supple.  Skin:    General: Skin is warm and dry.  Neurological:     Mental Status: He is alert and oriented to person, place, and time.      UC Treatments / Results  Labs (all labs ordered are listed, but only abnormal results are displayed) Labs Reviewed  POCT URINALYSIS DIP (MANUAL ENTRY)    EKG   Radiology No results found.  Procedures Procedures (including critical care time)  Medications Ordered in UC Medications - No data to display  Initial Impression / Assessment and Plan / UC Course  I have reviewed the triage vital signs and the nursing notes.  Pertinent labs & imaging results that were available during my care of the patient were reviewed by me and  considered in my medical decision making (see chart for details).    Patient with chronic urinary symptoms, PCP sent over for dipstick. Dipstick negative. Follow up with PCP/urology for further evaluation.  Final Clinical Impressions(s) / UC Diagnoses   Final diagnoses:  Dysuria  Urinary frequency   ED Prescriptions    None     PDMP not reviewed this encounter.   Ok Edwards, PA-C 05/07/20 1521

## 2020-05-07 NOTE — ED Triage Notes (Signed)
Pt sts burning sensation and frequency intermittent x 2 months

## 2020-05-08 ENCOUNTER — Encounter: Payer: Self-pay | Admitting: Physician Assistant

## 2020-05-08 NOTE — Addendum Note (Signed)
Addended by: Fonnie Mu on: 05/08/2020 09:43 AM   Modules accepted: Orders

## 2020-05-12 ENCOUNTER — Other Ambulatory Visit: Payer: Self-pay | Admitting: Family Medicine

## 2020-05-12 DIAGNOSIS — F438 Other reactions to severe stress: Secondary | ICD-10-CM

## 2020-05-12 DIAGNOSIS — F4389 Other reactions to severe stress: Secondary | ICD-10-CM

## 2020-05-12 DIAGNOSIS — I10 Essential (primary) hypertension: Secondary | ICD-10-CM

## 2020-05-14 ENCOUNTER — Other Ambulatory Visit: Payer: Self-pay | Admitting: Physician Assistant

## 2020-05-14 ENCOUNTER — Other Ambulatory Visit: Payer: Self-pay | Admitting: Family Medicine

## 2020-05-14 DIAGNOSIS — M1000 Idiopathic gout, unspecified site: Secondary | ICD-10-CM

## 2020-05-14 DIAGNOSIS — I10 Essential (primary) hypertension: Secondary | ICD-10-CM

## 2020-05-15 ENCOUNTER — Telehealth: Payer: Self-pay | Admitting: Physician Assistant

## 2020-05-15 NOTE — Telephone Encounter (Signed)
Please contact patient to schedule OV for further refills. AS, CMA

## 2020-05-16 ENCOUNTER — Ambulatory Visit: Payer: BLUE CROSS/BLUE SHIELD | Admitting: Urology

## 2020-05-22 ENCOUNTER — Encounter: Payer: Self-pay | Admitting: Physician Assistant

## 2020-05-22 ENCOUNTER — Telehealth: Payer: Self-pay | Admitting: Physician Assistant

## 2020-05-22 DIAGNOSIS — I10 Essential (primary) hypertension: Secondary | ICD-10-CM

## 2020-05-22 DIAGNOSIS — F4389 Other reactions to severe stress: Secondary | ICD-10-CM

## 2020-05-22 DIAGNOSIS — M1000 Idiopathic gout, unspecified site: Secondary | ICD-10-CM

## 2020-05-22 MED ORDER — LOSARTAN POTASSIUM 50 MG PO TABS: 50.0000 mg | ORAL_TABLET | Freq: Every day | ORAL | 0 refills | Status: DC

## 2020-05-22 MED ORDER — ALLOPURINOL 300 MG PO TABS: 300.0000 mg | ORAL_TABLET | Freq: Every day | ORAL | 0 refills | Status: DC

## 2020-05-22 MED ORDER — PROPRANOLOL HCL 20 MG PO TABS: ORAL_TABLET | ORAL | 0 refills | Status: DC

## 2020-05-22 NOTE — Telephone Encounter (Signed)
Patient is requesting a refill of his allopurinol, losartan and propranolol. If approved please send to [facility] on [address].

## 2020-05-22 NOTE — Addendum Note (Signed)
Addended by: Mickel Crow on: 05/22/2020 03:18 PM   Modules accepted: Orders

## 2020-05-22 NOTE — Telephone Encounter (Signed)
Refill sent to requested pharmacy. Mychart message from patient answered and advised pt to schedule apt for further refills. AS, CMA

## 2020-05-30 ENCOUNTER — Telehealth: Payer: Self-pay | Admitting: Physician Assistant

## 2020-05-30 DIAGNOSIS — I10 Essential (primary) hypertension: Secondary | ICD-10-CM

## 2020-05-30 MED ORDER — LOSARTAN POTASSIUM 50 MG PO TABS: 50.0000 mg | ORAL_TABLET | Freq: Every day | ORAL | 0 refills | Status: DC

## 2020-05-30 NOTE — Telephone Encounter (Signed)
Patient is aware original rx sent over 05/22/20 and that we have receipt confirmation from pharmacy that they received this medication that day.   Patient also aware I am resending medication. AS< CMA

## 2020-05-30 NOTE — Addendum Note (Signed)
Addended by: Mickel Crow on: 05/30/2020 10:29 AM   Modules accepted: Orders

## 2020-05-30 NOTE — Telephone Encounter (Signed)
Patient is wanting to speak with nurse about his medication, he said [facility] never got an order for losartan. I advised patient that it was sent along with his allopurinol and propranolol, so that if they had those two meds they should have the losartan as they were all ordered together. He called pharm back after receiving this message and was still told that was not true. He is getting frustrated at the disconnect and wanted to speak with ordering user to make sure order was placed accurately. I told patient I would send message to nurse.
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
## 2020-06-06 ENCOUNTER — Ambulatory Visit: Payer: BC Managed Care – PPO | Admitting: Urology

## 2020-06-06 ENCOUNTER — Other Ambulatory Visit: Payer: Self-pay

## 2020-06-06 VITALS — BP 138/89 | HR 54 | Ht 67.0 in | Wt 240.0 lb

## 2020-06-06 DIAGNOSIS — R32 Unspecified urinary incontinence: Secondary | ICD-10-CM | POA: Diagnosis not present

## 2020-06-06 DIAGNOSIS — R3915 Urgency of urination: Secondary | ICD-10-CM | POA: Diagnosis not present

## 2020-06-06 LAB — BLADDER SCAN AMB NON-IMAGING: Scan Result: 52

## 2020-06-06 MED ORDER — TAMSULOSIN HCL 0.4 MG PO CAPS: 0.4000 mg | ORAL_CAPSULE | Freq: Every day | ORAL | 3 refills | Status: DC

## 2020-06-07 ENCOUNTER — Telehealth: Payer: Self-pay

## 2020-06-07 LAB — URINALYSIS, COMPLETE
Bilirubin, UA: NEGATIVE
Glucose, UA: NEGATIVE
Ketones, UA: NEGATIVE
Leukocytes,UA: NEGATIVE
Nitrite, UA: NEGATIVE
Protein,UA: NEGATIVE
RBC, UA: NEGATIVE
Specific Gravity, UA: 1.025 (ref 1.005–1.030)
Urobilinogen, Ur: 0.2 mg/dL (ref 0.2–1.0)
pH, UA: 6.5 (ref 5.0–7.5)

## 2020-06-07 LAB — MICROSCOPIC EXAMINATION: Bacteria, UA: NONE SEEN

## 2020-06-07 LAB — PSA: Prostate Specific Ag, Serum: 0.8 ng/mL (ref 0.0–4.0)

## 2020-06-07 NOTE — Telephone Encounter (Signed)
Pt aware of results 

## 2020-06-07 NOTE — Telephone Encounter (Signed)
-----   Message from Hollice Espy, MD sent at 06/07/2020  8:10 AM EDT ----- PSA is excellent, 0.8  Hollice Espy, MD

## 2020-06-22 ENCOUNTER — Other Ambulatory Visit: Payer: Self-pay | Admitting: Psychiatry

## 2020-06-22 DIAGNOSIS — F410 Panic disorder [episodic paroxysmal anxiety] without agoraphobia: Secondary | ICD-10-CM

## 2020-06-22 DIAGNOSIS — F411 Generalized anxiety disorder: Secondary | ICD-10-CM

## 2020-06-22 DIAGNOSIS — F32A Depression, unspecified: Secondary | ICD-10-CM

## 2020-06-22 NOTE — Telephone Encounter (Signed)
Ralph Mack, please review message   Ralph Mack, please get him scheduled for apt also

## 2020-06-22 NOTE — Telephone Encounter (Signed)
PT called to say that he has not decreased the dose of the Zoloft. He is taking 200mg  per day. Can we RF for this? He is going through a lot of stress right and does not want to decrease at this time.

## 2020-07-14 ENCOUNTER — Other Ambulatory Visit: Payer: Self-pay | Admitting: Psychiatry

## 2020-07-14 DIAGNOSIS — F410 Panic disorder [episodic paroxysmal anxiety] without agoraphobia: Secondary | ICD-10-CM

## 2020-07-14 DIAGNOSIS — F32A Depression, unspecified: Secondary | ICD-10-CM

## 2020-07-14 DIAGNOSIS — F411 Generalized anxiety disorder: Secondary | ICD-10-CM

## 2020-07-17 NOTE — Telephone Encounter (Signed)
No more refills until scheduled for apt

## 2020-07-17 NOTE — Telephone Encounter (Signed)
Please get patient scheduled. Over due for apt

## 2020-07-17 NOTE — Telephone Encounter (Signed)
Pt lm stating is refill was for a 3wk supply and that he didn't increase his dosage. Stated to make sure directions are changed at the pharmacy

## 2020-07-18 ENCOUNTER — Other Ambulatory Visit: Payer: Self-pay | Admitting: Psychiatry

## 2020-07-18 DIAGNOSIS — F411 Generalized anxiety disorder: Secondary | ICD-10-CM

## 2020-07-18 DIAGNOSIS — F410 Panic disorder [episodic paroxysmal anxiety] without agoraphobia: Secondary | ICD-10-CM

## 2020-07-18 DIAGNOSIS — F32A Depression, unspecified: Secondary | ICD-10-CM

## 2020-07-18 NOTE — Progress Notes (Signed)
07/19/2020 12:48 PM   Ralph Mack 23-Sep-1966 [id number]  Referring provider: [name], PA-C [place],  [place] [address] Chief Complaint  Patient presents with  . Urinary Incontinence    HPI: Ralph Mack is a 54 y.o. male who returns for a 1 month follow up of urinary frequency.   At last visit, he complained of urinary frequency, urgency and nocturia.  He also had some pressure/burning associated with urination.  He was prescribed Flomax and Myrbetriq in combination to help with his both irritative and obstructive urinary symptoms.  Today, he reports that his urinary symptoms are improving.  He still has some frequency but less.  His stream is similar.  He no longer has burning or pressure.  He still gets up at night 3 times.  He ran out of Myrbetriq about a week ago.  He cannot tell the difference being on or off of Myrbetriq.  He is drinking dark sodas, Gatorade, Mnt Dew and coffee.   Patient is not sure if he has sleep apnea, but has been told that he snores.    IPSS    Row Name 07/19/20 0900         International Prostate Symptom Score   How often have you had the sensation of not emptying your bladder? Less than 1 in 5     How often have you had to urinate less than every two hours? About half the time     How often have you found you stopped and started again several times when you urinated? Less than 1 in 5 times     How often have you found it difficult to postpone urination? Less than half the time     How often have you had a weak urinary stream? Less than 1 in 5 times     How often have you had to strain to start urination? Less than 1 in 5 times     How many times did you typically get up at night to urinate? 4 Times     Total IPSS Score 13       Quality of Life due to urinary symptoms   If you were to spend the rest of your life with your urinary condition just the way it is now how would you feel about that? Mostly  Disatisfied            Score:  1-7 Mild 8-19 Moderate 20-35 Severe   PMH: Past Medical History:  Diagnosis Date  . Allergy   . Arthritis   . Hx of colonic polyp 11/13/2016   10/2016 7 mm ssp/a - recall 2023  . Hypothyroidism   . Lymphocytic colitis 04/19/2019  . Prediabetes   . Recurrent upper respiratory infection (URI)     Surgical History: Past Surgical History:  Procedure Laterality Date  . COLONOSCOPY  2018  . CORONARY ANGIOGRAM  2009    Home Medications:  Allergies as of 07/19/2020   No Known Allergies     Medication List       Accurate as of July 19, 2020 12:48 PM. If you have any questions, ask your nurse or doctor.        allopurinol 300 MG tablet Commonly known as: ZYLOPRIM Take 1 tablet (300 mg total) by mouth daily. **PATIENT NEEDS APT FOR FURTHER REFILLS**   amLODipine 5 MG tablet Commonly known as: NORVASC Take 1 tablet by mouth once daily   Co Q 10 100 MG  Caps Take 3 capsules by mouth daily.   dicyclomine 20 MG tablet Commonly known as: BENTYL Take 1 tablet (20 mg total) by mouth 4 (four) times daily -  before meals and at bedtime. As needed   fexofenadine 180 MG tablet Commonly known as: ALLEGRA Take 180 mg by mouth daily.   fluticasone 50 MCG/ACT nasal spray Commonly known as: FLONASE Place 2 sprays into the nose daily.   hydrochlorothiazide 12.5 MG capsule Commonly known as: MICROZIDE TAKE 1 CAPSULE BY MOUTH ONCE DAILY -  OFFICE  VISIT  REQUIRED  PRIOR  TO  ANY  FURTHER  REFILLS   hydrocortisone 2.5 % rectal cream Commonly known as: Anusol-HC Place 1 application rectally 2 (two) times daily.   levothyroxine 50 MCG tablet Commonly known as: SYNTHROID Take 1 tablet by mouth once daily   losartan 50 MG tablet Commonly known as: COZAAR Take 1 tablet (50 mg total) by mouth daily.   omega-3 acid ethyl esters 1 g capsule Commonly known as: LOVAZA Take 2 capsules (2 g total) by mouth 2 (two) times daily.   Potassium 99  MG Tabs Take 3-4 tablets by mouth daily.   propranolol 20 MG tablet Commonly known as: INDERAL Take 1 tablet by mouth 4 times daily **PATIENT NEEDS APT FOR FURTHER REFILLS**   sertraline 100 MG tablet Commonly known as: ZOLOFT Take 2 tablets (200 mg total) by mouth daily.   SUPER MEGA VITE 75/BETA CARO PO Take by mouth.   tamsulosin 0.4 MG Caps capsule Commonly known as: Flomax Take 1 capsule (0.4 mg total) by mouth daily.   Vitamin D3 125 MCG (5000 UT) Tabs 5,000 IU OTC vitamin D3 daily.   Zinc 50 MG Caps Take 1 capsule by mouth daily.       Allergies: No Known Allergies  Family History: Family History  Problem Relation Age of Onset  . Asthma Mother   . COPD Mother   . Cancer Mother        Skin  . Cancer - Other Mother        bone, liver, breast, lung  . Emphysema Mother   . Heart disease Father        Pacemaker  . Parkinson's disease Father   . Depression Father   . Depression Daughter   . OCD Daughter   . Alcohol abuse Maternal Grandmother   . Schizophrenia Maternal Grandmother   . Diabetes Paternal Grandfather   . Kidney disease Paternal Grandfather   . Colon polyps Neg Hx   . Colon cancer Neg Hx   . Esophageal cancer Neg Hx   . Stomach cancer Neg Hx   . Rectal cancer Neg Hx     Social History:  reports that he has been smoking cigarettes. He has a 18.50 pack-year smoking history. He has never used smokeless tobacco. He reports current alcohol use of about 12.0 standard drinks of alcohol per week. He reports that he does not use drugs.   Physical Exam: Constitutional:  Alert and oriented, No acute distress. HEENT: [place] AT, moist mucus membranes.  Trachea midline, no masses. Cardiovascular: No clubbing, cyanosis, or edema. Respiratory: Normal respiratory effort, no increased work of breathing. Skin: No rashes, bruises or suspicious lesions. Neurologic: Grossly intact, no focal deficits, moving all 4 extremities. Psychiatric: Normal mood and  affect.  Laboratory Data:  Pertinent Imaging: Results for orders placed or performed in visit on 07/19/20  BLADDER SCAN AMB NON-IMAGING  Result Value Ref Range   Scan Result 0  ml      Assessment & Plan:    1. Urinary frequency Minimal improvement on Myrbetriq- no longer taking Discussed behavioral modifications at length-drinking copious amounts of coffee and fluids likely contributing to his symptoms Continue flomax, this seems to be heling IPSS score is 13. PVR is 0 mL. RTC in 1 year with IPSS and PVR.  2. Nocturia Likely multifactorial He does snore at nighttime, suspect he has a component of obstructive sleep apnea, advised follow-up with PCP to discuss the option of a sleep study  Return in about 1 year (around 07/19/2021) for 1year w/IPSS PVR.  [facility] [address], [place], [place] [address] [phone]  I, Selena Batten, am acting as a scribe for Dr. Hollice Espy.  I have reviewed the above documentation for accuracy and completeness, and I agree with the above.   Hollice Espy, MD

## 2020-07-19 ENCOUNTER — Other Ambulatory Visit: Payer: Self-pay

## 2020-07-19 ENCOUNTER — Ambulatory Visit: Payer: BC Managed Care – PPO | Admitting: Urology

## 2020-07-19 DIAGNOSIS — R32 Unspecified urinary incontinence: Secondary | ICD-10-CM | POA: Diagnosis not present

## 2020-07-19 LAB — BLADDER SCAN AMB NON-IMAGING: Scan Result: 0

## 2020-07-20 ENCOUNTER — Ambulatory Visit: Payer: Self-pay | Admitting: Urology

## 2020-07-31 ENCOUNTER — Other Ambulatory Visit: Payer: Self-pay | Admitting: Physician Assistant

## 2020-07-31 ENCOUNTER — Other Ambulatory Visit: Payer: Self-pay | Admitting: Internal Medicine

## 2020-07-31 ENCOUNTER — Telehealth: Payer: Self-pay | Admitting: Physician Assistant

## 2020-07-31 DIAGNOSIS — I10 Essential (primary) hypertension: Secondary | ICD-10-CM

## 2020-07-31 NOTE — Telephone Encounter (Signed)
Please call patient to schedule apt for further med refills.   AS,CMA

## 2020-08-07 ENCOUNTER — Encounter: Payer: Self-pay | Admitting: Physician Assistant

## 2020-08-07 ENCOUNTER — Other Ambulatory Visit: Payer: Self-pay

## 2020-08-07 ENCOUNTER — Ambulatory Visit: Payer: BC Managed Care – PPO | Admitting: Physician Assistant

## 2020-08-07 VITALS — BP 123/72 | HR 61 | Ht 67.5 in | Wt 242.4 lb

## 2020-08-07 DIAGNOSIS — E039 Hypothyroidism, unspecified: Secondary | ICD-10-CM

## 2020-08-07 DIAGNOSIS — F39 Unspecified mood [affective] disorder: Secondary | ICD-10-CM

## 2020-08-07 DIAGNOSIS — I10 Essential (primary) hypertension: Secondary | ICD-10-CM | POA: Diagnosis not present

## 2020-08-07 DIAGNOSIS — E785 Hyperlipidemia, unspecified: Secondary | ICD-10-CM

## 2020-08-07 DIAGNOSIS — F4389 Other reactions to severe stress: Secondary | ICD-10-CM

## 2020-08-07 DIAGNOSIS — I2510 Atherosclerotic heart disease of native coronary artery without angina pectoris: Secondary | ICD-10-CM

## 2020-08-07 DIAGNOSIS — R7303 Prediabetes: Secondary | ICD-10-CM

## 2020-08-07 DIAGNOSIS — F438 Other reactions to severe stress: Secondary | ICD-10-CM

## 2020-08-07 DIAGNOSIS — E559 Vitamin D deficiency, unspecified: Secondary | ICD-10-CM

## 2020-08-07 DIAGNOSIS — M1000 Idiopathic gout, unspecified site: Secondary | ICD-10-CM

## 2020-08-07 NOTE — Progress Notes (Signed)
Established Patient Office Visit  Subjective:  Patient ID: Ralph Mack, male    DOB: Jan 21, 1966  Age: 54 y.o. MRN: [id number]  CC:  Chief Complaint  Patient presents with  . Hypertension  . Hyperlipidemia    HPI Ralph Mack presents for follow up on hypertension and hyperlipidemia. Requesting medication refills.  HTN: Pt denies chest pain, palpitations, dizziness or lower extremity swelling. Taking medication as directed without side effects. Has not been checking blood pressure at home but feels like it has been doing fine. Pt follows a low salt diet.  HLD: Pt reports taking fish oil. Is followed by cardiology for CAD.  Gout: Doing well. Reports last flare-up has been few years ago. Reports medication compliance.  Mood: Followed by [facility] for GAD, panic disorder, and depression.  Past Medical History:  Diagnosis Date  . Allergy   . Arthritis   . Hx of colonic polyp 11/13/2016   10/2016 7 mm ssp/a - recall 2023  . Hypothyroidism   . Lymphocytic colitis 04/19/2019  . Prediabetes   . Recurrent upper respiratory infection (URI)     Past Surgical History:  Procedure Laterality Date  . COLONOSCOPY  2018  . CORONARY ANGIOGRAM  2009    Family History  Problem Relation Age of Onset  . Asthma Mother   . COPD Mother   . Cancer Mother        Skin  . Cancer - Other Mother        bone, liver, breast, lung  . Emphysema Mother   . Heart disease Father        Pacemaker  . Parkinson's disease Father   . Depression Father   . Depression Daughter   . OCD Daughter   . Alcohol abuse Maternal Grandmother   . Schizophrenia Maternal Grandmother   . Diabetes Paternal Grandfather   . Kidney disease Paternal Grandfather   . Colon polyps Neg Hx   . Colon cancer Neg Hx   . Esophageal cancer Neg Hx   . Stomach cancer Neg Hx   . Rectal cancer Neg Hx     Social History   Socioeconomic History  . Marital status: Married    Spouse name: Not on file  . Number of children: 2  .  Years of education: Not on file  . Highest education level: Not on file  Occupational History  . Occupation: truck Publishing rights manager  Tobacco Use  . Smoking status: Current Every Day Smoker    Packs/day: 0.50    Years: 37.00    Pack years: 18.50    Types: Cigarettes  . Smokeless tobacco: Never Used  Vaping Use  . Vaping Use: Former  Substance and Sexual Activity  . Alcohol use: Yes    Alcohol/week: 12.0 standard drinks    Types: 12 Cans of beer per week  . Drug use: No  . Sexual activity: Not on file  Other Topics Concern  . 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     Social Determinants of Health   Financial Resource Strain:   . Difficulty of Paying Living Expenses: Not on file  Food Insecurity:   . Worried About Charity fundraiser in the Last Year: Not on file  . Ran Out of Food in the Last Year: Not on file  Transportation Needs:   . Lack of Transportation (Medical): Not on file  . Lack of Transportation (Non-Medical): Not on file  Physical Activity:   .  Days of Exercise per Week: Not on file  . Minutes of Exercise per Session: Not on file  Stress:   . Feeling of Stress : Not on file  Social Connections:   . Frequency of Communication with Friends and Family: Not on file  . Frequency of Social Gatherings with Friends and Family: Not on file  . Attends Religious Services: Not on file  . Active Member of Clubs or Organizations: Not on file  . Attends Archivist Meetings: Not on file  . Marital Status: Not on file  Intimate Partner Violence:   . Fear of Current or Ex-Partner: Not on file  . Emotionally Abused: Not on file  . Physically Abused: Not on file  . Sexually Abused: Not on file    Outpatient Medications Prior to Visit  Medication Sig Dispense Refill  . amLODipine (NORVASC) 5 MG tablet Take 1 tablet by mouth once daily 90 tablet 3  . Cholecalciferol (VITAMIN D3) 5000 units TABS 5,000 IU OTC vitamin  D3 daily. 90 tablet 3  . Coenzyme Q10 (CO Q 10) 100 MG CAPS Take 3 capsules by mouth daily.    Marland Kitchen dicyclomine (BENTYL) 20 MG tablet TAKE 1 TABLET BY MOUTH 4 TIMES DAILY BEFORE MEAL(S) AND AT BEDTIME AS NEEDED 360 tablet 0  . fexofenadine (ALLEGRA) 180 MG tablet Take 180 mg by mouth daily.    . fluticasone (FLONASE) 50 MCG/ACT nasal spray Place 2 sprays into the nose daily.    Marland Kitchen levothyroxine (SYNTHROID) 50 MCG tablet Take 1 tablet by mouth once daily 90 tablet 0  . losartan (COZAAR) 50 MG tablet Take 1 tablet (50 mg total) by mouth daily. 60 tablet 0  . Multiple Vitamins-Minerals (SUPER MEGA VITE 75/BETA CARO PO) Take by mouth.    . omega-3 acid ethyl esters (LOVAZA) 1 g capsule Take 2 capsules (2 g total) by mouth 2 (two) times daily. 180 capsule 3  . Potassium 99 MG TABS Take 3-4 tablets by mouth daily.    . tamsulosin (FLOMAX) 0.4 MG CAPS capsule Take 1 capsule (0.4 mg total) by mouth daily. 30 capsule 3  . Zinc 50 MG CAPS Take 1 capsule by mouth daily.    Marland Kitchen allopurinol (ZYLOPRIM) 300 MG tablet Take 1 tablet (300 mg total) by mouth daily. **PATIENT NEEDS APT FOR FURTHER REFILLS** 60 tablet 0  . hydrochlorothiazide (MICROZIDE) 12.5 MG capsule Take 1 capsule (12.5 mg total) by mouth daily. **NEEDS APT FOR FURTHER REFILLS** 30 capsule 0  . propranolol (INDERAL) 20 MG tablet Take 1 tablet by mouth 4 times daily **PATIENT NEEDS APT FOR FURTHER REFILLS** 240 tablet 0  . sertraline (ZOLOFT) 100 MG tablet Take 2 tablets (200 mg total) by mouth daily. 60 tablet 0  . hydrocortisone (ANUSOL-HC) 2.5 % rectal cream Place 1 application rectally 2 (two) times daily. (Patient not taking: Reported on 08/07/2020) 30 g 0   No facility-administered medications prior to visit.    No Known Allergies  ROS Review of Systems A fourteen system review of systems was performed and found to be positive as per HPI.   Objective:    Physical Exam General:  Well Developed, well nourished, appropriate for stated age.   Neuro:  Alert and oriented,  extra-ocular muscles intact  HEENT:  Normocephalic, atraumatic, neck supple Skin:  no gross rash, warm, pink. Cardiac:  RRR, S1 S2, no MRG Respiratory:  ECTA B/L and A/P, Not using accessory muscles, speaking in full sentences- unlabored. Vascular:  Ext warm, no  cyanosis apprec.; cap RF less 2 sec. Psych:  No HI/SI, judgement and insight good, Euthymic mood. Full Affect.  BP 123/72   Pulse 61   Ht 5' 7.5" (1.715 m)   Wt 242 lb 6.4 oz (110 kg)   SpO2 92%   BMI 37.40 kg/m  Wt Readings from Last 3 Encounters:  08/07/20 242 lb 6.4 oz (110 kg)  06/06/20 240 lb (108.9 kg)  10/25/19 225 lb (102.1 kg)     Health Maintenance Due  Topic Date Due  . COVID-19 Vaccine (1) Never done  . TETANUS/TDAP  Never done  . INFLUENZA VACCINE  04/30/2020    There are no preventive care reminders to display for this patient.  Lab Results  Component Value Date   TSH 1.450 08/07/2020   Lab Results  Component Value Date   WBC 6.3 08/07/2020   HGB 14.8 08/07/2020   HCT 43.5 08/07/2020   MCV 94 08/07/2020   PLT 209 08/07/2020   Lab Results  Component Value Date   NA 140 08/07/2020   K 4.8 08/07/2020   CO2 28 08/07/2020   GLUCOSE 95 08/07/2020   BUN 12 08/07/2020   CREATININE 0.71 (L) 08/07/2020   BILITOT <0.2 08/07/2020   ALKPHOS 58 08/07/2020   AST 15 08/07/2020   ALT 20 08/07/2020   PROT 6.4 08/07/2020   ALBUMIN 4.4 08/07/2020   CALCIUM 9.0 08/07/2020   Lab Results  Component Value Date   CHOL 190 08/07/2020   Lab Results  Component Value Date   HDL 59 08/07/2020   Lab Results  Component Value Date   LDLCALC 94 08/07/2020   Lab Results  Component Value Date   TRIG 218 (H) 08/07/2020   Lab Results  Component Value Date   CHOLHDL 3.2 08/07/2020   Lab Results  Component Value Date   HGBA1C 5.6 08/07/2020      Assessment & Plan:   Problem List Items Addressed This Visit      Cardiovascular and Mediastinum   HTN (hypertension),  malignant - Primary   Relevant Medications   hydrochlorothiazide (MICROZIDE) 12.5 MG capsule   propranolol (INDERAL) 20 MG tablet   Other Relevant Orders   CBC (Completed)   Hemoglobin A1c (Completed)   Lipid panel (Completed)   TSH (Completed)   Comprehensive metabolic panel (Completed)   VITAMIN D 25 Hydroxy (Vit-D Deficiency, Fractures) (Completed)   CAD (coronary artery disease)   Relevant Medications   hydrochlorothiazide (MICROZIDE) 12.5 MG capsule   propranolol (INDERAL) 20 MG tablet     Endocrine   Hypothyroidism (Chronic)   Relevant Medications   propranolol (INDERAL) 20 MG tablet   Other Relevant Orders   CBC (Completed)   Hemoglobin A1c (Completed)   Lipid panel (Completed)   TSH (Completed)   Comprehensive metabolic panel (Completed)   VITAMIN D 25 Hydroxy (Vit-D Deficiency, Fractures) (Completed)     Other   Gout (Chronic)   Relevant Medications   allopurinol (ZYLOPRIM) 300 MG tablet   Prediabetes (Chronic)   Relevant Orders   CBC (Completed)   Hemoglobin A1c (Completed)   Lipid panel (Completed)   TSH (Completed)   Comprehensive metabolic panel (Completed)   VITAMIN D 25 Hydroxy (Vit-D Deficiency, Fractures) (Completed)   Vitamin D deficiency (Chronic)   Relevant Orders   CBC (Completed)   Hemoglobin A1c (Completed)   Lipid panel (Completed)   TSH (Completed)   Comprehensive metabolic panel (Completed)   VITAMIN D 25 Hydroxy (Vit-D Deficiency, Fractures) (Completed)  Stress-related physiological response affecting physical condition   Relevant Medications   propranolol (INDERAL) 20 MG tablet   Mood disorder (HCC)   Hyperlipidemia   Relevant Medications   hydrochlorothiazide (MICROZIDE) 12.5 MG capsule   propranolol (INDERAL) 20 MG tablet   Other Relevant Orders   CBC (Completed)   Hemoglobin A1c (Completed)   Lipid panel (Completed)   TSH (Completed)   Comprehensive metabolic panel (Completed)   VITAMIN D 25 Hydroxy (Vit-D Deficiency,  Fractures) (Completed)     Hypertension: -BP at goal. -Continue current medication regimen.  Provided refills. -Follow a low-sodium diet and stay well-hydrated. -Checking CMP today for medication monitoring.  Hyperlipidemia: -Last lipid panel: Total cholesterol 168, triglycerides 247, HDL 46, LDL 81 -Continue [place].  Stay as active as possible. -Follow a heart healthy diet low in saturated and transfats, monitor simple carbohydrates. -Will repeat lipid panel.  CAD: -Followed by cardiology.  Mood disorder: -PHQ-9 score of 0. -Continue to follow-up with behavioral health. -Continue current medication regimen.  Hypothyroid: -Last TSH and free T4 WNL -Continue current medication regimen. -Will repeat TSH today.  Idiopathic gout: -Well-controlled. Continue to avoid/limit high purine foods such as seafood, red meat, and high fructose corn syrup.  -Continue current medication regimen.  Provided refill.  -Last renal function stable. Will repeat CMP today.  Prediabetes: -Last A1c 5.2, WNL -Recommend to follow a low carbohydrate and glucose diet. -Will repeat A1c today.  Vitamin D deficiency: -Last vitamin D 43.2, WNL -Continue vitamin D3 supplement. -Will repeat vitamin D today.  Meds ordered this encounter  Medications  . allopurinol (ZYLOPRIM) 300 MG tablet    Sig: Take 1 tablet (300 mg total) by mouth daily.    Dispense:  90 tablet    Refill:  1    Order Specific Question:   Supervising Provider    Answer:   Beatrice Lecher D [[id number]]  . hydrochlorothiazide (MICROZIDE) 12.5 MG capsule    Sig: Take 1 capsule (12.5 mg total) by mouth daily.    Dispense:  90 capsule    Refill:  1    Order Specific Question:   Supervising Provider    Answer:   Beatrice Lecher D [[id number]]  . propranolol (INDERAL) 20 MG tablet    Sig: Take 1 tablet by mouth 4 times daily    Dispense:  360 tablet    Refill:  1    Order Specific Question:   Supervising Provider    Answer:    Beatrice Lecher D [[id number]]    Follow-up: Return in about 4 months (around 12/05/2020) for HTN, HLD, thyroid.   Note:  This note was prepared with assistance of Dragon voice recognition software. Occasional wrong-word or sound-a-like substitutions may have occurred due to the inherent limitations of voice recognition software.  [name], PA-C

## 2020-08-07 NOTE — Patient Instructions (Signed)
Hypertension, Adult High blood pressure (hypertension) is when the force of blood pumping through the arteries is too strong. The arteries are the blood vessels that carry blood from the heart throughout the body. Hypertension forces the heart to work harder to pump blood and may cause arteries to become narrow or stiff. Untreated or uncontrolled hypertension can cause a heart attack, heart failure, a stroke, kidney disease, and other problems. A blood pressure reading consists of a higher number over a lower number. Ideally, your blood pressure should be below 120/80. The first ("top") number is called the systolic pressure. It is a measure of the pressure in your arteries as your heart beats. The second ("bottom") number is called the diastolic pressure. It is a measure of the pressure in your arteries as the heart relaxes. What are the causes? The exact cause of this condition is not known. There are some conditions that result in or are related to high blood pressure. What increases the risk? Some risk factors for high blood pressure are under your control. The following factors may make you more likely to develop this condition:  Smoking.  Having type 2 diabetes mellitus, high cholesterol, or both.  Not getting enough exercise or physical activity.  Being overweight.  Having too much fat, sugar, calories, or salt (sodium) in your diet.  Drinking too much alcohol. Some risk factors for high blood pressure may be difficult or impossible to change. Some of these factors include:  Having chronic kidney disease.  Having a family history of high blood pressure.  Age. Risk increases with age.  Race. You may be at higher risk if you are African American.  Gender. Men are at higher risk than women before age 35. After age 69, women are at higher risk than men.  Having obstructive sleep apnea.  Stress. What are the signs or symptoms? High blood pressure may not cause symptoms. Very high  blood pressure (hypertensive crisis) may cause:  Headache.  Anxiety.  Shortness of breath.  Nosebleed.  Nausea and vomiting.  Vision changes.  Severe chest pain.  Seizures. How is this diagnosed? This condition is diagnosed by measuring your blood pressure while you are seated, with your arm resting on a flat surface, your legs uncrossed, and your feet flat on the floor. The cuff of the blood pressure monitor will be placed directly against the skin of your upper arm at the level of your heart. It should be measured at least twice using the same arm. Certain conditions can cause a difference in blood pressure between your right and left arms. Certain factors can cause blood pressure readings to be lower or higher than normal for a short period of time:  When your blood pressure is higher when you are in a health care provider's office than when you are at home, this is called white coat hypertension. Most people with this condition do not need medicines.  When your blood pressure is higher at home than when you are in a health care provider's office, this is called masked hypertension. Most people with this condition may need medicines to control blood pressure. If you have a high blood pressure reading during one visit or you have normal blood pressure with other risk factors, you may be asked to:  Return on a different day to have your blood pressure checked again.  Monitor your blood pressure at home for 1 week or longer. If you are diagnosed with hypertension, you may have other blood or  imaging tests to help your health care provider understand your overall risk for other conditions. How is this treated? This condition is treated by making healthy lifestyle changes, such as eating healthy foods, exercising more, and reducing your alcohol intake. Your health care provider may prescribe medicine if lifestyle changes are not enough to get your blood pressure under control, and  if:  Your systolic blood pressure is above 130.  Your diastolic blood pressure is above 80. Your personal target blood pressure may vary depending on your medical conditions, your age, and other factors. Follow these instructions at home: Eating and drinking   Eat a diet that is high in fiber and potassium, and low in sodium, added sugar, and fat. An example eating plan is called the DASH (Dietary Approaches to Stop Hypertension) diet. To eat this way: ? Eat plenty of fresh fruits and vegetables. Try to fill one half of your plate at each meal with fruits and vegetables. ? Eat whole grains, such as whole-wheat pasta, brown rice, or whole-grain bread. Fill about one fourth of your plate with whole grains. ? Eat or drink low-fat dairy products, such as skim milk or low-fat yogurt. ? Avoid fatty cuts of meat, processed or cured meats, and poultry with skin. Fill about one fourth of your plate with lean proteins, such as fish, chicken without skin, beans, eggs, or tofu. ? Avoid pre-made and processed foods. These tend to be higher in sodium, added sugar, and fat.  Reduce your daily sodium intake. Most people with hypertension should eat less than 1,500 mg of sodium a day.  Do not drink alcohol if: ? Your health care provider tells you not to drink. ? You are pregnant, may be pregnant, or are planning to become pregnant.  If you drink alcohol: ? Limit how much you use to:  0-1 drink a day for women.  0-2 drinks a day for men. ? Be aware of how much alcohol is in your drink. In the [place] one drink equals one 12 oz bottle of beer (355 mL), one 5 oz glass of wine (148 mL), or one 1 oz glass of hard liquor (44 mL). Lifestyle   Work with your health care provider to maintain a healthy body weight or to lose weight. Ask what an ideal weight is for you.  Get at least 30 minutes of exercise most days of the week. Activities may include walking, swimming, or biking.  Include exercise to  strengthen your muscles (resistance exercise), such as Pilates or lifting weights, as part of your weekly exercise routine. Try to do these types of exercises for 30 minutes at least 3 days a week.  Do not use any products that contain nicotine or tobacco, such as cigarettes, e-cigarettes, and chewing tobacco. If you need help quitting, ask your health care provider.  Monitor your blood pressure at home as told by your health care provider.  Keep all follow-up visits as told by your health care provider. This is important. Medicines  Take over-the-counter and prescription medicines only as told by your health care provider. Follow directions carefully. Blood pressure medicines must be taken as prescribed.  Do not skip doses of blood pressure medicine. Doing this puts you at risk for problems and can make the medicine less effective.  Ask your health care provider about side effects or reactions to medicines that you should watch for. Contact a health care provider if you:  Think you are having a reaction to a medicine you  are taking.  Have headaches that keep coming back (recurring).  Feel dizzy.  Have swelling in your ankles.  Have trouble with your vision. Get help right away if you:  Develop a severe headache or confusion.  Have unusual weakness or numbness.  Feel faint.  Have severe pain in your chest or abdomen.  Vomit repeatedly.  Have trouble breathing. Summary  Hypertension is when the force of blood pumping through your arteries is too strong. If this condition is not controlled, it may put you at risk for serious complications.  Your personal target blood pressure may vary depending on your medical conditions, your age, and other factors. For most people, a normal blood pressure is less than 120/80.  Hypertension is treated with lifestyle changes, medicines, or a combination of both. Lifestyle changes include losing weight, eating a healthy, low-sodium diet,  exercising more, and limiting alcohol. This information is not intended to replace advice given to you by your health care provider. Make sure you discuss any questions you have with your health care provider. Document Revised: 05/27/2018 Document Reviewed: 05/27/2018 Elsevier Patient Education  [place]. High Cholesterol  High cholesterol is a condition in which the blood has high levels of a white, waxy, fat-like substance (cholesterol). The human body needs small amounts of cholesterol. The liver makes all the cholesterol that the body needs. Extra (excess) cholesterol comes from the food that we eat. Cholesterol is carried from the liver by the blood through the blood vessels. If you have high cholesterol, deposits (plaques) may build up on the walls of your blood vessels (arteries). Plaques make the arteries narrower and stiffer. Cholesterol plaques increase your risk for heart attack and stroke. Work with your health care provider to keep your cholesterol levels in a healthy range. What increases the risk? This condition is more likely to develop in people who:  Eat foods that are high in animal fat (saturated fat) or cholesterol.  Are overweight.  Are not getting enough exercise.  Have a family history of high cholesterol. What are the signs or symptoms? There are no symptoms of this condition. How is this diagnosed? This condition may be diagnosed from the results of a blood test.  If you are older than age 2, your health care provider may check your cholesterol every 4-6 years.  You may be checked more often if you already have high cholesterol or other risk factors for heart disease. The blood test for cholesterol measures:  "Bad" cholesterol (LDL cholesterol). This is the main type of cholesterol that causes heart disease. The desired level for LDL is less than 100.  "Good" cholesterol (HDL cholesterol). This type helps to protect against heart disease by cleaning  the arteries and carrying the LDL away. The desired level for HDL is 60 or higher.  Triglycerides. These are fats that the body can store or burn for energy. The desired number for triglycerides is lower than 150.  Total cholesterol. This is a measure of the total amount of cholesterol in your blood, including LDL cholesterol, HDL cholesterol, and triglycerides. A healthy number is less than 200. How is this treated? This condition is treated with diet changes, lifestyle changes, and medicines. Diet changes  This may include eating more whole grains, fruits, vegetables, nuts, and fish.  This may also include cutting back on red meat and foods that have a lot of added sugar. Lifestyle changes  Changes may include getting at least 40 minutes of aerobic exercise 3 times  a week. Aerobic exercises include walking, biking, and swimming. Aerobic exercise along with a healthy diet can help you maintain a healthy weight.  Changes may also include quitting smoking. Medicines  Medicines are usually given if diet and lifestyle changes have failed to reduce your cholesterol to healthy levels.  Your health care provider may prescribe a statin medicine. Statin medicines have been shown to reduce cholesterol, which can reduce the risk of heart disease. Follow these instructions at home: Eating and drinking If told by your health care provider:  Eat chicken (without skin), fish, veal, shellfish, ground [place] breast, and round or loin cuts of red meat.  Do not eat fried foods or fatty meats, such as hot dogs and salami.  Eat plenty of fruits, such as apples.  Eat plenty of vegetables, such as broccoli, potatoes, and carrots.  Eat beans, peas, and lentils.  Eat grains such as barley, rice, couscous, and bulgur wheat.  Eat pasta without cream sauces.  Use skim or nonfat milk, and eat low-fat or nonfat yogurt and cheeses.  Do not eat or drink whole milk, cream, ice cream, egg yolks, or hard  cheeses.  Do not eat stick margarine or tub margarines that contain trans fats (also called partially hydrogenated oils).  Do not eat saturated tropical oils, such as coconut oil and palm oil.  Do not eat cakes, cookies, crackers, or other baked goods that contain trans fats.  General instructions  Exercise as directed by your health care provider. Increase your activity level with activities such as gardening, walking, and taking the stairs.  Take over-the-counter and prescription medicines only as told by your health care provider.  Do not use any products that contain nicotine or tobacco, such as cigarettes and e-cigarettes. If you need help quitting, ask your health care provider.  Keep all follow-up visits as told by your health care provider. This is important. Contact a health care provider if:  You are struggling to maintain a healthy diet or weight.  You need help to start on an exercise program.  You need help to stop smoking. Get help right away if:  You have chest pain.  You have trouble breathing. This information is not intended to replace advice given to you by your health care provider. Make sure you discuss any questions you have with your health care provider. Document Revised: 09/19/2017 Document Reviewed: 03/16/2016 Elsevier Patient Education  [place].

## 2020-08-08 LAB — TSH: TSH: 1.45 u[IU]/mL (ref 0.450–4.500)

## 2020-08-08 LAB — COMPREHENSIVE METABOLIC PANEL
ALT: 20 IU/L (ref 0–44)
AST: 15 IU/L (ref 0–40)
Albumin/Globulin Ratio: 2.2 (ref 1.2–2.2)
Albumin: 4.4 g/dL (ref 3.8–4.9)
Alkaline Phosphatase: 58 IU/L (ref 44–121)
BUN/Creatinine Ratio: 17 (ref 9–20)
BUN: 12 mg/dL (ref 6–24)
Bilirubin Total: <0.2 mg/dL (ref 0.0–1.2)
CO2: 28 mmol/L (ref 20–29)
Calcium: 9 mg/dL (ref 8.7–10.2)
Chloride: 99 mmol/L (ref 96–106)
Creatinine, Ser: 0.71 mg/dL — ABNORMAL LOW (ref 0.76–1.27)
GFR calc Af Amer: 121 mL/min/{1.73_m2} (ref >59)
GFR calc non Af Amer: 105 mL/min/{1.73_m2} (ref >59)
Globulin, Total: 2 g/dL (ref 1.5–4.5)
Glucose: 95 mg/dL (ref 65–99)
Potassium: 4.8 mmol/L (ref 3.5–5.2)
Sodium: 140 mmol/L (ref 134–144)
Total Protein: 6.4 g/dL (ref 6.0–8.5)

## 2020-08-08 LAB — LIPID PANEL
Chol/HDL Ratio: 3.2 ratio (ref 0.0–5.0)
Cholesterol, Total: 190 mg/dL (ref 100–199)
HDL: 59 mg/dL (ref >39)
LDL Chol Calc (NIH): 94 mg/dL (ref 0–99)
Triglycerides: 218 mg/dL — ABNORMAL HIGH (ref 0–149)
VLDL Cholesterol Cal: 37 mg/dL (ref 5–40)

## 2020-08-08 LAB — HEMOGLOBIN A1C
Est. average glucose Bld gHb Est-mCnc: 114 mg/dL
Hgb A1c MFr Bld: 5.6 % (ref 4.8–5.6)

## 2020-08-08 LAB — CBC
Hematocrit: 43.5 % (ref 37.5–51.0)
Hemoglobin: 14.8 g/dL (ref 13.0–17.7)
MCH: 32.1 pg (ref 26.6–33.0)
MCHC: 34 g/dL (ref 31.5–35.7)
MCV: 94 fL (ref 79–97)
Platelets: 209 10*3/uL (ref 150–450)
RBC: 4.61 x10E6/uL (ref 4.14–5.80)
RDW: 11.9 % (ref 11.6–15.4)
WBC: 6.3 10*3/uL (ref 3.4–10.8)

## 2020-08-08 LAB — VITAMIN D 25 HYDROXY (VIT D DEFICIENCY, FRACTURES): Vit D, 25-Hydroxy: 35 ng/mL (ref 30.0–100.0)

## 2020-08-11 ENCOUNTER — Telehealth: Payer: Self-pay | Admitting: Psychiatry

## 2020-08-11 ENCOUNTER — Other Ambulatory Visit: Payer: Self-pay

## 2020-08-11 DIAGNOSIS — F32A Depression, unspecified: Secondary | ICD-10-CM

## 2020-08-11 DIAGNOSIS — F411 Generalized anxiety disorder: Secondary | ICD-10-CM

## 2020-08-11 DIAGNOSIS — F410 Panic disorder [episodic paroxysmal anxiety] without agoraphobia: Secondary | ICD-10-CM

## 2020-08-11 MED ORDER — HYDROCHLOROTHIAZIDE 12.5 MG PO CAPS: 12.5000 mg | ORAL_CAPSULE | Freq: Every day | ORAL | 1 refills | Status: DC

## 2020-08-11 MED ORDER — ALLOPURINOL 300 MG PO TABS: 300.0000 mg | ORAL_TABLET | Freq: Every day | ORAL | 1 refills | Status: DC

## 2020-08-11 MED ORDER — PROPRANOLOL HCL 20 MG PO TABS: ORAL_TABLET | ORAL | 1 refills | Status: DC

## 2020-08-11 MED ORDER — SERTRALINE HCL 100 MG PO TABS: 200.0000 mg | ORAL_TABLET | Freq: Every day | ORAL | 2 refills | Status: DC

## 2020-08-11 NOTE — Telephone Encounter (Signed)
Pt called requesting refill for Sertraline 100 mg 2/d @ [facility]  [place]. Apt 09/30/20 and on canc list

## 2020-08-11 NOTE — Telephone Encounter (Signed)
Rx refills sent for Sertraline, enough to get to apt 09/2020

## 2020-08-14 ENCOUNTER — Encounter: Payer: Self-pay | Admitting: Physician Assistant

## 2020-09-04 ENCOUNTER — Other Ambulatory Visit: Payer: Self-pay | Admitting: Physician Assistant

## 2020-09-04 DIAGNOSIS — I10 Essential (primary) hypertension: Secondary | ICD-10-CM

## 2020-09-19 ENCOUNTER — Ambulatory Visit: Payer: BC Managed Care – PPO | Admitting: Physician Assistant

## 2020-09-19 ENCOUNTER — Encounter: Payer: Self-pay | Admitting: Physician Assistant

## 2020-09-19 ENCOUNTER — Other Ambulatory Visit: Payer: Self-pay

## 2020-09-19 VITALS — BP 129/71 | HR 86 | Ht 67.5 in | Wt 242.4 lb

## 2020-09-19 DIAGNOSIS — R2242 Localized swelling, mass and lump, left lower limb: Secondary | ICD-10-CM

## 2020-09-19 NOTE — Patient Instructions (Signed)
Baker Cyst  A Baker cyst, also called a popliteal cyst, is a growth that forms at the back of the knee. The cyst forms when the fluid-filled sac (bursa) that cushions the knee joint becomes enlarged. What are the causes? In most cases, a Baker cyst results from another knee problem that causes swelling inside the knee. This makes the fluid inside the knee joint (synovial fluid) flow into the bursa behind the knee, causing the bursa to enlarge. What increases the risk? You may be more likely to develop a Baker cyst if you already have a knee problem, such as:  A tear in cartilage that cushions the knee joint (meniscal tear).  A tear in the tissues that connect the bones of the knee joint (ligament tear).  Knee swelling from osteoarthritis, rheumatoid arthritis, or gout. What are the signs or symptoms? The main symptom of this condition is a lump behind the knee. This may be the only symptom of the condition. The lump may be painful, especially when the knee is straightened. If the lump is painful, the pain may come and go. The knee may also be stiff. Symptoms may quickly get more severe if the cyst breaks open (ruptures). If the cyst ruptures, you may feel the following in your knee and calf:  Sudden or worsening pain.  Swelling.  Bruising.  Redness in the calf. A Baker cyst does not always cause symptoms. How is this diagnosed? This condition may be diagnosed based on your symptoms and medical history. Your health care provider will also do a physical exam. This may include:  Feeling the cyst to check whether it is tender.  Checking your knee for signs of another knee condition that causes swelling. You may have imaging tests, such as:  X-rays.  MRI.  Ultrasound. How is this treated? A Baker cyst that is not painful may go away without treatment. If the cyst gets large or painful, it will likely get better if the underlying knee problem is treated. If needed, treatment for a  Baker cyst may include:  Resting.  Keeping weight off of the knee. This means not leaning on the knee to support your body weight.  Taking NSAIDs, such as ibuprofen, to reduce pain and swelling.  Having a procedure to drain the fluid from the cyst with a needle (aspiration). You may also get an injection of a medicine that reduces swelling (steroid).  Having surgery. This may be needed if other treatments do not work. This usually involves correcting knee damage and removing the cyst. Follow these instructions at home:  Activity  Rest as told by your health care provider.  Avoid activities that make pain or swelling worse.  Return to your normal activities as told by your health care provider. Ask your health care provider what activities are safe for you.  Do not use the injured limb to support your body weight until your health care provider says that you can. Use crutches as told by your health care provider. General instructions  Take over-the-counter and prescription medicines only as told by your health care provider.  Keep all follow-up visits as told by your health care provider. This is important. Contact a health care provider if:  You have knee pain, stiffness, or swelling that does not get better. Get help right away if:  You have sudden or worsening pain and swelling in your calf area. Summary  A Baker cyst, also called a popliteal cyst, is a growth that forms at the  back of the knee.  In most cases, a Baker cyst results from another knee problem that causes swelling inside the knee.  A Baker cyst that is not painful may go away without treatment.  If needed, treatment for a Baker cyst may include resting, keeping weight off of the knee, medicines, or draining fluid from the cyst.  Surgery may be needed if other treatments are not effective. This information is not intended to replace advice given to you by your health care provider. Make sure you discuss any  questions you have with your health care provider. Document Revised: 01/29/2019 Document Reviewed: 01/29/2019 Elsevier Patient Education  [place].

## 2020-09-19 NOTE — Progress Notes (Signed)
Acute Office Visit  Subjective:    Patient ID: Ralph Mack, male    DOB: 1966/09/25, 54 y.o.   MRN: [id number]  Chief Complaint  Patient presents with  . Cyst    HPI Patient is in today for lump behind left knee for several weeks.  Denies redness, recent injury, fever, chest pain, shortness of breath, increased swelling from baseline, or palpitations.  Does report having muscle spasms.  Past Medical History:  Diagnosis Date  . Allergy   . Arthritis   . Hx of colonic polyp 11/13/2016   10/2016 7 mm ssp/a - recall 2023  . Hypothyroidism   . Lymphocytic colitis 04/19/2019  . Prediabetes   . Recurrent upper respiratory infection (URI)     Past Surgical History:  Procedure Laterality Date  . COLONOSCOPY  2018  . CORONARY ANGIOGRAM  2009    Family History  Problem Relation Age of Onset  . Asthma Mother   . COPD Mother   . Cancer Mother        Skin  . Cancer - Other Mother        bone, liver, breast, lung  . Emphysema Mother   . Heart disease Father        Pacemaker  . Parkinson's disease Father   . Depression Father   . Depression Daughter   . OCD Daughter   . Alcohol abuse Maternal Grandmother   . Schizophrenia Maternal Grandmother   . Diabetes Paternal Grandfather   . Kidney disease Paternal Grandfather   . Colon polyps Neg Hx   . Colon cancer Neg Hx   . Esophageal cancer Neg Hx   . Stomach cancer Neg Hx   . Rectal cancer Neg Hx     Social History   Socioeconomic History  . Marital status: Married    Spouse name: Not on file  . Number of children: 2  . Years of education: Not on file  . Highest education level: Not on file  Occupational History  . Occupation: truck Publishing rights manager  Tobacco Use  . Smoking status: Current Every Day Smoker    Packs/day: 0.50    Years: 37.00    Pack years: 18.50    Types: Cigarettes  . Smokeless tobacco: Never Used  Vaping Use  . Vaping Use: Former  Substance and Sexual Activity  . Alcohol use: Yes     Alcohol/week: 12.0 standard drinks    Types: 12 Cans of beer per week  . Drug use: No  . Sexual activity: Not on file  Other Topics Concern  . 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     Social Determinants of Health   Financial Resource Strain: Not on file  Food Insecurity: Not on file  Transportation Needs: Not on file  Physical Activity: Not on file  Stress: Not on file  Social Connections: Not on file  Intimate Partner Violence: Not on file    Outpatient Medications Prior to Visit  Medication Sig Dispense Refill  . allopurinol (ZYLOPRIM) 300 MG tablet Take 1 tablet (300 mg total) by mouth daily. 90 tablet 1  . amLODipine (NORVASC) 5 MG tablet Take 1 tablet by mouth once daily 90 tablet 3  . Cholecalciferol (VITAMIN D3) 5000 units TABS 5,000 IU OTC vitamin D3 daily. 90 tablet 3  . Coenzyme Q10 (CO Q 10) 100 MG CAPS Take 3 capsules by mouth daily.    Marland Kitchen dicyclomine (BENTYL) 20 MG tablet TAKE  1 TABLET BY MOUTH 4 TIMES DAILY BEFORE MEAL(S) AND AT BEDTIME AS NEEDED 360 tablet 0  . fexofenadine (ALLEGRA) 180 MG tablet Take 180 mg by mouth daily.    . fluticasone (FLONASE) 50 MCG/ACT nasal spray Place 2 sprays into the nose daily.    . hydrochlorothiazide (MICROZIDE) 12.5 MG capsule Take 1 capsule (12.5 mg total) by mouth daily. 90 capsule 1  . hydrocortisone (ANUSOL-HC) 2.5 % rectal cream Place 1 application rectally 2 (two) times daily. 30 g 0  . levothyroxine (SYNTHROID) 50 MCG tablet Take 1 tablet by mouth once daily 90 tablet 0  . losartan (COZAAR) 50 MG tablet Take 1 tablet by mouth once daily 90 tablet 0  . Multiple Vitamins-Minerals (SUPER MEGA VITE 75/BETA CARO PO) Take by mouth.    . omega-3 acid ethyl esters (LOVAZA) 1 g capsule Take 2 capsules (2 g total) by mouth 2 (two) times daily. 180 capsule 3  . Potassium 99 MG TABS Take 3-4 tablets by mouth daily.    . propranolol (INDERAL) 20 MG tablet Take 1 tablet by mouth 4 times  daily 360 tablet 1  . sertraline (ZOLOFT) 100 MG tablet Take 2 tablets (200 mg total) by mouth daily. 60 tablet 2  . tamsulosin (FLOMAX) 0.4 MG CAPS capsule Take 1 capsule (0.4 mg total) by mouth daily. 30 capsule 3  . Zinc 50 MG CAPS Take 1 capsule by mouth daily.     No facility-administered medications prior to visit.    No Known Allergies  Review of Systems A fourteen system review of systems was performed and found to be positive as per HPI.  Objective:    Physical Exam General:  Well Developed, well nourished, appropriate for stated age.  Neuro:  Alert and oriented,  extra-ocular muscles intact  HEENT:  Normocephalic, atraumatic, neck supple, no carotid bruits appreciated  Skin:  no gross rash, warm, pink. Cardiac:  RRR, S1 S2 Respiratory:  ECTA B/L and A/P, Not using accessory muscles, speaking in full sentences- unlabored. Vascular:  Ext warm, no cyanosis apprec. MSK: Firm mass of posterior knee at popliteal fossa without erythema or significant tenderness. Psych:  No HI/SI, judgement and insight good, Euthymic mood. Full Affect.   BP 129/71   Pulse 86   Ht 5' 7.5" (1.715 m)   Wt 242 lb 6.4 oz (110 kg)   SpO2 99%   BMI 37.40 kg/m  Wt Readings from Last 3 Encounters:  09/19/20 242 lb 6.4 oz (110 kg)  08/07/20 242 lb 6.4 oz (110 kg)  06/06/20 240 lb (108.9 kg)    Health Maintenance Due  Topic Date Due  . COVID-19 Vaccine (1) Never done  . TETANUS/TDAP  Never done  . INFLUENZA VACCINE  04/30/2020    There are no preventive care reminders to display for this patient.   Lab Results  Component Value Date   TSH 1.450 08/07/2020   Lab Results  Component Value Date   WBC 6.3 08/07/2020   HGB 14.8 08/07/2020   HCT 43.5 08/07/2020   MCV 94 08/07/2020   PLT 209 08/07/2020   Lab Results  Component Value Date   NA 140 08/07/2020   K 4.8 08/07/2020   CO2 28 08/07/2020   GLUCOSE 95 08/07/2020   BUN 12 08/07/2020   CREATININE 0.71 (L) 08/07/2020   BILITOT  <0.2 08/07/2020   ALKPHOS 58 08/07/2020   AST 15 08/07/2020   ALT 20 08/07/2020   PROT 6.4 08/07/2020   ALBUMIN 4.4  08/07/2020   CALCIUM 9.0 08/07/2020   Lab Results  Component Value Date   CHOL 190 08/07/2020   Lab Results  Component Value Date   HDL 59 08/07/2020   Lab Results  Component Value Date   LDLCALC 94 08/07/2020   Lab Results  Component Value Date   TRIG 218 (H) 08/07/2020   Lab Results  Component Value Date   CHOLHDL 3.2 08/07/2020   Lab Results  Component Value Date   HGBA1C 5.6 08/07/2020       Assessment & Plan:   Problem List Items Addressed This Visit   None   Visit Diagnoses    Mass of left knee    -  Primary   Relevant Orders   VAS [place] LOWER EXTREMITY VENOUS (DVT)     Mass of left knee: -Discussed with patient most likely Baker's cyst and recommend duplex ultrasound. Patient is agreeable. -Recommend conservative therapy. -If symptoms fail to improve or worsen recommend referral to Orthopedics.    No orders of the defined types were placed in this encounter.    [name], PA-C

## 2020-09-25 ENCOUNTER — Ambulatory Visit (HOSPITAL_COMMUNITY)
Admission: RE | Admit: 2020-09-25 | Discharge: 2020-09-25 | Disposition: A | Discharge status: Home / Self Care | Payer: BC Managed Care – PPO | Source: Ambulatory Visit | Attending: Physician Assistant | Admitting: Physician Assistant

## 2020-09-25 ENCOUNTER — Other Ambulatory Visit: Payer: Self-pay

## 2020-09-25 DIAGNOSIS — R2242 Localized swelling, mass and lump, left lower limb: Secondary | ICD-10-CM | POA: Diagnosis not present

## 2020-10-03 ENCOUNTER — Other Ambulatory Visit: Payer: Self-pay

## 2020-10-03 ENCOUNTER — Encounter: Payer: Self-pay | Admitting: Psychiatry

## 2020-10-03 ENCOUNTER — Ambulatory Visit (INDEPENDENT_AMBULATORY_CARE_PROVIDER_SITE_OTHER): Payer: BC Managed Care – PPO | Admitting: Psychiatry

## 2020-10-03 DIAGNOSIS — F410 Panic disorder [episodic paroxysmal anxiety] without agoraphobia: Secondary | ICD-10-CM

## 2020-10-03 DIAGNOSIS — F32A Depression, unspecified: Secondary | ICD-10-CM

## 2020-10-03 DIAGNOSIS — F411 Generalized anxiety disorder: Secondary | ICD-10-CM | POA: Diagnosis not present

## 2020-10-03 MED ORDER — SERTRALINE HCL 100 MG PO TABS: ORAL_TABLET | ORAL | 2 refills | Status: DC

## 2020-10-03 MED ORDER — VIIBRYD 40 MG PO TABS: 40.0000 mg | ORAL_TABLET | Freq: Every day | ORAL | 0 refills | Status: DC

## 2020-10-03 NOTE — Progress Notes (Signed)
Ralph Mack [id number] [birth year] 55 y.o.  Subjective:   Patient ID:  Ralph Mack is a 55 y.o. (DOB 1966-01-01) male.  Chief Complaint:  Chief Complaint  Patient presents with   Anxiety   Insomnia   Depression    HPI Ralph Mack presents to the office today for follow-up of depression, anxiety, and insomnia. Ralph Mack reports that that they have had increased stress with new house. Ralph Mack reports that the house had several issues on home inspection. Ralph Mack reports that repairs were made by owners and after they bought it there were significant repairs that needed to be done. Ralph Mack then learned that his work may want him to relocate to [place].   Mother-in-law is living nearby with sister-in-law and Ralph Mack and his wife may need to assume care of sister-in-law with special needs when mother in-law is not able to care for her.  Ralph Mack reports that Ralph Mack has been having "little mini-panic attacks" in response to stressors. Ralph Mack reports that Ralph Mack has difficulty sleeping on Tuesday night prior to the start of his work week starting on Wednesday. Ralph Mack takes an OTC sleep aid to be able to fall asleep. Ralph Mack reports that Ralph Mack has been needing to take frequent naps. Energy has been low. Motivation has been low and reports that Ralph Mack will put things off. Ralph Mack reports that his mood is lower when weather is not good. Concentration has been impaired. Ralph Mack reports difficulty completing things. Ralph Mack reports that his wife tells him that Ralph Mack forgets things that they have already discussed. Ralph Mack reports appetite has been increased and has gained 40 lbs. Denies SI.  Continues to grieve the loss of his parents. Ralph Mack reports that Ralph Mack has some anxiety about his own mortality and thinking about what could happen to him. Ralph Mack reports frequent worry and rumination. Ralph Mack reports frequently thinking about the future.   Wife, mother-in-law, and sister-in-law had COVID. Working 60-70 hours a week.   Past Psychiatric Medication  Trials: Prozac-ineffective Sertraline Alprazolam- effective Propranolol-ineffective  GAD-7   Flowsheet Row Office Visit from 03/22/2019 in [facility] at [facility] Visit from 01/06/2019 in [facility] Primary Care at [facility] Visit from 10/05/2018 in [facility] Care at [facility] Visit from 08/25/2018 in [facility] Primary Care at [facility] Visit from 08/17/2018 in [facility] at [facility]  Total GAD-7 Score 0 1 15 14 14     PHQ2-9   Flowsheet Row Office Visit from 09/19/2020 in [facility] at [facility] Visit from 08/07/2020 in [facility] Primary Care at [facility] Visit from 10/25/2019 in [facility] Care at [facility] Visit from 06/28/2019 in [facility] Primary Care at [facility] Visit from 05/25/2019 in [facility] at [facility]  PHQ-2 Total Score 0 0 0 0 0  PHQ-9 Total Score 0 0 0 0 1       Review of Systems:  Review of Systems  Musculoskeletal: Positive for arthralgias. Negative for gait problem.  Neurological: Negative for tremors.       Ralph Mack reports that Ralph Mack will suddenly jolt awake.  Psychiatric/Behavioral:       Please refer to HPI    Medications: I have reviewed the patient's current medications.  Current Outpatient Medications  Medication Sig Dispense Refill   allopurinol (ZYLOPRIM) 300 MG tablet Take 1 tablet (300 mg total) by mouth daily. 90 tablet 1   amLODipine (NORVASC) 5 MG  tablet Take 1 tablet by mouth once daily 90 tablet 3   Cholecalciferol (VITAMIN D3) 5000 units TABS 5,000 IU OTC vitamin D3 daily. 90 tablet 3   Coenzyme Q10 (CO Q 10) 100 MG CAPS Take 3 capsules by mouth daily.     dicyclomine (BENTYL) 20 MG tablet TAKE 1 TABLET BY MOUTH 4 TIMES DAILY BEFORE MEAL(S) AND AT BEDTIME AS NEEDED 360 tablet 0   diphenhydrAMINE HCl, Sleep, 25 MG CAPS Take by mouth. Takes 1-2 tabs po QHS     fexofenadine (ALLEGRA) 180 MG tablet Take  180 mg by mouth daily.     fluticasone (FLONASE) 50 MCG/ACT nasal spray Place 2 sprays into the nose daily.     hydrochlorothiazide (MICROZIDE) 12.5 MG capsule Take 1 capsule (12.5 mg total) by mouth daily. 90 capsule 1   levothyroxine (SYNTHROID) 50 MCG tablet Take 1 tablet by mouth once daily 90 tablet 0   losartan (COZAAR) 50 MG tablet Take 1 tablet by mouth once daily 90 tablet 0   Multiple Vitamins-Minerals (SUPER MEGA VITE 75/BETA CARO PO) Take by mouth.     omega-3 acid ethyl esters (LOVAZA) 1 g capsule Take 2 capsules (2 g total) by mouth 2 (two) times daily. 180 capsule 3   Potassium 99 MG TABS Take 3-4 tablets by mouth daily.     propranolol (INDERAL) 20 MG tablet Take 1 tablet by mouth 4 times daily (Patient taking differently: 20 mg. Take 1 tablet by mouth 4 times daily prn) 360 tablet 1   tamsulosin (FLOMAX) 0.4 MG CAPS capsule Take 1 capsule (0.4 mg total) by mouth daily. 30 capsule 3   Vilazodone HCl (VIIBRYD) 40 MG TABS Take 1 tablet (40 mg total) by mouth daily. 90 tablet 0   hydrocortisone (ANUSOL-HC) 2.5 % rectal cream Place 1 application rectally 2 (two) times daily. 30 g 0   sertraline (ZOLOFT) 100 MG tablet Take 1.5 tablets daily for one week, then decrease to 1 tablet daily for one week, then decrease to 1/2 tablet daily for one week, then stop. 60 tablet 2   Zinc 50 MG CAPS Take 1 capsule by mouth daily.     No current facility-administered medications for this visit.    Medication Side Effects: Other: Sexual side effects  Allergies: No Known Allergies  Past Medical History:  Diagnosis Date   Allergy    Arthritis    Hx of colonic polyp 11/13/2016   10/2016 7 mm ssp/a - recall 2023   Hypothyroidism    Lymphocytic colitis 04/19/2019   Prediabetes    Recurrent upper respiratory infection (URI)     Family History  Problem Relation Age of Onset   Asthma Mother    COPD Mother    Cancer Mother        Skin   Cancer - Other Mother         bone, liver, breast, lung   Emphysema Mother    Heart disease Father        Pacemaker   Parkinson's disease Father    Depression Father    Depression Daughter    OCD Daughter    Alcohol abuse Maternal Grandmother    Schizophrenia Maternal Grandmother    Diabetes Paternal Grandfather    Kidney disease Paternal Grandfather    Colon polyps Neg Hx    Colon cancer Neg Hx    Esophageal cancer Neg Hx    Stomach cancer Neg Hx    Rectal cancer Neg Hx  Social History   Socioeconomic History   Marital status: Married    Spouse name: Not on file   Number of children: 2   Years of education: Not on file   Highest education level: Not on file  Occupational History   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     Social Determinants of Health   Financial Resource Strain: Not on file  Food Insecurity: Not on file  Transportation Needs: Not on file  Physical Activity: Not on file  Stress: Not on file  Social Connections: Not on file  Intimate Partner Violence: Not on file    Past Medical History, Surgical history, Social history, and Family history were reviewed and updated as appropriate.   Please see review of systems for further details on the patient's review from today.   Objective:   Physical Exam:  There were no vitals taken for this visit.  Physical Exam Constitutional:      General: Ralph Mack is not in acute distress. Musculoskeletal:        General: No deformity.   Neurological:     Mental Status: Ralph Mack is alert and oriented to person, place, and time.     Coordination: Coordination normal.  Psychiatric:        Attention and Perception: Attention and perception normal. Ralph Mack does not perceive auditory or visual hallucinations.        Mood and Affect: Mood is anxious. Mood is not depressed. Affect is not labile, blunt, angry or inappropriate.        Speech: Speech normal.        Behavior: Behavior normal.        Thought Content: Thought content normal. Thought content is not paranoid or delusional. Thought content does not include homicidal or suicidal ideation. Thought content does not include homicidal or suicidal plan.        Cognition and Memory: Cognition and memory normal.        Judgment: Judgment normal.     Comments: Insight intact     Lab Review:     Component Value Date/Time   NA 140 08/07/2020 1100   K 4.8 08/07/2020 1100   CL 99 08/07/2020 1100   CO2 28 08/07/2020 1100   GLUCOSE 95 08/07/2020 1100   BUN 12 08/07/2020 1100   CREATININE 0.71 (L) 08/07/2020 1100   CALCIUM 9.0 08/07/2020 1100   PROT 6.4 08/07/2020 1100   ALBUMIN 4.4 08/07/2020 1100   AST 15 08/07/2020 1100   ALT 20 08/07/2020 1100   ALKPHOS 58 08/07/2020 1100   BILITOT <0.2 08/07/2020 1100   GFRNONAA 105 08/07/2020 1100   GFRAA 121 08/07/2020 1100       Component Value Date/Time   WBC 6.3 08/07/2020 1100   RBC 4.61 08/07/2020 1100   HGB 14.8 08/07/2020 1100   HCT 43.5 08/07/2020 1100   PLT 209 08/07/2020 1100   MCV  94 08/07/2020 1100   MCH 32.1 08/07/2020 1100   MCHC 34.0 08/07/2020 1100   RDW 11.9 08/07/2020 1100   LYMPHSABS 2.0 06/01/2019 1018   EOSABS 0.3 06/01/2019 1018   BASOSABS 0.1 06/01/2019 1018    No results found for: POCLITH, LITHIUM   No results found for: PHENYTOIN, PHENOBARB, VALPROATE, CBMZ   .res Assessment: Plan:   Recommend considering sleep study to rule out sleep apnea.  Discussed alternatives to Sertraline with less risk of  sexual side effects. Discussed potential benefits, risks, and side effects of Viibryd and pt agrees to trial. Discussed need for cross-titration to minimize risk for side effects and discontinuation s/s.  Pt to follow-up in 6 months or sooner if clinically indicated.  Patient advised to contact office with any questions, adverse effects, or acute worsening in signs and symptoms.  Ralph Mack was seen today for anxiety, insomnia and depression.  Diagnoses and all orders for this visit:  Panic disorder -     Vilazodone HCl (VIIBRYD) 40 MG TABS; Take 1 tablet (40 mg total) by mouth daily. -     sertraline (ZOLOFT) 100 MG tablet; Take 1.5 tablets daily for one week, then decrease to 1 tablet daily for one week, then decrease to 1/2 tablet daily for one week, then stop.  Generalized anxiety disorder -     Vilazodone HCl (VIIBRYD) 40 MG TABS; Take 1 tablet (40 mg total) by mouth daily. -     sertraline (ZOLOFT) 100 MG tablet; Take 1.5 tablets daily for one week, then decrease to 1 tablet daily for one week, then decrease to 1/2 tablet daily for one week, then stop.  Depression, unspecified depression type -     Vilazodone HCl (VIIBRYD) 40 MG TABS; Take 1 tablet (40 mg total) by mouth daily. -     sertraline (ZOLOFT) 100 MG tablet; Take 1.5 tablets daily for one week, then decrease to 1 tablet daily for one week, then decrease to 1/2 tablet daily for one week, then stop.     Please see After Visit Summary for patient specific instructions.  Future Appointments  Date Time Provider [place]  11/20/2020 10:00 AM Thayer Headings, PMHNP [facility] None  07/17/2021 11:15 AM Hollice Espy, MD [facility] None    No orders of the defined types were placed in this encounter.   -------------------------------

## 2020-10-03 NOTE — Patient Instructions (Signed)
Decrease Sertraline to 150 mg daily for one week, then 100 mg daily for one week, then 50 mg daily for one week, then stop.   Start Viibryd 10 mg daily in the morning for one week (while taking Sertraline 150 mg daily), then increase to 20 mg daily (with Sertraline 100 mg daily), and then increase to 40 mg daily (while taking Sertraline 50 mg daily).  Take Viibryd with food/protein to minimize Gi side effects.   Call office with any questions or side effects.

## 2020-10-06 ENCOUNTER — Telehealth: Payer: Self-pay | Admitting: Psychiatry

## 2020-10-06 NOTE — Telephone Encounter (Signed)
Received call from pt that out of pocket cost for Viibryd would be cost prohibitive. Nurse also confirmed that his cost would be $153 for a 30 day supply. Left message that there are other treatment options. Also discussed Viibryd will be going generic later this year and may be more affordable soon. Advised provider would try calling him again on Monday to discuss other treatment options.

## 2020-10-10 ENCOUNTER — Other Ambulatory Visit: Payer: Self-pay | Admitting: Urology

## 2020-10-13 ENCOUNTER — Telehealth: Payer: Self-pay | Admitting: Psychiatry

## 2020-10-13 DIAGNOSIS — F410 Panic disorder [episodic paroxysmal anxiety] without agoraphobia: Secondary | ICD-10-CM

## 2020-10-13 DIAGNOSIS — F411 Generalized anxiety disorder: Secondary | ICD-10-CM

## 2020-10-13 DIAGNOSIS — F32A Depression, unspecified: Secondary | ICD-10-CM

## 2020-10-13 MED ORDER — BUSPIRONE HCL 15 MG PO TABS: ORAL_TABLET | ORAL | 2 refills | Status: DC

## 2020-10-13 MED ORDER — SERTRALINE HCL 100 MG PO TABS: ORAL_TABLET | ORAL | 1 refills | Status: DC

## 2020-10-13 NOTE — Telephone Encounter (Signed)
Returned call to pt. Discussed cost of Viibryd. He reports that his cousin died on 20-Jan-2023. He reports that he and his wife have decided to not use alcohol.  Discussed potential benefits, risks, and side effects of BuSpar with patient.  He agrees to a trial of BuSpar.  Discussed that he may be able to decrease sertraline if BuSpar is effective for his anxiety.  Discussed that lower doses of sertraline would be associated with less risk of sexual side effects.  Start BuSpar 15 mg 1/3 tablet twice daily for 1 week, then increase to 2/3 tablet twice daily for 1 week, then increase to 1 tablet twice daily for anxiety.  Patient advised to contact office with any questions, adverse effects, or acute worsening in signs and symptoms.

## 2020-10-13 NOTE — Telephone Encounter (Signed)
Danthony called about Vybrid. Reminder to call him when you are able.

## 2020-11-20 ENCOUNTER — Ambulatory Visit: Payer: BC Managed Care – PPO | Admitting: Psychiatry

## 2020-11-21 ENCOUNTER — Encounter: Payer: Self-pay | Admitting: Physician Assistant

## 2020-12-04 ENCOUNTER — Telehealth: Payer: Self-pay | Admitting: Physician Assistant

## 2020-12-04 NOTE — Telephone Encounter (Signed)
Pt complaining of SOB and dizziness. Pt is on the road as a truck driver. Per Herb Grays I advised patient to go to nearest ED for evaluation and treatment. Pt verbalized understanding and was agreeable. AS< CMA

## 2020-12-04 NOTE — Telephone Encounter (Signed)
Patient called in stating he is having breathing issues, shortness of breath, and light headedness. Patient states when he is walking around he has to stop and catch his breath because he gets winded. Patient states it started around three weeks ago when he switched from Zoloft to Buspirone. Patient was advised by our [place] ([place]) to go to the emergency room or dial 911.

## 2020-12-06 ENCOUNTER — Telehealth: Payer: Self-pay | Admitting: Psychiatry

## 2020-12-06 ENCOUNTER — Telehealth: Payer: Self-pay | Admitting: Physician Assistant

## 2020-12-06 ENCOUNTER — Encounter: Payer: Self-pay | Admitting: Physician Assistant

## 2020-12-06 ENCOUNTER — Ambulatory Visit
Admission: RE | Admit: 2020-12-06 | Discharge: 2020-12-06 | Disposition: A | Discharge status: Home / Self Care | Payer: BC Managed Care – PPO | Source: Ambulatory Visit | Attending: Physician Assistant | Admitting: Physician Assistant

## 2020-12-06 ENCOUNTER — Ambulatory Visit (INDEPENDENT_AMBULATORY_CARE_PROVIDER_SITE_OTHER): Payer: BC Managed Care – PPO | Admitting: Physician Assistant

## 2020-12-06 ENCOUNTER — Other Ambulatory Visit: Payer: Self-pay

## 2020-12-06 VITALS — Ht 67.5 in | Wt 242.0 lb

## 2020-12-06 DIAGNOSIS — R062 Wheezing: Secondary | ICD-10-CM | POA: Diagnosis not present

## 2020-12-06 DIAGNOSIS — J019 Acute sinusitis, unspecified: Secondary | ICD-10-CM

## 2020-12-06 DIAGNOSIS — H9202 Otalgia, left ear: Secondary | ICD-10-CM | POA: Diagnosis not present

## 2020-12-06 DIAGNOSIS — Z1152 Encounter for screening for COVID-19: Secondary | ICD-10-CM

## 2020-12-06 DIAGNOSIS — R059 Cough, unspecified: Secondary | ICD-10-CM

## 2020-12-06 DIAGNOSIS — R0602 Shortness of breath: Secondary | ICD-10-CM | POA: Diagnosis not present

## 2020-12-06 MED ORDER — ALBUTEROL SULFATE HFA 108 (90 BASE) MCG/ACT IN AERS: 2.0000 | INHALATION_SPRAY | Freq: Four times a day (QID) | RESPIRATORY_TRACT | 0 refills | Status: DC | PRN

## 2020-12-06 NOTE — Telephone Encounter (Signed)
Patient called in stating his insurance does not cover albuterol. Please advise, thanks.

## 2020-12-06 NOTE — Progress Notes (Unsigned)
Telehealth office visit note for Ralph Reid, PA-C- at Primary Care at [facility]   I connected with current patient today by telephone and verified that I am speaking with the correct person   . Location of the patient: Home . Location of the provider: Office - This visit type was conducted due to national recommendations for restrictions regarding the COVID-19 Pandemic (e.g. social distancing) in an effort to limit this patient's exposure and mitigate transmission in our community.    - No physical exam could be performed with this format, beyond that communicated to [place] by the patient/ family members as noted.   - Additionally my office staff/ schedulers were to discuss with the patient that there may be a monetary charge related to this service, depending on their medical insurance.  My understanding is that patient understood and consented to proceed.     _________________________________________________________________________________   History of Present Illness: Patient calls in with upper respiratory symptoms.  Complains of fatigue, headache, sinus pressure, left ear pain with nonbloody drainage, sinus drainage, feeling lightheaded, wheezing and dry cough.  Reports does not feel short of breath or have trouble with breathing but gives an example of having to rest for a minute such as when bringing groceries in. Denies chest pain, palpitations or fever.  Reports antidepressant medication sertraline was changed to BuSpar due to side effects and feels like since starting medication his upper respiratory symptoms started which is about 1 month ago.  States feels like has a lingering cold. Has not had a Covid test within the past 2 weeks.  Has recently increased smoking due to stress and anxiety, 1 PPD.  States takes many medications and feels like there are some medication interactions and wants to be able to come off medications.     GAD 7 : Generalized Anxiety Score 03/22/2019  01/06/2019 10/05/2018 08/25/2018  Nervous, Anxious, on Edge 0 0 2 2  Control/stop worrying 0 0 2 2  Worry too much - different things 0 0 2 2  Trouble relaxing 0 0 2 2  Restless 0 0 2 2  Easily annoyed or irritable 0 1 3 2   Afraid - awful might happen 0 0 2 2  Total GAD 7 Score 0 1 15 14   Anxiety Difficulty Not difficult at all - Very difficult Somewhat difficult    Depression screen [facility] 2/9 09/19/2020 08/07/2020 10/25/2019 06/28/2019 05/25/2019  Decreased Interest 0 0 0 0 0  Down, Depressed, Hopeless 0 0 0 0 0  PHQ - 2 Score 0 0 0 0 0  Altered sleeping 0 0 0 0 0  Tired, decreased energy 0 0 0 0 1  Change in appetite 0 0 0 0 0  Feeling bad or failure about yourself  0 0 0 0 0  Trouble concentrating 0 0 0 0 0  Moving slowly or fidgety/restless 0 0 0 0 0  Suicidal thoughts 0 0 0 0 0  PHQ-9 Score 0 0 0 0 1  Difficult doing work/chores - - - Not difficult at all Not difficult at all  Some recent data might be hidden      Impression and Recommendations:     1. Encounter for screening for COVID-19   2. Cough   3. Wheezing   4. Left ear pain   5. Shortness of breath     Encounter for screening for COVID-19, cough, wheezing, shortness of breath, left ear pain: -Discussed with patient if concerned about  potential medication side effect recommend to contact psychiatrist. -Given complaint of multiple symptoms recommend COVID-19 testing and will place order for chest x-ray to evaluate for pulmonary etiology for lower respiratory symptoms. -Will send albuterol inhaler to use as needed for wheezing and/or shortness of breath. Pending imaging and COVID-19 results will consider starting antibiotic therapy given symptoms have been ongoing for >10 days and is complaining of left otalgia with otorrhea. -Recommend to monitor for worsening symptoms and seek immediate medical care especially if starts experiencing chest pain, fever, confusion or shortness of breath/wheezing worsening. -Recommend to  reduce tobacco use which is likely contributing to lower respiratory symptoms. -Advised to schedule chronic follow-up and will further discuss medication therapy.  - As part of my medical decision making, I reviewed the following data within the [place] History obtained from pt /family, CMA notes reviewed and incorporated if applicable, Labs reviewed, Radiograph/ tests reviewed if applicable and OV notes from prior OV's with me, as well as any other specialists she/he has seen since seeing me last, were all reviewed and used in my medical decision making process today.    - Additionally, when appropriate, discussion had with patient regarding our treatment plan, and their biases/concerns about that plan were used in my medical decision making today.    - The patient agreed with the plan and demonstrated an understanding of the instructions.   No barriers to understanding were identified.     - The patient was advised to call back or seek an in-person evaluation if the symptoms worsen or if the condition fails to improve as anticipated.   Return for HTN, HLD, tobacco use in 1-2 months .    Orders Placed This Encounter  Procedures  . Novel Coronavirus, NAA ([facility])  . DG Chest 2 View    Meds ordered this encounter  Medications  . DISCONTD: albuterol (VENTOLIN HFA) 108 (90 Base) MCG/ACT inhaler    Sig: Inhale 2 puffs into the lungs every 6 (six) hours as needed for wheezing or shortness of breath.    Dispense:  8 g    Refill:  0    There are no discontinued medications.     Time spent on telephone was 22 minutes.      The [place] was signed into law in 2016 which includes the topic of electronic health records.  This provides immediate access to information in MyChart.  This includes consultation notes, operative notes, office notes, lab results and pathology reports.  If you have any questions about what you read please let us know at your next  visit or call us at the office.  We are right here with you.  Note:  This note was prepared with assistance of Dragon voice recognition software. Occasional wrong-word or sound-a-like substitutions may have occurred due to the inherent limitations of voice recognition software.  __________________________________________________________________________________     Patient Care Team    Relationship Specialty Notifications Start End  Ralph Mack, [place] PCP - General   01/30/20   Minus Breeding, MD PCP - Cardiology Cardiology Admissions 02/08/20   Melida Quitter, MD Consulting Physician Otolaryngology  09/26/16   Kennith Gain, MD Consulting Physician Allergy  09/26/16    Comment: [address] [place].   Gatha Mayer, MD Consulting Physician Gastroenterology  11/21/16   Purnell Shoemaker., MD Attending Physician Psychiatry  05/25/19      -Vitals obtained; medications/ allergies reconciled;  personal medical, social, Sx etc.histories were  updated by CMA, reviewed by me and are reflected in chart   Patient Active Problem List   Diagnosis Date Noted  . CAD (coronary artery disease) 02/08/2020  . Rapid weight gain 10/25/2019  . Head injury 06/24/2019  . ?able Irritable bowel syndrome with primarily diarrhea 05/25/2019  . Orthostatic lightheadedness 05/25/2019  . Lymphocytic colitis 04/19/2019  . Hyperlipidemia 01/06/2019  . Mood disorder ([place]) 10/05/2018  . Dyslipidemia 09/11/2018  . Essential hypertension 09/11/2018  . Chest pain 09/11/2018  . HTN (hypertension), malignant 09/02/2018  . Stress-related physiological response affecting physical condition 2018-08-28  . Death of family member 08-28-18  . Sleeping difficulty 08/17/2018  . Acute reaction to situational stress 08/17/2018  . Irritability and anger 08/17/2018  . Low testosterone in male 08/17/2018  . White coat syndrome without diagnosis of hypertension 10/14/2017  . Hx of colonic polyp 11/13/2016  .  Hypothyroidism 10/24/2016  . Urinary frequency 10/24/2016  . Fatigue 10/24/2016  . OAB (overactive bladder) 10/24/2016  . Low libido 09/26/2016  . Environmental and seasonal allergies 09/26/2016  . Acute alcohol overingestion, uncomplicated ([place])- fell recently 09/26/2016  . History of substance abuse ([place]) 09/26/2016  . Hematochezia 09/26/2016  . Family hx of colon cancer requiring screening colonoscopy 09/26/2016  . Prediabetes 09/05/2015  . Vitamin D deficiency 09/05/2015  . Hypertriglyceridemia 09/05/2015  . Gout 07/26/2015  . Obesity, Class II, BMI 35-39.9, isolated 07/26/2015  . Smoker 07/26/2015  . Recurrent sinusitis 07/26/2015  . Cervical disc disease 07/26/2015     Current Meds  Medication Sig  . [DISCONTINUED] albuterol (VENTOLIN HFA) 108 (90 Base) MCG/ACT inhaler Inhale 2 puffs into the lungs every 6 (six) hours as needed for wheezing or shortness of breath.     Allergies:  No Known Allergies   ROS:  See above HPI for pertinent positives and negatives   Objective:   Height 5' 7.5" (1.715 m), weight 242 lb (109.8 kg).  (if some vitals are omitted, this means that patient was UNABLE to obtain them. ) General: A & O * 3; sounds in no acute distress, sounds congested  Respiratory: speaking in full sentences, no conversational dyspnea Psych: insight appears good, mood- appears full

## 2020-12-06 NOTE — Telephone Encounter (Signed)
Next visit 01/09/21. Ralph Mack has been on Buspirone for two full weeks now. Since on it it has caused nausea, coughing and he can't sleep well. He wants to know if he should take 1/2 pill instead of a whole? His phone number is [phone]

## 2020-12-06 NOTE — Patient Instructions (Signed)
Shortness of Breath, Adult Shortness of breath means you have trouble breathing. Shortness of breath could be a sign of a medical problem. Follow these instructions at home:  Watch for any changes in your symptoms.  Do not use any products that contain nicotine or tobacco, such as cigarettes, e-cigarettes, and chewing tobacco.  Do not smoke. Smoking can cause shortness of breath. If you need help to quit smoking, ask your doctor.  Avoid things that can make it harder to breathe, such as: ? Mold. ? Dust. ? Air pollution. ? Chemical smells. ? Things that can cause allergy symptoms (allergens), if you have allergies.  Keep your living space clean. Use products that help remove mold and dust.  Rest as needed. Slowly return to your normal activities.  Take over-the-counter and prescription medicines only as told by your doctor. This includes oxygen therapy and inhaled medicines.  Keep all follow-up visits as told by your doctor. This is important.   Contact a doctor if:  Your condition does not get better as soon as expected.  You have a hard time doing your normal activities, even after you rest.  You have new symptoms. Get help right away if:  Your shortness of breath gets worse.  You have trouble breathing when you are resting.  You feel light-headed or you pass out (faint).  You have a cough that is not helped by medicines.  You cough up blood.  You have pain with breathing.  You have pain in your chest, arms, shoulders, or belly (abdomen).  You have a fever.  You cannot walk up stairs.  You cannot exercise the way you normally do. These symptoms may represent a serious problem that is an emergency. Do not wait to see if the symptoms will go away. Get medical help right away. Call your local emergency services (911 in the [place] Do not drive yourself to the hospital. Summary  Shortness of breath is when you have trouble breathing enough air. It can be a sign of a  medical problem.  Avoid things that make it hard for you to breathe, such as smoking, pollution, mold, and dust.  Watch for any changes in your symptoms. Contact your doctor if you do not get better or you get worse. This information is not intended to replace advice given to you by your health care provider. Make sure you discuss any questions you have with your health care provider. Document Revised: 02/16/2018 Document Reviewed: 02/16/2018 Elsevier Patient Education  2021 Elsevier Inc.  

## 2020-12-06 NOTE — Telephone Encounter (Signed)
Please see alternate msg in chart regarding this medication. AS, CMA

## 2020-12-07 ENCOUNTER — Telehealth: Payer: Self-pay | Admitting: Physician Assistant

## 2020-12-07 ENCOUNTER — Other Ambulatory Visit: Payer: Self-pay | Admitting: Physician Assistant

## 2020-12-07 MED ORDER — ALBUTEROL SULFATE HFA 108 (90 BASE) MCG/ACT IN AERS: 2.0000 | INHALATION_SPRAY | Freq: Four times a day (QID) | RESPIRATORY_TRACT | 0 refills | Status: DC | PRN

## 2020-12-07 NOTE — Telephone Encounter (Signed)
Proair sent to pharmacy per pharmacy request due to insurance coverage.

## 2020-12-07 NOTE — Telephone Encounter (Signed)
Patient would like to make he gets a replacement for inhaler that is not covered by his insurance. He would also like to know if he is getting an antibiotic for his bronchitis/asthma. Please advise, thanks.

## 2020-12-07 NOTE — Telephone Encounter (Signed)
Patient aware that proair sent to pharmacy yesterday and again today. I also spoke with Gildardo Cranker from [facility] and gave verbal authorization.  Per [facility] plan of care we are not prescribing an antibiotic until the covid test is resulted. Patient made aware of this again today. Pt asking when covid results will be back and I advised him 48-72 hrs is the normal turn around. He asks if we will contact him over the wkend for results and I advised him no, we will be closed but he will be able to see the results via mychart. Patient verbalized understanding. Pt states he is feeling much better today. AS, CMA

## 2020-12-07 NOTE — Telephone Encounter (Signed)
Patient's pharmacy called stating they need a verbal authorization for proair which is covered by patient's insurance. Pharmacy states it is urgent. Please advise, thanks. [facility] on [address], thanks.

## 2020-12-08 ENCOUNTER — Other Ambulatory Visit: Payer: Self-pay | Admitting: Family Medicine

## 2020-12-08 DIAGNOSIS — E039 Hypothyroidism, unspecified: Secondary | ICD-10-CM

## 2020-12-08 NOTE — Telephone Encounter (Signed)
Left detailed message with information and to call back with further questions or concerns

## 2020-12-09 LAB — NOVEL CORONAVIRUS, NAA

## 2020-12-11 ENCOUNTER — Telehealth: Payer: Self-pay | Admitting: Physician Assistant

## 2020-12-11 MED ORDER — AZITHROMYCIN 250 MG PO TABS: ORAL_TABLET | ORAL | 0 refills | Status: DC

## 2020-12-11 NOTE — Telephone Encounter (Signed)
Discussed Covid-19 results with patient and sent in antibiotic- azithromycin x 5 days. Advised if symptoms fail to improve or worsen despite antibiotic recommend to discuss with Psychiatry if symptoms possibly medication side effect (Buspirone).   [name], PA-C

## 2020-12-11 NOTE — Telephone Encounter (Signed)
Maritza,  His COVID test was negative, so I'm not sure what he is stating in his message.

## 2020-12-11 NOTE — Telephone Encounter (Signed)
Patient would like to know the results of his chest scans and he would like to inquire about a COVID result that came back unable to determine. Please advise, thanks.

## 2020-12-12 ENCOUNTER — Other Ambulatory Visit: Payer: Self-pay | Admitting: Physician Assistant

## 2020-12-12 ENCOUNTER — Telehealth: Payer: Self-pay | Admitting: Physician Assistant

## 2020-12-12 DIAGNOSIS — E039 Hypothyroidism, unspecified: Secondary | ICD-10-CM

## 2020-12-12 MED ORDER — LEVOTHYROXINE SODIUM 50 MCG PO TABS
50.0000 ug | ORAL_TABLET | Freq: Every day | ORAL | 0 refills | Status: DC
Start: 2020-12-12 — End: 2021-02-19

## 2020-12-12 NOTE — Telephone Encounter (Signed)
Called patient yesterday and discussed test results. Has been addressed.   Thank you, Herb Grays

## 2020-12-12 NOTE — Telephone Encounter (Signed)
Patient would like his levothyroxine sent to [facility] on [address], thanks.

## 2020-12-12 NOTE — Telephone Encounter (Signed)
Refill sent.

## 2020-12-13 ENCOUNTER — Telehealth: Payer: Self-pay | Admitting: Psychiatry

## 2020-12-13 NOTE — Telephone Encounter (Signed)
Note completed and placed in folder for pt to pick up.

## 2020-12-13 NOTE — Telephone Encounter (Signed)
Called patient no answer LVM stating his refill was sent to the pharmacy. KM

## 2020-12-13 NOTE — Telephone Encounter (Signed)
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 Fax to [employer]

## 2020-12-13 NOTE — Telephone Encounter (Signed)
Letter has been faxed.

## 2020-12-16 ENCOUNTER — Telehealth: Payer: BC Managed Care – PPO | Admitting: Physician Assistant

## 2020-12-16 ENCOUNTER — Encounter: Payer: Self-pay | Admitting: Physician Assistant

## 2020-12-16 DIAGNOSIS — R059 Cough, unspecified: Secondary | ICD-10-CM

## 2020-12-16 DIAGNOSIS — R0602 Shortness of breath: Secondary | ICD-10-CM

## 2020-12-16 DIAGNOSIS — J45901 Unspecified asthma with (acute) exacerbation: Secondary | ICD-10-CM

## 2020-12-16 NOTE — Progress Notes (Signed)
Based on what you shared with me, I feel your condition warrants further evaluation and I recommend that you be seen for a face to face office visit.   Mr. Donoghue,  Due to your persistent shortness of breath in the setting of Asthma flare and cough, I recommend having a face to face visit for further evaluation of your symptoms. This would ensure that other infections such as pneumonia are considered and you would get the recommended evaluation of your shortness of breath.   NOTE: If you entered your credit card information for this eVisit, you will not be charged. You may see a "hold" on your card for the $35 but that hold will drop off and you will not have a charge processed.   If you are having a true medical emergency please call 911.      For an urgent face to face visit, [facility] has five urgent care centers for your convenience:     [facility] [place] Get Driving Directions [phone] [address] [place], [place] [address] . 10 am - 6pm Monday - Friday    [facility]) Get Driving Directions [phone] [address] [place], [place] [address] . 10 am to 8 pm Monday-Friday . 12 pm to 8 pm [facility] Get Driving Directions [phone] [place], [place], [place] [address] . 8 am to 8 pm Monday-Friday . 9 am to 6 pm Saturday . 11 am to 6 pm Sunday     [facility] Get Driving Directions  [phone] [address] [place], [place] [address] . 8 am to 8 pm Monday-Friday . 8 am to 4 pm [facility] [place] Get Driving Directions [phone] [address] [place],  [address] . 12 pm to 6 pm Monday-Friday      Your e-visit answers were reviewed by a board certified advanced clinical practitioner to complete your  personal care plan.  Thank you for using e-Visits.    I spent 5-10 minutes on review and completion of this note- Lacy Duverney [facility]

## 2020-12-17 ENCOUNTER — Encounter: Payer: Self-pay | Admitting: Physician Assistant

## 2020-12-18 ENCOUNTER — Other Ambulatory Visit: Payer: Self-pay

## 2020-12-18 ENCOUNTER — Encounter: Payer: Self-pay | Admitting: Nurse Practitioner

## 2020-12-18 ENCOUNTER — Ambulatory Visit (INDEPENDENT_AMBULATORY_CARE_PROVIDER_SITE_OTHER): Payer: BC Managed Care – PPO | Admitting: Nurse Practitioner

## 2020-12-18 VITALS — BP 129/82 | HR 64 | Temp 98.9°F | Ht 67.5 in | Wt 247.9 lb

## 2020-12-18 DIAGNOSIS — F39 Unspecified mood [affective] disorder: Secondary | ICD-10-CM

## 2020-12-18 DIAGNOSIS — J0140 Acute pansinusitis, unspecified: Secondary | ICD-10-CM

## 2020-12-18 DIAGNOSIS — R059 Cough, unspecified: Secondary | ICD-10-CM

## 2020-12-18 DIAGNOSIS — R062 Wheezing: Secondary | ICD-10-CM | POA: Diagnosis not present

## 2020-12-18 DIAGNOSIS — I10 Essential (primary) hypertension: Secondary | ICD-10-CM

## 2020-12-18 DIAGNOSIS — J410 Simple chronic bronchitis: Secondary | ICD-10-CM

## 2020-12-18 DIAGNOSIS — J3089 Other allergic rhinitis: Secondary | ICD-10-CM

## 2020-12-18 DIAGNOSIS — J449 Chronic obstructive pulmonary disease, unspecified: Secondary | ICD-10-CM | POA: Insufficient documentation

## 2020-12-18 DIAGNOSIS — F1020 Alcohol dependence, uncomplicated: Secondary | ICD-10-CM

## 2020-12-18 MED ORDER — PREDNISONE 10 MG (21) PO TBPK: ORAL_TABLET | ORAL | 0 refills | Status: DC

## 2020-12-18 MED ORDER — DOXYCYCLINE HYCLATE 100 MG PO TABS: 100.0000 mg | ORAL_TABLET | Freq: Two times a day (BID) | ORAL | 0 refills | Status: DC

## 2020-12-18 MED ORDER — BENZONATATE 200 MG PO CAPS: 200.0000 mg | ORAL_CAPSULE | Freq: Three times a day (TID) | ORAL | 0 refills | Status: DC | PRN

## 2020-12-18 NOTE — Progress Notes (Signed)
Acute Office Visit  Subjective:    Patient ID: Ralph Mack, male    DOB: 08-30-1966, 55 y.o.   MRN: [id number]  Chief Complaint  Patient presents with  . URI    HPI Patient is in today for evaluation of shortness of breath for the past several weeks. He states that talking and exertion increase his degree of shortness of breath. He states that he is a smoker. He smokes about 1/2 pack of cigarettes per day. He knows this is not helping with his level of shortness of breath. The patient states that he has a cough. The cough is "agonizing" and nonproductive. He feels like he has congestion in his mid chest that he is unable to clear with his cough. She states that shortness of breath and cough are both worse at night and when lying down. He states that he has sore throat. Hurts him to swallow. He state that this is new symptom which was present when he first work up. This has gradually improved as the day has gone on. He states that he does have a headache. He has left ear pain. The left ear feels as though it is congested and has constant drainage. He also states that he has been "sneezing like crazy."  He did have e-visit on 3/19. He took covid 19 test. First test he took was inconclusive. Took second test which was negative. He states that he also took home COVID 19 test this morning which was also negative. He did take a z-pack which was prescribed for him last week. He states that this did help for a few days. Once medication was out of his system, he started to feel poorly again.  The patient denies fever, chills, or body aches. He denies nausea, vomiting, or diarrhea. He does use a rescue inhaler which helps some when he is coughing or has worsening shortness of breath.   Past Medical History:  Diagnosis Date  . Allergy   . Arthritis   . Hx of colonic polyp 11/13/2016   10/2016 7 mm ssp/a - recall 2023  . Hypothyroidism   . Lymphocytic colitis 04/19/2019  . Prediabetes   . Recurrent  upper respiratory infection (URI)     Past Surgical History:  Procedure Laterality Date  . COLONOSCOPY  2018  . CORONARY ANGIOGRAM  2009    Family History  Problem Relation Age of Onset  . Asthma Mother   . COPD Mother   . Cancer Mother        Skin  . Cancer - Other Mother        bone, liver, breast, lung  . Emphysema Mother   . Heart disease Father        Pacemaker  . Parkinson's disease Father   . Depression Father   . Depression Daughter   . OCD Daughter   . Alcohol abuse Maternal Grandmother   . Schizophrenia Maternal Grandmother   . Diabetes Paternal Grandfather   . Kidney disease Paternal Grandfather   . Colon polyps Neg Hx   . Colon cancer Neg Hx   . Esophageal cancer Neg Hx   . Stomach cancer Neg Hx   . Rectal cancer Neg Hx     Social History   Socioeconomic History  . Marital status: Married    Spouse name: Not on file  . Number of children: 2  . Years of education: Not on file  . Highest education level: Not on file  Occupational History  .  Occupation: truck Publishing rights manager  Tobacco Use  . Smoking status: Current Every Day Smoker    Packs/day: 0.50    Years: 37.00    Pack years: 18.50    Types: Cigarettes  . Smokeless tobacco: Never Used  Vaping Use  . Vaping Use: Former  Substance and Sexual Activity  . Alcohol use: Yes    Alcohol/week: 12.0 standard drinks    Types: 12 Cans of beer per week  . Drug use: No  . Sexual activity: Not on file  Other Topics Concern  . 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     Social Determinants of Health   Financial Resource Strain: Not on file  Food Insecurity: Not on file  Transportation Needs: Not on file  Physical Activity: Not on file  Stress: Not on file  Social Connections: Not on file  Intimate Partner Violence: Not on file    Outpatient Medications Prior to Visit  Medication Sig Dispense Refill  . albuterol (VENTOLIN HFA) 108 (90 Base) MCG/ACT  inhaler Inhale 2 puffs into the lungs every 6 (six) hours as needed for wheezing or shortness of breath. 8 g 0  . allopurinol (ZYLOPRIM) 300 MG tablet Take 1 tablet (300 mg total) by mouth daily. 90 tablet 1  . amLODipine (NORVASC) 5 MG tablet Take 1 tablet by mouth once daily 90 tablet 3  . azithromycin (ZITHROMAX) 250 MG tablet Take 2 tablets by mouth on day 1. Then take 1 tablet by mouth daily x 4 days. 6 tablet 0  . busPIRone (BUSPAR) 15 MG tablet Take 1/3 tablet p.o. twice daily for 1 week, then take 2/3 tablet p.o. twice daily for 1 week, then take 1 tablet p.o. twice daily 60 tablet 2  . Cholecalciferol (VITAMIN D3) 5000 units TABS 5,000 IU OTC vitamin D3 daily. 90 tablet 3  . Coenzyme Q10 (CO Q 10) 100 MG CAPS Take 3 capsules by mouth daily.    Marland Kitchen dicyclomine (BENTYL) 20 MG tablet TAKE 1 TABLET BY MOUTH 4 TIMES DAILY BEFORE MEAL(S) AND AT BEDTIME AS NEEDED 360 tablet 0  . diphenhydrAMINE HCl, Sleep, 25 MG CAPS Take by mouth. Takes 1-2 tabs po QHS    . fexofenadine (ALLEGRA) 180 MG tablet Take 180 mg by mouth daily.    . fluticasone (FLONASE) 50 MCG/ACT nasal spray Place 2 sprays into the nose daily.    . hydrochlorothiazide (MICROZIDE) 12.5 MG capsule Take 1 capsule (12.5 mg total) by mouth daily. 90 capsule 1  . hydrocortisone (ANUSOL-HC) 2.5 % rectal cream Place 1 application rectally 2 (two) times daily. 30 g 0  . levothyroxine (SYNTHROID) 50 MCG tablet Take 1 tablet (50 mcg total) by mouth daily. 90 tablet 0  . losartan (COZAAR) 50 MG tablet Take 1 tablet by mouth once daily 90 tablet 0  . Multiple Vitamins-Minerals (SUPER MEGA VITE 75/BETA CARO PO) Take by mouth.    . omega-3 acid ethyl esters (LOVAZA) 1 g capsule Take 2 capsules (2 g total) by mouth 2 (two) times daily. 180 capsule 3  . Potassium 99 MG TABS Take 3-4 tablets by mouth daily.    . propranolol (INDERAL) 20 MG tablet Take 1 tablet by mouth 4 times daily (Patient taking differently: 20 mg. Take 1 tablet by mouth 4 times  daily prn) 360 tablet 1  . tamsulosin (FLOMAX) 0.4 MG CAPS capsule Take 1 capsule by mouth once daily 90 capsule 3  . Zinc 50 MG  CAPS Take 1 capsule by mouth daily.     No facility-administered medications prior to visit.    No Known Allergies  Review of Systems  Constitutional: Positive for fatigue. Negative for activity change, chills and fever.  HENT: Positive for congestion, postnasal drip, rhinorrhea, sneezing and sore throat. Negative for sinus pain.   Eyes: Negative.   Respiratory: Positive for cough, chest tightness, shortness of breath and wheezing.   Cardiovascular: Negative for chest pain and palpitations.  Gastrointestinal: Negative for constipation, nausea and vomiting.  Musculoskeletal: Negative.  Negative for back pain and myalgias.  Skin: Negative for rash.  Neurological: Positive for headaches. Negative for dizziness and weakness.  Hematological: Positive for adenopathy.  Psychiatric/Behavioral: The patient is not nervous/anxious.   All other systems reviewed and are negative.      Objective:    Physical Exam Vitals and nursing note reviewed.  Constitutional:      General: He is not in acute distress.    Appearance: Normal appearance. He is well-developed. He is ill-appearing. He is not diaphoretic.  HENT:     Head: Normocephalic and atraumatic.     Right Ear: Tympanic membrane is erythematous and bulging.     Left Ear: Tenderness present. Tympanic membrane is erythematous and bulging.     Nose: Congestion present.     Right Sinus: Frontal sinus tenderness present.     Left Sinus: Frontal sinus tenderness present.     Mouth/Throat:     Pharynx: Posterior oropharyngeal erythema and uvula swelling present. No oropharyngeal exudate.  Eyes:     Pupils: Pupils are equal, round, and reactive to light.  Neck:     Thyroid: No thyromegaly.     Vascular: No JVD.     Trachea: No tracheal deviation.  Cardiovascular:     Rate and Rhythm: Normal rate and regular  rhythm.     Heart sounds: Normal heart sounds. No murmur heard. No friction rub. No gallop.   Pulmonary:     Effort: Pulmonary effort is normal. No respiratory distress.     Breath sounds: Wheezing present. No rales.     Comments: The patient has dry, harsh, non-productive cough.  Chest:     Chest wall: No tenderness.  Abdominal:     Palpations: Abdomen is soft.  Musculoskeletal:        General: Normal range of motion.     Cervical back: Normal range of motion and neck supple.  Lymphadenopathy:     Cervical: Cervical adenopathy present.  Skin:    General: Skin is warm and dry.  Neurological:     General: No focal deficit present.     Mental Status: He is alert and oriented to person, place, and time.     Cranial Nerves: No cranial nerve deficit.  Psychiatric:        Mood and Affect: Mood normal.        Behavior: Behavior normal.        Thought Content: Thought content normal.        Judgment: Judgment normal.     Today's Vitals   12/18/20 1319 12/18/20 1325 12/18/20 1401  BP: 129/82    Pulse: 64    Temp: 98.9 F (37.2 C)    SpO2: (!) 86% (!) 84% 92%  Weight: 247 lb 14.4 oz (112.4 kg)     Body mass index is 38.25 kg/m.   Wt Readings from Last 3 Encounters:  12/18/20 247 lb 14.4 oz (112.4 kg)  12/06/20 242 lb (  109.8 kg)  09/19/20 242 lb 6.4 oz (110 kg)    Health Maintenance Due  Topic Date Due  . COVID-19 Vaccine (1) Never done  . TETANUS/TDAP  Never done  . INFLUENZA VACCINE  04/30/2020    There are no preventive care reminders to display for this patient.   Lab Results  Component Value Date   TSH 1.450 08/07/2020   Lab Results  Component Value Date   WBC 6.3 08/07/2020   HGB 14.8 08/07/2020   HCT 43.5 08/07/2020   MCV 94 08/07/2020   PLT 209 08/07/2020   Lab Results  Component Value Date   NA 140 08/07/2020   K 4.8 08/07/2020   CO2 28 08/07/2020   GLUCOSE 95 08/07/2020   BUN 12 08/07/2020   CREATININE 0.71 (L) 08/07/2020   BILITOT <0.2  08/07/2020   ALKPHOS 58 08/07/2020   AST 15 08/07/2020   ALT 20 08/07/2020   PROT 6.4 08/07/2020   ALBUMIN 4.4 08/07/2020   CALCIUM 9.0 08/07/2020   Lab Results  Component Value Date   CHOL 190 08/07/2020   Lab Results  Component Value Date   HDL 59 08/07/2020   Lab Results  Component Value Date   LDLCALC 94 08/07/2020   Lab Results  Component Value Date   TRIG 218 (H) 08/07/2020   Lab Results  Component Value Date   CHOLHDL 3.2 08/07/2020   Lab Results  Component Value Date   HGBA1C 5.6 08/07/2020       Assessment & Plan:  1. Acute non-recurrent pansinusitis Start doxycycline 100mg  twice daily for next 10 days. Rest and increase fluids. Take OTC medication as needed and as indicated.  - doxycycline (VIBRA-TABS) 100 MG tablet; Take 1 tablet (100 mg total) by mouth 2 (two) times daily.  Dispense: 28 tablet; Refill: 0  2. Cough Add tessalon perls which may be taken up to three times daily as needed for cough. Use rescue inhaler as needed and as prescribed  - benzonatate (TESSALON) 200 MG capsule; Take 1 capsule (200 mg total) by mouth 3 (three) times daily as needed for cough.  Dispense: 30 capsule; Refill: 0  3. Wheezing Add prednisone dose pack. Take as directed for 6 days. Continue to use rescue inhaler as needed and as prescribed.  - predniSONE (STERAPRED UNI-PAK 21 TAB) 10 MG (21) TBPK tablet; 6 day taper - take by mouth as directed for 6 days  Dispense: 21 tablet; Refill: 0  4. Essential hypertension Stable. Continue bo medication as prescribed   5. Environmental and seasonal allergies Take allergy medications as indicated. Use rescue inhaler as needed and as prescribed    Problem List Items Addressed This Visit      Cardiovascular and Mediastinum   Essential hypertension     Respiratory   Acute non-recurrent pansinusitis - Primary   Relevant Medications   doxycycline (VIBRA-TABS) 100 MG tablet   predniSONE (STERAPRED UNI-PAK 21 TAB) 10 MG (21) TBPK  tablet   benzonatate (TESSALON) 200 MG capsule     Other   Environmental and seasonal allergies (Chronic)   Cough   Relevant Medications   benzonatate (TESSALON) 200 MG capsule   Wheezing   Relevant Medications   predniSONE (STERAPRED UNI-PAK 21 TAB) 10 MG (21) TBPK tablet       Meds ordered this encounter  Medications  . doxycycline (VIBRA-TABS) 100 MG tablet    Sig: Take 1 tablet (100 mg total) by mouth 2 (two) times daily.    Dispense:  28 tablet    Refill:  0    Order Specific Question:   Supervising Provider    Answer:   Beatrice Lecher D [[id number]]  . predniSONE (STERAPRED UNI-PAK 21 TAB) 10 MG (21) TBPK tablet    Sig: 6 day taper - take by mouth as directed for 6 days    Dispense:  21 tablet    Refill:  0    Order Specific Question:   Supervising Provider    Answer:   Beatrice Lecher D [[id number]]  . benzonatate (TESSALON) 200 MG capsule    Sig: Take 1 capsule (200 mg total) by mouth 3 (three) times daily as needed for cough.    Dispense:  30 capsule    Refill:  0    Order Specific Question:   Supervising Provider    Answer:   Beatrice Lecher D [[id number]]   Time spent with the patient was approximately 30 minutes. This time included reviewing progress notes, labs, imaging studies, and discussing plan for follow up.   Ronnell Freshwater, NP

## 2020-12-18 NOTE — Patient Instructions (Signed)

## 2020-12-18 NOTE — Telephone Encounter (Signed)
Pt scheduled for today at 130pm for in office. AS, CMA

## 2020-12-19 DIAGNOSIS — R059 Cough, unspecified: Secondary | ICD-10-CM | POA: Insufficient documentation

## 2020-12-19 DIAGNOSIS — R062 Wheezing: Secondary | ICD-10-CM | POA: Insufficient documentation

## 2020-12-19 DIAGNOSIS — J0140 Acute pansinusitis, unspecified: Secondary | ICD-10-CM | POA: Insufficient documentation

## 2020-12-21 ENCOUNTER — Other Ambulatory Visit: Payer: Self-pay | Admitting: Physician Assistant

## 2020-12-21 DIAGNOSIS — I10 Essential (primary) hypertension: Secondary | ICD-10-CM

## 2020-12-25 ENCOUNTER — Encounter: Payer: Self-pay | Admitting: Nurse Practitioner

## 2020-12-26 ENCOUNTER — Telehealth: Payer: Self-pay | Admitting: Physician Assistant

## 2020-12-26 ENCOUNTER — Ambulatory Visit: Payer: BC Managed Care – PPO | Admitting: Nurse Practitioner

## 2020-12-26 NOTE — Telephone Encounter (Signed)
Patient is having problems with his medications going to the right pharmacy. [facility] on [address]. He mentioned he needs albuterol, hydrochlorothiazide, and losartan and any other that have not been sent to his new pharmacy ([facility]). He would also like to know does he still have bronchitis, an ear infection, or a sinus related issue. Patient also states he has headaches, light headed, and shortness of breath (at times), he said not all the time. He said he is tired and napping a lot as well. He said at the end of the day he is drained and has no energy. Patient states he is off on Monday and Tuesday should he need an appoitnment, etc He will be leaving town tomorrow at 8 AM Wednesday through Sunday (30th-3rd) Please advise, thanks.

## 2020-12-26 NOTE — Telephone Encounter (Signed)
Pt added to schedule today. AS, CMA

## 2020-12-26 NOTE — Telephone Encounter (Signed)
Opened in error

## 2021-01-05 ENCOUNTER — Encounter: Payer: Self-pay | Admitting: Nurse Practitioner

## 2021-01-05 ENCOUNTER — Encounter: Payer: Self-pay | Admitting: Urology

## 2021-01-05 ENCOUNTER — Other Ambulatory Visit: Payer: Self-pay | Admitting: Physician Assistant

## 2021-01-05 DIAGNOSIS — R0602 Shortness of breath: Secondary | ICD-10-CM

## 2021-01-05 DIAGNOSIS — R062 Wheezing: Secondary | ICD-10-CM

## 2021-01-05 MED ORDER — ALBUTEROL SULFATE HFA 108 (90 BASE) MCG/ACT IN AERS: 2.0000 | INHALATION_SPRAY | Freq: Four times a day (QID) | RESPIRATORY_TRACT | 0 refills | Status: DC | PRN

## 2021-01-06 ENCOUNTER — Emergency Department (HOSPITAL_COMMUNITY): Payer: BC Managed Care – PPO

## 2021-01-06 ENCOUNTER — Other Ambulatory Visit: Payer: Self-pay

## 2021-01-06 ENCOUNTER — Encounter (HOSPITAL_COMMUNITY): Payer: Self-pay

## 2021-01-06 ENCOUNTER — Inpatient Hospital Stay (HOSPITAL_COMMUNITY)
Admission: EM | Admit: 2021-01-06 | Discharge: 2021-01-10 | DRG: 190 | Disposition: A | Discharge status: Home / Self Care | Payer: BC Managed Care – PPO | Attending: Internal Medicine | Admitting: Internal Medicine

## 2021-01-06 DIAGNOSIS — F17210 Nicotine dependence, cigarettes, uncomplicated: Secondary | ICD-10-CM | POA: Diagnosis present

## 2021-01-06 DIAGNOSIS — F1010 Alcohol abuse, uncomplicated: Secondary | ICD-10-CM | POA: Diagnosis not present

## 2021-01-06 DIAGNOSIS — J9620 Acute and chronic respiratory failure, unspecified whether with hypoxia or hypercapnia: Secondary | ICD-10-CM

## 2021-01-06 DIAGNOSIS — Z6837 Body mass index (BMI) 37.0-37.9, adult: Secondary | ICD-10-CM

## 2021-01-06 DIAGNOSIS — J441 Chronic obstructive pulmonary disease with (acute) exacerbation: Secondary | ICD-10-CM | POA: Diagnosis present

## 2021-01-06 DIAGNOSIS — R0602 Shortness of breath: Secondary | ICD-10-CM | POA: Diagnosis not present

## 2021-01-06 DIAGNOSIS — R062 Wheezing: Secondary | ICD-10-CM

## 2021-01-06 DIAGNOSIS — E871 Hypo-osmolality and hyponatremia: Secondary | ICD-10-CM | POA: Diagnosis present

## 2021-01-06 DIAGNOSIS — F419 Anxiety disorder, unspecified: Secondary | ICD-10-CM | POA: Diagnosis present

## 2021-01-06 DIAGNOSIS — R918 Other nonspecific abnormal finding of lung field: Secondary | ICD-10-CM | POA: Diagnosis present

## 2021-01-06 DIAGNOSIS — R0902 Hypoxemia: Secondary | ICD-10-CM

## 2021-01-06 DIAGNOSIS — Z818 Family history of other mental and behavioral disorders: Secondary | ICD-10-CM

## 2021-01-06 DIAGNOSIS — Z79890 Hormone replacement therapy (postmenopausal): Secondary | ICD-10-CM | POA: Diagnosis not present

## 2021-01-06 DIAGNOSIS — I10 Essential (primary) hypertension: Secondary | ICD-10-CM | POA: Diagnosis present

## 2021-01-06 DIAGNOSIS — Z79899 Other long term (current) drug therapy: Secondary | ICD-10-CM

## 2021-01-06 DIAGNOSIS — J9601 Acute respiratory failure with hypoxia: Secondary | ICD-10-CM | POA: Diagnosis present

## 2021-01-06 DIAGNOSIS — J189 Pneumonia, unspecified organism: Secondary | ICD-10-CM

## 2021-01-06 DIAGNOSIS — E785 Hyperlipidemia, unspecified: Secondary | ICD-10-CM | POA: Diagnosis present

## 2021-01-06 DIAGNOSIS — J9811 Atelectasis: Secondary | ICD-10-CM | POA: Diagnosis present

## 2021-01-06 DIAGNOSIS — I2510 Atherosclerotic heart disease of native coronary artery without angina pectoris: Secondary | ICD-10-CM | POA: Diagnosis present

## 2021-01-06 DIAGNOSIS — F32A Depression, unspecified: Secondary | ICD-10-CM | POA: Diagnosis present

## 2021-01-06 DIAGNOSIS — E782 Mixed hyperlipidemia: Secondary | ICD-10-CM | POA: Diagnosis present

## 2021-01-06 DIAGNOSIS — Z20822 Contact with and (suspected) exposure to covid-19: Secondary | ICD-10-CM | POA: Diagnosis present

## 2021-01-06 DIAGNOSIS — I4892 Unspecified atrial flutter: Secondary | ICD-10-CM | POA: Diagnosis not present

## 2021-01-06 DIAGNOSIS — F17200 Nicotine dependence, unspecified, uncomplicated: Secondary | ICD-10-CM | POA: Diagnosis not present

## 2021-01-06 DIAGNOSIS — Z87891 Personal history of nicotine dependence: Secondary | ICD-10-CM | POA: Diagnosis present

## 2021-01-06 DIAGNOSIS — I444 Left anterior fascicular block: Secondary | ICD-10-CM | POA: Diagnosis present

## 2021-01-06 DIAGNOSIS — I77810 Thoracic aortic ectasia: Secondary | ICD-10-CM | POA: Diagnosis present

## 2021-01-06 DIAGNOSIS — Z8249 Family history of ischemic heart disease and other diseases of the circulatory system: Secondary | ICD-10-CM | POA: Diagnosis not present

## 2021-01-06 DIAGNOSIS — E039 Hypothyroidism, unspecified: Secondary | ICD-10-CM | POA: Diagnosis present

## 2021-01-06 DIAGNOSIS — Z825 Family history of asthma and other chronic lower respiratory diseases: Secondary | ICD-10-CM

## 2021-01-06 HISTORY — DX: Unspecified atrial flutter: I48.92

## 2021-01-06 HISTORY — DX: Acute and chronic respiratory failure, unspecified whether with hypoxia or hypercapnia: J96.20

## 2021-01-06 HISTORY — DX: Essential (primary) hypertension: I10

## 2021-01-06 HISTORY — DX: Obesity, unspecified: E66.9

## 2021-01-06 LAB — COMPREHENSIVE METABOLIC PANEL
ALT: 23 U/L (ref 0–44)
AST: 22 U/L (ref 15–41)
Albumin: 3.8 g/dL (ref 3.5–5.0)
Alkaline Phosphatase: 55 U/L (ref 38–126)
Anion gap: 8 (ref 5–15)
BUN: 14 mg/dL (ref 6–20)
CO2: 34 mmol/L — ABNORMAL HIGH (ref 22–32)
Calcium: 8.5 mg/dL — ABNORMAL LOW (ref 8.9–10.3)
Chloride: 91 mmol/L — ABNORMAL LOW (ref 98–111)
Creatinine, Ser: 0.58 mg/dL — ABNORMAL LOW (ref 0.61–1.24)
GFR, Estimated: >60 mL/min (ref >60)
Glucose, Bld: 106 mg/dL — ABNORMAL HIGH (ref 70–99)
Potassium: 4.5 mmol/L (ref 3.5–5.1)
Sodium: 133 mmol/L — ABNORMAL LOW (ref 135–145)
Total Bilirubin: 0.9 mg/dL (ref 0.3–1.2)
Total Protein: 6.7 g/dL (ref 6.5–8.1)

## 2021-01-06 LAB — CBC WITH DIFFERENTIAL/PLATELET
Abs Immature Granulocytes: 0.01 10*3/uL (ref 0.00–0.07)
Basophils Absolute: 0.1 10*3/uL (ref 0.0–0.1)
Basophils Relative: 1 %
Eosinophils Absolute: 0.4 10*3/uL (ref 0.0–0.5)
Eosinophils Relative: 7 %
HCT: 43 % (ref 39.0–52.0)
Hemoglobin: 14.2 g/dL (ref 13.0–17.0)
Immature Granulocytes: 0 %
Lymphocytes Relative: 16 %
Lymphs Abs: 0.9 10*3/uL (ref 0.7–4.0)
MCH: 32.6 pg (ref 26.0–34.0)
MCHC: 33 g/dL (ref 30.0–36.0)
MCV: 98.6 fL (ref 80.0–100.0)
Monocytes Absolute: 0.8 10*3/uL (ref 0.1–1.0)
Monocytes Relative: 14 %
Neutro Abs: 3.5 10*3/uL (ref 1.7–7.7)
Neutrophils Relative %: 62 %
Platelets: 143 10*3/uL — ABNORMAL LOW (ref 150–400)
RBC: 4.36 MIL/uL (ref 4.22–5.81)
RDW: 12.3 % (ref 11.5–15.5)
WBC: 5.7 10*3/uL (ref 4.0–10.5)
nRBC: 0 % (ref 0.0–0.2)

## 2021-01-06 LAB — BRAIN NATRIURETIC PEPTIDE: B Natriuretic Peptide: 72.3 pg/mL (ref 0.0–100.0)

## 2021-01-06 LAB — TROPONIN I (HIGH SENSITIVITY)
Troponin I (High Sensitivity): 6 ng/L (ref <18)
Troponin I (High Sensitivity): 6 ng/L (ref <18)

## 2021-01-06 LAB — HIV ANTIBODY (ROUTINE TESTING W REFLEX): HIV Screen 4th Generation wRfx: NONREACTIVE

## 2021-01-06 LAB — RESP PANEL BY RT-PCR (FLU A&B, COVID) ARPGX2
Influenza A by PCR: NEGATIVE
Influenza B by PCR: NEGATIVE
SARS Coronavirus 2 by RT PCR: NEGATIVE

## 2021-01-06 LAB — PROCALCITONIN: Procalcitonin: <0.1 ng/mL

## 2021-01-06 LAB — TSH: TSH: 2.972 u[IU]/mL (ref 0.350–4.500)

## 2021-01-06 LAB — MRSA PCR SCREENING: MRSA by PCR: NEGATIVE

## 2021-01-06 MED ORDER — TAMSULOSIN HCL 0.4 MG PO CAPS
0.4000 mg | ORAL_CAPSULE | Freq: Every day | ORAL | Status: DC
  Administered 2021-01-06 – 2021-01-10 (×5): 0.4 mg via ORAL
  Filled 2021-01-06 (×5): qty 1

## 2021-01-06 MED ORDER — METHYLPREDNISOLONE SODIUM SUCC 125 MG IJ SOLR
60.0000 mg | Freq: Four times a day (QID) | INTRAMUSCULAR | Status: AC
  Administered 2021-01-06 – 2021-01-07 (×4): 60 mg via INTRAVENOUS
  Filled 2021-01-06 (×4): qty 2

## 2021-01-06 MED ORDER — ALBUTEROL SULFATE (2.5 MG/3ML) 0.083% IN NEBU: 2.5000 mg | INHALATION_SOLUTION | RESPIRATORY_TRACT | Status: DC | PRN

## 2021-01-06 MED ORDER — ENOXAPARIN SODIUM 40 MG/0.4ML ~~LOC~~ SOLN: 40.0000 mg | SUBCUTANEOUS | Status: DC

## 2021-01-06 MED ORDER — ALLOPURINOL 300 MG PO TABS
300.0000 mg | ORAL_TABLET | Freq: Every day | ORAL | Status: DC
  Administered 2021-01-07 – 2021-01-10 (×4): 300 mg via ORAL
  Filled 2021-01-06 (×4): qty 1

## 2021-01-06 MED ORDER — DICYCLOMINE HCL 20 MG PO TABS
20.0000 mg | ORAL_TABLET | Freq: Three times a day (TID) | ORAL | Status: DC
  Administered 2021-01-06 – 2021-01-10 (×12): 20 mg via ORAL
  Filled 2021-01-06 (×13): qty 1

## 2021-01-06 MED ORDER — ALBUTEROL SULFATE HFA 108 (90 BASE) MCG/ACT IN AERS
4.0000 | INHALATION_SPRAY | RESPIRATORY_TRACT | Status: DC | PRN
  Administered 2021-01-06: 4 via RESPIRATORY_TRACT
  Filled 2021-01-06: qty 6.7

## 2021-01-06 MED ORDER — PREDNISONE 20 MG PO TABS
40.0000 mg | ORAL_TABLET | Freq: Every day | ORAL | Status: DC
  Filled 2021-01-06: qty 2

## 2021-01-06 MED ORDER — BUSPIRONE HCL 5 MG PO TABS
15.0000 mg | ORAL_TABLET | Freq: Two times a day (BID) | ORAL | Status: DC
  Administered 2021-01-06 – 2021-01-10 (×9): 15 mg via ORAL
  Filled 2021-01-06 (×7): qty 3
  Filled 2021-01-06: qty 2
  Filled 2021-01-06: qty 3

## 2021-01-06 MED ORDER — ENOXAPARIN SODIUM 60 MG/0.6ML ~~LOC~~ SOLN
55.0000 mg | SUBCUTANEOUS | Status: DC
  Administered 2021-01-06 – 2021-01-09 (×4): 55 mg via SUBCUTANEOUS
  Filled 2021-01-06 (×4): qty 0.6

## 2021-01-06 MED ORDER — ACETAMINOPHEN 650 MG RE SUPP: 650.0000 mg | Freq: Four times a day (QID) | RECTAL | Status: DC | PRN

## 2021-01-06 MED ORDER — AMLODIPINE BESYLATE 5 MG PO TABS
5.0000 mg | ORAL_TABLET | Freq: Every day | ORAL | Status: DC
  Administered 2021-01-06 – 2021-01-08 (×3): 5 mg via ORAL
  Filled 2021-01-06 (×3): qty 1

## 2021-01-06 MED ORDER — ACETAMINOPHEN 325 MG PO TABS
650.0000 mg | ORAL_TABLET | Freq: Four times a day (QID) | ORAL | Status: DC | PRN
  Administered 2021-01-06 – 2021-01-09 (×5): 650 mg via ORAL
  Filled 2021-01-06 (×5): qty 2

## 2021-01-06 MED ORDER — IOHEXOL 350 MG/ML SOLN
100.0000 mL | Freq: Once | INTRAVENOUS | Status: AC | PRN
  Administered 2021-01-06: 80 mL via INTRAVENOUS

## 2021-01-06 MED ORDER — LEVOTHYROXINE SODIUM 50 MCG PO TABS
50.0000 ug | ORAL_TABLET | Freq: Every day | ORAL | Status: DC
  Administered 2021-01-06 – 2021-01-10 (×5): 50 ug via ORAL
  Filled 2021-01-06 (×5): qty 1

## 2021-01-06 MED ORDER — HYDROCOD POLST-CPM POLST ER 10-8 MG/5ML PO SUER
5.0000 mL | Freq: Two times a day (BID) | ORAL | Status: DC | PRN
  Administered 2021-01-07 – 2021-01-09 (×4): 5 mL via ORAL
  Filled 2021-01-06 (×4): qty 5

## 2021-01-06 MED ORDER — METHYLPREDNISOLONE SODIUM SUCC 125 MG IJ SOLR
125.0000 mg | Freq: Once | INTRAMUSCULAR | Status: AC
  Administered 2021-01-06: 125 mg via INTRAVENOUS
  Filled 2021-01-06: qty 2

## 2021-01-06 MED ORDER — ORAL CARE MOUTH RINSE
15.0000 mL | Freq: Two times a day (BID) | OROMUCOSAL | Status: DC
  Administered 2021-01-06 – 2021-01-10 (×7): 15 mL via OROMUCOSAL

## 2021-01-06 MED ORDER — IPRATROPIUM-ALBUTEROL 0.5-2.5 (3) MG/3ML IN SOLN
3.0000 mL | Freq: Four times a day (QID) | RESPIRATORY_TRACT | Status: DC
  Administered 2021-01-06 (×2): 3 mL via RESPIRATORY_TRACT
  Filled 2021-01-06 (×2): qty 3

## 2021-01-06 MED ORDER — HYDRALAZINE HCL 20 MG/ML IJ SOLN: 10.0000 mg | Freq: Three times a day (TID) | INTRAMUSCULAR | Status: DC | PRN

## 2021-01-06 MED ORDER — POLYETHYLENE GLYCOL 3350 17 G PO PACK: 17.0000 g | PACK | Freq: Every day | ORAL | Status: DC | PRN

## 2021-01-06 NOTE — ED Provider Notes (Signed)
[place] DEPT Provider Note   CSN: [id number] Arrival date & time: 01/06/21  0846     History Chief Complaint  Patient presents with  . Shortness of Breath    Ralph Mack is a 55 y.o. male.  HPI Patient presents with shortness of breath.  Is reportedly had for last few weeks.  States more fatigue.  States she will also fall asleep at times.  Reportedly has been seen and diagnosed with URI and sinusitis.  Has been feeling little better but then get worse again.  Upon arrival found to have sats in the 70s.  Has had a cough with mild sputum production.  Quit smoking 72 hours ago.  History of chronic bronchitis.  Not on oxygen at baseline.  He is a Administrator however.  States he had had some swelling in his left leg and had an ultrasound done that showed a Baker's cyst.  States he has been tested for Covid several times but has not been vaccinated.  No chest pain.  History of hypothyroidism.  He has had a cough with some mild sputum production.    Past Medical History:  Diagnosis Date  . Allergy   . Arthritis   . Hx of colonic polyp 11/13/2016   10/2016 7 mm ssp/a - recall 2023  . Hypertension   . Hypothyroidism   . Lymphocytic colitis 04/19/2019  . Prediabetes   . Recurrent upper respiratory infection (URI)     Patient Active Problem List   Diagnosis Date Noted  . Acute non-recurrent pansinusitis 12/19/2020  . Cough 12/19/2020  . Wheezing 12/19/2020  . Simple chronic bronchitis ([place]) 12/18/2020  . Morbid (severe) obesity due to excess calories ([place]) 12/18/2020  . CAD (coronary artery disease) 02/08/2020  . Rapid weight gain 10/25/2019  . Head injury 06/24/2019  . ?able Irritable bowel syndrome with primarily diarrhea 05/25/2019  . Orthostatic lightheadedness 05/25/2019  . Lymphocytic colitis 04/19/2019  . Hyperlipidemia 01/06/2019  . Mood disorder ([place]) 10/05/2018  . Dyslipidemia 09/11/2018  . Essential hypertension 09/11/2018  . Chest  pain 09/11/2018  . HTN (hypertension), malignant 09/02/2018  . Stress-related physiological response affecting physical condition 09-15-2018  . Death of family member 2018-09-15  . Sleeping difficulty 08/17/2018  . Acute reaction to situational stress 08/17/2018  . Irritability and anger 08/17/2018  . Low testosterone in male 08/17/2018  . White coat syndrome without diagnosis of hypertension 10/14/2017  . Hx of colonic polyp 11/13/2016  . Hypothyroidism 10/24/2016  . Urinary frequency 10/24/2016  . Fatigue 10/24/2016  . OAB (overactive bladder) 10/24/2016  . Low libido 09/26/2016  . Environmental and seasonal allergies 09/26/2016  . Acute alcohol overingestion, uncomplicated ([place])- fell recently 09/26/2016  . History of substance abuse ([place]) 09/26/2016  . Hematochezia 09/26/2016  . Family hx of colon cancer requiring screening colonoscopy 09/26/2016  . Prediabetes 09/05/2015  . Vitamin D deficiency 09/05/2015  . Hypertriglyceridemia 09/05/2015  . Gout 07/26/2015  . Obesity, Class II, BMI 35-39.9, isolated 07/26/2015  . Smoker 07/26/2015  . Recurrent sinusitis 07/26/2015  . Cervical disc disease 07/26/2015    Past Surgical History:  Procedure Laterality Date  . COLONOSCOPY  2018  . CORONARY ANGIOGRAM  2009       Family History  Problem Relation Age of Onset  . Asthma Mother   . COPD Mother   . Cancer Mother        Skin  . Cancer - Other Mother        bone,  liver, breast, lung  . Emphysema Mother   . Heart disease Father        Pacemaker  . Parkinson's disease Father   . Depression Father   . Depression Daughter   . OCD Daughter   . Alcohol abuse Maternal Grandmother   . Schizophrenia Maternal Grandmother   . Diabetes Paternal Grandfather   . Kidney disease Paternal Grandfather   . Colon polyps Neg Hx   . Colon cancer Neg Hx   . Esophageal cancer Neg Hx   . Stomach cancer Neg Hx   . Rectal cancer Neg Hx     Social History   Tobacco Use  . Smoking  status: Current Every Day Smoker    Packs/day: 0.50    Years: 37.00    Pack years: 18.50    Types: Cigarettes  . Smokeless tobacco: Never Used  Vaping Use  . Vaping Use: Former  Substance Use Topics  . Alcohol use: Yes    Alcohol/week: 12.0 standard drinks    Types: 12 Cans of beer per week  . Drug use: No    Home Medications Prior to Admission medications   Medication Sig Start Date End Date Taking? Authorizing Provider  albuterol (VENTOLIN HFA) 108 (90 Base) MCG/ACT inhaler Inhale 2 puffs into the lungs every 6 (six) hours as needed for wheezing or shortness of breath. 01/05/21   [name], PA-C  allopurinol (ZYLOPRIM) 300 MG tablet Take 1 tablet (300 mg total) by mouth daily. 08/11/20   [name], PA-C  amLODipine (NORVASC) 5 MG tablet Take 1 tablet by mouth once daily 04/03/20   Minus Breeding, MD  azithromycin (ZITHROMAX) 250 MG tablet Take 2 tablets by mouth on day 1. Then take 1 tablet by mouth daily x 4 days. 12/11/20   [name], PA-C  benzonatate (TESSALON) 200 MG capsule Take 1 capsule (200 mg total) by mouth 3 (three) times daily as needed for cough. 12/18/20   Ronnell Freshwater, NP  busPIRone (BUSPAR) 15 MG tablet Take 1/3 tablet p.o. twice daily for 1 week, then take 2/3 tablet p.o. twice daily for 1 week, then take 1 tablet p.o. twice daily 10/13/20   Thayer Headings, PMHNP  Cholecalciferol (VITAMIN D3) 5000 units TABS 5,000 IU OTC vitamin D3 daily. 10/24/16   Opalski, Neoma Laming, DO  Coenzyme Q10 (CO Q 10) 100 MG CAPS Take 3 capsules by mouth daily.    [provider]  dicyclomine (BENTYL) 20 MG tablet TAKE 1 TABLET BY MOUTH 4 TIMES DAILY BEFORE MEAL(S) AND AT BEDTIME AS NEEDED 07/31/20   Gatha Mayer, MD  diphenhydrAMINE HCl, Sleep, 25 MG CAPS Take by mouth. Takes 1-2 tabs po QHS    [provider]  doxycycline (VIBRA-TABS) 100 MG tablet Take 1 tablet (100 mg total) by mouth 2 (two) times daily. 12/18/20   Ronnell Freshwater, NP  fexofenadine  (ALLEGRA) 180 MG tablet Take 180 mg by mouth daily.    [provider]  fluticasone (FLONASE) 50 MCG/ACT nasal spray Place 2 sprays into the nose daily.    [provider]  hydrochlorothiazide (MICROZIDE) 12.5 MG capsule Take 1 capsule (12.5 mg total) by mouth daily. 08/11/20   [name], PA-C  hydrocortisone (ANUSOL-HC) 2.5 % rectal cream Place 1 application rectally 2 (two) times daily. 04/09/19   Gatha Mayer, MD  levothyroxine (SYNTHROID) 50 MCG tablet Take 1 tablet (50 mcg total) by mouth daily. 12/12/20   [name], PA-C  losartan (COZAAR) 50 MG tablet  Take 1 tablet (50 mg total) by mouth daily. 12/21/20   [name], PA-C  Multiple Vitamins-Minerals (SUPER MEGA VITE 75/BETA CARO PO) Take by mouth.    [provider]  omega-3 acid ethyl esters (LOVAZA) 1 g capsule Take 2 capsules (2 g total) by mouth 2 (two) times daily. 06/28/19   [name], DO  Potassium 99 MG TABS Take 3-4 tablets by mouth daily.    [provider]  predniSONE (STERAPRED UNI-PAK 21 TAB) 10 MG (21) TBPK tablet 6 day taper - take by mouth as directed for 6 days 12/18/20   Ronnell Freshwater, NP  propranolol (INDERAL) 20 MG tablet Take 1 tablet by mouth 4 times daily Patient taking differently: 20 mg. Take 1 tablet by mouth 4 times daily prn 08/11/20   [name], PA-C  tamsulosin [facility]) 0.4 MG CAPS capsule Take 1 capsule by mouth once daily 10/10/20   Hollice Espy, MD  Zinc 50 MG CAPS Take 1 capsule by mouth daily.    [provider]    Allergies    Patient has no known allergies.  Review of Systems   Review of Systems  Constitutional: Positive for fatigue. Negative for fever.  HENT: Negative for congestion.   Respiratory: Positive for cough, shortness of breath and wheezing.   Cardiovascular: Negative for chest pain.  Gastrointestinal: Negative for abdominal pain.  Genitourinary: Negative for flank pain.  Musculoskeletal: Negative for back  pain.  Skin: Negative for rash.  Neurological: Negative for weakness.  Psychiatric/Behavioral: Negative for confusion.    Physical Exam Updated Vital Signs BP (!) 177/100   Pulse 72   Temp 98.1 F (36.7 C) (Oral)   Resp 18   Ht 5\' 7"  (1.702 m)   Wt 109.8 kg   SpO2 96%   BMI 37.90 kg/m   Physical Exam Vitals and nursing note reviewed.  HENT:     Head: Normocephalic.  Eyes:     Pupils: Pupils are equal, round, and reactive to light.  Cardiovascular:     Rate and Rhythm: Normal rate and regular rhythm.  Pulmonary:     Comments: Diffuse wheezes and some prolonged expirations. Chest:     Chest wall: No tenderness.  Musculoskeletal:     Right lower leg: No edema.     Left lower leg: No edema.  Skin:    General: Skin is warm.     Capillary Refill: Capillary refill takes less than 2 seconds.  Neurological:     Mental Status: He is alert and oriented to person, place, and time.     ED Results / Procedures / Treatments   Labs (all labs ordered are listed, but only abnormal results are displayed) Labs Reviewed  COMPREHENSIVE METABOLIC PANEL - Abnormal; Notable for the following components:      Result Value   Sodium 133 (*)    Chloride 91 (*)    CO2 34 (*)    Glucose, Bld 106 (*)    Creatinine, Ser 0.58 (*)    Calcium 8.5 (*)    All other components within normal limits  CBC WITH DIFFERENTIAL/PLATELET - Abnormal; Notable for the following components:   Platelets 143 (*)    All other components within normal limits  RESP PANEL BY RT-PCR (FLU A&B, COVID) ARPGX2  TSH  TROPONIN I (HIGH SENSITIVITY)  TROPONIN I (HIGH SENSITIVITY)    EKG EKG Interpretation  Date/Time:  Saturday January 06 2021 09:09:24 EDT Ventricular Rate:  65 PR Interval:  171 QRS  Duration: 121 QT Interval:  417 QTC Calculation: 434 R Axis:   -54 Text Interpretation: Sinus rhythm Probable left atrial enlargement Nonspecific IVCD with LAD Confirmed by Davonna Belling [phone]) on 01/06/2021  10:02:48 AM   Radiology CT Angio Chest PE W and/or Wo Contrast  Result Date: 01/06/2021 CLINICAL DATA:  Shortness of breath EXAM: CT ANGIOGRAPHY CHEST WITH CONTRAST TECHNIQUE: Multidetector CT imaging of the chest was performed using the standard protocol during bolus administration of intravenous contrast. Multiplanar CT image reconstructions and MIPs were obtained to evaluate the vascular anatomy. CONTRAST:  89mL OMNIPAQUE IOHEXOL 350 MG/ML SOLN COMPARISON:  January 06, 2021 FINDINGS: Cardiovascular: There is no demonstrable pulmonary embolus. There is no evident thoracic aortic aneurysm or dissection. Visualized great vessels appear unremarkable. There are foci coronary artery calcification. There is no pericardial effusion or pericardial thickening. Mediastinum/Nodes: Thyroid appears unremarkable. There is a lymph node in the aortopulmonary window region measuring 1.4 x 1.3 cm. There is a lymph node to the right of the distal trachea measuring 1.3 x 1.1 cm. There is a subcarinal lymph node measuring 1.5 x 1.4 cm. A lymph node in the right hilum measures 1.0 x 1.0 cm. Several subcentimeter lymph nodes also noted in the mediastinum. No esophageal lesions are appreciable. Lungs/Pleura: There are areas of patchy airspace opacity in the left lower lobe and inferior lingula. On axial slice 72 series 5, there is an 8 x 7 mm nodular opacity in the posterior segment of the left upper lobe near the major fissure. On axial slice 72 series 5, there is a 5 x 4 mm nodular opacity in the anterior segment of the right upper lobe. There is atelectatic change in the right lung base. Trachea and major bronchial structures appear normal. No pleural effusion. No pneumothorax. Upper Abdomen: Visualized upper abdominal structures appear unremarkable. Musculoskeletal: No blastic or lytic bone lesions evident. No chest wall lesions. Review of the MIP images confirms the above findings. IMPRESSION: 1. No demonstrable pulmonary embolus.  No thoracic aortic aneurysm or dissection. There are foci of coronary artery calcification. 2. Areas of airspace opacity in the left lower lobe and inferior lingula consistent with pneumonia. Right base atelectasis. 3. 8 x 7 mm nodular opacity in the posterior segment left upper lobe near the major fissure. There is a 5 x 4 mm nodular opacity in the anterior segment of the right upper lobe. Non-contrast chest CT at 6-12 months is recommended. If the nodule is stable at time of repeat CT, then future CT at 18-24 months (from today's scan) is considered optional for low-risk patients, but is recommended for high-risk patients. This recommendation follows the consensus statement: Guidelines for Management of Incidental Pulmonary Nodules Detected on CT Images: From the Fleischner Society 2017; Radiology 2017; 284:228-243. 4. Several mildly enlarged lymph nodes of uncertain etiology. Lymph node prominence of this nature potentially could have reactive etiology given the areas of pneumonia. Neoplastic etiology cannot be excluded in this circumstance, however. Electronically Signed   By: Lowella Grip III M.D.   On: 01/06/2021 11:59   DG Chest Portable 1 View  Result Date: 01/06/2021 CLINICAL DATA:  55 year old male with history of shortness of breath, cough and chest pain. EXAM: PORTABLE CHEST 1 VIEW COMPARISON:  Chest x-ray 12/06/2020. FINDINGS: Lung volumes are normal. Ill-defined opacities are noted throughout the left lung base. No pleural effusions. No pneumothorax. No pulmonary nodule or mass noted. Pulmonary vasculature and the cardiomediastinal silhouette are within normal limits. IMPRESSION: 1. Ill-defined opacities at the  left base concerning for potential bronchopneumonia. Followup PA and lateral chest X-ray is recommended in 3-4 weeks following trial of antibiotic therapy to ensure resolution and exclude underlying malignancy. Electronically Signed   By: Vinnie Langton M.D.   On: 01/06/2021 10:48     Procedures Procedures   Medications Ordered in ED Medications  albuterol (VENTOLIN HFA) 108 (90 Base) MCG/ACT inhaler 4 puff (4 puffs Inhalation Given 01/06/21 0927)  methylPREDNISolone sodium succinate (SOLU-MEDROL) 125 mg/2 mL injection 125 mg (125 mg Intravenous Given 01/06/21 0926)  iohexol (OMNIPAQUE) 350 MG/ML injection 100 mL (80 mLs Intravenous Contrast Given 01/06/21 1102)    ED Course  I have reviewed the triage vital signs and the nursing notes.  Pertinent labs & imaging results that were available during my care of the patient were reviewed by me and considered in my medical decision making (see chart for details).    MDM Rules/Calculators/A&P                          Patient presented hypoxic with sats in the 76s and 70s.  Now improved on oxygen.  Have decreased from nonrebreather down to 6 L.  Initially wheezing and tight.  Has had a cough also.  Has been on antibiotic for sinusitis.  Also been on steroids.  X-ray showed potential pneumonia.  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  Did not show pulmonary embolism but showed pneumonia.  Did have also a couple lung nodules will need to be followed.  Also some swollen nodes that easily could be reactive to the pneumonia.  Negative Covid test.  Will admit to hospitalist  CRITICAL CARE Performed by: Davonna Belling Total critical care time: 30 minutes Critical care time was exclusive of separately billable procedures and treating other patients. Critical care was necessary to treat or prevent imminent or life-threatening deterioration. Critical care was time spent personally by me on the following activities: development of treatment plan with patient and/or surrogate as well as nursing, discussions with consultants, evaluation of patient's response to treatment, examination of patient, obtaining history from patient or surrogate, ordering and performing treatments and  interventions, ordering and review of laboratory studies, ordering and review of radiographic studies, pulse oximetry and re-evaluation of patient's condition.  Final Clinical Impression(s) / ED Diagnoses Final diagnoses:  Community acquired pneumonia, unspecified laterality  Hypoxia    Rx / DC Orders ED Discharge Orders    None       Davonna Belling, MD 01/06/21 1237

## 2021-01-06 NOTE — ED Notes (Signed)
Pt departed the dept with ED tech transport via stretcher

## 2021-01-06 NOTE — ED Notes (Signed)
Lab notified and aware of procalcitonin add-on.

## 2021-01-06 NOTE — ED Notes (Signed)
ED provider at bedside to assess. 15L non-rebreather applied

## 2021-01-06 NOTE — ED Notes (Signed)
Dr. Alvino Chapel made aware that pt is 70% on RA upon arrival to triage.

## 2021-01-06 NOTE — ED Notes (Signed)
Pt back from CT

## 2021-01-06 NOTE — H&P (Signed)
History and Physical        Hospital Admission Note Date: 01/06/2021  Patient name: Ralph Mack Medical record number: [id number] Date of birth: Dec 06, 1965 Age: 55 y.o. Gender: male  PCP: [name], PA-C    Chief Complaint    Chief Complaint  Patient presents with  . 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  He was seen by his PCP on 12/18/2020 with similar complaints and noted to have nonproductive cough, chest congestion and shortness of breath while lying down and associated sore throat. At the time, he had already completed a Z pack for one week that was prescribed by an virtual visit the week prior. He was prescribed doxycycline 10 mg BID x 10 days and prednisone taper on 3/21.  States he had some improvement after the steroids but his symptoms never resolved and have since worsened prompting him to come to the ED. In the ED the patient was 70% on room air and placed on 15 L/min NRB.  He admits to orthopnea and intermittent leg swelling, productive cough without hemoptysis, dyspnea on exertion, easily fatigued (however this seems to have started after switching from Zoloft to BuSpar recently), and intermittent night sweats though this is chronic.  Denies fever or any other complaints.  No known history of COPD.  States he quit cigarettes 72 hours ago and plans not to use them again.  He is hoping to see a pulmonologist after discharge.   ED Course: Afebrile, tachypneic,  hypertensive, hypoxic (SpO2 67% on room air) placed on 15 L/min. Notable Labs: Sodium 133, K4.5, chloride 91, CO2 34, BUN 14, creatinine  0.58, WBC 5.7, Hb 14.2, platelets 143, TSH 2.9, COVID-19 and flu negative, troponin negative x2.. Notable Imaging: CTA chest: Negative for PE, exposed opacity in the left lower lobe and inferior lingula consistent with pneumonia and right basilar atelectasis, 8 x 7 mm nodular opacity in posterior segment of LUL near the major fissure and 5 x 4 mm nodular opacity in the anterior segment of the RUL, several mildly enlarged lymph nodes reactive vs neoplastic. Patient received Solu-Medrol, albuterol.    Vitals:   01/06/21 1233 01/06/21 1258  BP:    Pulse:    Resp:    Temp:    SpO2: 96% 93%     Review of Systems:  Review of Systems  All other systems reviewed and are negative.   Medical/Social/Family History   Past Medical History: Past Medical History:  Diagnosis Date  . Allergy   . Arthritis   . Hx of colonic polyp 11/13/2016   10/2016 7 mm ssp/a - recall 2023  . Hypertension   . Hypothyroidism   . Lymphocytic colitis 04/19/2019  . Prediabetes   . Recurrent upper respiratory infection (URI)     Past Surgical History:  Procedure Laterality Date  . COLONOSCOPY  2018  . CORONARY ANGIOGRAM  2009    Medications: Prior to Admission medications   Medication Sig Start Date  End Date Taking? Authorizing Provider  albuterol (VENTOLIN HFA) 108 (90 Base) MCG/ACT inhaler Inhale 2 puffs into the lungs every 6 (six) hours as needed for wheezing or shortness of breath. 01/05/21  Yes Abonza, Maritza, PA-C  allopurinol (ZYLOPRIM) 300 MG tablet Take 1 tablet (300 mg total) by mouth daily. 08/11/20  Yes [name], PA-C  amLODipine (NORVASC) 5 MG tablet Take 1 tablet by mouth once daily 04/03/20  Yes Hochrein, Jeneen Rinks, MD  busPIRone (BUSPAR) 15 MG tablet Take 1/3 tablet p.o. twice daily for 1 week, then take 2/3 tablet p.o. twice daily for 1 week, then take 1 tablet p.o. twice daily 10/13/20  Yes Thayer Headings, PMHNP  Cholecalciferol (VITAMIN D3) 5000 units TABS 5,000 IU OTC vitamin D3 daily.  10/24/16  Yes Opalski, Neoma Laming, DO  dicyclomine (BENTYL) 20 MG tablet TAKE 1 TABLET BY MOUTH 4 TIMES DAILY BEFORE MEAL(S) AND AT BEDTIME AS NEEDED Patient taking differently: Take 20 mg by mouth 3 (three) times daily before meals. 07/31/20  Yes Gatha Mayer, MD  hydrochlorothiazide (MICROZIDE) 12.5 MG capsule Take 1 capsule (12.5 mg total) by mouth daily. 08/11/20  Yes [name], PA-C  levothyroxine (SYNTHROID) 50 MCG tablet Take 1 tablet (50 mcg total) by mouth daily. 12/12/20  Yes Abonza, Maritza, PA-C  losartan (COZAAR) 50 MG tablet Take 1 tablet (50 mg total) by mouth daily. 12/21/20  Yes Abonza, Maritza, PA-C  propranolol (INDERAL) 20 MG tablet Take 1 tablet by mouth 4 times daily Patient taking differently: Take 20 mg by mouth 4 (four) times daily as needed. Take 1 tablet by mouth 4 times daily prn 08/11/20  Yes Abonza, Maritza, PA-C  azithromycin (ZITHROMAX) 250 MG tablet Take 2 tablets by mouth on day 1. Then take 1 tablet by mouth daily x 4 days. Patient not taking: Reported on 01/06/2021 12/11/20   [name], PA-C  benzonatate (TESSALON) 200 MG capsule Take 1 capsule (200 mg total) by mouth 3 (three) times daily as needed for cough. Patient not taking: Reported on 01/06/2021 12/18/20   Ronnell Freshwater, NP  diphenhydrAMINE HCl, Sleep, 25 MG CAPS Take by mouth. Takes 1-2 tabs po QHS    [provider]  doxycycline (VIBRA-TABS) 100 MG tablet Take 1 tablet (100 mg total) by mouth 2 (two) times daily. 12/18/20   Ronnell Freshwater, NP  fexofenadine (ALLEGRA) 180 MG tablet Take 180 mg by mouth daily.    [provider]  fluticasone (FLONASE) 50 MCG/ACT nasal spray Place 2 sprays into the nose daily.    [provider]  hydrocortisone (ANUSOL-HC) 2.5 % rectal cream Place 1 application rectally 2 (two) times daily. 04/09/19   Gatha Mayer, MD  Multiple Vitamins-Minerals (SUPER MEGA VITE 75/BETA CARO PO) Take by mouth.    [provider]  omega-3 acid  ethyl esters (LOVAZA) 1 g capsule Take 2 capsules (2 g total) by mouth 2 (two) times daily. 06/28/19   [name], DO  Potassium 99 MG TABS Take 3-4 tablets by mouth daily.    [provider]  predniSONE (STERAPRED UNI-PAK 21 TAB) 10 MG (21) TBPK tablet 6 day taper - take by mouth as directed for 6 days Patient not taking: Reported on 01/06/2021 12/18/20   Ronnell Freshwater, NP  tamsulosin [facility]) 0.4 MG CAPS capsule Take 1 capsule by mouth once daily 10/10/20   Hollice Espy, MD  Zinc 50 MG CAPS Take 1 capsule by mouth daily.    [provider]  Allergies:  No Known Allergies  Social History:  reports that he has been smoking cigarettes. He has a 18.50 pack-year smoking history. He has never used smokeless tobacco. He reports current alcohol use of about 12.0 standard drinks of alcohol per week. He reports that he does not use drugs.  Family History: Family History  Problem Relation Age of Onset  . Asthma Mother   . COPD Mother   . Cancer Mother        Skin  . Cancer - Other Mother        bone, liver, breast, lung  . Emphysema Mother   . Heart disease Father        Pacemaker  . Parkinson's disease Father   . Depression Father   . Depression Daughter   . OCD Daughter   . Alcohol abuse Maternal Grandmother   . Schizophrenia Maternal Grandmother   . Diabetes Paternal Grandfather   . Kidney disease Paternal Grandfather   . Colon polyps Neg Hx   . Colon cancer Neg Hx   . Esophageal cancer Neg Hx   . Stomach cancer Neg Hx   . Rectal cancer Neg Hx      Objective   Physical Exam: Blood pressure (!) 177/100, pulse 72, temperature 98.1 F (36.7 C), temperature source Oral, resp. rate 18, height 5\' 7"  (1.702 m), weight 109.8 kg, SpO2 93 %.  Physical Exam Vitals and nursing note reviewed.  Constitutional:      Appearance: Normal appearance.  HENT:     Head: Normocephalic and atraumatic.  Eyes:     Conjunctiva/sclera: Conjunctivae normal.   Cardiovascular:     Rate and Rhythm: Normal rate and regular rhythm.  Pulmonary:     Effort: Pulmonary effort is normal.     Breath sounds: Wheezing present.     Comments: Diffuse wheezes O2 sat dropped to 88% while on 6 L/min with some conversation and resolved to mid 90s without changing flow rate Abdominal:     General: Abdomen is flat.     Palpations: Abdomen is soft.  Musculoskeletal:        General: No swelling or tenderness.     Right lower leg: No tenderness. No edema.     Left lower leg: No tenderness. No edema.  Skin:    Coloration: Skin is not jaundiced or pale.  Neurological:     Mental Status: He is alert. Mental status is at baseline.  Psychiatric:        Mood and Affect: Mood normal.        Behavior: Behavior normal.     LABS on Admission: I have personally reviewed all the labs and imaging below    Basic Metabolic Panel: Recent Labs  Lab 01/06/21 0932  NA 133*  K 4.5  CL 91*  CO2 34*  GLUCOSE 106*  BUN 14  CREATININE 0.58*  CALCIUM 8.5*   Liver Function Tests: Recent Labs  Lab 01/06/21 0932  AST 22  ALT 23  ALKPHOS 55  BILITOT 0.9  PROT 6.7  ALBUMIN 3.8   No results for input(s): LIPASE, AMYLASE in the last 168 hours. No results for input(s): AMMONIA in the last 168 hours. CBC: Recent Labs  Lab 01/06/21 0932  WBC 5.7  NEUTROABS 3.5  HGB 14.2  HCT 43.0  MCV 98.6  PLT 143*   Cardiac Enzymes: No results for input(s): CKTOTAL, CKMB, CKMBINDEX, TROPONINI in the last 168 hours. BNP: Invalid input(s): POCBNP CBG: No results for input(s): GLUCAP in  the last 168 hours.  Radiological Exams on Admission:  CT Angio Chest PE W and/or Wo Contrast  Result Date: 01/06/2021 CLINICAL DATA:  Shortness of breath EXAM: CT ANGIOGRAPHY CHEST WITH CONTRAST TECHNIQUE: Multidetector CT imaging of the chest was performed using the standard protocol during bolus administration of intravenous contrast. Multiplanar CT image reconstructions and MIPs were  obtained to evaluate the vascular anatomy. CONTRAST:  12mL OMNIPAQUE IOHEXOL 350 MG/ML SOLN COMPARISON:  January 06, 2021 FINDINGS: Cardiovascular: There is no demonstrable pulmonary embolus. There is no evident thoracic aortic aneurysm or dissection. Visualized great vessels appear unremarkable. There are foci coronary artery calcification. There is no pericardial effusion or pericardial thickening. Mediastinum/Nodes: Thyroid appears unremarkable. There is a lymph node in the aortopulmonary window region measuring 1.4 x 1.3 cm. There is a lymph node to the right of the distal trachea measuring 1.3 x 1.1 cm. There is a subcarinal lymph node measuring 1.5 x 1.4 cm. A lymph node in the right hilum measures 1.0 x 1.0 cm. Several subcentimeter lymph nodes also noted in the mediastinum. No esophageal lesions are appreciable. Lungs/Pleura: There are areas of patchy airspace opacity in the left lower lobe and inferior lingula. On axial slice 72 series 5, there is an 8 x 7 mm nodular opacity in the posterior segment of the left upper lobe near the major fissure. On axial slice 72 series 5, there is a 5 x 4 mm nodular opacity in the anterior segment of the right upper lobe. There is atelectatic change in the right lung base. Trachea and major bronchial structures appear normal. No pleural effusion. No pneumothorax. Upper Abdomen: Visualized upper abdominal structures appear unremarkable. Musculoskeletal: No blastic or lytic bone lesions evident. No chest wall lesions. Review of the MIP images confirms the above findings. IMPRESSION: 1. No demonstrable pulmonary embolus. No thoracic aortic aneurysm or dissection. There are foci of coronary artery calcification. 2. Areas of airspace opacity in the left lower lobe and inferior lingula consistent with pneumonia. Right base atelectasis. 3. 8 x 7 mm nodular opacity in the posterior segment left upper lobe near the major fissure. There is a 5 x 4 mm nodular opacity in the anterior  segment of the right upper lobe. Non-contrast chest CT at 6-12 months is recommended. If the nodule is stable at time of repeat CT, then future CT at 18-24 months (from today's scan) is considered optional for low-risk patients, but is recommended for high-risk patients. This recommendation follows the consensus statement: Guidelines for Management of Incidental Pulmonary Nodules Detected on CT Images: From the Fleischner Society 2017; Radiology 2017; 284:228-243. 4. Several mildly enlarged lymph nodes of uncertain etiology. Lymph node prominence of this nature potentially could have reactive etiology given the areas of pneumonia. Neoplastic etiology cannot be excluded in this circumstance, however. Electronically Signed   By: Lowella Grip III M.D.   On: 01/06/2021 11:59   DG Chest Portable 1 View  Result Date: 01/06/2021 CLINICAL DATA:  55 year old male with history of shortness of breath, cough and chest pain. EXAM: PORTABLE CHEST 1 VIEW COMPARISON:  Chest x-ray 12/06/2020. FINDINGS: Lung volumes are normal. Ill-defined opacities are noted throughout the left lung base. No pleural effusions. No pneumothorax. No pulmonary nodule or mass noted. Pulmonary vasculature and the cardiomediastinal silhouette are within normal limits. IMPRESSION: 1. Ill-defined opacities at the left base concerning for potential bronchopneumonia. Followup PA and lateral chest X-ray is recommended in 3-4 weeks following trial of antibiotic therapy to ensure resolution and exclude underlying  malignancy. Electronically Signed   By: Vinnie Langton M.D.   On: 01/06/2021 10:48      EKG: Normal axis, sinus rhythm with wide QRS (121 ms)   A & P   Principal Problem:   Acute respiratory failure with hypoxia (HCC) Active Problems:   Smoker   Hypothyroidism   Dyslipidemia   Essential hypertension   Morbid (severe) obesity due to excess calories ([place])   1. Acute hypoxic respiratory failure suspect secondary to undiagnosed  COPD exacerbation a. Hypoxic to 67% on room air at presentation currently tolerating 6 L/min b. CT chest without PE but showing areas of opacity in left lower lobe and inferior lingula consistent with pneumonia as well as right basilar atelectasis however the patient is afebrile without leukocytosis and recently completed both azithromycin and doxycycline.   c. Will check procalcitonin and if negative will hold off on antibiotics d. Will treat as a COPD exacerbation: Standing duo nebs, as needed albuterol inhaler, Solu-Medrol e. Incentive spirometry and flutter valve f. check a BNP g. Recommend outpatient pulmonology referral at discharge h. Continue tobacco cessation  2. Hypertension a. Hold home lisinopril-HCTZ b. As needed hydralazine for now  3. Hyperlipidemia a. Not on meds b. Outpatient follow-up  4. Morbid obesity Body mass index is 37.9 kg/m. a. Recommend lifestyle modification b. Consider outpatient OSA eval due to daytime fatigue  5. Hypothyroidism a. Continue home levothyroxine  6. LUL and RUL pulmonary nodules a. Repeat CT at 6 to 12 months b. Pulmonology referral as above  7. Tobacco use a. Continue cessation    DVT prophylaxis: Lovenox   Code Status: Full Code  Diet: Heart healthy Family Communication: Admission, patients condition and plan of care including tests being ordered have been discussed with the patient who indicates understanding and agrees with the plan and Code Status Disposition Plan: The appropriate patient status for this patient is INPATIENT. Inpatient status is judged to be reasonable and necessary in order to provide the required intensity of service to ensure the patient's safety. The patient's presenting symptoms, physical exam findings, and initial radiographic and laboratory data in the context of their chronic comorbidities is felt to place them at high risk for further clinical deterioration. Furthermore, it is not anticipated that the  patient will be medically stable for discharge from the hospital within 2 midnights of admission. The following factors support the patient status of inpatient.   " The patient's presenting symptoms include shortness of breath. " The worrisome physical exam findings include wheezing and hypoxia. " The initial radiographic and laboratory data are worrisome because of pulmonary nodules and possible pneumonia. " The chronic co-morbidities include tobacco use.   * I certify that at the point of admission it is my clinical judgment that the patient will require inpatient hospital care spanning beyond 2 midnights from the point of admission due to high intensity of service, high risk for further deterioration and high frequency of surveillance required.*   Status is: Inpatient  Remains inpatient appropriate because:IV treatments appropriate due to intensity of illness or inability to take PO and Inpatient level of care appropriate due to severity of illness   Dispo: The patient is from: Home              Anticipated d/c is to: Home              Patient currently is not medically stable to d/c.   Difficult to place patient No   Consultants  . None  Procedures  . None  Time Spent on Admission: 63 minutes    Harold Hedge, DO [facility]  01/06/2021, 1:45 PM

## 2021-01-06 NOTE — ED Notes (Signed)
EKG obtained and verified with provider.

## 2021-01-06 NOTE — ED Notes (Signed)
Hospitalist at the bedside 

## 2021-01-06 NOTE — ED Notes (Signed)
Pt initially 60% on room air to arrival in ED exam room. 15L non-rebreather applied and pt's o2 sats 100%

## 2021-01-06 NOTE — ED Triage Notes (Signed)
Pt presents with c/o shortness of breath for several weeks. Pt is a smoker but has not smoked in 3 days. Pt is visibly short of breath, O2 sats 70% on RA. Pt believes this to be COPD, has had pneumonia in the past.

## 2021-01-06 NOTE — ED Notes (Signed)
Per ED provider, pt supplementaol O2 to be decreased to 6L nasal cannula. Pt 95% on 6L nasal cannula at this time. ED provider aware.

## 2021-01-07 ENCOUNTER — Inpatient Hospital Stay (HOSPITAL_COMMUNITY): Payer: BC Managed Care – PPO

## 2021-01-07 DIAGNOSIS — R0602 Shortness of breath: Secondary | ICD-10-CM | POA: Diagnosis not present

## 2021-01-07 DIAGNOSIS — F17200 Nicotine dependence, unspecified, uncomplicated: Secondary | ICD-10-CM

## 2021-01-07 DIAGNOSIS — E785 Hyperlipidemia, unspecified: Secondary | ICD-10-CM

## 2021-01-07 DIAGNOSIS — E039 Hypothyroidism, unspecified: Secondary | ICD-10-CM

## 2021-01-07 DIAGNOSIS — J9601 Acute respiratory failure with hypoxia: Secondary | ICD-10-CM

## 2021-01-07 DIAGNOSIS — I10 Essential (primary) hypertension: Secondary | ICD-10-CM

## 2021-01-07 LAB — BASIC METABOLIC PANEL
Anion gap: 8 (ref 5–15)
BUN: 15 mg/dL (ref 6–20)
CO2: 36 mmol/L — ABNORMAL HIGH (ref 22–32)
Calcium: 8.9 mg/dL (ref 8.9–10.3)
Chloride: 93 mmol/L — ABNORMAL LOW (ref 98–111)
Creatinine, Ser: 0.69 mg/dL (ref 0.61–1.24)
GFR, Estimated: >60 mL/min (ref >60)
Glucose, Bld: 174 mg/dL — ABNORMAL HIGH (ref 70–99)
Potassium: 4.7 mmol/L (ref 3.5–5.1)
Sodium: 137 mmol/L (ref 135–145)

## 2021-01-07 LAB — CBC
HCT: 44.4 % (ref 39.0–52.0)
Hemoglobin: 14 g/dL (ref 13.0–17.0)
MCH: 32.4 pg (ref 26.0–34.0)
MCHC: 31.5 g/dL (ref 30.0–36.0)
MCV: 102.8 fL — ABNORMAL HIGH (ref 80.0–100.0)
Platelets: 147 10*3/uL — ABNORMAL LOW (ref 150–400)
RBC: 4.32 MIL/uL (ref 4.22–5.81)
RDW: 12.1 % (ref 11.5–15.5)
WBC: 6.2 10*3/uL (ref 4.0–10.5)
nRBC: 0 % (ref 0.0–0.2)

## 2021-01-07 LAB — ECHOCARDIOGRAM COMPLETE
Area-P 1/2: 3.99 cm2
Calc EF: 65.9 %
Height: 67 in
S' Lateral: 3.6 cm
Single Plane A2C EF: 69.6 %
Single Plane A4C EF: 63.5 %
Weight: 3872 oz

## 2021-01-07 MED ORDER — METHYLPREDNISOLONE SODIUM SUCC 40 MG IJ SOLR
40.0000 mg | Freq: Two times a day (BID) | INTRAMUSCULAR | Status: DC
  Administered 2021-01-07 – 2021-01-10 (×6): 40 mg via INTRAVENOUS
  Filled 2021-01-07 (×6): qty 1

## 2021-01-07 MED ORDER — FLUTICASONE PROPIONATE 50 MCG/ACT NA SUSP
2.0000 | Freq: Every day | NASAL | Status: DC
  Administered 2021-01-07 – 2021-01-10 (×3): 2 via NASAL
  Filled 2021-01-07: qty 16

## 2021-01-07 MED ORDER — PANTOPRAZOLE SODIUM 40 MG PO TBEC
40.0000 mg | DELAYED_RELEASE_TABLET | Freq: Two times a day (BID) | ORAL | Status: DC
  Administered 2021-01-07 – 2021-01-10 (×6): 40 mg via ORAL
  Filled 2021-01-07 (×6): qty 1

## 2021-01-07 MED ORDER — LORATADINE 10 MG PO TABS
10.0000 mg | ORAL_TABLET | Freq: Every day | ORAL | Status: DC
  Administered 2021-01-07 – 2021-01-10 (×4): 10 mg via ORAL
  Filled 2021-01-07 (×4): qty 1

## 2021-01-07 MED ORDER — IPRATROPIUM-ALBUTEROL 0.5-2.5 (3) MG/3ML IN SOLN
3.0000 mL | Freq: Four times a day (QID) | RESPIRATORY_TRACT | Status: DC
  Administered 2021-01-07 – 2021-01-10 (×14): 3 mL via RESPIRATORY_TRACT
  Filled 2021-01-07 (×14): qty 3

## 2021-01-07 MED ORDER — FUROSEMIDE 10 MG/ML IJ SOLN
40.0000 mg | Freq: Once | INTRAMUSCULAR | Status: AC
  Administered 2021-01-07: 40 mg via INTRAVENOUS
  Filled 2021-01-07: qty 4

## 2021-01-07 NOTE — Progress Notes (Signed)
PROGRESS NOTE  Ralph Mack FOY:[id number] DOB: 1965/12/06 DOA: 01/06/2021 PCP: [name], PA-C   LOS: 1 day   Brief narrative: Patient is a 55 years old male with history of hypertension, hyperlipidemia, morbid obesity, chronic smoking presented to the hospital with increasing shortness of breath for few weeks.  Patient had seen PCP on 12/18/2020 and was given Z-Pak and doxycycline with prednisone taper.  He had some improvement but his symptoms did not completely abate.  He then decided to come to the hospital.  In the ED, the patient was 70% on room air and placed on 15 L/min NRB.  He admitted to orthopnea and intermittent leg swelling, productive cough without hemoptysis, dyspnea on exertion, easily fatigued.  In the ED patient was tachypneic, hypotensive, hypoxic.  He had mild hyponatremia with sodium of 133.  No leukocytosis.  COVID-19 and flu was negative.  Troponins were negative.  Phillip Heal of the chest was negative for PE but some infiltrate.  There was some pulmonary nodules noted as well.  Several mildly enlarged lymph nodes.  There as well.  Patient received Solu-Medrol albuterol and then was considered for admission to hospital for acute hypoxic respiratory failure/  Assessment/Plan:  Principal Problem:   Acute respiratory failure with hypoxia ([place]) Active Problems:   Smoker   Hypothyroidism   Dyslipidemia   Essential hypertension   Morbid (severe) obesity due to excess calories ([place])  Acute hypoxic respiratory failure suspect secondary to undiagnosed COPD exacerbation Patient was hypoxic to 67% on room air at presentation currently on  5 L/min.  CTA of the chest was done which did not show any evidence of pulmonary embolism but possible infiltrate over the left lower lobe and inferior lingula consistent with pneumonia as well as right basilar atelectasis.  Patient has recently completed azithromycin doxycycline, is afebrile and leukocytosis is absent.  Procalcitonin is  negative.  We will continue to hold off with antibiotic.  Continue with nebulizer, IV steroids, incentive spirometry and flutter valve. Recommend outpatient pulmonology referral at discharge.  Will check 2D echocardiogram.  We will add Claritin and Protonix for possible reflux.  We will add Flonase nasal spray as well.  BNP was 72.3.  Will give 1 dose of IV Lasix today empirically.  Essential hypertension Was on lisinopril HCTZ.. Continue needed hydralazine for now  Hyperlipidemia Not on medications.  Morbid obesity Body mass index is 37.9 kg/m.  Would benefit from weight loss as outpatient.  Would benefit from sleep apnea evaluation as outpatient as well  Hypothyroidism Continue levothyroxine  LUL and RUL pulmonary nodules As per CT scan recommendation.  Plan is repeat CT in 6 to 12 months.   Tobacco use States that he felt nicotine patch nicotine gums in the past.  DVT prophylaxis: Lovenox subcu  Code Status: Full code  Family Communication: None  Status is: Inpatient  Remains inpatient appropriate because:IV treatments appropriate due to intensity of illness or inability to take PO and Inpatient level of care appropriate due to severity of illness   Dispo: The patient is from: Home              Anticipated d/c is to: Home likely in 1 to 2 days.  We will continue to wean oxygen as able.              Patient currently is not medically stable to d/c.   Difficult to place patient No   Consultants:  None  Procedures:  None  Anti-infectives:  . None  Anti-infectives (From admission, onward)   None     Subjective: Today, patient was seen and examined at bedside.  Patient feels a little better with breathing today.  Denies any chest pain, dizziness, lightheadedness.  Still on 5 L of oxygen at the time of my evaluation.  Objective: Vitals:   01/07/21 0717 01/07/21 1118  BP:    Pulse:    Resp:    Temp:    SpO2: 91% 97%    Intake/Output Summary (Last  24 hours) at 01/07/2021 1131 Last data filed at 01/07/2021 0447 Gross per 24 hour  Intake 240 ml  Output 1000 ml  Net -760 ml   Filed Weights   01/06/21 0934  Weight: 109.8 kg   Body mass index is 37.9 kg/m.   Physical Exam: GENERAL: Patient is alert awake and oriented. Not in obvious distress.  Obese.  On nasal cannula at 5 L/min HENT: No scleral pallor or icterus. Pupils equally reactive to light. Oral mucosa is moist NECK: is supple, no gross swelling noted. CHEST: Clear to auscultation.  Wheezes bilaterally.  Diminished breath sounds bilaterally. CVS: S1 and S2 heard, no murmur. Regular rate and rhythm.  ABDOMEN: Soft, non-tender, bowel sounds are present. EXTREMITIES: No edema. CNS: Cranial nerves are intact. No focal motor deficits. SKIN: warm and dry without rashes.  Data Review: I have personally reviewed the following laboratory data and studies,  CBC: Recent Labs  Lab 01/06/21 0932 01/07/21 0519  WBC 5.7 6.2  NEUTROABS 3.5  --   HGB 14.2 14.0  HCT 43.0 44.4  MCV 98.6 102.8*  PLT 143* 470*   Basic Metabolic Panel: Recent Labs  Lab 01/06/21 0932 01/07/21 0519  NA 133* 137  K 4.5 4.7  CL 91* 93*  CO2 34* 36*  GLUCOSE 106* 174*  BUN 14 15  CREATININE 0.58* 0.69  CALCIUM 8.5* 8.9   Liver Function Tests: Recent Labs  Lab 01/06/21 0932  AST 22  ALT 23  ALKPHOS 55  BILITOT 0.9  PROT 6.7  ALBUMIN 3.8   No results for input(s): LIPASE, AMYLASE in the last 168 hours. No results for input(s): AMMONIA in the last 168 hours. Cardiac Enzymes: No results for input(s): CKTOTAL, CKMB, CKMBINDEX, TROPONINI in the last 168 hours. BNP (last 3 results) Recent Labs    01/06/21 0932  BNP 72.3    ProBNP (last 3 results) No results for input(s): PROBNP in the last 8760 hours.  CBG: No results for input(s): GLUCAP in the last 168 hours. Recent Results (from the past 240 hour(s))  Resp Panel by RT-PCR (Flu A&B, Covid) Nasopharyngeal Swab     Status: None    Collection Time: 01/06/21  9:25 AM   Specimen: Nasopharyngeal Swab; Nasopharyngeal(NP) swabs in vial transport medium  Result Value Ref Range Status   SARS Coronavirus 2 by RT PCR NEGATIVE NEGATIVE Final    Comment: (NOTE) SARS-CoV-2 target nucleic acids are NOT DETECTED.  The SARS-CoV-2 RNA is generally detectable in upper respiratory specimens during the acute phase of infection. The lowest concentration of SARS-CoV-2 viral copies this assay can detect is 138 copies/mL. A negative result does not preclude SARS-Cov-2 infection and should not be used as the sole basis for treatment or other patient management decisions. A negative result may occur with  improper specimen collection/handling, submission of specimen other than nasopharyngeal swab, presence of viral mutation(s) within the areas targeted by this assay, and inadequate number of viral copies(<138 copies/mL). A negative result must be combined with  clinical observations, patient history, and epidemiological information. The expected result is Negative.  Fact Sheet for Patients:  EntrepreneurPulse.com.au  Fact Sheet for Healthcare Providers:  IncredibleEmployment.be  This test is no t yet approved or cleared by the [place] FDA and  has been authorized for detection and/or diagnosis of SARS-CoV-2 by FDA under an Emergency Use Authorization (EUA). This EUA will remain  in effect (meaning this test can be used) for the duration of the COVID-19 declaration under Section 564(b)(1) of the Act, 21 U.S.C.section 360bbb-3(b)(1), unless the authorization is terminated  or revoked sooner.       Influenza A by PCR NEGATIVE NEGATIVE Final   Influenza B by PCR NEGATIVE NEGATIVE Final    Comment: (NOTE) The Xpert Xpress SARS-CoV-2/FLU/RSV plus assay is intended as an aid in the diagnosis of influenza from Nasopharyngeal swab specimens and should not be used as a sole basis for treatment.  Nasal washings and aspirates are unacceptable for Xpert Xpress SARS-CoV-2/FLU/RSV testing.  Fact Sheet for Patients: EntrepreneurPulse.com.au  Fact Sheet for Healthcare Providers: IncredibleEmployment.be  This test is not yet approved or cleared by the [place] FDA and has been authorized for detection and/or diagnosis of SARS-CoV-2 by FDA under an Emergency Use Authorization (EUA). This EUA will remain in effect (meaning this test can be used) for the duration of the COVID-19 declaration under Section 564(b)(1) of the Act, 21 U.S.C. section 360bbb-3(b)(1), unless the authorization is terminated or revoked.  Performed at [facility], [place] [address], [place], [place] [address]   MRSA PCR Screening     Status: None   Collection Time: 01/06/21  4:31 PM   Specimen: Nasopharyngeal  Result Value Ref Range Status   MRSA by PCR NEGATIVE NEGATIVE Final    Comment:        The GeneXpert MRSA Assay (FDA approved for NASAL specimens only), is one component of a comprehensive MRSA colonization surveillance program. It is not intended to diagnose MRSA infection nor to guide or monitor treatment for MRSA infections. Performed at [facility], [place] [address], [place],  [address]      Studies: CT Angio Chest PE W and/or Wo Contrast  Result Date: 01/06/2021 CLINICAL DATA:  Shortness of breath EXAM: CT ANGIOGRAPHY CHEST WITH CONTRAST TECHNIQUE: Multidetector CT imaging of the chest was performed using the standard protocol during bolus administration of intravenous contrast. Multiplanar CT image reconstructions and MIPs were obtained to evaluate the vascular anatomy. CONTRAST:  80mL OMNIPAQUE IOHEXOL 350 MG/ML SOLN COMPARISON:  January 06, 2021 FINDINGS: Cardiovascular: There is no demonstrable pulmonary embolus. There is no evident thoracic aortic aneurysm or dissection. Visualized great vessels appear  unremarkable. There are foci coronary artery calcification. There is no pericardial effusion or pericardial thickening. Mediastinum/Nodes: Thyroid appears unremarkable. There is a lymph node in the aortopulmonary window region measuring 1.4 x 1.3 cm. There is a lymph node to the right of the distal trachea measuring 1.3 x 1.1 cm. There is a subcarinal lymph node measuring 1.5 x 1.4 cm. A lymph node in the right hilum measures 1.0 x 1.0 cm. Several subcentimeter lymph nodes also noted in the mediastinum. No esophageal lesions are appreciable. Lungs/Pleura: There are areas of patchy airspace opacity in the left lower lobe and inferior lingula. On axial slice 72 series 5, there is an 8 x 7 mm nodular opacity in the posterior segment of the left upper lobe near the major fissure. On axial slice 72 series 5, there is a  5 x 4 mm nodular opacity in the anterior segment of the right upper lobe. There is atelectatic change in the right lung base. Trachea and major bronchial structures appear normal. No pleural effusion. No pneumothorax. Upper Abdomen: Visualized upper abdominal structures appear unremarkable. Musculoskeletal: No blastic or lytic bone lesions evident. No chest wall lesions. Review of the MIP images confirms the above findings. IMPRESSION: 1. No demonstrable pulmonary embolus. No thoracic aortic aneurysm or dissection. There are foci of coronary artery calcification. 2. Areas of airspace opacity in the left lower lobe and inferior lingula consistent with pneumonia. Right base atelectasis. 3. 8 x 7 mm nodular opacity in the posterior segment left upper lobe near the major fissure. There is a 5 x 4 mm nodular opacity in the anterior segment of the right upper lobe. Non-contrast chest CT at 6-12 months is recommended. If the nodule is stable at time of repeat CT, then future CT at 18-24 months (from today's scan) is considered optional for low-risk patients, but is recommended for high-risk patients. This  recommendation follows the consensus statement: Guidelines for Management of Incidental Pulmonary Nodules Detected on CT Images: From the Fleischner Society 2017; Radiology 2017; 284:228-243. 4. Several mildly enlarged lymph nodes of uncertain etiology. Lymph node prominence of this nature potentially could have reactive etiology given the areas of pneumonia. Neoplastic etiology cannot be excluded in this circumstance, however. Electronically Signed   By: Lowella Grip III M.D.   On: 01/06/2021 11:59   DG Chest Portable 1 View  Result Date: 01/06/2021 CLINICAL DATA:  55 year old male with history of shortness of breath, cough and chest pain. EXAM: PORTABLE CHEST 1 VIEW COMPARISON:  Chest x-ray 12/06/2020. FINDINGS: Lung volumes are normal. Ill-defined opacities are noted throughout the left lung base. No pleural effusions. No pneumothorax. No pulmonary nodule or mass noted. Pulmonary vasculature and the cardiomediastinal silhouette are within normal limits. IMPRESSION: 1. Ill-defined opacities at the left base concerning for potential bronchopneumonia. Followup PA and lateral chest X-ray is recommended in 3-4 weeks following trial of antibiotic therapy to ensure resolution and exclude underlying malignancy. Electronically Signed   By: Vinnie Langton M.D.   On: 01/06/2021 10:48     Flora Lipps, MD  [facility] 01/07/2021  If 7PM-7AM, please contact night-coverage

## 2021-01-07 NOTE — Progress Notes (Signed)
RT decreased O2 to 5L [place] on first eound and on 2nd rounds Pt was 97% on 5l [place]. RT decreased O2 3L [place]. RT will continue to monitor

## 2021-01-07 NOTE — Progress Notes (Signed)
  Echocardiogram 2D Echocardiogram has been performed.  Bobbye Charleston 01/07/2021, 12:13 PM

## 2021-01-07 NOTE — Plan of Care (Signed)
  Problem: Education: Goal: Knowledge of General Education information will improve Description: Including pain rating scale, medication(s)/side effects and non-pharmacologic comfort measures Outcome: Completed/Met   Problem: Clinical Measurements: Goal: Ability to maintain clinical measurements within normal limits will improve Outcome: Completed/Met   Problem: Clinical Measurements: Goal: Respiratory complications will improve Outcome: Completed/Met   Problem: Activity: Goal: Risk for activity intolerance will decrease Outcome: Completed/Met

## 2021-01-08 ENCOUNTER — Encounter: Payer: Self-pay | Admitting: Urology

## 2021-01-08 ENCOUNTER — Ambulatory Visit: Payer: Self-pay | Admitting: Urology

## 2021-01-08 DIAGNOSIS — I4892 Unspecified atrial flutter: Secondary | ICD-10-CM

## 2021-01-08 LAB — BASIC METABOLIC PANEL
Anion gap: 7 (ref 5–15)
BUN: 19 mg/dL (ref 6–20)
CO2: 39 mmol/L — ABNORMAL HIGH (ref 22–32)
Calcium: 9.1 mg/dL (ref 8.9–10.3)
Chloride: 92 mmol/L — ABNORMAL LOW (ref 98–111)
Creatinine, Ser: 0.66 mg/dL (ref 0.61–1.24)
GFR, Estimated: >60 mL/min (ref >60)
Glucose, Bld: 134 mg/dL — ABNORMAL HIGH (ref 70–99)
Potassium: 5 mmol/L (ref 3.5–5.1)
Sodium: 138 mmol/L (ref 135–145)

## 2021-01-08 LAB — CBC
HCT: 42 % (ref 39.0–52.0)
Hemoglobin: 13.2 g/dL (ref 13.0–17.0)
MCH: 32.5 pg (ref 26.0–34.0)
MCHC: 31.4 g/dL (ref 30.0–36.0)
MCV: 103.4 fL — ABNORMAL HIGH (ref 80.0–100.0)
Platelets: 145 10*3/uL — ABNORMAL LOW (ref 150–400)
RBC: 4.06 MIL/uL — ABNORMAL LOW (ref 4.22–5.81)
RDW: 12.3 % (ref 11.5–15.5)
WBC: 10.4 10*3/uL (ref 4.0–10.5)
nRBC: 0 % (ref 0.0–0.2)

## 2021-01-08 LAB — MAGNESIUM: Magnesium: 2.1 mg/dL (ref 1.7–2.4)

## 2021-01-08 LAB — PHOSPHORUS: Phosphorus: 3.8 mg/dL (ref 2.5–4.6)

## 2021-01-08 MED ORDER — NICOTINE 21 MG/24HR TD PT24
21.0000 mg | MEDICATED_PATCH | Freq: Every day | TRANSDERMAL | Status: DC
  Administered 2021-01-08 – 2021-01-10 (×3): 21 mg via TRANSDERMAL
  Filled 2021-01-08 (×3): qty 1

## 2021-01-08 MED ORDER — DILTIAZEM HCL 30 MG PO TABS
30.0000 mg | ORAL_TABLET | Freq: Four times a day (QID) | ORAL | Status: DC
  Administered 2021-01-08 – 2021-01-09 (×3): 30 mg via ORAL
  Filled 2021-01-08 (×3): qty 1

## 2021-01-08 MED ORDER — METOPROLOL TARTRATE 5 MG/5ML IV SOLN
5.0000 mg | Freq: Once | INTRAVENOUS | Status: AC
  Administered 2021-01-08: 5 mg via INTRAVENOUS
  Filled 2021-01-08: qty 5

## 2021-01-08 MED ORDER — BUDESONIDE 0.25 MG/2ML IN SUSP
0.2500 mg | Freq: Two times a day (BID) | RESPIRATORY_TRACT | Status: DC
  Administered 2021-01-08 – 2021-01-10 (×5): 0.25 mg via RESPIRATORY_TRACT
  Filled 2021-01-08 (×4): qty 2

## 2021-01-08 MED ORDER — AZITHROMYCIN 250 MG PO TABS
500.0000 mg | ORAL_TABLET | Freq: Every day | ORAL | Status: AC
  Administered 2021-01-08 – 2021-01-10 (×3): 500 mg via ORAL
  Filled 2021-01-08 (×3): qty 2

## 2021-01-08 MED ORDER — FUROSEMIDE 10 MG/ML IJ SOLN
40.0000 mg | Freq: Once | INTRAMUSCULAR | Status: AC
  Administered 2021-01-08: 40 mg via INTRAVENOUS
  Filled 2021-01-08: qty 4

## 2021-01-08 NOTE — Plan of Care (Signed)

## 2021-01-08 NOTE — Progress Notes (Addendum)
PROGRESS NOTE  Ralph Mack UTM:[id number] DOB: 09/10/1966 DOA: 01/06/2021 PCP: [name], PA-C   LOS: 2 days   Brief narrative: Patient is a 55 years old male with history of hypertension, hyperlipidemia, morbid obesity, chronic smoking presented to the hospital with increasing shortness of breath for few weeks.  Patient had seen PCP on 12/18/2020 and was given Z-Pak and doxycycline with prednisone taper.  He had some improvement but his symptoms did not completely abate.  He then decided to come to the hospital.  In the ED, the patient was 70% on room air and placed on 15 L/min NRB.  He admitted to orthopnea and intermittent leg swelling, productive cough without hemoptysis, dyspnea on exertion, easily fatigued.  In the ED patient was tachypneic, hypotensive, hypoxic.  He had mild hyponatremia with sodium of 133.  No leukocytosis.  COVID-19 and flu was negative.  Troponins were negative.  CTA of the chest was negative for PE but some infiltrate.  There was some pulmonary nodules noted as well.  Several mildly enlarged lymph nodes.  There as well.  Patient received Solu-Medrol, albuterol and then was considered for admission to hospital for acute hypoxic respiratory failure.  Assessment/Plan:  Principal Problem:   Acute respiratory failure with hypoxia (HCC) Active Problems:   Smoker   Hypothyroidism   Dyslipidemia   Essential hypertension   Morbid (severe) obesity due to excess calories ([place])  Acute hypoxic respiratory failure suspect secondary to undiagnosed COPD exacerbation  Patient was hypoxic to 67% on room air at presentation, currently on  3 L/min.  Patient quickly desaturated this morning.  CTA of the chest was done which did not show any evidence of pulmonary embolism but possible infiltrate over the left lower lobe and inferior lingula consistent with pneumonia as well as right basilar atelectasis.  Patient has recently completed azithromycin, doxycycline, is afebrile and  leukocytosis is absent.  Procalcitonin is negative.  We will continue to hold off with antibiotic.  Patient might need assessment for obstructive sleep apnea assessment as outpatient as well.  Continue with nebulizer, IV steroids, incentive spirometry and flutter valve.  Add budesonide inhaler.  2D echocardiogram showed LV ejection fraction of 60 to 65% with grade 2 diastolic dysfunction.  Received 40 mg of Lasix yesterday.  We will add 1 dose of Lasix again today.  Continue Claritin and Protonix for possible reflux.  Continue Flonase nasal spray as well.  BNP was 72.3 but patient is obese.  Add Zithromax as well.  Will consider for pulmonary evaluation if unable to wean oxygen.  Essential hypertension Was on lisinopril HCTZ amlodipine. Continue needed hydralazine for now.  Resume lisinopril HCTZ.  Hyperlipidemia Not on medications.  Will check lipid panel.  Morbid obesity Body mass index is 37.9 kg/m.  Would benefit from weight loss as outpatient.  Would benefit from sleep apnea evaluation as outpatient as well  Hypothyroidism Continue levothyroxine  LUL and RUL pulmonary nodules As per CT scan recommendation.  Plan is repeat CT in 6 to 12 months.   Tobacco use States that he failed nicotine patch nicotine gums in the past.  Patient wishes to quit at this time.  Addendum:  01/08/2021 2:26 PM  Nursing staff reported that the patient had tachycardia.  EKG obtained which showed possible atrial flutter.  Received IV metoprolol with improvement in heart rate.  Will get cardiology consultation.  2D echocardiogram with preserved LV function and diastolic dysfunction. TSH 2 days back was 2.9.  Due to underlying severe COPD  will consider p.o. Cardizem every 6 hourly for now.  Will closely monitor overnight.  Follow cardiology recommendation  DVT prophylaxis: Lovenox subcu  Code Status: Full code  Family Communication: None  Status is: Inpatient  Remains inpatient appropriate because:IV  treatments appropriate due to intensity of illness or inability to take PO and Inpatient level of care appropriate due to severity of illness, hypoxic respiratory failure on supplemental oxygen,   Dispo: The patient is from: Home              Anticipated d/c is to: Home likely in 1 to 2 days.  We will continue to wean oxygen as able.  Might need oxygen supplementation if unable to wean.              Patient currently is not medically stable to d/c.   Difficult to place patient No   Consultants:  None  Procedures:  None  Anti-infectives:  . None  Anti-infectives (From admission, onward)   None     Subjective: Today, patient was seen and examined at bedside.  Feels little better with 80% on room air when trying to wean off.  Has mild cough and wheezing.  Denies chest pain. Objective: Vitals:   01/08/21 0740 01/08/21 0809  BP:    Pulse: 92 91  Resp: (!) 22 20  Temp:    SpO2: (!) 80% 92%    Intake/Output Summary (Last 24 hours) at 01/08/2021 1013 Last data filed at 01/08/2021 0600 Gross per 24 hour  Intake 840 ml  Output 1650 ml  Net -810 ml   Filed Weights   01/06/21 0934  Weight: 109.8 kg   Body mass index is 37.9 kg/m.   Physical Exam: General: Obese built, not in obvious distress, on nasal cannula 3 L/min. HENT:   No scleral pallor or icterus noted. Oral mucosa is moist.  Chest: Mild wheezing needed.  Diminished breath sounds bilaterally. CVS: S1 &S2 heard. No murmur.  Regular rate and rhythm. Abdomen: Soft, nontender, nondistended.  Bowel sounds are heard.   Extremities: No cyanosis, clubbing or edema.  Peripheral pulses are palpable. Psych: Alert, awake and oriented, normal mood CNS:  No cranial nerve deficits.  Power equal in all extremities.   Skin: Warm and dry.  No rashes noted.  Data Review: I have personally reviewed the following laboratory data and studies,  CBC: Recent Labs  Lab 01/06/21 0932 01/07/21 0519 01/08/21 0530  WBC 5.7 6.2 10.4   NEUTROABS 3.5  --   --   HGB 14.2 14.0 13.2  HCT 43.0 44.4 42.0  MCV 98.6 102.8* 103.4*  PLT 143* 147* 179*   Basic Metabolic Panel: Recent Labs  Lab 01/06/21 0932 01/07/21 0519 01/08/21 0530  NA 133* 137 138  K 4.5 4.7 5.0  CL 91* 93* 92*  CO2 34* 36* 39*  GLUCOSE 106* 174* 134*  BUN 14 15 19   CREATININE 0.58* 0.69 0.66  CALCIUM 8.5* 8.9 9.1  MG  --   --  2.1  PHOS  --   --  3.8   Liver Function Tests: Recent Labs  Lab 01/06/21 0932  AST 22  ALT 23  ALKPHOS 55  BILITOT 0.9  PROT 6.7  ALBUMIN 3.8   No results for input(s): LIPASE, AMYLASE in the last 168 hours. No results for input(s): AMMONIA in the last 168 hours. Cardiac Enzymes: No results for input(s): CKTOTAL, CKMB, CKMBINDEX, TROPONINI in the last 168 hours. BNP (last 3 results) Recent Labs  01/06/21 0932  BNP 72.3    ProBNP (last 3 results) No results for input(s): PROBNP in the last 8760 hours.  CBG: No results for input(s): GLUCAP in the last 168 hours. Recent Results (from the past 240 hour(s))  Resp Panel by RT-PCR (Flu A&B, Covid) Nasopharyngeal Swab     Status: None   Collection Time: 01/06/21  9:25 AM   Specimen: Nasopharyngeal Swab; Nasopharyngeal(NP) swabs in vial transport medium  Result Value Ref Range Status   SARS Coronavirus 2 by RT PCR NEGATIVE NEGATIVE Final    Comment: (NOTE) SARS-CoV-2 target nucleic acids are NOT DETECTED.  The SARS-CoV-2 RNA is generally detectable in upper respiratory specimens during the acute phase of infection. The lowest concentration of SARS-CoV-2 viral copies this assay can detect is 138 copies/mL. A negative result does not preclude SARS-Cov-2 infection and should not be used as the sole basis for treatment or other patient management decisions. A negative result may occur with  improper specimen collection/handling, submission of specimen other than nasopharyngeal swab, presence of viral mutation(s) within the areas targeted by this assay, and  inadequate number of viral copies(<138 copies/mL). A negative result must be combined with clinical observations, patient history, and epidemiological information. The expected result is Negative.  Fact Sheet for Patients:  EntrepreneurPulse.com.au  Fact Sheet for Healthcare Providers:  IncredibleEmployment.be  This test is no t yet approved or cleared by the [place] FDA and  has been authorized for detection and/or diagnosis of SARS-CoV-2 by FDA under an Emergency Use Authorization (EUA). This EUA will remain  in effect (meaning this test can be used) for the duration of the COVID-19 declaration under Section 564(b)(1) of the Act, 21 U.S.C.section 360bbb-3(b)(1), unless the authorization is terminated  or revoked sooner.       Influenza A by PCR NEGATIVE NEGATIVE Final   Influenza B by PCR NEGATIVE NEGATIVE Final    Comment: (NOTE) The Xpert Xpress SARS-CoV-2/FLU/RSV plus assay is intended as an aid in the diagnosis of influenza from Nasopharyngeal swab specimens and should not be used as a sole basis for treatment. Nasal washings and aspirates are unacceptable for Xpert Xpress SARS-CoV-2/FLU/RSV testing.  Fact Sheet for Patients: EntrepreneurPulse.com.au  Fact Sheet for Healthcare Providers: IncredibleEmployment.be  This test is not yet approved or cleared by the [place] FDA and has been authorized for detection and/or diagnosis of SARS-CoV-2 by FDA under an Emergency Use Authorization (EUA). This EUA will remain in effect (meaning this test can be used) for the duration of the COVID-19 declaration under Section 564(b)(1) of the Act, 21 U.S.C. section 360bbb-3(b)(1), unless the authorization is terminated or revoked.  Performed at [facility], [place] [address] [place], [place] [address]   MRSA PCR Screening     Status: None   Collection Time: 01/06/21  4:31 PM    Specimen: Nasopharyngeal  Result Value Ref Range Status   MRSA by PCR NEGATIVE NEGATIVE Final    Comment:        The GeneXpert MRSA Assay (FDA approved for NASAL specimens only), is one component of a comprehensive MRSA colonization surveillance program. It is not intended to diagnose MRSA infection nor to guide or monitor treatment for MRSA infections. Performed at [facility], [place] [address], [place], [place] [address]      Studies: CT Angio Chest PE W and/or Wo Contrast  Result Date: 01/06/2021 CLINICAL DATA:  Shortness of breath EXAM: CT ANGIOGRAPHY CHEST WITH CONTRAST TECHNIQUE: Multidetector CT imaging of  the chest was performed using the standard protocol during bolus administration of intravenous contrast. Multiplanar CT image reconstructions and MIPs were obtained to evaluate the vascular anatomy. CONTRAST:  21mL OMNIPAQUE IOHEXOL 350 MG/ML SOLN COMPARISON:  January 06, 2021 FINDINGS: Cardiovascular: There is no demonstrable pulmonary embolus. There is no evident thoracic aortic aneurysm or dissection. Visualized great vessels appear unremarkable. There are foci coronary artery calcification. There is no pericardial effusion or pericardial thickening. Mediastinum/Nodes: Thyroid appears unremarkable. There is a lymph node in the aortopulmonary window region measuring 1.4 x 1.3 cm. There is a lymph node to the right of the distal trachea measuring 1.3 x 1.1 cm. There is a subcarinal lymph node measuring 1.5 x 1.4 cm. A lymph node in the right hilum measures 1.0 x 1.0 cm. Several subcentimeter lymph nodes also noted in the mediastinum. No esophageal lesions are appreciable. Lungs/Pleura: There are areas of patchy airspace opacity in the left lower lobe and inferior lingula. On axial slice 72 series 5, there is an 8 x 7 mm nodular opacity in the posterior segment of the left upper lobe near the major fissure. On axial slice 72 series 5, there is a 5 x 4 mm nodular opacity in  the anterior segment of the right upper lobe. There is atelectatic change in the right lung base. Trachea and major bronchial structures appear normal. No pleural effusion. No pneumothorax. Upper Abdomen: Visualized upper abdominal structures appear unremarkable. Musculoskeletal: No blastic or lytic bone lesions evident. No chest wall lesions. Review of the MIP images confirms the above findings. IMPRESSION: 1. No demonstrable pulmonary embolus. No thoracic aortic aneurysm or dissection. There are foci of coronary artery calcification. 2. Areas of airspace opacity in the left lower lobe and inferior lingula consistent with pneumonia. Right base atelectasis. 3. 8 x 7 mm nodular opacity in the posterior segment left upper lobe near the major fissure. There is a 5 x 4 mm nodular opacity in the anterior segment of the right upper lobe. Non-contrast chest CT at 6-12 months is recommended. If the nodule is stable at time of repeat CT, then future CT at 18-24 months (from today's scan) is considered optional for low-risk patients, but is recommended for high-risk patients. This recommendation follows the consensus statement: Guidelines for Management of Incidental Pulmonary Nodules Detected on CT Images: From the Fleischner Society 2017; Radiology 2017; 284:228-243. 4. Several mildly enlarged lymph nodes of uncertain etiology. Lymph node prominence of this nature potentially could have reactive etiology given the areas of pneumonia. Neoplastic etiology cannot be excluded in this circumstance, however. Electronically Signed   By: Lowella Grip III M.D.   On: 01/06/2021 11:59   ECHOCARDIOGRAM COMPLETE  Result Date: 01/07/2021    ECHOCARDIOGRAM REPORT   Patient Name:   Cezar Shed Date of Exam: 01/07/2021 Medical Rec #:  [id number]     Height:       67.0 in Accession #:    [id number]    Weight:       242.0 lb Date of Birth:  1965-11-20     BSA:          2.193 m Patient Age:    30 years      BP:           158/82 mmHg  Patient Gender: M             HR:           73 bpm. Exam Location:  Inpatient Procedure: 2D Echo, 3D Echo,  Cardiac Doppler and Color Doppler Indications:    R06.02 SOB  History:        Patient has no prior history of Echocardiogram examinations.                 Signs/Symptoms:Chest Pain, Shortness of Breath and Dyspnea; Risk                 Factors:Current Smoker, Hypertension and Dyslipidemia. Substance                 abuse. ETOH.  Sonographer:    Roseanna Rainbow RDCS Referring Phys: [id number] Loraine  Sonographer Comments: Patient is morbidly obese and Technically difficult study due to poor echo windows. Image acquisition challenging due to patient body habitus. IMPRESSIONS  1. Left ventricular ejection fraction, by estimation, is 60 to 65%. Left ventricular ejection fraction by 3D volume is 67 %. The left ventricle has normal function. The left ventricle has no regional wall motion abnormalities. There is mild-to-moderate concentric left ventricular hypertrophy. Left ventricular diastolic parameters are consistent with Grade II diastolic dysfunction (pseudonormalization).  2. Right ventricular systolic function is normal. The right ventricular size is normal.  3. The mitral valve is normal in structure. Trivial mitral valve regurgitation. No evidence of mitral stenosis.  4. The aortic valve is tricuspid. Aortic valve regurgitation is not visualized. No aortic stenosis is present.  5. Aortic dilatation noted. There is mild dilatation of the ascending aorta, measuring 41 mm.  6. The inferior vena cava is dilated in size with <50% respiratory variability, suggesting right atrial pressure of 15 mmHg. Comparison(s): No prior Echocardiogram. FINDINGS  Left Ventricle: Left ventricular ejection fraction, by estimation, is 60 to 65%. Left ventricular ejection fraction by 3D volume is 67 %. The left ventricle has normal function. The left ventricle has no regional wall motion abnormalities. The left ventricular internal  cavity size was normal in size. There is mild-to-moderate concentric left ventricular hypertrophy. Left ventricular diastolic parameters are consistent with Grade II diastolic dysfunction (pseudonormalization). Right Ventricle: The right ventricular size is normal. No increase in right ventricular wall thickness. Right ventricular systolic function is normal. Left Atrium: Left atrial size was normal in size. Right Atrium: Right atrial size was normal in size. Pericardium: There is no evidence of pericardial effusion. Mitral Valve: The mitral valve is normal in structure. There is mild thickening of the mitral valve leaflet(s). There is mild calcification of the mitral valve leaflet(s). Mild mitral annular calcification. Trivial mitral valve regurgitation. No evidence  of mitral valve stenosis. Tricuspid Valve: The tricuspid valve is normal in structure. Tricuspid valve regurgitation is trivial. Aortic Valve: The aortic valve is tricuspid. Aortic valve regurgitation is not visualized. No aortic stenosis is present. Pulmonic Valve: The pulmonic valve was not well visualized. Pulmonic valve regurgitation is trivial. Aorta: Aortic dilatation noted. There is mild dilatation of the ascending aorta, measuring 41 mm. Venous: The inferior vena cava is dilated in size with less than 50% respiratory variability, suggesting right atrial pressure of 15 mmHg. IAS/Shunts: No atrial level shunt detected by color flow Doppler.  LEFT VENTRICLE PLAX 2D LVIDd:         5.70 cm         Diastology LVIDs:         3.60 cm         LV e' medial:    7.07 cm/s LV PW:         1.20 cm  LV E/e' medial:  19.1 LV IVS:        1.60 cm         LV e' lateral:   7.62 cm/s LVOT diam:     2.20 cm         LV E/e' lateral: 17.7 LV SV:         139 LV SV Index:   63 LVOT Area:     3.80 cm        3D Volume EF                                LV 3D EF:    Left                                             ventricular LV Volumes (MOD)                             ejection LV vol d, MOD    147.0 ml                   fraction by A2C:                                        3D volume LV vol d, MOD    115.0 ml                   is 67 %. A4C: LV vol s, MOD    44.7 ml A2C:                           3D Volume EF: LV vol s, MOD    42.0 ml       3D EF:        67 % A4C: LV SV MOD A2C:   102.3 ml LV SV MOD A4C:   115.0 ml LV SV MOD BP:    86.3 ml RIGHT VENTRICLE             IVC RV S prime:     17.00 cm/s  IVC diam: 2.70 cm TAPSE (M-mode): 3.0 cm LEFT ATRIUM             Index       RIGHT ATRIUM           Index LA diam:        3.70 cm 1.69 cm/m  RA Area:     17.60 cm LA Vol (A2C):   63.9 ml 29.14 ml/m RA Volume:   47.40 ml  21.62 ml/m LA Vol (A4C):   56.0 ml 25.54 ml/m LA Biplane Vol: 65.2 ml 29.73 ml/m  AORTIC VALVE LVOT Vmax:   167.00 cm/s LVOT Vmean:  107.000 cm/s LVOT VTI:    0.365 m  AORTA Ao Root diam: 3.90 cm Ao Asc diam:  4.10 cm MITRAL VALVE MV Area (PHT): 3.99 cm     SHUNTS MV Decel Time: 190 msec     Systemic VTI:  0.36 m MV E velocity: 135.00 cm/s  Systemic Diam: 2.20 cm MV A velocity: 116.00 cm/s MV E/A ratio:  1.16 Gwyndolyn Kaufman MD  Electronically signed by Gwyndolyn Kaufman MD Signature Date/Time: 01/07/2021/1:08:58 PM    Final      Flora Lipps, MD  [facility] 01/08/2021  If 7PM-7AM, please contact night-coverage

## 2021-01-08 NOTE — Progress Notes (Signed)
Pt was 97% on 3 L sitting in a chair. The Pt had done his flutter and IS right before RT came in. RT gave the Pt his neb tx and then took his oxygen off. To see what his O2 would be on RA. The Pt drooped to 80% and stated that he had been arguing with insurance and got real angry. RT placed the Pt on 2L [place] and his SATS are 89%-92%. Pt was eating and talking to his wife and eating when RT checked the Pt SATS this time.         ra

## 2021-01-09 ENCOUNTER — Encounter (HOSPITAL_COMMUNITY): Payer: Self-pay | Admitting: Internal Medicine

## 2021-01-09 ENCOUNTER — Ambulatory Visit: Payer: BC Managed Care – PPO | Admitting: Psychiatry

## 2021-01-09 DIAGNOSIS — F1010 Alcohol abuse, uncomplicated: Secondary | ICD-10-CM

## 2021-01-09 LAB — CBC
HCT: 42.4 % (ref 39.0–52.0)
Hemoglobin: 13.5 g/dL (ref 13.0–17.0)
MCH: 32.1 pg (ref 26.0–34.0)
MCHC: 31.8 g/dL (ref 30.0–36.0)
MCV: 101 fL — ABNORMAL HIGH (ref 80.0–100.0)
Platelets: 152 10*3/uL (ref 150–400)
RBC: 4.2 MIL/uL — ABNORMAL LOW (ref 4.22–5.81)
RDW: 12.5 % (ref 11.5–15.5)
WBC: 10.2 10*3/uL (ref 4.0–10.5)
nRBC: 0 % (ref 0.0–0.2)

## 2021-01-09 LAB — BASIC METABOLIC PANEL
Anion gap: 7 (ref 5–15)
BUN: 21 mg/dL — ABNORMAL HIGH (ref 6–20)
CO2: 41 mmol/L — ABNORMAL HIGH (ref 22–32)
Calcium: 9.2 mg/dL (ref 8.9–10.3)
Chloride: 89 mmol/L — ABNORMAL LOW (ref 98–111)
Creatinine, Ser: 0.76 mg/dL (ref 0.61–1.24)
GFR, Estimated: >60 mL/min (ref >60)
Glucose, Bld: 129 mg/dL — ABNORMAL HIGH (ref 70–99)
Potassium: 4.5 mmol/L (ref 3.5–5.1)
Sodium: 137 mmol/L (ref 135–145)

## 2021-01-09 LAB — PHOSPHORUS: Phosphorus: 3.8 mg/dL (ref 2.5–4.6)

## 2021-01-09 LAB — MAGNESIUM: Magnesium: 2.2 mg/dL (ref 1.7–2.4)

## 2021-01-09 MED ORDER — DILTIAZEM HCL ER COATED BEADS 120 MG PO CP24
120.0000 mg | ORAL_CAPSULE | Freq: Every day | ORAL | Status: DC
  Administered 2021-01-09 – 2021-01-10 (×2): 120 mg via ORAL
  Filled 2021-01-09 (×2): qty 1

## 2021-01-09 MED ORDER — HYDROCHLOROTHIAZIDE 12.5 MG PO CAPS
12.5000 mg | ORAL_CAPSULE | Freq: Every day | ORAL | Status: DC
  Administered 2021-01-09 – 2021-01-10 (×2): 12.5 mg via ORAL
  Filled 2021-01-09 (×2): qty 1

## 2021-01-09 MED ORDER — LOSARTAN POTASSIUM 50 MG PO TABS
50.0000 mg | ORAL_TABLET | Freq: Every day | ORAL | Status: DC
  Administered 2021-01-09 – 2021-01-10 (×2): 50 mg via ORAL
  Filled 2021-01-09 (×2): qty 1

## 2021-01-09 NOTE — Consult Note (Addendum)
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 Louanne Belton.  History of Present Illness:   Ralph Mack previously followed with Dr. Percival Spanish with reported h/o minor CAD by cath 2009 in [place]. ETT 09/2018 was negative. He wore a monitor in 09/2018 for palpitations showing NSR with episodes of nonsustained atrial flutter and rare atrial ectopy. At the time his CHADSVASC was felt to be zero so not placed on anticoagulation. A few weeks ago he developed sinus congestion and sore throat. He thought he was getting a sinus infection so called his primary care and was treated with Z-pak and prednisone taper. There was some improvement after he felt one of his ears pop but then began to develop persistent, pervasive SOB. He felt dyspnea on exertion with even mild activities walking about his home. He has history of mild atypical brief chest pains but no recent anginal type complaints. Due to persistence of symptoms he came to the ED where he was found to be tachypneic, hypotensive and hypoxic 70% on RA requiring NRB. Covid/flu panel negative. CTA showed no PE, + foci of coronary calcification, airspace opacity in the LLL/inferior lingula c/w PNA, nodular opacities in the LUL/RUL (repeat CT 6-12 months recommended), and several mildly enlarged lymph nodes of uncertain etiology. Troponins neg x1, BNP normal, TSH wnl. He  received 2 doses of IV Lasix earlier this admission. He's been treated for PNA with undiagnosed COPD with exacerbation and is feeling better today. He is thrilled that he can lie flat which he states he usually cannot do chronically. Yesterday he had an episode of tachycardia for around an hour that was consistent with rapid atrial flutter with 2:1 conduction (occasionally breaking slower on telemetry to reveal flutter waves). It lasted about 45 minutes before spontaneously converting back to NSR. He was started on short-acting diltiazem and remains in NSR. He thinks he felt this at the time. States he's felt this in the past as well but always attributed it to eating broccoli or spinach. He explains that he's been somewhat emotional about this admission because his mother suffered from heart and lung problems. He feels motivated to quit smoking. Also drinks 2-3 drinks (12-24oz) 3x per week. 2D Echo 01/07/21 showed EF 60-65%, no RWMA, mild-moderate LVH, grade 2 DD, normal RV, mild dilation of ascending aorta, dilated IVC. Pt denies prior h/o TIA/stroke or bleeding problems.   Past Medical History:  Diagnosis Date  . Allergy   . Arthritis   . Hx of colonic polyp 11/13/2016   10/2016 7 mm ssp/a - recall 2023  . Hypertension   . Hypothyroidism   . Lymphocytic colitis 04/19/2019  . Mild CAD 2009  . Obesity   . Paroxysmal atrial flutter ([place])   . Prediabetes   . Recurrent upper respiratory infection (URI)     Past Surgical History:  Procedure Laterality Date  . COLONOSCOPY  2018  . CORONARY ANGIOGRAM  2009     Home Medications:  Prior to Admission medications   Medication Sig Start Date End Date Taking? Authorizing Provider  albuterol (VENTOLIN HFA) 108 (90 Base) MCG/ACT inhaler Inhale 2 puffs into the lungs every 6 (six) hours as needed for wheezing or shortness of breath. 01/05/21  Yes Abonza, Maritza, PA-C  allopurinol (ZYLOPRIM) 300 MG tablet Take 1 tablet (300 mg total) by mouth daily.  08/11/20  Yes [name], PA-C  amLODipine (NORVASC) 5 MG tablet Take 1 tablet by mouth once daily 04/03/20  Yes Hochrein, Jeneen Rinks, MD  busPIRone (BUSPAR) 15 MG tablet Take 1/3 tablet p.o. twice daily for 1 week, then take 2/3 tablet p.o. twice daily for 1 week, then take 1 tablet p.o. twice daily 10/13/20  Yes Thayer Headings, PMHNP  Cholecalciferol (VITAMIN D3) 5000 units TABS 5,000 IU OTC vitamin D3 daily. 10/24/16  Yes Opalski, Neoma Laming, DO  dicyclomine (BENTYL) 20 MG tablet TAKE 1 TABLET BY MOUTH 4 TIMES DAILY BEFORE MEAL(S) AND AT BEDTIME AS NEEDED Patient taking differently: Take 20 mg by mouth 3 (three) times daily before meals. 07/31/20  Yes Gatha Mayer, MD  diphenhydrAMINE HCl, Sleep, 25 MG CAPS Take by mouth. Takes 1-2 tabs po QHS   Yes [provider]  doxycycline (VIBRA-TABS) 100 MG tablet Take 1 tablet (100 mg total) by mouth 2 (two) times daily. 12/18/20  Yes Boscia, Greer Ee, NP  fexofenadine (ALLEGRA) 180 MG tablet Take 180 mg by mouth daily.   Yes [provider]  fluticasone (FLONASE) 50 MCG/ACT nasal spray Place 2 sprays into the nose daily.   Yes [provider]  hydrochlorothiazide (MICROZIDE) 12.5 MG capsule Take 1 capsule (12.5 mg total) by mouth daily. 08/11/20  Yes [name], PA-C  levothyroxine (SYNTHROID) 50 MCG tablet Take 1 tablet (50 mcg total) by mouth daily. 12/12/20  Yes Abonza, Maritza, PA-C  losartan (COZAAR) 50 MG tablet Take 1 tablet (50 mg total) by mouth daily. 12/21/20  Yes Abonza, Maritza, PA-C  Multiple Vitamins-Minerals (SUPER MEGA VITE 75/BETA CARO PO) Take by mouth.   Yes [provider]  omega-3 acid ethyl esters (LOVAZA) 1 g capsule Take 2 capsules (2 g total) by mouth 2 (two) times daily. 06/28/19  Yes Opalski, Neoma Laming, DO  Potassium 99 MG TABS Take 3-4 tablets by mouth daily.   Yes [provider]  propranolol (INDERAL) 20 MG tablet Take 1 tablet by mouth 4 times daily Patient taking differently: Take  20 mg by mouth 4 (four) times daily as needed. Take 1 tablet by mouth 4 times daily prn 08/11/20  Yes Abonza, Maritza, PA-C  tamsulosin (FLOMAX) 0.4 MG CAPS capsule Take 1 capsule by mouth once daily 10/10/20  Yes Hollice Espy, MD  azithromycin (ZITHROMAX) 250 MG tablet Take 2 tablets by mouth on day 1. Then take 1 tablet by mouth daily x 4 days. Patient not taking: Reported on 01/06/2021 12/11/20   [name], PA-C  benzonatate (TESSALON) 200 MG capsule Take 1 capsule (200 mg total) by mouth 3 (three) times daily as needed for cough. Patient not taking: Reported on 01/06/2021 12/18/20   Ronnell Freshwater, NP  hydrocortisone (ANUSOL-HC) 2.5 % rectal cream Place 1 application rectally 2 (two) times daily. Patient not taking: Reported on 01/06/2021 04/09/19   Gatha Mayer, MD  predniSONE (STERAPRED UNI-PAK 21 TAB) 10 MG (21) TBPK tablet 6 day taper - take by mouth as directed for 6 days Patient not taking: Reported on 01/06/2021 12/18/20  Ronnell Freshwater, NP    Inpatient Medications: Scheduled Meds: . allopurinol  300 mg Oral Daily  . azithromycin  500 mg Oral Daily  . budesonide (PULMICORT) nebulizer solution  0.25 mg Nebulization BID  . busPIRone  15 mg Oral BID  . dicyclomine  20 mg Oral TID AC  . diltiazem  120 mg Oral Daily  . enoxaparin (LOVENOX) injection  55 mg Subcutaneous Q24H  . fluticasone  2 spray Each Nare Daily  . ipratropium-albuterol  3 mL Nebulization QID  . levothyroxine  50 mcg Oral Daily  . loratadine  10 mg Oral Daily  . mouth rinse  15 mL Mouth Rinse BID  . methylPREDNISolone (SOLU-MEDROL) injection  40 mg Intravenous Q12H  . nicotine  21 mg Transdermal Daily  . pantoprazole  40 mg Oral BID AC  . tamsulosin  0.4 mg Oral Daily   Continuous Infusions:  PRN Meds: acetaminophen **OR** acetaminophen, albuterol, chlorpheniramine-HYDROcodone, hydrALAZINE, polyethylene glycol  Allergies:   No Known Allergies  Social History:   Social History   Socioeconomic  History  . Marital status: Married    Spouse name: Not on file  . Number of children: 2  . Years of education: Not on file  . Highest education level: Not on file  Occupational History  . Occupation: truck Publishing rights manager  Tobacco Use  . Smoking status: Current Every Day Smoker    Packs/day: 0.50    Years: 37.00    Pack years: 18.50    Types: Cigarettes  . Smokeless tobacco: Never Used  Vaping Use  . Vaping Use: Former  Substance and Sexual Activity  . Alcohol use: Yes    Alcohol/week: 12.0 standard drinks    Types: 12 Cans of beer per week  . Drug use: No  . Sexual activity: Not on file  Other Topics Concern  . 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     Social Determinants of Health   Financial Resource Strain: Not on file  Food Insecurity: Not on file  Transportation Needs: Not on file  Physical Activity: Not on file  Stress: Not on file  Social Connections: Not on file  Intimate Partner Violence: Not on file    Family History:    Family History  Problem Relation Age of Onset  . Asthma Mother   . COPD Mother   . Cancer Mother        Skin  . Cancer - Other Mother        bone, liver, breast, lung  . Emphysema Mother   . Heart disease Father        Pacemaker  . Parkinson's disease Father   . Depression Father   . Depression Daughter   . OCD Daughter   . Alcohol abuse Maternal Grandmother   . Schizophrenia Maternal Grandmother   . Diabetes Paternal Grandfather   . Kidney disease Paternal Grandfather   . Colon polyps Neg Hx   . Colon cancer Neg Hx   . Esophageal cancer Neg Hx   . Stomach cancer Neg Hx   . Rectal cancer Neg Hx      ROS:  Please see the history of present illness.  All other ROS reviewed and negative.     Physical Exam/Data:   Vitals:   01/08/21 1930 01/09/21 0541 01/09/21 0714 01/09/21 0830  BP:  (!) 160/84  (!) 151/89  Pulse:  70  91  Resp:  20  18  Temp:  (!) 97.5 F (36.4 C)     TempSrc:  Oral    SpO2: 93% 94% 93% 94%  Weight:      Height:        Intake/Output Summary (Last 24 hours) at 01/09/2021 0936 Last data filed at 01/08/2021 1450 Gross per 24 hour  Intake --  Output 1300 ml  Net -1300 ml   Last 3 Weights 01/06/2021 12/18/2020 12/06/2020  Weight (lbs) 242 lb 247 lb 14.4 oz 242 lb  Weight (kg) 109.77 kg 112.447 kg 109.77 kg  Some encounter information is confidential and restricted. Go to Review Flowsheets activity to see all data.     Body mass index is 37.9 kg/m.  General: Well developed, well nourished WM, in no acute distress. Head: Normocephalic, atraumatic, sclera non-icteric, no xanthomas, nares are without discharge. Neck: Negative for carotid bruits. JVP not elevated. Lungs: Diffuse rhonchi, mild wheezing. No crackles Breathing is unlabored on O2. Heart: RRR S1 S2 without murmurs, rubs, or gallops.  Abdomen: Soft, non-tender, non-distended with normoactive bowel sounds. No rebound/guarding. Extremities: No clubbing or cyanosis. No edema. Distal pedal pulses are 2+ and equal bilaterally. Neuro: Alert and oriented X 3. Moves all extremities spontaneously. Psych:  Responds to questions appropriately with a normal, talkative affect.  EKG:  The EKG was personally reviewed and demonstrates: atrial flutter 130bpm, NSIVCD, LAFB, LVH, nonspecific TW changes. Also reviewed admit tracing showing NSR, NSVICD, TWI avL Telemetry:  Telemetry was personally reviewed and demonstrates: NSR (atrial flutter yesterday from [id number]  Relevant CV Studies: 2d echo 01/07/21 1. Left ventricular ejection fraction, by estimation, is 60 to 65%. Left  ventricular ejection fraction by 3D volume is 67 %. The left ventricle has  normal function. The left ventricle has no regional wall motion  abnormalities. There is mild-to-moderate  concentric left ventricular hypertrophy. Left ventricular diastolic  parameters are consistent with Grade II diastolic dysfunction   (pseudonormalization).  2. Right ventricular systolic function is normal. The right ventricular  size is normal.  3. The mitral valve is normal in structure. Trivial mitral valve  regurgitation. No evidence of mitral stenosis.  4. The aortic valve is tricuspid. Aortic valve regurgitation is not  visualized. No aortic stenosis is present.  5. Aortic dilatation noted. There is mild dilatation of the ascending  aorta, measuring 41 mm.  6. The inferior vena cava is dilated in size with <50% respiratory  variability, suggesting right atrial pressure of 15 mmHg.   Comparison(s): No prior Echocardiogram.   Laboratory Data:  High Sensitivity Troponin:   Recent Labs  Lab 01/06/21 0932 01/06/21 1108  TROPONINIHS 6 6     Chemistry Recent Labs  Lab 01/07/21 0519 01/08/21 0530 01/09/21 0354  NA 137 138 137  K 4.7 5.0 4.5  CL 93* 92* 89*  CO2 36* 39* 41*  GLUCOSE 174* 134* 129*  BUN 15 19 21*  CREATININE 0.69 0.66 0.76  CALCIUM 8.9 9.1 9.2  GFRNONAA >60 >60 >60  ANIONGAP 8 7 7     Recent Labs  Lab 01/06/21 0932  PROT 6.7  ALBUMIN 3.8  AST 22  ALT 23  ALKPHOS 55  BILITOT 0.9   Hematology Recent Labs  Lab 01/07/21 0519 01/08/21 0530 01/09/21 0354  WBC 6.2 10.4 10.2  RBC 4.32 4.06* 4.20*  HGB 14.0 13.2 13.5  HCT 44.4 42.0 42.4  MCV 102.8* 103.4* 101.0*  MCH 32.4 32.5 32.1  MCHC 31.5 31.4 31.8  RDW 12.1 12.3 12.5  PLT 147* 145* 152  BNP Recent Labs  Lab 01/06/21 0932  BNP 72.3    DDimer No results for input(s): DDIMER in the last 168 hours.   Radiology/Studies:  CT Angio Chest PE W and/or Wo Contrast  Result Date: 01/06/2021 CLINICAL DATA:  Shortness of breath EXAM: CT ANGIOGRAPHY CHEST WITH CONTRAST TECHNIQUE: Multidetector CT imaging of the chest was performed using the standard protocol during bolus administration of intravenous contrast. Multiplanar CT image reconstructions and MIPs were obtained to evaluate the vascular anatomy. CONTRAST:  63mL  OMNIPAQUE IOHEXOL 350 MG/ML SOLN COMPARISON:  January 06, 2021 FINDINGS: Cardiovascular: There is no demonstrable pulmonary embolus. There is no evident thoracic aortic aneurysm or dissection. Visualized great vessels appear unremarkable. There are foci coronary artery calcification. There is no pericardial effusion or pericardial thickening. Mediastinum/Nodes: Thyroid appears unremarkable. There is a lymph node in the aortopulmonary window region measuring 1.4 x 1.3 cm. There is a lymph node to the right of the distal trachea measuring 1.3 x 1.1 cm. There is a subcarinal lymph node measuring 1.5 x 1.4 cm. A lymph node in the right hilum measures 1.0 x 1.0 cm. Several subcentimeter lymph nodes also noted in the mediastinum. No esophageal lesions are appreciable. Lungs/Pleura: There are areas of patchy airspace opacity in the left lower lobe and inferior lingula. On axial slice 72 series 5, there is an 8 x 7 mm nodular opacity in the posterior segment of the left upper lobe near the major fissure. On axial slice 72 series 5, there is a 5 x 4 mm nodular opacity in the anterior segment of the right upper lobe. There is atelectatic change in the right lung base. Trachea and major bronchial structures appear normal. No pleural effusion. No pneumothorax. Upper Abdomen: Visualized upper abdominal structures appear unremarkable. Musculoskeletal: No blastic or lytic bone lesions evident. No chest wall lesions. Review of the MIP images confirms the above findings. IMPRESSION: 1. No demonstrable pulmonary embolus. No thoracic aortic aneurysm or dissection. There are foci of coronary artery calcification. 2. Areas of airspace opacity in the left lower lobe and inferior lingula consistent with pneumonia. Right base atelectasis. 3. 8 x 7 mm nodular opacity in the posterior segment left upper lobe near the major fissure. There is a 5 x 4 mm nodular opacity in the anterior segment of the right upper lobe. Non-contrast chest CT at 6-12  months is recommended. If the nodule is stable at time of repeat CT, then future CT at 18-24 months (from today's scan) is considered optional for low-risk patients, but is recommended for high-risk patients. This recommendation follows the consensus statement: Guidelines for Management of Incidental Pulmonary Nodules Detected on CT Images: From the Fleischner Society 2017; Radiology 2017; 284:228-243. 4. Several mildly enlarged lymph nodes of uncertain etiology. Lymph node prominence of this nature potentially could have reactive etiology given the areas of pneumonia. Neoplastic etiology cannot be excluded in this circumstance, however. Electronically Signed   By: Lowella Grip III M.D.   On: 01/06/2021 11:59   DG Chest Portable 1 View  Result Date: 01/06/2021 CLINICAL DATA:  55 year old male with history of shortness of breath, cough and chest pain. EXAM: PORTABLE CHEST 1 VIEW COMPARISON:  Chest x-ray 12/06/2020. FINDINGS: Lung volumes are normal. Ill-defined opacities are noted throughout the left lung base. No pleural effusions. No pneumothorax. No pulmonary nodule or mass noted. Pulmonary vasculature and the cardiomediastinal silhouette are within normal limits. IMPRESSION: 1. Ill-defined opacities at the left base concerning for potential bronchopneumonia. Followup PA and lateral  chest X-ray is recommended in 3-4 weeks following trial of antibiotic therapy to ensure resolution and exclude underlying malignancy. Electronically Signed   By: Vinnie Langton M.D.   On: 01/06/2021 10:48   ECHOCARDIOGRAM COMPLETE  Result Date: 01/07/2021    ECHOCARDIOGRAM REPORT   Patient Name:   Ralph Mack Date of Exam: 01/07/2021 Medical Rec #:  [id number]     Height:       67.0 in Accession #:    [id number]    Weight:       242.0 lb Date of Birth:  [birth year]     BSA:          2.193 m Patient Age:    63 years      BP:           158/82 mmHg Patient Gender: M             HR:           73 bpm. Exam Location:  Inpatient  Procedure: 2D Echo, 3D Echo, Cardiac Doppler and Color Doppler Indications:    R06.02 SOB  History:        Patient has no prior history of Echocardiogram examinations.                 Signs/Symptoms:Chest Pain, Shortness of Breath and Dyspnea; Risk                 Factors:Current Smoker, Hypertension and Dyslipidemia. Substance                 abuse. ETOH.  Sonographer:    Roseanna Rainbow RDCS Referring Phys: [id number] [place]  Sonographer Comments: Patient is morbidly obese and Technically difficult study due to poor echo windows. Image acquisition challenging due to patient body habitus. IMPRESSIONS  1. Left ventricular ejection fraction, by estimation, is 60 to 65%. Left ventricular ejection fraction by 3D volume is 67 %. The left ventricle has normal function. The left ventricle has no regional wall motion abnormalities. There is mild-to-moderate concentric left ventricular hypertrophy. Left ventricular diastolic parameters are consistent with Grade II diastolic dysfunction (pseudonormalization).  2. Right ventricular systolic function is normal. The right ventricular size is normal.  3. The mitral valve is normal in structure. Trivial mitral valve regurgitation. No evidence of mitral stenosis.  4. The aortic valve is tricuspid. Aortic valve regurgitation is not visualized. No aortic stenosis is present.  5. Aortic dilatation noted. There is mild dilatation of the ascending aorta, measuring 41 mm.  6. The inferior vena cava is dilated in size with <50% respiratory variability, suggesting right atrial pressure of 15 mmHg. Comparison(s): No prior Echocardiogram. FINDINGS  Left Ventricle: Left ventricular ejection fraction, by estimation, is 60 to 65%. Left ventricular ejection fraction by 3D volume is 67 %. The left ventricle has normal function. The left ventricle has no regional wall motion abnormalities. The left ventricular internal cavity size was normal in size. There is mild-to-moderate concentric left  ventricular hypertrophy. Left ventricular diastolic parameters are consistent with Grade II diastolic dysfunction (pseudonormalization). Right Ventricle: The right ventricular size is normal. No increase in right ventricular wall thickness. Right ventricular systolic function is normal. Left Atrium: Left atrial size was normal in size. Right Atrium: Right atrial size was normal in size. Pericardium: There is no evidence of pericardial effusion. Mitral Valve: The mitral valve is normal in structure. There is mild thickening of the mitral valve leaflet(s). There is mild calcification of the mitral valve leaflet(s). Mild mitral  annular calcification. Trivial mitral valve regurgitation. No evidence  of mitral valve stenosis. Tricuspid Valve: The tricuspid valve is normal in structure. Tricuspid valve regurgitation is trivial. Aortic Valve: The aortic valve is tricuspid. Aortic valve regurgitation is not visualized. No aortic stenosis is present. Pulmonic Valve: The pulmonic valve was not well visualized. Pulmonic valve regurgitation is trivial. Aorta: Aortic dilatation noted. There is mild dilatation of the ascending aorta, measuring 41 mm. Venous: The inferior vena cava is dilated in size with less than 50% respiratory variability, suggesting right atrial pressure of 15 mmHg. IAS/Shunts: No atrial level shunt detected by color flow Doppler.  LEFT VENTRICLE PLAX 2D LVIDd:         5.70 cm         Diastology LVIDs:         3.60 cm         LV e' medial:    7.07 cm/s LV PW:         1.20 cm         LV E/e' medial:  19.1 LV IVS:        1.60 cm         LV e' lateral:   7.62 cm/s LVOT diam:     2.20 cm         LV E/e' lateral: 17.7 LV SV:         139 LV SV Index:   63 LVOT Area:     3.80 cm        3D Volume EF                                LV 3D EF:    Left                                             ventricular LV Volumes (MOD)                            ejection LV vol d, MOD    147.0 ml                   fraction by A2C:                                         3D volume LV vol d, MOD    115.0 ml                   is 67 %. A4C: LV vol s, MOD    44.7 ml A2C:                           3D Volume EF: LV vol s, MOD    42.0 ml       3D EF:        67 % A4C: LV SV MOD A2C:   102.3 ml LV SV MOD A4C:   115.0 ml LV SV MOD BP:    86.3 ml RIGHT VENTRICLE             IVC RV S prime:     17.00 cm/s  IVC diam: 2.70  cm TAPSE (M-mode): 3.0 cm LEFT ATRIUM             Index       RIGHT ATRIUM           Index LA diam:        3.70 cm 1.69 cm/m  RA Area:     17.60 cm LA Vol (A2C):   63.9 ml 29.14 ml/m RA Volume:   47.40 ml  21.62 ml/m LA Vol (A4C):   56.0 ml 25.54 ml/m LA Biplane Vol: 65.2 ml 29.73 ml/m  AORTIC VALVE LVOT Vmax:   167.00 cm/s LVOT Vmean:  107.000 cm/s LVOT VTI:    0.365 m  AORTA Ao Root diam: 3.90 cm Ao Asc diam:  4.10 cm MITRAL VALVE MV Area (PHT): 3.99 cm     SHUNTS MV Decel Time: 190 msec     Systemic VTI:  0.36 m MV E velocity: 135.00 cm/s  Systemic Diam: 2.20 cm MV A velocity: 116.00 cm/s MV E/A ratio:  1.16 Gwyndolyn Kaufman MD Electronically signed by Gwyndolyn Kaufman MD Signature Date/Time: 01/07/2021/1:08:58 PM    Final      Assessment and Plan:   1. Acute hypoxic respiratory failure felt secondary to PNA and undiagnosed COPD exacerbation - clinically improving with pulm rx, also received 2 doses of IV Lasix as well - O2 management per primary team - patient expresses motivation to quit smoking  2. Paroxysmal atrial flutter - noted on monitor previously as OP, with brief recurrence here - CHADSVASC is 2 for HTN, coronary artery calcification - will discuss anticoag with MD - TSH wnl - will consolidate short-acting diltiazem to extended release 120mg  daily - was on PRN propranolol as OP, would discontinue and consider short-acting diltiazem if needed in the future for breakthrough palpitations given COPD  3. Essential HTN - amlodipine changed to diltiazem this admission - was on losartan and HCTZ as OP -  notes mention resumption but not yet ordered - will defer rx to primary team  4. Mild dilation of ascending aorta - f/u as OP typically with a repeat echo in a year  5. 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 Pulm nodules, lymphadenopathy - per medical team  7. Habitual tobacco/ETOH use - ultimate goal of cessation would be helpful  Risk Assessment/Risk Scores:        CHA2DS2-VASc Score = 2  This indicates a 2.2% annual risk of stroke. The patient's score is based upon: CHF History: No HTN History: Yes Diabetes History: No Stroke History: No Vascular Disease History: Yes Age Score: 0 Gender Score: 0         For questions or updates, please contact [place] Please consult [email or web address] for contact info under    Signed, Charlie Pitter, PA-C  01/09/2021 9:36 AM  Personally seen and examined. Agree with above.   55 year old with paroxysmal atrial flutter.  Converted to normal sinus rhythm.  No chest pain, shortness of breath is improving.  Lungs with diffuse rhonchi Heart regular rate and rhythm No edema Alert and oriented x3  Prior EKG showed atrial flutter 130.  Nonspecific ST-T wave changes left anterior fascicular block.  Telemetry personally reviewed shows normal sinus rhythm currently.  Echocardiogram 14/10/22 showed EF of 65% mitral valve was normal aortic root 41 mm.  Creatinine 0.7, hemoglobin 13.5  Assessment and plan:  Paroxysmal atrial flutter -This has  been seen previously on outpatient monitoring. -Coronary calcium noted as well as hypertension which would put him at a CHA2DS2-VASc score of 2. 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  What we can do is set him up as an outpatient for discussion  with Dr. Quentin Ore in EP for [place] device.  We discussed. -Cardizem  Mildly dilated ascending aorta -Repeat echo in 1 year.  Minimally dilated.  Alcohol use -Contributing to atrial flutter. -Encourage cessation  Acute hypoxic respiratory failure secondary to pneumonia and COPD exacerbation -Received both Lasix as well as pulmonary treatments. -Tobacco cessation encouraged.  He says he would be committed.  Urologist recommended sleep study secondary to four times night nocturia.  Agree, secondary to atrial flutter.   Candee Furbish, MD

## 2021-01-09 NOTE — Progress Notes (Addendum)
PROGRESS NOTE  Ralph Mack CHE:[id number] DOB: 1966-09-22 DOA: 01/06/2021 PCP: [name], PA-C   LOS: 3 days   Brief narrative: Patient is a 55 years old male with history of hypertension, hyperlipidemia, morbid obesity, chronic smoking presented to the hospital with increasing shortness of breath for few weeks.  Patient had seen PCP on 12/18/2020 and was given Z-Pak and doxycycline with prednisone taper.  He had some improvement but his symptoms did not completely abate.  He then decided to come to the hospital.  In the ED, the patient was 70% on room air and placed on 15 L/min NRB.  He admitted to orthopnea and intermittent leg swelling, productive cough without hemoptysis, dyspnea on exertion, easily fatigued.  In the ED patient was tachypneic, hypotensive, hypoxic.  He had mild hyponatremia with sodium of 133.  No leukocytosis.  COVID-19 and flu was negative.  Troponins were negative.  CTA of the chest was negative for PE but some infiltrate.  There was some pulmonary nodules noted as well.  Several mildly enlarged lymph nodes.  There as well.  Patient received Solu-Medrol, albuterol and then was considered for admission to hospital for acute hypoxic respiratory failure.  Assessment/Plan:  Principal Problem:   Acute respiratory failure with hypoxia (HCC) Active Problems:   Smoker   Hypothyroidism   Dyslipidemia   Essential hypertension   Morbid (severe) obesity due to excess calories ([place])  Acute hypoxic respiratory failure suspect secondary to  COPD exacerbation Patient was hypoxic to 67% on room air at presentation, currently on  3 L/min.  CTA of the chest was done which did not show any evidence of pulmonary embolism but possible infiltrate over the left lower lobe and inferior lingula consistent with pneumonia as well as right basilar atelectasis.  Patient has recently completed azithromycin, doxycycline as outpatient, is afebrile and leukocytosis is absent.  Procalcitonin is  negative.  We will continue to hold off with antibiotic.  Patient might need assessment for obstructive sleep apnea assessment as outpatient..  Continue with nebulizer, IV steroids, incentive spirometry and flutter valve.  Added budesonide inhaler yesterday..  2D echocardiogram showed LV ejection fraction of 60 to 65% with grade 2 diastolic dysfunction.  Received IV Lasix twice during hospitalization.  Continue Claritin and Protonix for possible reflux.  Continue Flonase nasal spray.  BNP was 72.3 but patient is obese.  On empiric Zithromax as well.  Would likely benefit from pulmonary follow-up as outpatient for PFTs pulmonary nodule follow-up.  Pneumonia was ruled out.   Essential hypertension Was on losartan, HCTZ amlodipine. Continue needed hydralazine for now.  Will resume losartan, HCTZ today.  Discontinue amlodipine.  Patient was on propranolol at home which will be discontinued.  Has been started on Cardizem.   Hyperlipidemia Not on medications.  Will check lipid panel.   Morbid obesity Body mass index is 37.9 kg/m.  Would benefit from weight loss as outpatient.  Would benefit from sleep apnea evaluation as outpatient as well   Hypothyroidism Continue levothyroxine   LUL and RUL pulmonary nodules As per CT scan recommendation.  Plan is repeat CT in 6 to 12 months.  Might benefit from pulmonary evaluation as outpatient.   Tobacco use States that he failed nicotine patch nicotine gums in the past.  Patient wishes to quit at this time.  Has been started on nicotine patch.  Paroxysmal atrial flutter.  Currently normal sinus rhythm.  Received 1 dose of metoprolol yesterday.  Had been started on short acting Cardizem yesterday.  Patient has been changed to long-acting Cardizem today.  Patient has had a CHA2DS2-VASc score of 2, cardiology to decide on possibility of anticoagulation.  DVT prophylaxis: Lovenox subcu  Code Status: Full code  Family Communication: None  Status is:  Inpatient  Remains inpatient appropriate because:IV treatments appropriate due to intensity of illness or inability to take PO and Inpatient level of care appropriate due to severity of illness, hypoxic respiratory failure on supplemental oxygen, paroxysmal atrial flutter,   Dispo: The patient is from: Home              Anticipated d/c is to: Home likely in 1 to 2 days.  We will continue to wean oxygen as able.  Might need oxygen supplementation if unable to wean.              Patient currently is not medically stable to d/c.   Difficult to place patient No   Consultants: Cardiology  Procedures: None  Anti-infectives:  Azithromycin 4/11>  Anti-infectives (From admission, onward)    Start     Dose/Rate Route Frequency Ordered Stop   01/08/21 1200  azithromycin (ZITHROMAX) tablet 500 mg        500 mg Oral Daily 01/08/21 1020 01/11/21 0959      Subjective: Today, patient was seen and examined at bedside.  Continues to feel better with breathing.  Denies any chest pain, dizziness, lightheadedness.  Feels stuffy in his nose.    Objective: Vitals:   01/09/21 0714 01/09/21 0830  BP:  (!) 151/89  Pulse:  91  Resp:  18  Temp:    SpO2: 93% 94%    Intake/Output Summary (Last 24 hours) at 01/09/2021 1021 Last data filed at 01/08/2021 1450 Gross per 24 hour  Intake --  Output 1300 ml  Net -1300 ml   Filed Weights   01/06/21 0934  Weight: 109.8 kg   Body mass index is 37.9 kg/m.   Physical Exam:  General: Obese built, not in obvious distress, on 3 L of oxygen by nasal cannula HENT:   No scleral pallor or icterus noted. Oral mucosa is moist.  Chest:    Diminished breath sounds bilaterally.  Mild wheezes noted. CVS: S1 &S2 heard. No murmur.  Regular rate and rhythm. Abdomen: Soft, nontender, nondistended.  Bowel sounds are heard.   Extremities: No cyanosis, clubbing or edema.  Peripheral pulses are palpable. Psych: Alert, awake and oriented, normal mood CNS:  No cranial  nerve deficits.  Power equal in all extremities.   Skin: Warm and dry.  No rashes noted.  Data Review: I have personally reviewed the following laboratory data and studies,  CBC: Recent Labs  Lab 01/06/21 0932 01/07/21 0519 01/08/21 0530 01/09/21 0354  WBC 5.7 6.2 10.4 10.2  NEUTROABS 3.5  --   --   --   HGB 14.2 14.0 13.2 13.5  HCT 43.0 44.4 42.0 42.4  MCV 98.6 102.8* 103.4* 101.0*  PLT 143* 147* 145* 096   Basic Metabolic Panel: Recent Labs  Lab 01/06/21 0932 01/07/21 0519 01/08/21 0530 01/09/21 0354  NA 133* 137 138 137  K 4.5 4.7 5.0 4.5  CL 91* 93* 92* 89*  CO2 34* 36* 39* 41*  GLUCOSE 106* 174* 134* 129*  BUN 14 15 19  21*  CREATININE 0.58* 0.69 0.66 0.76  CALCIUM 8.5* 8.9 9.1 9.2  MG  --   --  2.1 2.2  PHOS  --   --  3.8 3.8   Liver Function Tests: Recent  Labs  Lab 01/06/21 0932  AST 22  ALT 23  ALKPHOS 55  BILITOT 0.9  PROT 6.7  ALBUMIN 3.8   No results for input(s): LIPASE, AMYLASE in the last 168 hours. No results for input(s): AMMONIA in the last 168 hours. Cardiac Enzymes: No results for input(s): CKTOTAL, CKMB, CKMBINDEX, TROPONINI in the last 168 hours. BNP (last 3 results) Recent Labs    01/06/21 0932  BNP 72.3    ProBNP (last 3 results) No results for input(s): PROBNP in the last 8760 hours.  CBG: No results for input(s): GLUCAP in the last 168 hours. Recent Results (from the past 240 hour(s))  Resp Panel by RT-PCR (Flu A&B, Covid) Nasopharyngeal Swab     Status: None   Collection Time: 01/06/21  9:25 AM   Specimen: Nasopharyngeal Swab; Nasopharyngeal(NP) swabs in vial transport medium  Result Value Ref Range Status   SARS Coronavirus 2 by RT PCR NEGATIVE NEGATIVE Final    Comment: (NOTE) SARS-CoV-2 target nucleic acids are NOT DETECTED.  The SARS-CoV-2 RNA is generally detectable in upper respiratory specimens during the acute phase of infection. The lowest concentration of SARS-CoV-2 viral copies this assay can detect is 138  copies/mL. A negative result does not preclude SARS-Cov-2 infection and should not be used as the sole basis for treatment or other patient management decisions. A negative result may occur with  improper specimen collection/handling, submission of specimen other than nasopharyngeal swab, presence of viral mutation(s) within the areas targeted by this assay, and inadequate number of viral copies(<138 copies/mL). A negative result must be combined with clinical observations, patient history, and epidemiological information. The expected result is Negative.  Fact Sheet for Patients:  EntrepreneurPulse.com.au  Fact Sheet for Healthcare Providers:  IncredibleEmployment.be  This test is no t yet approved or cleared by the [place] FDA and  has been authorized for detection and/or diagnosis of SARS-CoV-2 by FDA under an Emergency Use Authorization (EUA). This EUA will remain  in effect (meaning this test can be used) for the duration of the COVID-19 declaration under Section 564(b)(1) of the Act, 21 U.S.C.section 360bbb-3(b)(1), unless the authorization is terminated  or revoked sooner.       Influenza A by PCR NEGATIVE NEGATIVE Final   Influenza B by PCR NEGATIVE NEGATIVE Final    Comment: (NOTE) The Xpert Xpress SARS-CoV-2/FLU/RSV plus assay is intended as an aid in the diagnosis of influenza from Nasopharyngeal swab specimens and should not be used as a sole basis for treatment. Nasal washings and aspirates are unacceptable for Xpert Xpress SARS-CoV-2/FLU/RSV testing.  Fact Sheet for Patients: EntrepreneurPulse.com.au  Fact Sheet for Healthcare Providers: IncredibleEmployment.be  This test is not yet approved or cleared by the [place] FDA and has been authorized for detection and/or diagnosis of SARS-CoV-2 by FDA under an Emergency Use Authorization (EUA). This EUA will remain in effect (meaning  this test can be used) for the duration of the COVID-19 declaration under Section 564(b)(1) of the Act, 21 U.S.C. section 360bbb-3(b)(1), unless the authorization is terminated or revoked.  Performed at [facility], [place] [address], [place], [place] [address]   MRSA PCR Screening     Status: None   Collection Time: 01/06/21  4:31 PM   Specimen: Nasopharyngeal  Result Value Ref Range Status   MRSA by PCR NEGATIVE NEGATIVE Final    Comment:        The GeneXpert MRSA Assay (FDA approved for NASAL specimens only), is one component of a  comprehensive MRSA colonization surveillance program. It is not intended to diagnose MRSA infection nor to guide or monitor treatment for MRSA infections. Performed at [facility], [place] [address] [place],  [address]      Studies: ECHOCARDIOGRAM COMPLETE  Result Date: 01/07/2021    ECHOCARDIOGRAM REPORT   Patient Name:   Josealberto Hickle Date of Exam: 01/07/2021 Medical Rec #:  [id number]     Height:       67.0 in Accession #:    [id number]    Weight:       242.0 lb Date of Birth:  12/21/1965     BSA:          2.193 m Patient Age:    89 years      BP:           158/82 mmHg Patient Gender: M             HR:           73 bpm. Exam Location:  Inpatient Procedure: 2D Echo, 3D Echo, Cardiac Doppler and Color Doppler Indications:    R06.02 SOB  History:        Patient has no prior history of Echocardiogram examinations.                 Signs/Symptoms:Chest Pain, Shortness of Breath and Dyspnea; Risk                 Factors:Current Smoker, Hypertension and Dyslipidemia. Substance                 abuse. ETOH.  Sonographer:    Roseanna Rainbow RDCS Referring Phys: [id number] [place]  Sonographer Comments: Patient is morbidly obese and Technically difficult study due to poor echo windows. Image acquisition challenging due to patient body habitus. IMPRESSIONS  1. Left ventricular ejection fraction, by estimation, is 60 to 65%. Left  ventricular ejection fraction by 3D volume is 67 %. The left ventricle has normal function. The left ventricle has no regional wall motion abnormalities. There is mild-to-moderate concentric left ventricular hypertrophy. Left ventricular diastolic parameters are consistent with Grade II diastolic dysfunction (pseudonormalization).  2. Right ventricular systolic function is normal. The right ventricular size is normal.  3. The mitral valve is normal in structure. Trivial mitral valve regurgitation. No evidence of mitral stenosis.  4. The aortic valve is tricuspid. Aortic valve regurgitation is not visualized. No aortic stenosis is present.  5. Aortic dilatation noted. There is mild dilatation of the ascending aorta, measuring 41 mm.  6. The inferior vena cava is dilated in size with <50% respiratory variability, suggesting right atrial pressure of 15 mmHg. Comparison(s): No prior Echocardiogram. FINDINGS  Left Ventricle: Left ventricular ejection fraction, by estimation, is 60 to 65%. Left ventricular ejection fraction by 3D volume is 67 %. The left ventricle has normal function. The left ventricle has no regional wall motion abnormalities. The left ventricular internal cavity size was normal in size. There is mild-to-moderate concentric left ventricular hypertrophy. Left ventricular diastolic parameters are consistent with Grade II diastolic dysfunction (pseudonormalization). Right Ventricle: The right ventricular size is normal. No increase in right ventricular wall thickness. Right ventricular systolic function is normal. Left Atrium: Left atrial size was normal in size. Right Atrium: Right atrial size was normal in size. Pericardium: There is no evidence of pericardial effusion. Mitral Valve: The mitral valve is normal in structure. There is mild thickening of the mitral valve leaflet(s). There is mild calcification of the  mitral valve leaflet(s). Mild mitral annular calcification. Trivial mitral valve  regurgitation. No evidence  of mitral valve stenosis. Tricuspid Valve: The tricuspid valve is normal in structure. Tricuspid valve regurgitation is trivial. Aortic Valve: The aortic valve is tricuspid. Aortic valve regurgitation is not visualized. No aortic stenosis is present. Pulmonic Valve: The pulmonic valve was not well visualized. Pulmonic valve regurgitation is trivial. Aorta: Aortic dilatation noted. There is mild dilatation of the ascending aorta, measuring 41 mm. Venous: The inferior vena cava is dilated in size with less than 50% respiratory variability, suggesting right atrial pressure of 15 mmHg. IAS/Shunts: No atrial level shunt detected by color flow Doppler.  LEFT VENTRICLE PLAX 2D LVIDd:         5.70 cm         Diastology LVIDs:         3.60 cm         LV e' medial:    7.07 cm/s LV PW:         1.20 cm         LV E/e' medial:  19.1 LV IVS:        1.60 cm         LV e' lateral:   7.62 cm/s LVOT diam:     2.20 cm         LV E/e' lateral: 17.7 LV SV:         139 LV SV Index:   63 LVOT Area:     3.80 cm        3D Volume EF                                LV 3D EF:    Left                                             ventricular LV Volumes (MOD)                            ejection LV vol d, MOD    147.0 ml                   fraction by A2C:                                        3D volume LV vol d, MOD    115.0 ml                   is 67 %. A4C: LV vol s, MOD    44.7 ml A2C:                           3D Volume EF: LV vol s, MOD    42.0 ml       3D EF:        67 % A4C: LV SV MOD A2C:   102.3 ml LV SV MOD A4C:   115.0 ml LV SV MOD BP:    86.3 ml RIGHT VENTRICLE             IVC RV S prime:     17.00  cm/s  IVC diam: 2.70 cm TAPSE (M-mode): 3.0 cm LEFT ATRIUM             Index       RIGHT ATRIUM           Index LA diam:        3.70 cm 1.69 cm/m  RA Area:     17.60 cm LA Vol (A2C):   63.9 ml 29.14 ml/m RA Volume:   47.40 ml  21.62 ml/m LA Vol (A4C):   56.0 ml 25.54 ml/m LA Biplane Vol: 65.2 ml 29.73 ml/m   AORTIC VALVE LVOT Vmax:   167.00 cm/s LVOT Vmean:  107.000 cm/s LVOT VTI:    0.365 m  AORTA Ao Root diam: 3.90 cm Ao Asc diam:  4.10 cm MITRAL VALVE MV Area (PHT): 3.99 cm     SHUNTS MV Decel Time: 190 msec     Systemic VTI:  0.36 m MV E velocity: 135.00 cm/s  Systemic Diam: 2.20 cm MV A velocity: 116.00 cm/s MV E/A ratio:  1.16 Gwyndolyn Kaufman MD Electronically signed by Gwyndolyn Kaufman MD Signature Date/Time: 01/07/2021/1:08:58 PM    Final      Flora Lipps, MD  [facility] 01/09/2021  If 7PM-7AM, please contact night-coverage

## 2021-01-10 DIAGNOSIS — J441 Chronic obstructive pulmonary disease with (acute) exacerbation: Principal | ICD-10-CM

## 2021-01-10 LAB — CBC
HCT: 45.6 % (ref 39.0–52.0)
Hemoglobin: 14.6 g/dL (ref 13.0–17.0)
MCH: 31.9 pg (ref 26.0–34.0)
MCHC: 32 g/dL (ref 30.0–36.0)
MCV: 99.8 fL (ref 80.0–100.0)
Platelets: 155 10*3/uL (ref 150–400)
RBC: 4.57 MIL/uL (ref 4.22–5.81)
RDW: 12.5 % (ref 11.5–15.5)
WBC: 8.4 10*3/uL (ref 4.0–10.5)
nRBC: 0 % (ref 0.0–0.2)

## 2021-01-10 LAB — LIPID PANEL
Cholesterol: 191 mg/dL (ref 0–200)
HDL: 66 mg/dL (ref >40)
LDL Cholesterol: 104 mg/dL — ABNORMAL HIGH (ref 0–99)
Total CHOL/HDL Ratio: 2.9 RATIO
Triglycerides: 105 mg/dL (ref <150)
VLDL: 21 mg/dL (ref 0–40)

## 2021-01-10 LAB — HEMOGLOBIN A1C
Hgb A1c MFr Bld: 5.6 % (ref 4.8–5.6)
Mean Plasma Glucose: 114.02 mg/dL

## 2021-01-10 LAB — BASIC METABOLIC PANEL
Anion gap: 10 (ref 5–15)
BUN: 21 mg/dL — ABNORMAL HIGH (ref 6–20)
CO2: 38 mmol/L — ABNORMAL HIGH (ref 22–32)
Calcium: 9.2 mg/dL (ref 8.9–10.3)
Chloride: 90 mmol/L — ABNORMAL LOW (ref 98–111)
Creatinine, Ser: 0.8 mg/dL (ref 0.61–1.24)
GFR, Estimated: >60 mL/min (ref >60)
Glucose, Bld: 130 mg/dL — ABNORMAL HIGH (ref 70–99)
Potassium: 4.6 mmol/L (ref 3.5–5.1)
Sodium: 138 mmol/L (ref 135–145)

## 2021-01-10 LAB — PHOSPHORUS: Phosphorus: 4.8 mg/dL — ABNORMAL HIGH (ref 2.5–4.6)

## 2021-01-10 LAB — MAGNESIUM: Magnesium: 2.4 mg/dL (ref 1.7–2.4)

## 2021-01-10 MED ORDER — ALBUTEROL SULFATE HFA 108 (90 BASE) MCG/ACT IN AERS: 2.0000 | INHALATION_SPRAY | RESPIRATORY_TRACT | 0 refills | Status: DC | PRN

## 2021-01-10 MED ORDER — PANTOPRAZOLE SODIUM 40 MG PO TBEC: DELAYED_RELEASE_TABLET | ORAL | 0 refills | Status: DC

## 2021-01-10 MED ORDER — APIXABAN 5 MG PO TABS: 5.0000 mg | ORAL_TABLET | Freq: Two times a day (BID) | ORAL | 2 refills | Status: DC

## 2021-01-10 MED ORDER — DILTIAZEM HCL ER COATED BEADS 120 MG PO CP24: 120.0000 mg | ORAL_CAPSULE | Freq: Every day | ORAL | 2 refills | Status: DC

## 2021-01-10 MED ORDER — APIXABAN 5 MG PO TABS
5.0000 mg | ORAL_TABLET | Freq: Two times a day (BID) | ORAL | Status: DC
  Administered 2021-01-10: 5 mg via ORAL
  Filled 2021-01-10: qty 1

## 2021-01-10 MED ORDER — NICOTINE 21 MG/24HR TD PT24: 21.0000 mg | MEDICATED_PATCH | Freq: Every day | TRANSDERMAL | 0 refills | Status: DC

## 2021-01-10 MED ORDER — PREDNISONE 10 MG (21) PO TBPK: ORAL_TABLET | ORAL | 0 refills | Status: DC

## 2021-01-10 MED ORDER — FLUTICASONE FUROATE-VILANTEROL 200-25 MCG/INH IN AEPB: 1.0000 | INHALATION_SPRAY | Freq: Every day | RESPIRATORY_TRACT | 2 refills | Status: AC

## 2021-01-10 NOTE — Discharge Instructions (Signed)
Information on my medicine - ELIQUIS (apixaban)  This medication education was reviewed with me or my healthcare representative as part of my discharge preparation. The pharmacist that spoke with me during my hospital stay was: Kristopher Oppenheim, Student-PharmD  Why was Eliquis prescribed for you? Eliquis was prescribed for you to reduce the risk of a blood clot forming that can cause a stroke if you have a medical condition called atrial fibrillation (a type of irregular heartbeat).  What do You need to know about Eliquis? Take your Eliquis TWICE DAILY - one tablet in the morning and one tablet in the evening with or without food. If you have difficulty swallowing the tablet whole please discuss with your pharmacist how to take the medication safely.  Take Eliquis exactly as prescribed by your doctor and DO NOT stop taking Eliquis without talking to the doctor who prescribed the medication.  Stopping may increase your risk of developing a stroke.  Refill your prescription before you run out.  After discharge, you should have regular check-up appointments with your healthcare provider that is prescribing your Eliquis.  In the future your dose may need to be changed if your kidney function or weight changes by a significant amount or as you get older.  What do you do if you miss a dose? If you miss a dose, take it as soon as you remember on the same day and resume taking twice daily.  Do not take more than one dose of ELIQUIS at the same time to make up a missed dose.  Important Safety Information A possible side effect of Eliquis is bleeding. You should call your healthcare provider right away if you experience any of the following: ? Bleeding from an injury or your nose that does not stop. ? Unusual colored urine (red or dark brown) or unusual colored stools (red or black). ? Unusual bruising for unknown reasons. ? A serious fall or if you hit your head (even if there is no  bleeding).  Some medicines may interact with Eliquis and might increase your risk of bleeding or clotting while on Eliquis. To help avoid this, consult your healthcare provider or pharmacist prior to using any new prescription or non-prescription medications, including herbals, vitamins, non-steroidal anti-inflammatory drugs (NSAIDs) and supplements.  This website has more information on Eliquis (apixaban): http://www.eliquis.com/eliquis/home

## 2021-01-10 NOTE — Progress Notes (Signed)
Patient given discharge, medication, and follow up instructions, verbalized understanding, IV and telemetry monitor removed, personal belongings and oxygen tank and concentrator with patient, spouse to transport home

## 2021-01-10 NOTE — Progress Notes (Signed)
ANTICOAGULATION CONSULT NOTE - Initial Consult  Pharmacy Consult for Apixaban Indication: atrial fibrillation  No Known Allergies  Patient Measurements: Height: 5\' 7"  (170.2 cm) Weight: 109.8 kg (242 lb) IBW/kg (Calculated) : 66.1 Heparin Dosing Weight:   Vital Signs: Temp: 97.7 F (36.5 C) (04/13 0527) Temp Source: Oral (04/13 0527) BP: 140/87 (04/13 0527) Pulse Rate: 64 (04/13 0527)  Labs: Recent Labs    01/08/21 0530 01/09/21 0354 01/10/21 0449  HGB 13.2 13.5 14.6  HCT 42.0 42.4 45.6  PLT 145* 152 155  CREATININE 0.66 0.76 0.80    Estimated Creatinine Clearance: 121.9 mL/min (by C-G formula based on SCr of 0.8 mg/dL).   Medical History: Past Medical History:  Diagnosis Date  . Allergy   . Arthritis   . Hx of colonic polyp 11/13/2016   10/2016 7 mm ssp/a - recall 2023  . Hypertension   . Hypothyroidism   . Lymphocytic colitis 04/19/2019  . Mild CAD 2009  . Obesity   . Paroxysmal atrial flutter ([place])   . Prediabetes   . Recurrent upper respiratory infection (URI)     Medications:  Scheduled:  . allopurinol  300 mg Oral Daily  . apixaban  5 mg Oral BID  . budesonide (PULMICORT) nebulizer solution  0.25 mg Nebulization BID  . busPIRone  15 mg Oral BID  . dicyclomine  20 mg Oral TID AC  . diltiazem  120 mg Oral Daily  . fluticasone  2 spray Each Nare Daily  . hydrochlorothiazide  12.5 mg Oral Daily  . ipratropium-albuterol  3 mL Nebulization QID  . levothyroxine  50 mcg Oral Daily  . loratadine  10 mg Oral Daily  . losartan  50 mg Oral Daily  . mouth rinse  15 mL Mouth Rinse BID  . methylPREDNISolone (SOLU-MEDROL) injection  40 mg Intravenous Q12H  . nicotine  21 mg Transdermal Daily  . pantoprazole  40 mg Oral BID AC  . tamsulosin  0.4 mg Oral Daily   Infusions:    Assessment: 24 yoM admitted on 4/9 with SOB, noted paroxysmal atrial flutter.  He has been on prophylactic enoxaparin.  Pharmacy is now consulted to dose Eliquis. SCr 0.8 CBC:  Hgb  and Plt WNL and stable.  Goal of Therapy:  Monitor platelets by anticoagulation protocol: Yes   Plan:  Eliquis 5 mg PO BID Pharmacy to provide education and discount card prior to discharge.   Monitor for s/s bleeding or thrombosis.    Gretta Arab PharmD, BCPS Clinical Pharmacist [facility] main pharmacy [phone] 01/10/2021 11:22 AM

## 2021-01-10 NOTE — Progress Notes (Addendum)
Progress Note  Patient Name: Ralph Mack Date of Encounter: 01/10/2021  Primary Cardiologist: Minus Breeding, MD   Subjective   Feels better today. No specific complaints.   Inpatient Medications    Scheduled Meds: . allopurinol  300 mg Oral Daily  . azithromycin  500 mg Oral Daily  . budesonide (PULMICORT) nebulizer solution  0.25 mg Nebulization BID  . busPIRone  15 mg Oral BID  . dicyclomine  20 mg Oral TID AC  . diltiazem  120 mg Oral Daily  . enoxaparin (LOVENOX) injection  55 mg Subcutaneous Q24H  . fluticasone  2 spray Each Nare Daily  . hydrochlorothiazide  12.5 mg Oral Daily  . ipratropium-albuterol  3 mL Nebulization QID  . levothyroxine  50 mcg Oral Daily  . loratadine  10 mg Oral Daily  . losartan  50 mg Oral Daily  . mouth rinse  15 mL Mouth Rinse BID  . methylPREDNISolone (SOLU-MEDROL) injection  40 mg Intravenous Q12H  . nicotine  21 mg Transdermal Daily  . pantoprazole  40 mg Oral BID AC  . tamsulosin  0.4 mg Oral Daily   Continuous Infusions:  PRN Meds: acetaminophen **OR** acetaminophen, albuterol, chlorpheniramine-HYDROcodone, hydrALAZINE, polyethylene glycol   Vital Signs    Vitals:   01/09/21 1159 01/09/21 1952 01/10/21 0527 01/10/21 0758  BP:   140/87   Pulse: 78  64   Resp: 18  20   Temp:   97.7 F (36.5 C)   TempSrc:   Oral   SpO2: 96% 97% 96% 91%  Weight:      Height:        Intake/Output Summary (Last 24 hours) at 01/10/2021 0847 Last data filed at 01/10/2021 0500 Gross per 24 hour  Intake 1240 ml  Output 4850 ml  Net -3610 ml   Filed Weights   01/06/21 0934  Weight: 109.8 kg    Physical Exam   General: Well developed, well nourished, NAD Neck: Negative for carotid bruits. No JVD Lungs:Clear to ausculation bilaterally. No wheezes, rales, or rhonchi. Breathing is unlabored. Cardiovascular: RRR with S1 S2. No murmurs Extremities: No edema. No clubbing or cyanosis. DP/PT pulses 2+ bilaterally Neuro: Alert and  oriented. No focal deficits. No facial asymmetry. MAE spontaneously. Psych: Responds to questions appropriately with normal affect.    Labs    Chemistry Recent Labs  Lab 01/06/21 0932 01/07/21 0519 01/08/21 0530 01/09/21 0354 01/10/21 0449  NA 133*   < > 138 137 138  K 4.5   < > 5.0 4.5 4.6  CL 91*   < > 92* 89* 90*  CO2 34*   < > 39* 41* 38*  GLUCOSE 106*   < > 134* 129* 130*  BUN 14   < > 19 21* 21*  CREATININE 0.58*   < > 0.66 0.76 0.80  CALCIUM 8.5*   < > 9.1 9.2 9.2  PROT 6.7  --   --   --   --   ALBUMIN 3.8  --   --   --   --   AST 22  --   --   --   --   ALT 23  --   --   --   --   ALKPHOS 55  --   --   --   --   BILITOT 0.9  --   --   --   --   GFRNONAA >60   < > >60 >60 >60  ANIONGAP 8   < >  7 7 10    < > = values in this interval not displayed.     Hematology Recent Labs  Lab 01/08/21 0530 01/09/21 0354 01/10/21 0449  WBC 10.4 10.2 8.4  RBC 4.06* 4.20* 4.57  HGB 13.2 13.5 14.6  HCT 42.0 42.4 45.6  MCV 103.4* 101.0* 99.8  MCH 32.5 32.1 31.9  MCHC 31.4 31.8 32.0  RDW 12.3 12.5 12.5  PLT 145* 152 155    Cardiac EnzymesNo results for input(s): TROPONINI in the last 168 hours. No results for input(s): TROPIPOC in the last 168 hours.   BNP Recent Labs  Lab 01/06/21 0932  BNP 72.3     DDimer No results for input(s): DDIMER in the last 168 hours.   Radiology    No results found.  Telemetry    01/10/21 NSR with stable rates- Personally Reviewed  ECG    No new tracing as of 01/10/21 - Personally Reviewed  Cardiac Studies    1. Left ventricular ejection fraction, by estimation, is 60 to 65%. Left  ventricular ejection fraction by 3D volume is 67 %. The left ventricle has  normal function. The left ventricle has no regional wall motion  abnormalities. There is mild-to-moderate  concentric left ventricular hypertrophy. Left ventricular diastolic  parameters are consistent with Grade II diastolic dysfunction  (pseudonormalization).  2.  Right ventricular systolic function is normal. The right ventricular  size is normal.  3. The mitral valve is normal in structure. Trivial mitral valve  regurgitation. No evidence of mitral stenosis.  4. The aortic valve is tricuspid. Aortic valve regurgitation is not  visualized. No aortic stenosis is present.  5. Aortic dilatation noted. There is mild dilatation of the ascending  aorta, measuring 41 mm.  6. The inferior vena cava is dilated in size with <50% respiratory  variability, suggesting right atrial pressure of 15 mmHg.   Comparison(s): No prior Echocardiogram.   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 Louanne Belton.  Assessment & Plan    1. Paroxysmal atrial flutter: -Previously seen on OP monitoring with [id number] score at 2 for HTN and coronary calcifications therefore Xarelto 20mg  PO QD discussed however patient has deferred out of worry with DOT physical. Plan to schedule f/u with Dr. Quentin Ore in reference to [facility] device placement -Continue long acting diltiazem   2. Mildly dilated ascending aorta: -Noted on echocardiogram this admission  -Repeat echo in 1 year  3. ETOH: -Recommend cessation   4. Acute hypoxic respiratory failure secondary to PNA/COPD exacerbation: -Received 2 doses of IV Lasix and pulmonary Rx withy improvement  -Remains on supplemental oxygen  -Management per primary team  -Tobacco cessation strongly encouraged   5. HTN: -Stable, 140/87>>155/84 -Amlodipine changed to diltiazem  -Previously on losartan and HCTZ   Signed, Kathyrn Drown NP-C [place] Pager: [phone] 01/10/2021, 8:47 AM     For questions or updates, please contact   Please consult [email or web address] for  contact info under Cardiology/STEMI.  Personally seen and examined. Agree with above.   55 year old with paroxysmal atrial flutter mildly dilated ascending aorta alcohol use COPD, hypertension.  Overall he is feeling better today.  Shortness of breath has improved.  No chest pain.  GEN: Well nourished, well developed, in no acute  distress  HEENT: normal , [place] O2 Neck: no JVD, carotid bruits, or masses Cardiac: RRR; no murmurs, rubs, or gallops,no edema  Respiratory:  clear to auscultation bilaterally, normal work of breathing GI: soft, nontender, nondistended, + BS MS: no deformity or atrophy  Skin: warm and dry, no rash Neuro:  Alert and Oriented x 3, Strength and sensation are intact Psych: euthymic mood, full affect  Creatinine 0.8, potassium 4.6 LDL 104 hemoglobin 14.6 hemoglobin A1c 5.6  Telemetry personally reviewed shows sinus rhythm.  Previously atrial flutter.  Assessment and plan:  Paroxysmal atrial flutter -Currently sinus rhythm.  Can be exacerbated by alcohol, potential sleep apnea.  Agree with sleep study as outpatient. -Continue with diltiazem. -Discussed anticoagulation with him at length yesterday.  Refer him to Dr. Quentin Ore for potential watchman device.  He was concerned about anticoagulation use given his job and trucking and worry about DOT physical.  Looks like he was placed on Eliquis today.  Excellent.  Acute hypoxic respiratory failure felt secondary to undiagnosed COPD -Doing better.  Also received 2 doses of Lasix.  Seems to be improved.  4 L net out yesterday.  No other suggestions at this time.  We will go ahead and sign off.  Please let us know if we can be of further assistance.  Candee Furbish, MD

## 2021-01-10 NOTE — Progress Notes (Signed)
SATURATION QUALIFICATIONS: (This note is used to comply with regulatory documentation for home oxygen)  Patient Saturations on Room Air at Rest = 99%  Patient Saturations on Room Air while Ambulating = 72%  Patient Saturations on 3 Liters of oxygen while Ambulating = 95%  Please briefly explain why patient needs home oxygen: to maintain O2 sats > 88%

## 2021-01-10 NOTE — TOC Transition Note (Signed)
Transition of Care [facility]) - CM/SW Discharge Note   Patient Details  Name: Ralph Mack MRN: [id number] Date of Birth: 06-24-1966  [facility]) CM/SW Contact:  Dessa Phi, RN Phone Number: 01/10/2021, 1:44 PM   Clinical Narrative: d/c home w/home 02-[employer] rep Zach aware of home 02 order, & delivery to rm prior d/c. No further CM needs.      Final next level of care: Home/Self Care Barriers to Discharge: No Barriers Identified   Patient Goals and CMS Choice Patient states their goals for this hospitalization and ongoing recovery are:: go home CMS Medicare.gov Compare Post Acute Care list provided to:: Patient Choice offered to / list presented to : Patient  Discharge Placement                       Discharge Plan and Services   Discharge Planning Services: CM Consult Post Acute Care Choice: Durable Medical Equipment (home oxygen)          DME Arranged: Oxygen DME Agency: [employer] Date DME Agency Contacted: 01/10/21 Time DME Agency Contacted: [phone] Representative spoke with at DME Agency: [place] [facility]) Interventions     Readmission Risk Interventions No flowsheet data found.

## 2021-01-10 NOTE — Discharge Summary (Signed)
Physician Discharge Summary  Ralph Mack CHY:[id number] DOB: 07-07-1966 DOA: 01/06/2021  PCP: [name], PA-C  Admit date: 01/06/2021 Discharge date: 01/10/2021  Admitted From: Home  Discharge disposition: Home  Recommendations for Outpatient Follow-Up:   . Follow up with your primary care provider in one week.  . Check CBC, BMP, magnesium in the next visit . Please assess for oxygen requirement in the next visit.  Patient has required 3 L of oxygen on discharge. . Patient will benefit from pulmonary function test, pulmonary follow-up as outpatient(internal referral to pulmonary provider has been made).  He would also benefit from sleep apnea study as outpatient. . Patient will need to follow-up for pulmonary nodules by pulmonary/PCP. Will need CT scan as outpatient in 6 to 12 months.  Discharge Diagnosis:   Principal Problem:   Acute respiratory failure with hypoxia (HCC) Active Problems:   Smoker   Hypothyroidism   Dyslipidemia   Essential hypertension   Morbid (severe) obesity due to excess calories [facility])  Discharge Condition: Improved.  Diet recommendation: Low sodium, heart healthy.    Wound care: None.  Code status: Full.   History of Present Illness:   Patient is a 55 years old male with history of hypertension, hyperlipidemia, morbid obesity, chronic smoking presented to the hospital with increasing shortness of breath for few weeks.  Patient had seen PCP on 12/18/2020 and was given Z-Pak and doxycycline with prednisone taper.  He had some improvement but his symptoms did not completely abate.  He then decided to come to the hospital.  In the ED, the patient was 70% on room air and placed on 15 L/min NRB.He admitted to orthopnea and intermittent leg swelling, productive cough without hemoptysis, dyspnea on exertion, easily fatigued.  In the ED patient was tachypneic, hypotensive, hypoxic.  He had mild hyponatremia with sodium of 133.  No leukocytosis.   COVID-19 and flu was negative.  Troponins were negative.  CTA of the chest was negative for PE but some infiltrate.  There was some pulmonary nodules noted as well.  Several mildly enlarged lymph nodes.  There as well.  Patient received Solu-Medrol, albuterol and then was considered for admission to hospital for acute hypoxic respiratory failure.  Hospital Course:   Following conditions were addressed during hospitalization as listed below,  Acute hypoxic respiratory failure suspect secondary to  COPD exacerbation Patient was hypoxic with pulse ox of 67% on room air at presentation, currently on  3 L/min.  Patient will require 3 L of oxygen on discharge. CTA of the chest was done which did not show any evidence of pulmonary embolism but possible infiltrate over the left lower lobe and inferior lingula consistent with pneumonia as well as right basilar atelectasis.  Patient has recently completed azithromycin, doxycycline as outpatient, is afebrile and leukocytosis is absent.  Procalcitonin is negative.    Patient has ruled out for pneumonia.  Treated for empiric bronchitis with Zithromax.     Patient will benefit from assessment for obstructive sleep apnea assessment as outpatient.    Patient received nebulizers, IV steroids incentive spirometry and flutter valve during hospitalization including inhaled steroids.   2D echocardiogram showed LV ejection fraction of 60 to 65% with grade 2 diastolic dysfunction.  Received IV Lasix twice during hospitalization.   He will be given Protonix empirically to reduce the risk of GERD. BNP was 72.3 but patient is obese.    Would likely benefit from pulmonary follow-up as outpatient for PFTs ,pulmonary nodule follow-up.  Pneumonia  was ruled out.  Patient will be given prednisone taper on discharge.  Essential hypertension Continue losartan, HCTZ . Propranolol has been discontinued. Has been started on Cardizem amlodipine has been discontinued.  Hyperlipidemia Not  on medications.    Sized lifestyle modification.  Morbid obesity Body mass index is 37.9 kg/m.  Would benefit from weight loss as outpatient.  Would benefit from sleep apnea evaluation as outpatient as well  Hypothyroidism Continue levothyroxine  LUL and RUL pulmonary nodules As per CT scan recommendation.  Plan is repeat CT in 6 to 12 months.    Patient will benefit from pulmonary evaluation as outpatient.  Tobacco use States that he failed nicotine patch nicotine gums in the past.  Patient wishes to quit at this time.  Has been started on nicotine patch.  Will prescribe on discharge.  Paroxysmal atrial flutter.  Currently normal sinus rhythm.  Received 1 dose of metoprolol and was put Cardizem which was changed to long-acting on discharge.  Patient has had a CHA2DS2-VASc score of 2, and would benefit from anticoagulation.  Patient will be given Eliquis on discharge.  I spoke with the patient regarding the risk versus benefits of being on anticoagulation.  Disposition.  At this time, patient is stable for disposition home.  I also spoke with the patient's wife on the phone in detail about the patient's clinical condition and the need for follow-up including medications.  Medical Consultants:    Cardiology  Procedures:    None Subjective:   Today, patient was seen and examined at bedside.  Feels better with breathing.  No fever, chills, increasing shortness of breath or dyspnea.  Still requires oxygen due to desaturation.  Discharge Exam:   Vitals:   01/10/21 0758 01/10/21 1411  BP:  132/82  Pulse:  71  Resp:  16  Temp:  97.8 F (36.6 C)  SpO2: 91% 98%   Vitals:   01/09/21 1952 01/10/21 0527 01/10/21 0758 01/10/21 1411  BP:  140/87  132/82  Pulse:  64  71  Resp:  20  16  Temp:  97.7 F (36.5 C)  97.8 F (36.6 C)  TempSrc:  Oral    SpO2: 97% 96% 91% 98%  Weight:      Height:       General: Alert awake, not in obvious distress, obese HENT: pupils equally  reacting to light,  No scleral pallor or icterus noted. Oral mucosa is moist.  Chest:   Diminished breath sounds bilaterally.  Minimal rhonchi. CVS: S1 &S2 heard. No murmur.  Regular rate and rhythm. Abdomen: Soft, nontender, nondistended.  Bowel sounds are heard.   Extremities: No cyanosis, clubbing or edema.  Peripheral pulses are palpable. Psych: Alert, awake and oriented, normal mood CNS:  No cranial nerve deficits.  Power equal in all extremities.   Skin: Warm and dry.  No rashes noted.  The results of significant diagnostics from this hospitalization (including imaging, microbiology, ancillary and laboratory) are listed below for reference.     Diagnostic Studies:   CT Angio Chest PE W and/or Wo Contrast  Result Date: 01/06/2021 CLINICAL DATA:  Shortness of breath EXAM: CT ANGIOGRAPHY CHEST WITH CONTRAST TECHNIQUE: Multidetector CT imaging of the chest was performed using the standard protocol during bolus administration of intravenous contrast. Multiplanar CT image reconstructions and MIPs were obtained to evaluate the vascular anatomy. CONTRAST:  80mL OMNIPAQUE IOHEXOL 350 MG/ML SOLN COMPARISON:  January 06, 2021 FINDINGS: Cardiovascular: There is no demonstrable pulmonary embolus. There is no evident  thoracic aortic aneurysm or dissection. Visualized great vessels appear unremarkable. There are foci coronary artery calcification. There is no pericardial effusion or pericardial thickening. Mediastinum/Nodes: Thyroid appears unremarkable. There is a lymph node in the aortopulmonary window region measuring 1.4 x 1.3 cm. There is a lymph node to the right of the distal trachea measuring 1.3 x 1.1 cm. There is a subcarinal lymph node measuring 1.5 x 1.4 cm. A lymph node in the right hilum measures 1.0 x 1.0 cm. Several subcentimeter lymph nodes also noted in the mediastinum. No esophageal lesions are appreciable. Lungs/Pleura: There are areas of patchy airspace opacity in the left lower lobe and  inferior lingula. On axial slice 72 series 5, there is an 8 x 7 mm nodular opacity in the posterior segment of the left upper lobe near the major fissure. On axial slice 72 series 5, there is a 5 x 4 mm nodular opacity in the anterior segment of the right upper lobe. There is atelectatic change in the right lung base. Trachea and major bronchial structures appear normal. No pleural effusion. No pneumothorax. Upper Abdomen: Visualized upper abdominal structures appear unremarkable. Musculoskeletal: No blastic or lytic bone lesions evident. No chest wall lesions. Review of the MIP images confirms the above findings. IMPRESSION: 1. No demonstrable pulmonary embolus. No thoracic aortic aneurysm or dissection. There are foci of coronary artery calcification. 2. Areas of airspace opacity in the left lower lobe and inferior lingula consistent with pneumonia. Right base atelectasis. 3. 8 x 7 mm nodular opacity in the posterior segment left upper lobe near the major fissure. There is a 5 x 4 mm nodular opacity in the anterior segment of the right upper lobe. Non-contrast chest CT at 6-12 months is recommended. If the nodule is stable at time of repeat CT, then future CT at 18-24 months (from today's scan) is considered optional for low-risk patients, but is recommended for high-risk patients. This recommendation follows the consensus statement: Guidelines for Management of Incidental Pulmonary Nodules Detected on CT Images: From the Fleischner Society 2017; Radiology 2017; 284:228-243. 4. Several mildly enlarged lymph nodes of uncertain etiology. Lymph node prominence of this nature potentially could have reactive etiology given the areas of pneumonia. Neoplastic etiology cannot be excluded in this circumstance, however. Electronically Signed   By: Lowella Grip III M.D.   On: 01/06/2021 11:59   DG Chest Portable 1 View  Result Date: 01/06/2021 CLINICAL DATA:  55 year old male with history of shortness of breath,  cough and chest pain. EXAM: PORTABLE CHEST 1 VIEW COMPARISON:  Chest x-ray 12/06/2020. FINDINGS: Lung volumes are normal. Ill-defined opacities are noted throughout the left lung base. No pleural effusions. No pneumothorax. No pulmonary nodule or mass noted. Pulmonary vasculature and the cardiomediastinal silhouette are within normal limits. IMPRESSION: 1. Ill-defined opacities at the left base concerning for potential bronchopneumonia. Followup PA and lateral chest X-ray is recommended in 3-4 weeks following trial of antibiotic therapy to ensure resolution and exclude underlying malignancy. Electronically Signed   By: Vinnie Langton M.D.   On: 01/06/2021 10:48   ECHOCARDIOGRAM COMPLETE  Result Date: 01/07/2021    ECHOCARDIOGRAM REPORT   Patient Name:   BRAXLEY BALANDRAN Date of Exam: 01/07/2021 Medical Rec #:  [id number]     Height:       67.0 in Accession #:    [id number]    Weight:       242.0 lb Date of Birth:  June 16, 1966     BSA:  2.193 m Patient Age:    62 years      BP:           158/82 mmHg Patient Gender: M             HR:           73 bpm. Exam Location:  Inpatient Procedure: 2D Echo, 3D Echo, Cardiac Doppler and Color Doppler Indications:    R06.02 SOB  History:        Patient has no prior history of Echocardiogram examinations.                 Signs/Symptoms:Chest Pain, Shortness of Breath and Dyspnea; Risk                 Factors:Current Smoker, Hypertension and Dyslipidemia. Substance                 abuse. ETOH.  Sonographer:    Roseanna Rainbow RDCS Referring Phys: [id number] [place]  Sonographer Comments: Patient is morbidly obese and Technically difficult study due to poor echo windows. Image acquisition challenging due to patient body habitus. IMPRESSIONS  1. Left ventricular ejection fraction, by estimation, is 60 to 65%. Left ventricular ejection fraction by 3D volume is 67 %. The left ventricle has normal function. The left ventricle has no regional wall motion abnormalities. There is  mild-to-moderate concentric left ventricular hypertrophy. Left ventricular diastolic parameters are consistent with Grade II diastolic dysfunction (pseudonormalization).  2. Right ventricular systolic function is normal. The right ventricular size is normal.  3. The mitral valve is normal in structure. Trivial mitral valve regurgitation. No evidence of mitral stenosis.  4. The aortic valve is tricuspid. Aortic valve regurgitation is not visualized. No aortic stenosis is present.  5. Aortic dilatation noted. There is mild dilatation of the ascending aorta, measuring 41 mm.  6. The inferior vena cava is dilated in size with <50% respiratory variability, suggesting right atrial pressure of 15 mmHg. Comparison(s): No prior Echocardiogram. FINDINGS  Left Ventricle: Left ventricular ejection fraction, by estimation, is 60 to 65%. Left ventricular ejection fraction by 3D volume is 67 %. The left ventricle has normal function. The left ventricle has no regional wall motion abnormalities. The left ventricular internal cavity size was normal in size. There is mild-to-moderate concentric left ventricular hypertrophy. Left ventricular diastolic parameters are consistent with Grade II diastolic dysfunction (pseudonormalization). Right Ventricle: The right ventricular size is normal. No increase in right ventricular wall thickness. Right ventricular systolic function is normal. Left Atrium: Left atrial size was normal in size. Right Atrium: Right atrial size was normal in size. Pericardium: There is no evidence of pericardial effusion. Mitral Valve: The mitral valve is normal in structure. There is mild thickening of the mitral valve leaflet(s). There is mild calcification of the mitral valve leaflet(s). Mild mitral annular calcification. Trivial mitral valve regurgitation. No evidence  of mitral valve stenosis. Tricuspid Valve: The tricuspid valve is normal in structure. Tricuspid valve regurgitation is trivial. Aortic Valve:  The aortic valve is tricuspid. Aortic valve regurgitation is not visualized. No aortic stenosis is present. Pulmonic Valve: The pulmonic valve was not well visualized. Pulmonic valve regurgitation is trivial. Aorta: Aortic dilatation noted. There is mild dilatation of the ascending aorta, measuring 41 mm. Venous: The inferior vena cava is dilated in size with less than 50% respiratory variability, suggesting right atrial pressure of 15 mmHg. IAS/Shunts: No atrial level shunt detected by color flow Doppler.  LEFT VENTRICLE PLAX 2D LVIDd:  5.70 cm         Diastology LVIDs:         3.60 cm         LV e' medial:    7.07 cm/s LV PW:         1.20 cm         LV E/e' medial:  19.1 LV IVS:        1.60 cm         LV e' lateral:   7.62 cm/s LVOT diam:     2.20 cm         LV E/e' lateral: 17.7 LV SV:         139 LV SV Index:   63 LVOT Area:     3.80 cm        3D Volume EF                                LV 3D EF:    Left                                             ventricular LV Volumes (MOD)                            ejection LV vol d, MOD    147.0 ml                   fraction by A2C:                                        3D volume LV vol d, MOD    115.0 ml                   is 67 %. A4C: LV vol s, MOD    44.7 ml A2C:                           3D Volume EF: LV vol s, MOD    42.0 ml       3D EF:        67 % A4C: LV SV MOD A2C:   102.3 ml LV SV MOD A4C:   115.0 ml LV SV MOD BP:    86.3 ml RIGHT VENTRICLE             IVC RV S prime:     17.00 cm/s  IVC diam: 2.70 cm TAPSE (M-mode): 3.0 cm LEFT ATRIUM             Index       RIGHT ATRIUM           Index LA diam:        3.70 cm 1.69 cm/m  RA Area:     17.60 cm LA Vol (A2C):   63.9 ml 29.14 ml/m RA Volume:   47.40 ml  21.62 ml/m LA Vol (A4C):   56.0 ml 25.54 ml/m LA Biplane Vol: 65.2 ml 29.73 ml/m  AORTIC VALVE LVOT Vmax:   167.00 cm/s LVOT Vmean:  107.000 cm/s LVOT VTI:    0.365 m  AORTA Ao Root diam:  3.90 cm Ao Asc diam:  4.10 cm MITRAL VALVE MV Area (PHT): 3.99  cm     SHUNTS MV Decel Time: 190 msec     Systemic VTI:  0.36 m MV E velocity: 135.00 cm/s  Systemic Diam: 2.20 cm MV A velocity: 116.00 cm/s MV E/A ratio:  1.16 Gwyndolyn Kaufman MD Electronically signed by Gwyndolyn Kaufman MD Signature Date/Time: 01/07/2021/1:08:58 PM    Final      Labs:   Basic Metabolic Panel: Recent Labs  Lab 01/06/21 0932 01/07/21 0519 01/08/21 0530 01/09/21 0354 01/10/21 0449  NA 133* 137 138 137 138  K 4.5 4.7 5.0 4.5 4.6  CL 91* 93* 92* 89* 90*  CO2 34* 36* 39* 41* 38*  GLUCOSE 106* 174* 134* 129* 130*  BUN 14 15 19  21* 21*  CREATININE 0.58* 0.69 0.66 0.76 0.80  CALCIUM 8.5* 8.9 9.1 9.2 9.2  MG  --   --  2.1 2.2 2.4  PHOS  --   --  3.8 3.8 4.8*   GFR Estimated Creatinine Clearance: 121.9 mL/min (by C-G formula based on SCr of 0.8 mg/dL). Liver Function Tests: Recent Labs  Lab 01/06/21 0932  AST 22  ALT 23  ALKPHOS 55  BILITOT 0.9  PROT 6.7  ALBUMIN 3.8   No results for input(s): LIPASE, AMYLASE in the last 168 hours. No results for input(s): AMMONIA in the last 168 hours. Coagulation profile No results for input(s): INR, PROTIME in the last 168 hours.  CBC: Recent Labs  Lab 01/06/21 0932 01/07/21 0519 01/08/21 0530 01/09/21 0354 01/10/21 0449  WBC 5.7 6.2 10.4 10.2 8.4  NEUTROABS 3.5  --   --   --   --   HGB 14.2 14.0 13.2 13.5 14.6  HCT 43.0 44.4 42.0 42.4 45.6  MCV 98.6 102.8* 103.4* 101.0* 99.8  PLT 143* 147* 145* 152 155   Cardiac Enzymes: No results for input(s): CKTOTAL, CKMB, CKMBINDEX, TROPONINI in the last 168 hours. BNP: Invalid input(s): POCBNP CBG: No results for input(s): GLUCAP in the last 168 hours. D-Dimer No results for input(s): DDIMER in the last 72 hours. Hgb A1c Recent Labs    01/10/21 0449  HGBA1C 5.6   Lipid Profile Recent Labs    01/10/21 0450  CHOL 191  HDL 66  LDLCALC 104*  TRIG 105  CHOLHDL 2.9   Thyroid function studies No results for input(s): TSH, T4TOTAL, T3FREE, THYROIDAB in  the last 72 hours.  Invalid input(s): FREET3 Anemia work up No results for input(s): VITAMINB12, FOLATE, FERRITIN, TIBC, IRON, RETICCTPCT in the last 72 hours. Microbiology Recent Results (from the past 240 hour(s))  Resp Panel by RT-PCR (Flu A&B, Covid) Nasopharyngeal Swab     Status: None   Collection Time: 01/06/21  9:25 AM   Specimen: Nasopharyngeal Swab; Nasopharyngeal(NP) swabs in vial transport medium  Result Value Ref Range Status   SARS Coronavirus 2 by RT PCR NEGATIVE NEGATIVE Final    Comment: (NOTE) SARS-CoV-2 target nucleic acids are NOT DETECTED.  The SARS-CoV-2 RNA is generally detectable in upper respiratory specimens during the acute phase of infection. The lowest concentration of SARS-CoV-2 viral copies this assay can detect is 138 copies/mL. A negative result does not preclude SARS-Cov-2 infection and should not be used as the sole basis for treatment or other patient management decisions. A negative result may occur with  improper specimen collection/handling, submission of specimen other than nasopharyngeal swab, presence of viral mutation(s) within the areas targeted by this assay, and inadequate  number of viral copies(<138 copies/mL). A negative result must be combined with clinical observations, patient history, and epidemiological information. The expected result is Negative.  Fact Sheet for Patients:  EntrepreneurPulse.com.au  Fact Sheet for Healthcare Providers:  IncredibleEmployment.be  This test is no t yet approved or cleared by the [place] FDA and  has been authorized for detection and/or diagnosis of SARS-CoV-2 by FDA under an Emergency Use Authorization (EUA). This EUA will remain  in effect (meaning this test can be used) for the duration of the COVID-19 declaration under Section 564(b)(1) of the Act, 21 U.S.C.section 360bbb-3(b)(1), unless the authorization is terminated  or revoked sooner.        Influenza A by PCR NEGATIVE NEGATIVE Final   Influenza B by PCR NEGATIVE NEGATIVE Final    Comment: (NOTE) The Xpert Xpress SARS-CoV-2/FLU/RSV plus assay is intended as an aid in the diagnosis of influenza from Nasopharyngeal swab specimens and should not be used as a sole basis for treatment. Nasal washings and aspirates are unacceptable for Xpert Xpress SARS-CoV-2/FLU/RSV testing.  Fact Sheet for Patients: EntrepreneurPulse.com.au  Fact Sheet for Healthcare Providers: IncredibleEmployment.be  This test is not yet approved or cleared by the [place] FDA and has been authorized for detection and/or diagnosis of SARS-CoV-2 by FDA under an Emergency Use Authorization (EUA). This EUA will remain in effect (meaning this test can be used) for the duration of the COVID-19 declaration under Section 564(b)(1) of the Act, 21 U.S.C. section 360bbb-3(b)(1), unless the authorization is terminated or revoked.  Performed at [facility], [place] [address], [place], [place] [address]   MRSA PCR Screening     Status: None   Collection Time: 01/06/21  4:31 PM   Specimen: Nasopharyngeal  Result Value Ref Range Status   MRSA by PCR NEGATIVE NEGATIVE Final    Comment:        The GeneXpert MRSA Assay (FDA approved for NASAL specimens only), is one component of a comprehensive MRSA colonization surveillance program. It is not intended to diagnose MRSA infection nor to guide or monitor treatment for MRSA infections. Performed at [facility], [place] [address] [place], [place] [address]      Discharge Instructions:   Discharge Instructions     Ambulatory referral to Pulmonology   Complete by: As directed    Reason for referral: Asthma/COPD   Call MD for:   Complete by: As directed    Worsening symptoms including fever, increasing cough with phlegm, chest pain, worsening shortness of breath   Diet - low sodium  heart healthy   Complete by: As directed    Discharge instructions   Complete by: As directed    Follow-up with your primary care provider within 7 days.  Discuss about getting a sleep study as outpatient.  Discussed about getting a pulmonary referral to check your lung function test.  Continue to use oxygen as prescribed and wean as able.  No smoking place.  You have been prescribed nicotine patch to assist in quitting smoking.  Discuss with your primary care provider regarding blood thinners.   Increase activity slowly   Complete by: As directed    Pulmonary rehab therapeutic exercise   Complete by: As directed       Allergies as of 01/10/2021   No Known Allergies      Medication List     STOP taking these medications    amLODipine 5 MG tablet Commonly known as: NORVASC   azithromycin 250  MG tablet Commonly known as: ZITHROMAX   doxycycline 100 MG tablet Commonly known as: VIBRA-TABS       TAKE these medications    albuterol 108 (90 Base) MCG/ACT inhaler Commonly known as: VENTOLIN HFA Inhale 2 puffs into the lungs every 4 (four) hours as needed for wheezing or shortness of breath. What changed: when to take this   allopurinol 300 MG tablet Commonly known as: ZYLOPRIM Take 1 tablet (300 mg total) by mouth daily.   apixaban 5 MG Tabs tablet Commonly known as: ELIQUIS Take 1 tablet (5 mg total) by mouth 2 (two) times daily.   benzonatate 200 MG capsule Commonly known as: TESSALON Take 1 capsule (200 mg total) by mouth 3 (three) times daily as needed for cough.   busPIRone 15 MG tablet Commonly known as: BUSPAR Take 1/3 tablet p.o. twice daily for 1 week, then take 2/3 tablet p.o. twice daily for 1 week, then take 1 tablet p.o. twice daily   dicyclomine 20 MG tablet Commonly known as: BENTYL TAKE 1 TABLET BY MOUTH 4 TIMES DAILY BEFORE MEAL(S) AND AT BEDTIME AS NEEDED What changed: See the new instructions.   diltiazem 120 MG 24 hr capsule Commonly known  as: CARDIZEM CD Take 1 capsule (120 mg total) by mouth daily. Start taking on: January 11, 2021   diphenhydrAMINE HCl (Sleep) 25 MG Caps Take by mouth. Takes 1-2 tabs po QHS   fexofenadine 180 MG tablet Commonly known as: ALLEGRA Take 180 mg by mouth daily.   fluticasone 50 MCG/ACT nasal spray Commonly known as: FLONASE Place 2 sprays into the nose daily.   fluticasone furoate-vilanterol 200-25 MCG/INH Aepb Commonly known as: BREO ELLIPTA Inhale 1 puff into the lungs daily.   hydrochlorothiazide 12.5 MG capsule Commonly known as: MICROZIDE Take 1 capsule (12.5 mg total) by mouth daily.   hydrocortisone 2.5 % rectal cream Commonly known as: Anusol-HC Place 1 application rectally 2 (two) times daily.   levothyroxine 50 MCG tablet Commonly known as: SYNTHROID Take 1 tablet (50 mcg total) by mouth daily.   losartan 50 MG tablet Commonly known as: COZAAR Take 1 tablet (50 mg total) by mouth daily.   nicotine 21 mg/24hr patch Commonly known as: NICODERM CQ - dosed in mg/24 hours Place 1 patch (21 mg total) onto the skin daily. Start taking on: January 11, 2021   omega-3 acid ethyl esters 1 g capsule Commonly known as: LOVAZA Take 2 capsules (2 g total) by mouth 2 (two) times daily.   pantoprazole 40 MG tablet Commonly known as: PROTONIX Take 1 tablet (40 mg total) by mouth 2 (two) times daily before a meal for 10 days, THEN 1 tablet (40 mg total) daily. Start taking on: January 10, 2021   Potassium 99 MG Tabs Take 3-4 tablets by mouth daily.   predniSONE 10 MG (21) Tbpk tablet Commonly known as: STERAPRED UNI-PAK 21 TAB 3 tab orally daily x 2 days then 2 tab po daily x 2 days then 1 tab po daily x 2 days What changed: additional instructions   SUPER MEGA VITE 75/BETA CARO PO Take by mouth.   tamsulosin 0.4 MG Caps capsule Commonly known as: FLOMAX Take 1 capsule by mouth once daily   Vitamin D3 125 MCG (5000 UT) Tabs 5,000 IU OTC vitamin D3 daily.                Durable Medical Equipment  (From admission, onward)  Start     Ordered   01/10/21 1151  For home use only DME oxygen  Once       Question Answer Comment  Length of Need 12 Months   Mode or (Route) Nasal cannula   Liters per Minute 3   Frequency Continuous (stationary and portable oxygen unit needed)   Oxygen conserving device Yes   Oxygen delivery system Gas      01/10/21 1150            Follow-up Information     [facility] Follow up.   Why: home oxygen Contact information: [place] [id number] [phone]                  Time coordinating discharge: 39 minutes  Signed:  Ritesh Opara  [facility] 01/10/2021, 2:16 PM

## 2021-01-10 NOTE — Plan of Care (Signed)

## 2021-01-11 ENCOUNTER — Telehealth: Payer: Self-pay | Admitting: *Deleted

## 2021-01-11 NOTE — Telephone Encounter (Signed)
Transition Care Management Unsuccessful Follow-up Telephone Call  Date of discharge and from where:  01/10/2021 - [facility]  Attempts:  1st Attempt  Reason for unsuccessful TCM follow-up call:  Left voice message

## 2021-01-12 NOTE — Telephone Encounter (Signed)
Transition Care Management Unsuccessful Follow-up Telephone Call  Date of discharge and from where:  01/10/2021 - [facility]  Attempts:  2nd Attempt  Reason for unsuccessful TCM follow-up call:  Left voice message

## 2021-01-15 ENCOUNTER — Encounter: Payer: Self-pay | Admitting: Nurse Practitioner

## 2021-01-15 NOTE — Telephone Encounter (Signed)
Please check into the status of the referral to Pulm. AS, CMA

## 2021-01-15 NOTE — Telephone Encounter (Signed)
Transition Care Management Unsuccessful Follow-up Telephone Call  Date of discharge and from where:  01/10/2021 - [facility]  Attempts:  3rd Attempt  Reason for unsuccessful TCM follow-up call:  Left voice message

## 2021-01-16 ENCOUNTER — Encounter: Payer: Self-pay | Admitting: Physician Assistant

## 2021-01-16 ENCOUNTER — Ambulatory Visit (INDEPENDENT_AMBULATORY_CARE_PROVIDER_SITE_OTHER): Payer: BC Managed Care – PPO | Admitting: Physician Assistant

## 2021-01-16 ENCOUNTER — Other Ambulatory Visit: Payer: Self-pay

## 2021-01-16 VITALS — BP 116/73 | HR 86 | Temp 99.1°F | Ht 67.0 in | Wt 249.2 lb

## 2021-01-16 DIAGNOSIS — I4892 Unspecified atrial flutter: Secondary | ICD-10-CM

## 2021-01-16 DIAGNOSIS — Z09 Encounter for follow-up examination after completed treatment for conditions other than malignant neoplasm: Secondary | ICD-10-CM | POA: Diagnosis not present

## 2021-01-16 DIAGNOSIS — G479 Sleep disorder, unspecified: Secondary | ICD-10-CM

## 2021-01-16 DIAGNOSIS — I10 Essential (primary) hypertension: Secondary | ICD-10-CM

## 2021-01-16 DIAGNOSIS — J9601 Acute respiratory failure with hypoxia: Secondary | ICD-10-CM

## 2021-01-16 DIAGNOSIS — E785 Hyperlipidemia, unspecified: Secondary | ICD-10-CM

## 2021-01-16 DIAGNOSIS — J189 Pneumonia, unspecified organism: Secondary | ICD-10-CM

## 2021-01-16 DIAGNOSIS — Z716 Tobacco abuse counseling: Secondary | ICD-10-CM

## 2021-01-16 DIAGNOSIS — E039 Hypothyroidism, unspecified: Secondary | ICD-10-CM

## 2021-01-16 NOTE — Progress Notes (Signed)
Established Patient Office Visit  Subjective:  Patient ID: Ralph Mack, male    DOB: 1965/12/28  Age: 55 y.o. MRN: [id number]  CC:  Chief Complaint  Patient presents with  . Hospitalization Follow-up    HPI Ralph Mack presents for hospital follow up. Patient reports is gradually feeling better. States pulse oxygen has slowly improved, reports it was 93% earlier today. Continues to use oxygen as needed. Currently does not have supplemental oxygen in office. Reports since using supplemental oxygen especially at night has noticed an improvement with sleeping better and feeling more rested the next day. Has stopped smoking since hospitalization and is wearing nicotine patch. States was used to smoking more when on the road out of boredom and does not smoke as much when he is home because he stays busy with doing house work and other activities. Is concerned about lung nodules that were discovered on CT. Denies shortness of breath, chest pain, dizziness, palpitations, fever, and orthopnea. States wheezing and headache are improving.  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  Discharge Summary:  Admit date: 01/06/2021 Discharge date: 01/10/2021  Admitted From: Home  Discharge disposition: Home  Recommendations for Outpatient Follow-Up:    Follow up with your primary care provider in one week.   Check CBC, BMP, magnesium in the next visit  Please assess for oxygen requirement in the next visit.  Patient has required 3 L of oxygen on discharge.  Patient will benefit from pulmonary function test, pulmonary follow-up as outpatient(internal referral to pulmonary provider has been made).  He would also benefit from sleep apnea study as outpatient.  Patient will need to follow-up for pulmonary nodules by pulmonary/PCP. Will need CT scan as outpatient in 6 to 12 months.  Discharge Diagnosis:    Principal Problem:   Acute respiratory failure with hypoxia (HCC) Active Problems:   Smoker   Hypothyroidism   Dyslipidemia   Essential hypertension   Morbid (severe) obesity due to excess calories [facility])  Discharge Condition: Improved.  Diet recommendation: Low sodium, heart healthy.    Wound care: None.  Code status: Full.   History of Present Illness:   Patient is a 55 years old male with history of hypertension, hyperlipidemia, morbid obesity, chronic smoking presented to the hospital with increasing shortness of breath for few weeks. Patient had seen PCP on 12/18/2020 and was given Z-Pak and doxycycline with prednisone taper. He had some improvement but his symptoms did not completely abate. He then decided to come to the hospital. In the ED,the patient was 70% on room air and placed on 15 L/min NRB.He admitted to orthopnea and intermittent leg swelling, productive cough without hemoptysis, dyspnea on exertion, easily fatigued. In the ED patient was tachypneic, hypotensive, hypoxic. He had mild hyponatremia with sodium of 133. No leukocytosis. COVID-19 and flu was negative. Troponins were negative. CTA of the chest was negative for PE but some infiltrate. There was some pulmonary nodules noted as well. Several mildly enlarged lymph nodes. There as well. Patient received Solu-Medrol, albuterol and then was considered for admission to hospital for acute hypoxic respiratory failure.  Hospital Course:   Following conditions were addressed during hospitalization as listed below,  Acute hypoxic respiratory failure suspect secondary to COPD exacerbation Patient was hypoxic with pulse ox of 67% on room air at presentation, currently on 3 L/min.  Patient will require 3 L of oxygen on discharge.CTA of the  chest was done which did not show any evidence of pulmonary embolism but possible infiltrate over the left lower lobe and inferior lingula consistent with  pneumonia as well as right basilar atelectasis. Patient has recently completed azithromycin, doxycycline as outpatient,is afebrile and leukocytosis is absent. Procalcitonin is negative.   Patient has ruled out for pneumonia.  Treated for empiric bronchitis with Zithromax.    Patient will benefit from assessment for obstructive sleep apnea assessment as outpatient.   Patient received nebulizers, IV steroids incentive spirometry and flutter valve during hospitalization including inhaled steroids.  2D echocardiogram showed LV ejection fraction of 60 to 65% with grade 2 diastolic dysfunction. Received IV Lasix twice during hospitalization.  He will be given Protonix empirically to reduce the risk of GERD.BNP was 72.3 but patient is obese.  Would likely benefit from pulmonary follow-up as outpatient for PFTs ,pulmonary nodule follow-up.Pneumonia was ruled out.  Patient will be given prednisone taper on discharge.  Essential hypertension Continue losartan,HCTZ . Propranolol has been discontinued.Has been started on Cardizem amlodipine has been discontinued.  Hyperlipidemia Not on medications.   Sized lifestyle modification.  Morbid obesity Body mass index is 37.9 kg/m. Would benefit from weight loss as outpatient. Would benefit from sleep apnea evaluation as outpatient as well  Hypothyroidism Continue levothyroxine  LUL and RUL pulmonary nodules As per CT scan recommendation. Plan is repeat CT in 6 to 12 months.   Patient will benefit from pulmonary evaluation as outpatient.  Tobacco use States that he failed nicotine patch nicotine gums in the past. Patient wishes to quit at this time.Has been started on nicotine patch.  Will prescribe on discharge.  Paroxysmal atrial flutter. Currently normal sinus rhythm. Received 1 dose of metoprolol and was put Cardizem which was changed to long-acting on discharge. Patient has had a CHA2DS2-VASc score of 2, and would benefit  from anticoagulation.  Patient will be given Eliquis on discharge.  I spoke with the patient regarding the risk versus benefits of being on anticoagulation.  Disposition.  At this time, patient is stable for disposition home.  I also spoke with the patient's wife on the phone in detail about the patient's clinical condition and the need for follow-up including medications.  Medical Consultants:    Cardiology  Procedures:    None Subjective:   Today, patient was seen and examined at bedside.  Feels better with breathing.  No fever, chills, increasing shortness of breath or dyspnea.  Still requires oxygen due to desaturation.  Discharge Exam:       Vitals:   01/10/21 0758 01/10/21 1411  BP:  132/82  Pulse:  71  Resp:  16  Temp:  97.8 F (36.6 C)  SpO2: 91% 98%         Vitals:   01/09/21 1952 01/10/21 0527 01/10/21 0758 01/10/21 1411  BP:  140/87  132/82  Pulse:  64  71  Resp:  20  16  Temp:  97.7 F (36.5 C)  97.8 F (36.6 C)  TempSrc:  Oral    SpO2: 97% 96% 91% 98%  Weight:      Height:       General: Alert awake, not in obvious distress, obese HENT: pupils equally reacting to light,  No scleral pallor or icterus noted. Oral mucosa is moist.  Chest:   Diminished breath sounds bilaterally.  Minimal rhonchi. CVS: S1 &S2 heard. No murmur.  Regular rate and rhythm. Abdomen: Soft, nontender, nondistended.  Bowel sounds are heard.   Extremities: No  cyanosis, clubbing or edema.  Peripheral pulses are palpable. Psych: Alert, awake and oriented, normal mood CNS:  No cranial nerve deficits.  Power equal in all extremities.   Skin: Warm and dry.  No rashes noted.  The results of significant diagnostics from this hospitalization (including imaging, microbiology, ancillary and laboratory) are listed below for reference.     Diagnostic Studies:   CT Angio Chest PE W and/or Wo Contrast  Result Date: 01/06/2021 CLINICAL DATA:   Shortness of breath EXAM: CT ANGIOGRAPHY CHEST WITH CONTRAST TECHNIQUE: Multidetector CT imaging of the chest was performed using the standard protocol during bolus administration of intravenous contrast. Multiplanar CT image reconstructions and MIPs were obtained to evaluate the vascular anatomy. CONTRAST:  36m OMNIPAQUE IOHEXOL 350 MG/ML SOLN COMPARISON:  January 06, 2021 FINDINGS: Cardiovascular: There is no demonstrable pulmonary embolus. There is no evident thoracic aortic aneurysm or dissection. Visualized great vessels appear unremarkable. There are foci coronary artery calcification. There is no pericardial effusion or pericardial thickening. Mediastinum/Nodes: Thyroid appears unremarkable. There is a lymph node in the aortopulmonary window region measuring 1.4 x 1.3 cm. There is a lymph node to the right of the distal trachea measuring 1.3 x 1.1 cm. There is a subcarinal lymph node measuring 1.5 x 1.4 cm. A lymph node in the right hilum measures 1.0 x 1.0 cm. Several subcentimeter lymph nodes also noted in the mediastinum. No esophageal lesions are appreciable. Lungs/Pleura: There are areas of patchy airspace opacity in the left lower lobe and inferior lingula. On axial slice 72 series 5, there is an 8 x 7 mm nodular opacity in the posterior segment of the left upper lobe near the major fissure. On axial slice 72 series 5, there is a 5 x 4 mm nodular opacity in the anterior segment of the right upper lobe. There is atelectatic change in the right lung base. Trachea and major bronchial structures appear normal. No pleural effusion. No pneumothorax. Upper Abdomen: Visualized upper abdominal structures appear unremarkable. Musculoskeletal: No blastic or lytic bone lesions evident. No chest wall lesions. Review of the MIP images confirms the above findings. IMPRESSION: 1. No demonstrable pulmonary embolus. No thoracic aortic aneurysm or dissection. There are foci of coronary artery calcification. 2. Areas of  airspace opacity in the left lower lobe and inferior lingula consistent with pneumonia. Right base atelectasis. 3. 8 x 7 mm nodular opacity in the posterior segment left upper lobe near the major fissure. There is a 5 x 4 mm nodular opacity in the anterior segment of the right upper lobe. Non-contrast chest CT at 6-12 months is recommended. If the nodule is stable at time of repeat CT, then future CT at 18-24 months (from today's scan) is considered optional for low-risk patients, but is recommended for high-risk patients. This recommendation follows the consensus statement: Guidelines for Management of Incidental Pulmonary Nodules Detected on CT Images: From the Fleischner Society 2017; Radiology 2017; 284:228-243. 4. Several mildly enlarged lymph nodes of uncertain etiology. Lymph node prominence of this nature potentially could have reactive etiology given the areas of pneumonia. Neoplastic etiology cannot be excluded in this circumstance, however. Electronically Signed   By: WLowella GripIII M.D.   On: 01/06/2021 11:59   DG Chest Portable 1 View  Result Date: 01/06/2021 CLINICAL DATA:  55year old male with history of shortness of breath, cough and chest pain. EXAM: PORTABLE CHEST 1 VIEW COMPARISON:  Chest x-ray 12/06/2020. FINDINGS: Lung volumes are normal. Ill-defined opacities are noted throughout the left lung  base. No pleural effusions. No pneumothorax. No pulmonary nodule or mass noted. Pulmonary vasculature and the cardiomediastinal silhouette are within normal limits. IMPRESSION: 1. Ill-defined opacities at the left base concerning for potential bronchopneumonia. Followup PA and lateral chest X-ray is recommended in 3-4 weeks following trial of antibiotic therapy to ensure resolution and exclude underlying malignancy. Electronically Signed   By: Vinnie Langton M.D.   On: 01/06/2021 10:48   ECHOCARDIOGRAM COMPLETE  Result Date: 01/07/2021    ECHOCARDIOGRAM REPORT   Patient Name:   Chipper  Conover Date of Exam: 01/07/2021 Medical Rec #:  [id number]     Height:       67.0 in Accession #:    [id number]    Weight:       242.0 lb Date of Birth:  [birth year]     BSA:          2.193 m Patient Age:    18 years      BP:           158/82 mmHg Patient Gender: M             HR:           73 bpm. Exam Location:  Inpatient Procedure: 2D Echo, 3D Echo, Cardiac Doppler and Color Doppler Indications:    R06.02 SOB  History:        Patient has no prior history of Echocardiogram examinations.                 Signs/Symptoms:Chest Pain, Shortness of Breath and Dyspnea; Risk                 Factors:Current Smoker, Hypertension and Dyslipidemia. Substance                 abuse. ETOH.  Sonographer:    Roseanna Rainbow RDCS Referring Phys: [id number] Celeste  Sonographer Comments: Patient is morbidly obese and Technically difficult study due to poor echo windows. Image acquisition challenging due to patient body habitus. IMPRESSIONS  1. Left ventricular ejection fraction, by estimation, is 60 to 65%. Left ventricular ejection fraction by 3D volume is 67 %. The left ventricle has normal function. The left ventricle has no regional wall motion abnormalities. There is mild-to-moderate concentric left ventricular hypertrophy. Left ventricular diastolic parameters are consistent with Grade II diastolic dysfunction (pseudonormalization).  2. Right ventricular systolic function is normal. The right ventricular size is normal.  3. The mitral valve is normal in structure. Trivial mitral valve regurgitation. No evidence of mitral stenosis.  4. The aortic valve is tricuspid. Aortic valve regurgitation is not visualized. No aortic stenosis is present.  5. Aortic dilatation noted. There is mild dilatation of the ascending aorta, measuring 41 mm.  6. The inferior vena cava is dilated in size with <50% respiratory variability, suggesting right atrial pressure of 15 mmHg. Comparison(s): No prior Echocardiogram. FINDINGS  Left Ventricle: Left  ventricular ejection fraction, by estimation, is 60 to 65%. Left ventricular ejection fraction by 3D volume is 67 %. The left ventricle has normal function. The left ventricle has no regional wall motion abnormalities. The left ventricular internal cavity size was normal in size. There is mild-to-moderate concentric left ventricular hypertrophy. Left ventricular diastolic parameters are consistent with Grade II diastolic dysfunction (pseudonormalization). Right Ventricle: The right ventricular size is normal. No increase in right ventricular wall thickness. Right ventricular systolic function is normal. Left Atrium: Left atrial size was normal in size. Right Atrium: Right atrial size was normal in  size. Pericardium: There is no evidence of pericardial effusion. Mitral Valve: The mitral valve is normal in structure. There is mild thickening of the mitral valve leaflet(s). There is mild calcification of the mitral valve leaflet(s). Mild mitral annular calcification. Trivial mitral valve regurgitation. No evidence  of mitral valve stenosis. Tricuspid Valve: The tricuspid valve is normal in structure. Tricuspid valve regurgitation is trivial. Aortic Valve: The aortic valve is tricuspid. Aortic valve regurgitation is not visualized. No aortic stenosis is present. Pulmonic Valve: The pulmonic valve was not well visualized. Pulmonic valve regurgitation is trivial. Aorta: Aortic dilatation noted. There is mild dilatation of the ascending aorta, measuring 41 mm. Venous: The inferior vena cava is dilated in size with less than 50% respiratory variability, suggesting right atrial pressure of 15 mmHg. IAS/Shunts: No atrial level shunt detected by color flow Doppler.  LEFT VENTRICLE PLAX 2D LVIDd:         5.70 cm         Diastology LVIDs:         3.60 cm         LV e' medial:    7.07 cm/s LV PW:         1.20 cm         LV E/e' medial:  19.1 LV IVS:        1.60 cm         LV e' lateral:   7.62 cm/s LVOT diam:     2.20 cm          LV E/e' lateral: 17.7 LV SV:         139 LV SV Index:   63 LVOT Area:     3.80 cm        3D Volume EF                                LV 3D EF:    Left                                             ventricular LV Volumes (MOD)                            ejection LV vol d, MOD    147.0 ml                   fraction by A2C:                                        3D volume LV vol d, MOD    115.0 ml                   is 67 %. A4C: LV vol s, MOD    44.7 ml A2C:                           3D Volume EF: LV vol s, MOD    42.0 ml       3D EF:        67 % A4C: LV SV MOD A2C:   102.3 ml LV SV MOD A4C:   115.0  ml LV SV MOD BP:    86.3 ml RIGHT VENTRICLE             IVC RV S prime:     17.00 cm/s  IVC diam: 2.70 cm TAPSE (M-mode): 3.0 cm LEFT ATRIUM             Index       RIGHT ATRIUM           Index LA diam:        3.70 cm 1.69 cm/m  RA Area:     17.60 cm LA Vol (A2C):   63.9 ml 29.14 ml/m RA Volume:   47.40 ml  21.62 ml/m LA Vol (A4C):   56.0 ml 25.54 ml/m LA Biplane Vol: 65.2 ml 29.73 ml/m  AORTIC VALVE LVOT Vmax:   167.00 cm/s LVOT Vmean:  107.000 cm/s LVOT VTI:    0.365 m  AORTA Ao Root diam: 3.90 cm Ao Asc diam:  4.10 cm MITRAL VALVE MV Area (PHT): 3.99 cm     SHUNTS MV Decel Time: 190 msec     Systemic VTI:  0.36 m MV E velocity: 135.00 cm/s  Systemic Diam: 2.20 cm MV A velocity: 116.00 cm/s MV E/A ratio:  1.16 Gwyndolyn Kaufman MD Electronically signed by Gwyndolyn Kaufman MD Signature Date/Time: 01/07/2021/1:08:58 PM    Final        Past Medical History:  Diagnosis Date  . Allergy   . Arthritis   . Hx of colonic polyp 11/13/2016   10/2016 7 mm ssp/a - recall 2023  . Hypertension   . Hypothyroidism   . Lymphocytic colitis 04/19/2019  . Mild CAD 2009  . Obesity   . Paroxysmal atrial flutter ([place])   . Prediabetes   . Recurrent upper respiratory infection (URI)     Past Surgical History:  Procedure Laterality Date  . COLONOSCOPY  2018  . CORONARY ANGIOGRAM  2009    Family History  Problem  Relation Age of Onset  . Asthma Mother   . COPD Mother   . Cancer Mother        Skin  . Cancer - Other Mother        bone, liver, breast, lung  . Emphysema Mother   . Heart disease Father        Pacemaker  . Parkinson's disease Father   . Depression Father   . Depression Daughter   . OCD Daughter   . Alcohol abuse Maternal Grandmother   . Schizophrenia Maternal Grandmother   . Diabetes Paternal Grandfather   . Kidney disease Paternal Grandfather   . Colon polyps Neg Hx   . Colon cancer Neg Hx   . Esophageal cancer Neg Hx   . Stomach cancer Neg Hx   . Rectal cancer Neg Hx     Social History   Socioeconomic History  . Marital status: Married    Spouse name: Not on file  . Number of children: 2  . Years of education: Not on file  . Highest education level: Not on file  Occupational History  . Occupation: truck Publishing rights manager  Tobacco Use  . Smoking status: Current Every Day Smoker    Packs/day: 0.50    Years: 37.00    Pack years: 18.50    Types: Cigarettes  . Smokeless tobacco: Never Used  Vaping Use  . Vaping Use: Former  Substance and Sexual Activity  . Alcohol use: Yes    Alcohol/week: 12.0 standard drinks    Types: 12  Cans of beer per week  . Drug use: No  . Sexual activity: Not on file  Other Topics Concern  . 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     Social Determinants of Health   Financial Resource Strain: Not on file  Food Insecurity: Not on file  Transportation Needs: Not on file  Physical Activity: Not on file  Stress: Not on file  Social Connections: Not on file  Intimate Partner Violence: Not on file    Outpatient Medications Prior to Visit  Medication Sig Dispense Refill  . albuterol (VENTOLIN HFA) 108 (90 Base) MCG/ACT inhaler Inhale 2 puffs into the lungs every 4 (four) hours as needed for wheezing or shortness of breath. 6.7 g 0  . allopurinol (ZYLOPRIM) 300 MG tablet Take 1 tablet (300  mg total) by mouth daily. 90 tablet 1  . apixaban (ELIQUIS) 5 MG TABS tablet Take 1 tablet (5 mg total) by mouth 2 (two) times daily. 60 tablet 2  . busPIRone (BUSPAR) 15 MG tablet Take 1/3 tablet p.o. twice daily for 1 week, then take 2/3 tablet p.o. twice daily for 1 week, then take 1 tablet p.o. twice daily 60 tablet 2  . Cholecalciferol (VITAMIN D3) 5000 units TABS 5,000 IU OTC vitamin D3 daily. 90 tablet 3  . dicyclomine (BENTYL) 20 MG tablet TAKE 1 TABLET BY MOUTH 4 TIMES DAILY BEFORE MEAL(S) AND AT BEDTIME AS NEEDED (Patient taking differently: Take 20 mg by mouth 3 (three) times daily before meals.) 360 tablet 0  . diltiazem (CARDIZEM CD) 120 MG 24 hr capsule Take 1 capsule (120 mg total) by mouth daily. 30 capsule 2  . diphenhydrAMINE HCl, Sleep, 25 MG CAPS Take by mouth. Takes 1-2 tabs po QHS    . fexofenadine (ALLEGRA) 180 MG tablet Take 180 mg by mouth daily.    . fluticasone (FLONASE) 50 MCG/ACT nasal spray Place 2 sprays into the nose daily.    . fluticasone furoate-vilanterol (BREO ELLIPTA) 200-25 MCG/INH AEPB Inhale 1 puff into the lungs daily. 28 each 2  . hydrochlorothiazide (MICROZIDE) 12.5 MG capsule Take 1 capsule (12.5 mg total) by mouth daily. 90 capsule 1  . levothyroxine (SYNTHROID) 50 MCG tablet Take 1 tablet (50 mcg total) by mouth daily. 90 tablet 0  . losartan (COZAAR) 50 MG tablet Take 1 tablet (50 mg total) by mouth daily. 90 tablet 0  . Multiple Vitamins-Minerals (SUPER MEGA VITE 75/BETA CARO PO) Take by mouth.    . nicotine (NICODERM CQ - DOSED IN MG/24 HOURS) 21 mg/24hr patch Place 1 patch (21 mg total) onto the skin daily. 28 patch 0  . omega-3 acid ethyl esters (LOVAZA) 1 g capsule Take 2 capsules (2 g total) by mouth 2 (two) times daily. 180 capsule 3  . pantoprazole (PROTONIX) 40 MG tablet Take 1 tablet (40 mg total) by mouth 2 (two) times daily before a meal for 10 days, THEN 1 tablet (40 mg total) daily. 50 tablet 0  . Potassium 99 MG TABS Take 3-4 tablets by  mouth daily.    . predniSONE (STERAPRED UNI-PAK 21 TAB) 10 MG (21) TBPK tablet 3 tab orally daily x 2 days then 2 tab po daily x 2 days then 1 tab po daily x 2 days 12 tablet 0  . tamsulosin (FLOMAX) 0.4 MG CAPS capsule Take 1 capsule by mouth once daily 90 capsule 3  . benzonatate (TESSALON) 200 MG capsule Take 1 capsule (200  mg total) by mouth 3 (three) times daily as needed for cough. 30 capsule 0  . hydrocortisone (ANUSOL-HC) 2.5 % rectal cream Place 1 application rectally 2 (two) times daily. 30 g 0   No facility-administered medications prior to visit.    No Known Allergies  ROS Review of Systems A fourteen system review of systems was performed and found to be positive as per HPI.    Objective:    Physical Exam General:  Well Developed, in no acute distress, non-toxic appearing  Neuro:  Alert and oriented,  extra-ocular muscles intact  HEENT:  Normocephalic, atraumatic, neck supple Skin:  no gross rash, warm, pink. Cardiac:  RRR, S1 S2 wnl's, no murmur   Respiratory:  ECTA B/L w/ decreased breath sounds (L>R). Not using accessory muscles, speaking in full sentences- unlabored. Vascular:  Ext warm, no cyanosis apprec.; cap RF less 2 sec. Psych:  No HI/SI, judgement and insight good, Euthymic mood. Full Affect.   BP 116/73   Pulse 86   Temp 99.1 F (37.3 C)   Ht '5\' 7"'  (1.702 m)   Wt 249 lb 3.2 oz (113 kg)   SpO2 90%   BMI 39.03 kg/m  Wt Readings from Last 3 Encounters:  01/16/21 249 lb 3.2 oz (113 kg)  01/06/21 242 lb (109.8 kg)  12/18/20 247 lb 14.4 oz (112.4 kg)     Health Maintenance Due  Topic Date Due  . COVID-19 Vaccine (1) Never done  . TETANUS/TDAP  Never done    There are no preventive care reminders to display for this patient.  Lab Results  Component Value Date   TSH 2.972 01/06/2021   Lab Results  Component Value Date   WBC 8.4 01/10/2021   HGB 14.6 01/10/2021   HCT 45.6 01/10/2021   MCV 99.8 01/10/2021   PLT 155 01/10/2021   Lab  Results  Component Value Date   NA 138 01/10/2021   K 4.6 01/10/2021   CO2 38 (H) 01/10/2021   GLUCOSE 130 (H) 01/10/2021   BUN 21 (H) 01/10/2021   CREATININE 0.80 01/10/2021   BILITOT 0.9 01/06/2021   ALKPHOS 55 01/06/2021   AST 22 01/06/2021   ALT 23 01/06/2021   PROT 6.7 01/06/2021   ALBUMIN 3.8 01/06/2021   CALCIUM 9.2 01/10/2021   ANIONGAP 10 01/10/2021   Lab Results  Component Value Date   CHOL 191 01/10/2021   Lab Results  Component Value Date   HDL 66 01/10/2021   Lab Results  Component Value Date   LDLCALC 104 (H) 01/10/2021   Lab Results  Component Value Date   TRIG 105 01/10/2021   Lab Results  Component Value Date   CHOLHDL 2.9 01/10/2021   Lab Results  Component Value Date   HGBA1C 5.6 01/10/2021      Assessment & Plan:   Problem List Items Addressed This Visit      Cardiovascular and Mediastinum   Essential hypertension     Respiratory   Acute respiratory failure with hypoxia (HCC)     Endocrine   Hypothyroidism (Chronic)     Other   Sleeping difficulty   Relevant Orders   PSG Sleep Study   Hyperlipidemia    Other Visit Diagnoses    Hospital discharge follow-up    -  Primary   Relevant Orders   CBC w/Diff   Comp Met (CMET)   Magnesium   Paroxysmal atrial flutter ([place])       Relevant Orders   Ambulatory referral  to Cardiology   Community acquired pneumonia, unspecified laterality       Encounter for tobacco use cessation counseling         Hospital discharge follow-up, community-acquired pneumonia, acute respiratory failure with hypoxia: -Patient is gradually improving. -Blood pressure and pulse are within normal range. Oxygen saturation is 90%-patient is not wearing supplemental oxygen, currently denies dyspnea.  Recommend to continue to use supplemental oxygen especially if pulse ox <93 percent.  At discharge patient required 3 L of oxygen and level should be adjusted/decreased as oxygen saturation improves. Will reassess in  2 weeks. -Checked status of pulmonology referral, ready for initial scheduling. Provided patient office information for [facility]. Patient will benefit from PFTs to evaluate COPD status. Discussed chest CT should be repeated in 6 months and defer additional work-up/management to Pulmonology.  -Recommend to complete prednisone taper as directed, patient unsure how many days left of medication. Advised all other medications should be continued including Breo. -Will place order for sleep study. -Will collect CMP, CBC w/diff and magnesium. -Continue tobacco cessation.  Paroxysmal atrial flutter: -Advised to continue diltiazem and Eliquis. -Will place cardiology referral.  Essential hypertension: -Controlled. -Continue Losartan, HCTZ, Diltiazem.  -Will continue to monitor.   Hyperlipidemia: -Last lipid panel, triglycerides 218 and LDL 94 -Recommend to follow a heart healthy diet.  -Will continue to monitor.  Hypothyroidism: -Recent TSH normal. -Continue current medication regimen.  Encounter for tobacco use cessation counseling: -Smoking cessation instruction/counseling given:  commended patient for quitting and reviewed strategies for preventing relapses. -Continue Nicoderm therapy. Continue 21 mg x 6 weeks then will taper to 14 mg x 2 weeks and 7 mg x 2 weeks.  -Will reassess at follow up visit.    No orders of the defined types were placed in this encounter.   Follow-up: Return in about 2 weeks (around 01/30/2021) for HTN, COPD.    [name], PA-C

## 2021-01-16 NOTE — Patient Instructions (Signed)
[facility] [phone]   Chronic Obstructive Pulmonary Disease  Chronic obstructive pulmonary disease (COPD) is a long-term (chronic) lung problem. When you have COPD, it is hard for air to get in and out of your lungs. Usually the condition gets worse over time, and your lungs will never return to normal. There are things you can do to keep yourself as healthy as possible. What are the causes?  Smoking. This is the most common cause.  Certain genes passed from parent to child (inherited). What increases the risk?  Being exposed to secondhand smoke from cigarettes, pipes, or cigars.  Being exposed to chemicals and other irritants, such as fumes and dust in the work environment.  Having chronic lung conditions or infections. What are the signs or symptoms?  Shortness of breath, especially during physical activity.  A long-term cough with a large amount of thick mucus. Sometimes, the cough may not have any mucus (dry cough).  Wheezing.  Breathing quickly.  Skin that looks gray or blue, especially in the fingers, toes, or lips.  Feeling tired (fatigue).  Weight loss.  Chest tightness.  Having infections often.  Episodes when breathing symptoms become much worse (exacerbations). At the later stages of this disease, you may have swelling in the ankles, feet, or legs. How is this treated?  Taking medicines.  Quitting smoking, if you smoke.  Rehabilitation. This includes steps to make your body work better. It may involve a team of specialists.  Doing exercises.  Making changes to your diet.  Using oxygen.  Lung surgery.  Lung transplant.  Comfort measures (palliative care). Follow these instructions at home: Medicines  Take over-the-counter and prescription medicines only as told by your doctor.  Talk to your doctor before taking any cough or allergy medicines. You may need to avoid medicines that cause your lungs to be dry. Lifestyle  If you  smoke, stop smoking. Smoking makes the problem worse.  Do not smoke or use any products that contain nicotine or tobacco. If you need help quitting, ask your doctor.  Avoid being around things that make your breathing worse. This may include smoke, chemicals, and fumes.  Stay active, but remember to rest as well.  Learn and use tips on how to manage stress and control your breathing.  Make sure you get enough sleep. Most adults need at least 7 hours of sleep every night.  Eat healthy foods. Eat smaller meals more often. Rest before meals. Controlled breathing Learn and use tips on how to control your breathing as told by your doctor. Try:  Breathing in (inhaling) through your nose for 1 second. Then, pucker your lips and breath out (exhale) through your lips for 2 seconds.  Putting one hand on your belly (abdomen). Breathe in slowly through your nose for 1 second. Your hand on your belly should move out. Pucker your lips and breathe out slowly through your lips. Your hand on your belly should move in as you breathe out.   Controlled coughing Learn and use controlled coughing to clear mucus from your lungs. Follow these steps: 1. Lean your head a little forward. 2. Breathe in deeply. 3. Try to hold your breath for 3 seconds. 4. Keep your mouth slightly open while coughing 2 times. 5. Spit any mucus out into a tissue. 6. Rest and do the steps again 1 or 2 times as needed. General instructions  Make sure you get all the shots (vaccines) that your doctor recommends. Ask your doctor about a  flu shot and a pneumonia shot.  Use oxygen therapy and pulmonary rehabilitation if told by your doctor. If you need home oxygen therapy, ask your doctor if you should buy a tool to measure your oxygen level (oximeter).  Make a COPD action plan with your doctor. This helps you to know what to do if you feel worse than usual.  Manage any other conditions you have as told by your doctor.  Avoid going  outside when it is very hot, cold, or humid.  Avoid people who have a sickness you can catch (contagious).  Keep all follow-up visits. Contact a doctor if:  You cough up more mucus than usual.  There is a change in the color or thickness of the mucus.  It is harder to breathe than usual.  Your breathing is faster than usual.  You have trouble sleeping.  You need to use your medicines more often than usual.  You have trouble doing your normal activities such as getting dressed or walking around the house. Get help right away if:  You have shortness of breath while resting.  You have shortness of breath that stops you from: ? Being able to talk. ? Doing normal activities.  Your chest hurts for longer than 5 minutes.  Your skin color is more blue than usual.  Your pulse oximeter shows that you have low oxygen for longer than 5 minutes.  You have a fever.  You feel too tired to breathe normally. These symptoms may represent a serious problem that is an emergency. Do not wait to see if the symptoms will go away. Get medical help right away. Call your local emergency services (911 in the [place] Do not drive yourself to the hospital. Summary  Chronic obstructive pulmonary disease (COPD) is a long-term lung problem.  The way your lungs work will never return to normal. Usually the condition gets worse over time. There are things you can do to keep yourself as healthy as possible.  Take over-the-counter and prescription medicines only as told by your doctor.  If you smoke, stop. Smoking makes the problem worse. This information is not intended to replace advice given to you by your health care provider. Make sure you discuss any questions you have with your health care provider. Document Revised: 07/25/2020 Document Reviewed: 07/25/2020 Elsevier Patient Education  2021 [employer].

## 2021-01-17 LAB — COMPREHENSIVE METABOLIC PANEL
ALT: 31 IU/L (ref 0–44)
AST: 17 IU/L (ref 0–40)
Albumin/Globulin Ratio: 2.1 (ref 1.2–2.2)
Albumin: 4.2 g/dL (ref 3.8–4.9)
Alkaline Phosphatase: 62 IU/L (ref 44–121)
BUN/Creatinine Ratio: 22 — ABNORMAL HIGH (ref 9–20)
BUN: 19 mg/dL (ref 6–24)
Bilirubin Total: 0.2 mg/dL (ref 0.0–1.2)
CO2: 26 mmol/L (ref 20–29)
Calcium: 8.8 mg/dL (ref 8.7–10.2)
Chloride: 97 mmol/L (ref 96–106)
Creatinine, Ser: 0.85 mg/dL (ref 0.76–1.27)
Globulin, Total: 2 g/dL (ref 1.5–4.5)
Glucose: 138 mg/dL — ABNORMAL HIGH (ref 65–99)
Potassium: 4.4 mmol/L (ref 3.5–5.2)
Sodium: 140 mmol/L (ref 134–144)
Total Protein: 6.2 g/dL (ref 6.0–8.5)
eGFR: 102 mL/min/{1.73_m2} (ref >59)

## 2021-01-17 LAB — CBC WITH DIFFERENTIAL/PLATELET
Basophils Absolute: 0 10*3/uL (ref 0.0–0.2)
Basos: 0 %
EOS (ABSOLUTE): 0.1 10*3/uL (ref 0.0–0.4)
Eos: 1 %
Hematocrit: 43.9 % (ref 37.5–51.0)
Hemoglobin: 14.9 g/dL (ref 13.0–17.7)
Immature Grans (Abs): 0.2 10*3/uL — ABNORMAL HIGH (ref 0.0–0.1)
Immature Granulocytes: 2 %
Lymphocytes Absolute: 1.5 10*3/uL (ref 0.7–3.1)
Lymphs: 14 %
MCH: 32.1 pg (ref 26.6–33.0)
MCHC: 33.9 g/dL (ref 31.5–35.7)
MCV: 95 fL (ref 79–97)
Monocytes Absolute: 0.7 10*3/uL (ref 0.1–0.9)
Monocytes: 6 %
Neutrophils Absolute: 8.4 10*3/uL — ABNORMAL HIGH (ref 1.4–7.0)
Neutrophils: 77 %
Platelets: 178 10*3/uL (ref 150–450)
RBC: 4.64 x10E6/uL (ref 4.14–5.80)
RDW: 11.9 % (ref 11.6–15.4)
WBC: 10.8 10*3/uL (ref 3.4–10.8)

## 2021-01-17 LAB — MAGNESIUM: Magnesium: 1.9 mg/dL (ref 1.6–2.3)

## 2021-01-18 ENCOUNTER — Encounter: Payer: Self-pay | Admitting: Physician Assistant

## 2021-01-19 ENCOUNTER — Encounter: Payer: Self-pay | Admitting: Physician Assistant

## 2021-01-19 ENCOUNTER — Telehealth: Payer: Self-pay | Admitting: Physician Assistant

## 2021-01-19 NOTE — Telephone Encounter (Signed)
Patient dropped off FMLA paperwork off at the clinic.  Once completed, please fax to [phone].  I put the paperwork in [employer] in basket.

## 2021-01-19 NOTE — Telephone Encounter (Signed)
Forms have been completed. Left msg for patient to call back. Forms placed at front desk in accordion file. AS, CMA

## 2021-01-22 ENCOUNTER — Other Ambulatory Visit: Payer: Self-pay | Admitting: Physician Assistant

## 2021-01-22 ENCOUNTER — Encounter: Payer: Self-pay | Admitting: Physician Assistant

## 2021-01-22 DIAGNOSIS — R0602 Shortness of breath: Secondary | ICD-10-CM

## 2021-01-22 DIAGNOSIS — R0683 Snoring: Secondary | ICD-10-CM

## 2021-01-25 ENCOUNTER — Ambulatory Visit: Payer: BC Managed Care – PPO | Admitting: Internal Medicine

## 2021-01-25 ENCOUNTER — Ambulatory Visit (INDEPENDENT_AMBULATORY_CARE_PROVIDER_SITE_OTHER): Payer: BC Managed Care – PPO | Admitting: Nurse Practitioner

## 2021-01-25 ENCOUNTER — Encounter: Payer: Self-pay | Admitting: Nurse Practitioner

## 2021-01-25 ENCOUNTER — Encounter: Payer: Self-pay | Admitting: Internal Medicine

## 2021-01-25 ENCOUNTER — Other Ambulatory Visit: Payer: Self-pay

## 2021-01-25 VITALS — BP 117/69 | HR 80 | Temp 97.8°F | Ht 67.5 in | Wt 252.5 lb

## 2021-01-25 VITALS — BP 124/84 | HR 88 | Temp 97.0°F | Ht 67.5 in | Wt 253.4 lb

## 2021-01-25 DIAGNOSIS — Z87891 Personal history of nicotine dependence: Secondary | ICD-10-CM | POA: Diagnosis not present

## 2021-01-25 DIAGNOSIS — I10 Essential (primary) hypertension: Secondary | ICD-10-CM

## 2021-01-25 DIAGNOSIS — Z87898 Personal history of other specified conditions: Secondary | ICD-10-CM

## 2021-01-25 DIAGNOSIS — M25562 Pain in left knee: Secondary | ICD-10-CM

## 2021-01-25 DIAGNOSIS — R918 Other nonspecific abnormal finding of lung field: Secondary | ICD-10-CM

## 2021-01-25 DIAGNOSIS — R590 Localized enlarged lymph nodes: Secondary | ICD-10-CM

## 2021-01-25 DIAGNOSIS — I2510 Atherosclerotic heart disease of native coronary artery without angina pectoris: Secondary | ICD-10-CM

## 2021-01-25 DIAGNOSIS — Z8701 Personal history of pneumonia (recurrent): Secondary | ICD-10-CM

## 2021-01-25 MED ORDER — PREDNISONE 10 MG (21) PO TBPK: ORAL_TABLET | ORAL | 0 refills | Status: DC

## 2021-01-25 MED ORDER — TRAMADOL HCL 50 MG PO TABS: 50.0000 mg | ORAL_TABLET | Freq: Three times a day (TID) | ORAL | 0 refills | Status: DC | PRN

## 2021-01-25 NOTE — Patient Instructions (Signed)
Acute Knee Pain, Adult Many things can cause knee pain. Sometimes, knee pain is sudden (acute) and may be caused by damage, swelling, or irritation of the muscles and tissues that support your knee. The pain often goes away on its own with time and rest. If the pain does not go away, tests may be done to find out what is causing the pain. Follow these instructions at home: If you have a knee sleeve or brace:  Wear the knee sleeve or brace as told by your doctor. Take it off only as told by your doctor.  Loosen it if your toes: ? Tingle. ? Become numb. ? Turn cold and blue.  Keep it clean.  If the knee sleeve or brace is not waterproof: ? Do not let it get wet. ? Cover it with a watertight covering when you take a bath or shower.   Activity  Rest your knee.  Do not do things that cause pain or make pain worse.  Avoid activities where both feet leave the ground at the same time (high-impact activities). Examples are running, jumping rope, and doing jumping jacks.  Work with a physical therapist to make a safe exercise program, as told by your doctor. Managing pain, stiffness, and swelling  If told, put ice on the knee. To do this: ? If you have a removable knee sleeve or brace, take it off as told by your doctor. ? Put ice in a plastic bag. ? Place a towel between your skin and the bag. ? Leave the ice on for 20 minutes, 2-3 times a day. ? Take off the ice if your skin turns bright red. This is very important. If you cannot feel pain, heat, or cold, you have a greater risk of damage to the area.  If told, use an elastic bandage to put pressure (compression) on your injured knee.  Raise your knee above the level of your heart while you are sitting or lying down.  Sleep with a pillow under your knee.   General instructions  Take over-the-counter and prescription medicines only as told by your doctor.  Do not smoke or use any products that contain nicotine or tobacco. If you  need help quitting, ask your doctor.  If you are overweight, work with your doctor and a food expert (dietitian) to set goals to lose weight. Being overweight can make your knee hurt more.  Watch for any changes in your symptoms.  Keep all follow-up visits. Contact a doctor if:  The knee pain does not stop.  The knee pain changes or gets worse.  You have a fever along with knee pain.  Your knee is red or feels warm when you touch it.  Your knee gives out or locks up. Get help right away if:  Your knee swells, and the swelling gets worse.  You cannot move your knee.  You have very bad knee pain that does not get better with pain medicine. Summary  Many things can cause knee pain. The pain often goes away on its own with time and rest.  Your doctor may do tests to find out the cause of the pain.  Watch for any changes in your symptoms. Relieve your pain with rest, medicines, light activity, and use of ice.  Get help right away if you cannot move your knee or your knee pain is very bad. This information is not intended to replace advice given to you by your health care provider. Make sure you discuss   any questions you have with your health care provider. Document Revised: 03/01/2020 Document Reviewed: 03/01/2020 Elsevier Patient Education  2021 [employer]  

## 2021-01-25 NOTE — Addendum Note (Signed)
Addended byCoralie Keens on: 01/25/2021 10:06 AM   Modules accepted: Orders

## 2021-01-25 NOTE — Patient Instructions (Addendum)
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 Golden Hurter or Dr.  Brett Fairy or Dr. Nehemiah Settle in town [place] who can see you in the next few weeks]  FMLA   - You need to be out of work for the next 10 weeks till you go through the work-up and healing process  Plan  - fill out form by our staff  Follow-up - Return to see nurse practitioner to follow-up on test results in June 2022 but after completing the above pulmonary function test and CT scan [

## 2021-01-25 NOTE — Progress Notes (Signed)
OV 01/25/2021  Subjective:  Patient ID: Ralph Mack, male , DOB: 03-04-1966 , age 55 y.o. , MRN: [id number] , ADDRESS: [address] [place] [id number] PCP [name], PA-C Patient Care Team: [name], PA-C as PCP - General Minus Breeding, MD as PCP - Cardiology (Cardiology) Melida Quitter, MD as Consulting Physician (Otolaryngology) Kennith Gain, MD as Consulting Physician (Allergy) Gatha Mayer, MD as Consulting Physician (Gastroenterology) Cottle, Billey Co., MD as Attending Physician (Psychiatry)  This Provider for this visit: Treatment Team:  Attending Provider: Brand Males, MD    01/25/2021 -   Chief Complaint  Patient presents with  . Consult    SOB and wheezing, getting better slowly     HPI Ralph Mack 55 y.o. -new consultation.  He is a Administrator.  Former smoker.  He quit after his hospitalization.  History is gained from review of the chart and talking to him.  He also suffers from obesity, hypothyroidism, dyslipidemia, hypertension.  He was hospitalized on January 06, 2021.  At that time apparently was having fatigue for the last few to several weeks prior to the admission.  He was also noticed that he fell asleep on the steering wheel of his truck 1-2 times.  He drives around 5 miles a day.  At the time of hospitalization he was found to be hypoxemic with a left lower lobe pneumonia.  Pulmonary embolism was ruled out.  Incidental findings include subcarinal lymphadenopathy 1.5 cm along with other nodes.  And also 8 mm nodule along with another nodule.  He was found to be in paroxysmal atrial fibrillation.  Discharged on Eliquis.  He was also discharged on oxygen which he is now only using at night.  He says he does not need it at daytime.  In fact when we walked him 185 feet x 3 laps on room air he did not desaturate.  Overall he is feeling better.  He is very worried about his CT scan chest findings that I personally  visualized with him and documented below.  He is worried about cancer.  In addition he is worried about sleep apnea risk.  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  He is asking for FMLA form to be filled out.  Since discharge he also strained his left knee and has a knee brace. Noted with smoking history is concerned about presence of COPD.  SYMPTOM SCALE  01/25/2021   O2 use ra  Shortness of Breath 0 -> 5 scale with 5 being worst (score 6 If unable to do)  At rest 0  Simple tasks - showers, clothes change, eating, shaving 1  Household (dishes, doing bed, laundry) 1  Shopping 1  Walking level at own pace 1  Walking up Stairs 1  Total (30-36) Dyspnea Score 5  How bad is your cough? 0  How bad is your fatigue 1  How bad is nausea 0  How bad is vomiting?  00  How bad is diarrhea? 0  How bad is anxiety? 0  How bad is depression 0        Current walking desaturation test: Sitting pulse ox 93% heart rate 88/min.  Final pulse ox 92% after 3 laps.  Final heart rate 100/min.  Average pace no shortness of breath.    CT Chest data 01/06/21   Narrative & Impression  CLINICAL DATA:  Shortness of breath  EXAM: CT ANGIOGRAPHY CHEST WITH CONTRAST  TECHNIQUE:  Multidetector CT imaging of the chest was performed using the standard protocol during bolus administration of intravenous contrast. Multiplanar CT image reconstructions and MIPs were obtained to evaluate the vascular anatomy.  CONTRAST:  79mL OMNIPAQUE IOHEXOL 350 MG/ML SOLN  COMPARISON:  January 06, 2021  FINDINGS: Cardiovascular: There is no demonstrable pulmonary embolus. There is no evident thoracic aortic aneurysm or dissection. Visualized great vessels appear unremarkable. There are foci coronary artery calcification. There is no pericardial effusion or pericardial thickening.  Mediastinum/Nodes: Thyroid appears unremarkable. There is a lymph node in the aortopulmonary window region measuring  1.4 x 1.3 cm. There is a lymph node to the right of the distal trachea measuring 1.3 x 1.1 cm. There is a subcarinal lymph node measuring 1.5 x 1.4 cm. A lymph node in the right hilum measures 1.0 x 1.0 cm. Several subcentimeter lymph nodes also noted in the mediastinum. No esophageal lesions are appreciable.  Lungs/Pleura: There are areas of patchy airspace opacity in the left lower lobe and inferior lingula. On axial slice 72 series 5, there is an 8 x 7 mm nodular opacity in the posterior segment of the left upper lobe near the major fissure. On axial slice 72 series 5, there is a 5 x 4 mm nodular opacity in the anterior segment of the right upper lobe. There is atelectatic change in the right lung base. Trachea and major bronchial structures appear normal. No pleural effusion. No pneumothorax.  Upper Abdomen: Visualized upper abdominal structures appear unremarkable.  Musculoskeletal: No blastic or lytic bone lesions evident. No chest wall lesions.  Review of the MIP images confirms the above findings.  IMPRESSION: 1. No demonstrable pulmonary embolus. No thoracic aortic aneurysm or dissection. There are foci of coronary artery calcification.  2. Areas of airspace opacity in the left lower lobe and inferior lingula consistent with pneumonia. Right base atelectasis.  3. 8 x 7 mm nodular opacity in the posterior segment left upper lobe near the major fissure. There is a 5 x 4 mm nodular opacity in the anterior segment of the right upper lobe. Non-contrast chest CT at 6-12 months is recommended. If the nodule is stable at time of repeat CT, then future CT at 18-24 months (from today's scan) is considered optional for low-risk patients, but is recommended for high-risk patients. This recommendation follows the consensus statement: Guidelines for Management of Incidental Pulmonary Nodules Detected on CT Images: From the Fleischner Society 2017; Radiology 2017;  284:228-243.  4. Several mildly enlarged lymph nodes of uncertain etiology. Lymph node prominence of this nature potentially could have reactive etiology given the areas of pneumonia. Neoplastic etiology cannot be excluded in this circumstance, however.   Electronically Signed   By: Lowella Grip III M.D.   On: 01/06/2021 11:59      ECHO 01/07/21   Sonographer:  Roseanna Rainbow RDCS  Referring Phys: [id number] [place]     Sonographer Comments: Patient is morbidly obese and Technically difficult  study due to poor echo windows. Image acquisition challenging due to  patient body habitus.  IMPRESSIONS    1. Left ventricular ejection fraction, by estimation, is 60 to 65%. Left  ventricular ejection fraction by 3D volume is 67 %. The left ventricle has  normal function. The left ventricle has no regional wall motion  abnormalities. There is mild-to-moderate  concentric left ventricular hypertrophy. Left ventricular diastolic  parameters are consistent with Grade II diastolic dysfunction  (pseudonormalization).  2. Right ventricular systolic function is normal. The right ventricular  size is normal.  3. The mitral valve is normal in structure. Trivial mitral valve  regurgitation. No evidence of mitral stenosis.  4. The aortic valve is tricuspid. Aortic valve regurgitation is not  visualized. No aortic stenosis is present.  5. Aortic dilatation noted. There is mild dilatation of the ascending  aorta, measuring 41 mm.  6. The inferior vena cava is dilated in size with <50% respiratory  variability, suggesting right atrial pressure of 15 mmHg.   Comparison(s): No prior Echocardiogram.   No flowsheet data found.     has a past medical history of Allergy, Arthritis, colonic polyp (11/13/2016), Hypertension, Hypothyroidism, Lymphocytic colitis (04/19/2019), Mild CAD (2009), Obesity, Paroxysmal atrial flutter ([place]), Prediabetes, and Recurrent upper respiratory  infection (URI).   reports that he has been smoking cigarettes. He has a 18.50 pack-year smoking history. He has never used smokeless tobacco.  Past Surgical History:  Procedure Laterality Date  . COLONOSCOPY  2018  . CORONARY ANGIOGRAM  2009    No Known Allergies  Immunization History  Administered Date(s) Administered  . Influenza,inj,Quad PF,6+ Mos 07/26/2015    Family History  Problem Relation Age of Onset  . Asthma Mother   . COPD Mother   . Cancer Mother        Skin  . Cancer - Other Mother        bone, liver, breast, lung  . Emphysema Mother   . Heart disease Father        Pacemaker  . Parkinson's disease Father   . Depression Father   . Depression Daughter   . OCD Daughter   . Alcohol abuse Maternal Grandmother   . Schizophrenia Maternal Grandmother   . Diabetes Paternal Grandfather   . Kidney disease Paternal Grandfather   . Colon polyps Neg Hx   . Colon cancer Neg Hx   . Esophageal cancer Neg Hx   . Stomach cancer Neg Hx   . Rectal cancer Neg Hx      Current Outpatient Medications:  .  albuterol (VENTOLIN HFA) 108 (90 Base) MCG/ACT inhaler, Inhale 2 puffs into the lungs every 4 (four) hours as needed for wheezing or shortness of breath., Disp: 6.7 g, Rfl: 0 .  allopurinol (ZYLOPRIM) 300 MG tablet, Take 1 tablet (300 mg total) by mouth daily., Disp: 90 tablet, Rfl: 1 .  apixaban (ELIQUIS) 5 MG TABS tablet, Take 1 tablet (5 mg total) by mouth 2 (two) times daily., Disp: 60 tablet, Rfl: 2 .  busPIRone (BUSPAR) 15 MG tablet, Take 1/3 tablet p.o. twice daily for 1 week, then take 2/3 tablet p.o. twice daily for 1 week, then take 1 tablet p.o. twice daily, Disp: 60 tablet, Rfl: 2 .  Cholecalciferol (VITAMIN D3) 5000 units TABS, 5,000 IU OTC vitamin D3 daily., Disp: 90 tablet, Rfl: 3 .  dicyclomine (BENTYL) 20 MG tablet, TAKE 1 TABLET BY MOUTH 4 TIMES DAILY BEFORE MEAL(S) AND AT BEDTIME AS NEEDED (Patient taking differently: Take 20 mg by mouth 3 (three) times  daily before meals.), Disp: 360 tablet, Rfl: 0 .  diltiazem (CARDIZEM CD) 120 MG 24 hr capsule, Take 1 capsule (120 mg total) by mouth daily., Disp: 30 capsule, Rfl: 2 .  diphenhydrAMINE HCl, Sleep, 25 MG CAPS, Take by mouth. Takes 1-2 tabs po QHS, Disp: , Rfl:  .  fexofenadine (ALLEGRA) 180 MG tablet, Take 180 mg by mouth daily., Disp: , Rfl:  .  fluticasone (FLONASE) 50 MCG/ACT nasal spray, Place 2 sprays into the nose daily.,  Disp: , Rfl:  .  fluticasone furoate-vilanterol (BREO ELLIPTA) 200-25 MCG/INH AEPB, Inhale 1 puff into the lungs daily., Disp: 28 each, Rfl: 2 .  hydrochlorothiazide (MICROZIDE) 12.5 MG capsule, Take 1 capsule (12.5 mg total) by mouth daily., Disp: 90 capsule, Rfl: 1 .  levothyroxine (SYNTHROID) 50 MCG tablet, Take 1 tablet (50 mcg total) by mouth daily., Disp: 90 tablet, Rfl: 0 .  losartan (COZAAR) 50 MG tablet, Take 1 tablet (50 mg total) by mouth daily., Disp: 90 tablet, Rfl: 0 .  Multiple Vitamins-Minerals (SUPER MEGA VITE 75/BETA CARO PO), Take by mouth., Disp: , Rfl:  .  nicotine (NICODERM CQ - DOSED IN MG/24 HOURS) 21 mg/24hr patch, Place 1 patch (21 mg total) onto the skin daily., Disp: 28 patch, Rfl: 0 .  omega-3 acid ethyl esters (LOVAZA) 1 g capsule, Take 2 capsules (2 g total) by mouth 2 (two) times daily., Disp: 180 capsule, Rfl: 3 .  pantoprazole (PROTONIX) 40 MG tablet, Take 1 tablet (40 mg total) by mouth 2 (two) times daily before a meal for 10 days, THEN 1 tablet (40 mg total) daily., Disp: 50 tablet, Rfl: 0 .  Potassium 99 MG TABS, Take 3-4 tablets by mouth daily., Disp: , Rfl:  .  tamsulosin (FLOMAX) 0.4 MG CAPS capsule, Take 1 capsule by mouth once daily, Disp: 90 capsule, Rfl: 3      Objective:   Vitals:   01/25/21 0904  BP: 124/84  Pulse: 88  Temp: (!) 97 F (36.1 C)  TempSrc: Oral  SpO2: 93%  Weight: 253 lb 6.4 oz (114.9 kg)  Height: 5' 7.5" (1.715 m)    Estimated body mass index is 39.1 kg/m as calculated from the following:   Height  as of this encounter: 5' 7.5" (1.715 m).   Weight as of this encounter: 253 lb 6.4 oz (114.9 kg).  @WEIGHTCHANGE @  [employer]   01/25/21 0904  Weight: 253 lb 6.4 oz (114.9 kg)     Physical Exam  General Appearance:    Alert, cooperative, no distress, appears stated age - older , Deconditioned looking - yes mild , OBESE  - yes, Sitting on Wheelchair -  no  Head:    Normocephalic, without obvious abnormality, atraumatic  Eyes:    PERRL, conjunctiva/corneas clear,  Ears:    Normal TM's and external ear canals, both ears  Nose:   Nares normal, septum midline, mucosa normal, no drainage    or sinus tenderness. OXYGEN ON  - no . Patient is @ ra   Throat:   Lips, mucosa, and tongue normal; teeth and gums normal. Cyanosis on lips - no  Neck:   Supple, symmetrical, trachea midline, no adenopathy;    thyroid:  no enlargement/tenderness/nodules; no carotid   bruit or JVD  Back:     Symmetric, no curvature, ROM normal, no CVA tenderness  Lungs:     Distress - n , Wheeze no, Barrell Chest - no, Purse lip breathing - no, Crackles - no   Chest Wall:    No tenderness or deformity.    Heart:    Regular rate and rhythm, S1 and S2 normal, no rub   or gallop, Murmur - no  Breast Exam:    NOT DONE  Abdomen:     Soft, non-tender, bowel sounds active all four quadrants,    no masses, no organomegaly. Visceral obesity - yes  Genitalia:   NOT DONE  Rectal:   NOT DONE  Extremities:   Extremities -  normal, Has Cane - no, Clubbing - no, Edema - no  Pulses:   2+ and symmetric all extremities  Skin:   Stigmata of Connective Tissue Disease - no  Lymph nodes:   Cervical, supraclavicular, and axillary nodes normal  Psychiatric:  Neurologic:   Pleasant - yes, Anxious - no, Flat affect - no  CAm-ICU - neg, Alert and Oriented x 3 - yes, Moves all 4s - yes, Speech - normal, Cognition - intact         Assessment:       ICD-10-CM   1. History of recent pneumonia  Z87.01   2. Multiple lung nodules on CT   R91.8   3. Mediastinal adenopathy  R59.0   4. Stopped smoking with greater than 15 pack year history  Z87.891   5. History of snoring  Z87.898        Plan:     Patient Instructions  History of recent pneumonia- 01/06/21 Multiple lung nodules on CT - largest 75mm Left upper lobe 01/06/21 Mediastinal adenopathy - largest 1.5cm subcarina 14/9/22 - likely due to pneumonia Stopped smoking with greater than 15 pack year history  -All these findings are likely reactive due to pneumonia but given smoking history we need to have a close follow-up -Glad he quit smoking  Plan - Do CT scan chest with contrast sometime on or after March 08, 2021 [40-month follow-up] -Do full pulmonary function test in 2 months on after March 08, 2021    History of snoring -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 Golden Hurter or Dr.  Brett Fairy or Dr. Nehemiah Settle in town [place] who can see you in the next few weeks]  FMLA   - You need to be out of work for the next 10 weeks till you go through the work-up and healing process  Follow-up - Return to see nurse practitioner to follow-up on test results in June 2022 but after completing the above pulmonary function test and CT scan [  ( Level 05 visit: * New 60-74 min   in  visit type: on-site physical face to visit  in total care time and counseling or/and coordination of care by this undersigned MD - Dr Brand Males. This includes one or more of the following on this same day 01/25/2021: pre-charting, chart review, note writing, documentation discussion of test results, diagnostic or treatment recommendations, prognosis, risks and benefits of management options, instructions, education, compliance or risk-factor reduction. It excludes time spent by the [place] or office staff in the care of the patient. Actual time 78 min)    SIGNATURE    Dr. Brand Males, M.D., F.C.C.P,  Pulmonary and  Critical Care Medicine Staff Physician, [place] Director - Interstitial Lung Disease  Program  Pulmonary [facility] [place], [place], [address]  Pager: [phone], If no answer or between  15:00h - 7:00h: call [phone] Telephone: [phone]  9:52 AM 01/25/2021

## 2021-01-25 NOTE — Progress Notes (Signed)
Established Patient Office Visit  Subjective:  Patient ID: Ralph Mack, male    DOB: [birth year]  Age: 55 y.o. MRN: [id number]  CC:  Chief Complaint  Patient presents with  . Knee Pain    HPI Ralph Mack presents for evaluation of knee pain. States that on Monday, he was mowing his lawn on riding mower. Got lawnmower a bit sideways on hill and had to suddenly stop it from rolling on it's side. Braced the fall with his left leg. He states he hyperextended his left knee. Lawn mower hit up against the inner aspect of his left knee causing some bruising. He states that he has history of left knee injury from 20 years ago and feels like this just inflamed preexisting injury. He states that he has seen his chiropractor who did thorough assessment of his left hip and left knee. Was told that there was nothing broken and nothing torn. Bruised ligament pretty badly and would take some time to heal. Was advised to ice, rest, and elevate the knee when possible. Was also encouraged to use knee brace to give added support to the knee. He states that these measures have helped some. Knee feels decent when he is sitting still, but stiff very quickly. He states that he has taken tylenol to help and this really hasn't helped at all. He is unable to take NSAIDs due to anticoagulatation  Therapy. Patient states that his chiropractor is unable to prescribe medications to help him. The patient is wanting to do more to improve the pain. He does not want to do imaging studies at this time.   Past Medical History:  Diagnosis Date  . Allergy   . Arthritis   . Hx of colonic polyp 11/13/2016   10/2016 7 mm ssp/a - recall 2023  . Hypertension   . Hypothyroidism   . Lymphocytic colitis 04/19/2019  . Mild CAD 2009  . Obesity   . Paroxysmal atrial flutter ([place])   . Prediabetes   . Recurrent upper respiratory infection (URI)     Past Surgical History:  Procedure Laterality Date  . COLONOSCOPY  2018  . CORONARY  ANGIOGRAM  2009    Family History  Problem Relation Age of Onset  . Asthma Mother   . COPD Mother   . Cancer Mother        Skin  . Cancer - Other Mother        bone, liver, breast, lung  . Emphysema Mother   . Heart disease Father        Pacemaker  . Parkinson's disease Father   . Depression Father   . Depression Daughter   . OCD Daughter   . Alcohol abuse Maternal Grandmother   . Schizophrenia Maternal Grandmother   . Diabetes Paternal Grandfather   . Kidney disease Paternal Grandfather   . Colon polyps Neg Hx   . Colon cancer Neg Hx   . Esophageal cancer Neg Hx   . Stomach cancer Neg Hx   . Rectal cancer Neg Hx     Social History   Socioeconomic History  . Marital status: Married    Spouse name: Not on file  . Number of children: 2  . Years of education: Not on file  . Highest education level: Not on file  Occupational History  . Occupation: truck Publishing rights manager  Tobacco Use  . Smoking status: Current Every Day Smoker    Packs/day: 0.50    Years: 37.00    Pack years:  18.50    Types: Cigarettes  . Smokeless tobacco: Never Used  . Tobacco comment: not smoking currently-AH, 01/25/21  Vaping Use  . Vaping Use: Former  Substance and Sexual Activity  . Alcohol use: Yes    Alcohol/week: 12.0 standard drinks    Types: 12 Cans of beer per week  . Drug use: No  . Sexual activity: Not on file  Other Topics Concern  . 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     Social Determinants of Health   Financial Resource Strain: Not on file  Food Insecurity: Not on file  Transportation Needs: Not on file  Physical Activity: Not on file  Stress: Not on file  Social Connections: Not on file  Intimate Partner Violence: Not on file    Outpatient Medications Prior to Visit  Medication Sig Dispense Refill  . albuterol (VENTOLIN HFA) 108 (90 Base) MCG/ACT inhaler Inhale 2 puffs into the lungs every 4 (four) hours as needed  for wheezing or shortness of breath. 6.7 g 0  . allopurinol (ZYLOPRIM) 300 MG tablet Take 1 tablet (300 mg total) by mouth daily. 90 tablet 1  . apixaban (ELIQUIS) 5 MG TABS tablet Take 1 tablet (5 mg total) by mouth 2 (two) times daily. 60 tablet 2  . busPIRone (BUSPAR) 15 MG tablet Take 1/3 tablet p.o. twice daily for 1 week, then take 2/3 tablet p.o. twice daily for 1 week, then take 1 tablet p.o. twice daily 60 tablet 2  . Cholecalciferol (VITAMIN D3) 5000 units TABS 5,000 IU OTC vitamin D3 daily. 90 tablet 3  . dicyclomine (BENTYL) 20 MG tablet TAKE 1 TABLET BY MOUTH 4 TIMES DAILY BEFORE MEAL(S) AND AT BEDTIME AS NEEDED (Patient taking differently: Take 20 mg by mouth 3 (three) times daily before meals.) 360 tablet 0  . diltiazem (CARDIZEM CD) 120 MG 24 hr capsule Take 1 capsule (120 mg total) by mouth daily. 30 capsule 2  . diphenhydrAMINE HCl, Sleep, 25 MG CAPS Take by mouth. Takes 1-2 tabs po QHS    . fexofenadine (ALLEGRA) 180 MG tablet Take 180 mg by mouth daily.    . fluticasone (FLONASE) 50 MCG/ACT nasal spray Place 2 sprays into the nose daily.    . fluticasone furoate-vilanterol (BREO ELLIPTA) 200-25 MCG/INH AEPB Inhale 1 puff into the lungs daily. 28 each 2  . hydrochlorothiazide (MICROZIDE) 12.5 MG capsule Take 1 capsule (12.5 mg total) by mouth daily. (Patient not taking: Reported on 01/30/2021) 90 capsule 1  . levothyroxine (SYNTHROID) 50 MCG tablet Take 1 tablet (50 mcg total) by mouth daily. 90 tablet 0  . losartan (COZAAR) 50 MG tablet Take 1 tablet (50 mg total) by mouth daily. (Patient not taking: Reported on 01/30/2021) 90 tablet 0  . Multiple Vitamins-Minerals (SUPER MEGA VITE 75/BETA CARO PO) Take by mouth.    . nicotine (NICODERM CQ - DOSED IN MG/24 HOURS) 21 mg/24hr patch Place 1 patch (21 mg total) onto the skin daily. 28 patch 0  . omega-3 acid ethyl esters (LOVAZA) 1 g capsule Take 2 capsules (2 g total) by mouth 2 (two) times daily. 180 capsule 3  . pantoprazole  (PROTONIX) 40 MG tablet Take 1 tablet (40 mg total) by mouth 2 (two) times daily before a meal for 10 days, THEN 1 tablet (40 mg total) daily. 50 tablet 0  . Potassium 99 MG TABS Take 3-4 tablets by mouth daily.    . tamsulosin (FLOMAX) 0.4  MG CAPS capsule Take 1 capsule by mouth once daily 90 capsule 3   No facility-administered medications prior to visit.    No Known Allergies  ROS Review of Systems  Constitutional: Positive for activity change and fatigue. Negative for chills and fever.       Activity decline since pain in left knee so severe.   HENT: Negative for congestion and sinus pain.   Eyes: Negative.   Respiratory: Negative for cough and wheezing.   Cardiovascular: Negative for chest pain and palpitations.  Gastrointestinal: Negative for constipation.  Musculoskeletal: Positive for arthralgias, gait problem and joint swelling. Negative for back pain and myalgias.       Let knee pain which is most severe along the inner aspect. Some swelling present. Wearing a knee brace to help with stability.   Skin: Negative for rash.       Some bruising present where lawn mower impacted his knee.   Neurological: Negative for dizziness, weakness and headaches.  Psychiatric/Behavioral: The patient is not nervous/anxious.   All other systems reviewed and are negative.     Objective:    Physical Exam Vitals and nursing note reviewed.  Constitutional:      Appearance: Normal appearance. He is well-developed and well-groomed.     Comments: Patient appears to be in pain.   HENT:     Head: Normocephalic.     Nose: Nose normal.  Eyes:     Conjunctiva/sclera: Conjunctivae normal.     Pupils: Pupils are equal, round, and reactive to light.  Cardiovascular:     Rate and Rhythm: Normal rate and regular rhythm.     Pulses: Normal pulses.     Heart sounds: Normal heart sounds.  Pulmonary:     Effort: Pulmonary effort is normal.     Breath sounds: Normal breath sounds.  Abdominal:      Palpations: Abdomen is soft.     Tenderness: There is no abdominal tenderness.  Musculoskeletal:     Cervical back: Normal range of motion and neck supple.     Right knee: Normal.     Left knee: Erythema and ecchymosis present. Decreased range of motion. Tenderness present over the MCL and patellar tendon.     Comments: There is considerable tenderness with bruising present along the medial aspect of the left knee. ROM and strength are mildly reduced due to level of pain. No crepitus can be felt with flexion or extension of the knee. He is wearing a brace to add support and stability.   Skin:    General: Skin is warm and dry.  Neurological:     Mental Status: He is alert and oriented to person, place, and time.     Today's Vitals   01/25/21 1522  BP: 117/69  Pulse: 80  Temp: 97.8 F (36.6 C)  SpO2: 93%  Weight: 252 lb 8 oz (114.5 kg)  Height: 5' 7.5" (1.715 m)   Body mass index is 38.96 kg/m.   Wt Readings from Last 3 Encounters:  01/30/21 249 lb 1.6 oz (113 kg)  01/25/21 252 lb 8 oz (114.5 kg)  01/25/21 253 lb 6.4 oz (114.9 kg)     There are no preventive care reminders to display for this patient.  There are no preventive care reminders to display for this patient.  Lab Results  Component Value Date   TSH 2.972 01/06/2021   Lab Results  Component Value Date   WBC 10.8 01/16/2021   HGB 14.9 01/16/2021  HCT 43.9 01/16/2021   MCV 95 01/16/2021   PLT 178 01/16/2021   Lab Results  Component Value Date   NA 140 01/16/2021   K 4.4 01/16/2021   CO2 26 01/16/2021   GLUCOSE 138 (H) 01/16/2021   BUN 19 01/16/2021   CREATININE 0.85 01/16/2021   BILITOT 0.2 01/16/2021   ALKPHOS 62 01/16/2021   AST 17 01/16/2021   ALT 31 01/16/2021   PROT 6.2 01/16/2021   ALBUMIN 4.2 01/16/2021   CALCIUM 8.8 01/16/2021   ANIONGAP 10 01/10/2021   EGFR 102 01/16/2021   Lab Results  Component Value Date   CHOL 191 01/10/2021   Lab Results  Component Value Date   HDL 66  01/10/2021   Lab Results  Component Value Date   LDLCALC 104 (H) 01/10/2021   Lab Results  Component Value Date   TRIG 105 01/10/2021   Lab Results  Component Value Date   CHOLHDL 2.9 01/10/2021   Lab Results  Component Value Date   HGBA1C 5.6 01/10/2021      Assessment & Plan:  1. Acute pain of left knee Suspect injury to MCL. Patient declines imaging studies at this time. Will treat with prednisone taper. Take as directed for 6 days. May take tramadol 54m up to three times daily as needed for severe pain. Advised him to apply a compressive ACE bandage. Rest and elevate the affected painful area.  Apply cold compresses intermittently as needed.  As pain recedes, begin normal activities slowly as tolerated.  Call if symptoms persist, will get imaging studies and refer to orthopedics as indicated.  - predniSONE (STERAPRED UNI-PAK 21 TAB) 10 MG (21) TBPK tablet; 6 day taper - take by mouth as directed for 6 days  Dispense: 21 tablet; Refill: 0 - traMADol (ULTRAM) 50 MG tablet; Take 1 tablet (50 mg total) by mouth every 8 (eight) hours as needed.  Dispense: 15 tablet; Refill: 0  2. Coronary artery disease involving native coronary artery of native heart without angina pectoris Patient currently on eliquis and unable to take NSAIDS for pain. Treat with short prescription for tramadol. May take up to three times daily as needed for pain. Prescription for #15 tablets sent to his pharmacy.   3. Essential hypertension Stable. Continue bp medication as prescribed   Problem List Items Addressed This Visit      Cardiovascular and Mediastinum   Essential hypertension   CAD (coronary artery disease)     Other   Acute pain of left knee - Primary   Relevant Medications   predniSONE (STERAPRED UNI-PAK 21 TAB) 10 MG (21) TBPK tablet   traMADol (ULTRAM) 50 MG tablet      Meds ordered this encounter  Medications  . predniSONE (STERAPRED UNI-PAK 21 TAB) 10 MG (21) TBPK tablet    Sig: 6  day taper - take by mouth as directed for 6 days    Dispense:  21 tablet    Refill:  0    Order Specific Question:   Supervising Provider    Answer:   MBeatrice LecherD [[id number]]  . traMADol (ULTRAM) 50 MG tablet    Sig: Take 1 tablet (50 mg total) by mouth every 8 (eight) hours as needed.    Dispense:  15 tablet    Refill:  0    Order Specific Question:   Supervising Provider    Answer:   MBeatrice LecherD [[id number]]    Follow-up: Return for prn for worsening or persistent symptoms. .Marland Kitchen  Ronnell Freshwater, NP

## 2021-01-26 ENCOUNTER — Telehealth: Payer: Self-pay | Admitting: Internal Medicine

## 2021-01-26 NOTE — Telephone Encounter (Signed)
Faxed forms to [facility] at [phone]

## 2021-01-30 ENCOUNTER — Ambulatory Visit (INDEPENDENT_AMBULATORY_CARE_PROVIDER_SITE_OTHER): Payer: BC Managed Care – PPO | Admitting: Physician Assistant

## 2021-01-30 ENCOUNTER — Other Ambulatory Visit: Payer: Self-pay

## 2021-01-30 ENCOUNTER — Encounter: Payer: Self-pay | Admitting: Physician Assistant

## 2021-01-30 VITALS — BP 118/83 | HR 66 | Temp 98.2°F | Ht 67.0 in | Wt 249.1 lb

## 2021-01-30 DIAGNOSIS — I10 Essential (primary) hypertension: Secondary | ICD-10-CM

## 2021-01-30 DIAGNOSIS — I4892 Unspecified atrial flutter: Secondary | ICD-10-CM | POA: Diagnosis not present

## 2021-01-30 DIAGNOSIS — R0602 Shortness of breath: Secondary | ICD-10-CM

## 2021-01-30 DIAGNOSIS — Z0289 Encounter for other administrative examinations: Secondary | ICD-10-CM

## 2021-01-30 NOTE — Patient Instructions (Signed)

## 2021-01-30 NOTE — Telephone Encounter (Signed)
Sent email to Ralph Mack to drop charge - Called and l/m on pt vm to advise waiting for charge to be posted prior to collecting payment - and confirmed that form was faxed on Friday -pr

## 2021-01-31 NOTE — Telephone Encounter (Signed)
Called and left message on pt vm advising charge is on his account, and he can pay online -pr

## 2021-02-01 DIAGNOSIS — M25562 Pain in left knee: Secondary | ICD-10-CM | POA: Insufficient documentation

## 2021-02-02 ENCOUNTER — Telehealth: Payer: Self-pay | Admitting: Physician Assistant

## 2021-02-02 NOTE — Telephone Encounter (Signed)
Sending mychart msg to patient per Herb Grays advising patient per last AVS from hospital encounter he is to continue Losartan and HCTZ. AS, CMA

## 2021-02-02 NOTE — Progress Notes (Signed)
Established Patient Office Visit  Subjective:  Patient ID: Ralph Mack, male    DOB: 04-Aug-1966  Age: 55 y.o. MRN: [id number]  CC:  Chief Complaint  Patient presents with  . Follow-up    HPI Ralph Mack presents for follow up on hypertension and shortness of breath. Patient reports oxygen saturations are improving, range from 93-95%. Is using oxygen mostly at night. Patient has been evaluated by Pulmonology since last visit and pulmonologist believes lung nodules are likely reactive response to pneumonia and will repeat chest CT in 2 months. Patient has an appointment scheduled for sleep study. Continues tobacco cessation. Has an appointment with cardiology for next week. Denies chest pain, palpitations, dizziness, edmea or orthopnea. Shortness of breath is gradually improving. Reports unsure if suppose to be taking HCTZ and losartan, feels like meds were discontinued according to his discharge papers.   Past Medical History:  Diagnosis Date  . Allergy   . Arthritis   . Hx of colonic polyp 11/13/2016   10/2016 7 mm ssp/a - recall 2023  . Hypertension   . Hypothyroidism   . Lymphocytic colitis 04/19/2019  . Mild CAD 2009  . Obesity   . Paroxysmal atrial flutter ([place])   . Prediabetes   . Recurrent upper respiratory infection (URI)     Past Surgical History:  Procedure Laterality Date  . COLONOSCOPY  2018  . CORONARY ANGIOGRAM  2009    Family History  Problem Relation Age of Onset  . Asthma Mother   . COPD Mother   . Cancer Mother        Skin  . Cancer - Other Mother        bone, liver, breast, lung  . Emphysema Mother   . Heart disease Father        Pacemaker  . Parkinson's disease Father   . Depression Father   . Depression Daughter   . OCD Daughter   . Alcohol abuse Maternal Grandmother   . Schizophrenia Maternal Grandmother   . Diabetes Paternal Grandfather   . Kidney disease Paternal Grandfather   . Colon polyps Neg Hx   . Colon cancer Neg Hx   .  Esophageal cancer Neg Hx   . Stomach cancer Neg Hx   . Rectal cancer Neg Hx     Social History   Socioeconomic History  . Marital status: Married    Spouse name: Not on file  . Number of children: 2  . Years of education: Not on file  . Highest education level: Not on file  Occupational History  . Occupation: truck Publishing rights manager  Tobacco Use  . Smoking status: Current Every Day Smoker    Packs/day: 0.50    Years: 37.00    Pack years: 18.50    Types: Cigarettes  . Smokeless tobacco: Never Used  . Tobacco comment: not smoking currently-AH, 01/25/21  Vaping Use  . Vaping Use: Former  Substance and Sexual Activity  . Alcohol use: Yes    Alcohol/week: 12.0 standard drinks    Types: 12 Cans of beer per week  . Drug use: No  . Sexual activity: Not on file  Other Topics Concern  . 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     Social Determinants of Health   Financial Resource Strain: Not on file  Food Insecurity: Not on file  Transportation Needs: Not on file  Physical Activity: Not on file  Stress: Not on file  Social  Connections: Not on file  Intimate Partner Violence: Not on file    Outpatient Medications Prior to Visit  Medication Sig Dispense Refill  . albuterol (VENTOLIN HFA) 108 (90 Base) MCG/ACT inhaler Inhale 2 puffs into the lungs every 4 (four) hours as needed for wheezing or shortness of breath. 6.7 g 0  . allopurinol (ZYLOPRIM) 300 MG tablet Take 1 tablet (300 mg total) by mouth daily. 90 tablet 1  . apixaban (ELIQUIS) 5 MG TABS tablet Take 1 tablet (5 mg total) by mouth 2 (two) times daily. 60 tablet 2  . busPIRone (BUSPAR) 15 MG tablet Take 1/3 tablet p.o. twice daily for 1 week, then take 2/3 tablet p.o. twice daily for 1 week, then take 1 tablet p.o. twice daily 60 tablet 2  . Cholecalciferol (VITAMIN D3) 5000 units TABS 5,000 IU OTC vitamin D3 daily. 90 tablet 3  . dicyclomine (BENTYL) 20 MG tablet TAKE 1  TABLET BY MOUTH 4 TIMES DAILY BEFORE MEAL(S) AND AT BEDTIME AS NEEDED (Patient taking differently: Take 20 mg by mouth 3 (three) times daily before meals.) 360 tablet 0  . diltiazem (CARDIZEM CD) 120 MG 24 hr capsule Take 1 capsule (120 mg total) by mouth daily. 30 capsule 2  . diphenhydrAMINE HCl, Sleep, 25 MG CAPS Take by mouth. Takes 1-2 tabs po QHS    . fexofenadine (ALLEGRA) 180 MG tablet Take 180 mg by mouth daily.    . fluticasone (FLONASE) 50 MCG/ACT nasal spray Place 2 sprays into the nose daily.    . fluticasone furoate-vilanterol (BREO ELLIPTA) 200-25 MCG/INH AEPB Inhale 1 puff into the lungs daily. 28 each 2  . levothyroxine (SYNTHROID) 50 MCG tablet Take 1 tablet (50 mcg total) by mouth daily. 90 tablet 0  . Multiple Vitamins-Minerals (SUPER MEGA VITE 75/BETA CARO PO) Take by mouth.    . nicotine (NICODERM CQ - DOSED IN MG/24 HOURS) 21 mg/24hr patch Place 1 patch (21 mg total) onto the skin daily. 28 patch 0  . omega-3 acid ethyl esters (LOVAZA) 1 g capsule Take 2 capsules (2 g total) by mouth 2 (two) times daily. 180 capsule 3  . pantoprazole (PROTONIX) 40 MG tablet Take 1 tablet (40 mg total) by mouth 2 (two) times daily before a meal for 10 days, THEN 1 tablet (40 mg total) daily. 50 tablet 0  . Potassium 99 MG TABS Take 3-4 tablets by mouth daily.    . predniSONE (STERAPRED UNI-PAK 21 TAB) 10 MG (21) TBPK tablet 6 day taper - take by mouth as directed for 6 days 21 tablet 0  . tamsulosin (FLOMAX) 0.4 MG CAPS capsule Take 1 capsule by mouth once daily 90 capsule 3  . traMADol (ULTRAM) 50 MG tablet Take 1 tablet (50 mg total) by mouth every 8 (eight) hours as needed. 15 tablet 0  . hydrochlorothiazide (MICROZIDE) 12.5 MG capsule Take 1 capsule (12.5 mg total) by mouth daily. (Patient not taking: Reported on 01/30/2021) 90 capsule 1  . losartan (COZAAR) 50 MG tablet Take 1 tablet (50 mg total) by mouth daily. (Patient not taking: Reported on 01/30/2021) 90 tablet 0   No  facility-administered medications prior to visit.    No Known Allergies  ROS Review of Systems A fourteen system review of systems was performed and found to be positive as per HPI.   Objective:    Physical Exam General:  Well Developed, well nourished, in no acute distress Neuro:  Alert and oriented,  extra-ocular muscles intact  HEENT:  Normocephalic, atraumatic, neck supple  Skin:  no gross rash, warm, pink. Cardiac:  RRR, S1 S2 Respiratory:  ECTA B/L w/o wheezing, Not using accessory muscles, speaking in full sentences- unlabored. Vascular:  Ext warm, no cyanosis apprec.; cap RF less 2 sec. Psych:  No HI/SI, judgement and insight good, Euthymic mood. Full Affect.  BP 118/83   Pulse 66   Temp 98.2 F (36.8 C)   Ht _0  (1.702 m)   Wt 249 lb 1.6 oz (113 kg)   SpO2 97%   BMI 39.01 kg/m  Wt Readings from Last 3 Encounters:  01/30/21 249 lb 1.6 oz (113 kg)  01/25/21 252 lb 8 oz (114.5 kg)  01/25/21 253 lb 6.4 oz (114.9 kg)     There are no preventive care reminders to display for this patient.  There are no preventive care reminders to display for this patient.  Lab Results  Component Value Date   TSH 2.972 01/06/2021   Lab Results  Component Value Date   WBC 10.8 01/16/2021   HGB 14.9 01/16/2021   HCT 43.9 01/16/2021   MCV 95 01/16/2021   PLT 178 01/16/2021   Lab Results  Component Value Date   NA 140 01/16/2021   K 4.4 01/16/2021   CO2 26 01/16/2021   GLUCOSE 138 (H) 01/16/2021   BUN 19 01/16/2021   CREATININE 0.85 01/16/2021   BILITOT 0.2 01/16/2021   ALKPHOS 62 01/16/2021   AST 17 01/16/2021   ALT 31 01/16/2021   PROT 6.2 01/16/2021   ALBUMIN 4.2 01/16/2021   CALCIUM 8.8 01/16/2021   ANIONGAP 10 01/10/2021   EGFR 102 01/16/2021   Lab Results  Component Value Date   CHOL 191 01/10/2021   Lab Results  Component Value Date   HDL 66 01/10/2021   Lab Results  Component Value Date   LDLCALC 104 (H) 01/10/2021   Lab Results  Component  Value Date   TRIG 105 01/10/2021   Lab Results  Component Value Date   CHOLHDL 2.9 01/10/2021   Lab Results  Component Value Date   HGBA1C 5.6 01/10/2021      Assessment & Plan:   Problem List Items Addressed This Visit      Cardiovascular and Mediastinum   Essential hypertension - Primary   CAD (coronary artery disease)    Other Visit Diagnoses    Shortness of breath       Paroxysmal atrial flutter ([place])         Essential hypertension: -Controlled. -Discussed with patient med list was reviewed during hospital discharge follow up visit and advised to continue with HCTZ and Losartan. Per hospital discharge summary, medications were not discontinued. -Patient denies symptoms of edema and discussed HCTZ could possibly be discontinued if BP remains well controlled. 01/07/2021 echocardiogram shows LVEF 60-65%.  -Will continue to monitor.  PAF: -Follow up with cardiology as scheduled. -Continue diltiazem and apixaban.  Shortness of breath: -Improving. Oxygen saturation today is 97%, has improved from prior visit. -Recommend to follow up with Pulmonology as instructed. Patient will be having PFTs.  No orders of the defined types were placed in this encounter.   Follow-up: Return in about 6 weeks (around 03/13/2021) for HTN, COPD.    [name], PA-C

## 2021-02-07 NOTE — Progress Notes (Signed)
Cardiology Office Note   Date:  02/08/2021   ID:  Ralph Mack, DOB 05-Mar-1966, MRN [id number]  PCP:  [name], PA-C  Cardiologist:   Minus Breeding, MD   Chief Complaint  Patient presents with  . Atrial Flutter      History of Present Illness: Ralph Mack is a 55 y.o. male who presents for evaluation of HTN and minor coronary disease at catheterization in 2009 when he had a heart catheterization in [place].  He did have a negative treadmill in January 2020.    Since I last saw him hospitalized.  I reviewed these records.  He was admitted with acute respiratory failure.  He had pneumonia.  He was quite ill and hypoxemic.  Echocardiogram demonstrated no significant abnormality.  He was treated with steroids as well as antibiotics.  He did have paroxysmal atrial flutter.  He actually saw Dr. Marlou Porch who suggested being referred to EP to discuss a watchman.  I reviewed this note and part of this conversation was based on the patient's hesitancy to take long-term anticoagulation because of need for a DOT clearance.  He does drive.  Of note there was also discussion of his sleep apnea as well as significant alcohol use.  Particular since being off of work he has been drinking 6 beers a night.  He never had documented fibrillation that I can see.  He has not had flutter in the past.  I do see that his troponins were negative.  I saw that he was treated with some IV Lasix but his BNP was normal.  He says he is now back to baseline.  He is not describing any new palpitations, presyncope or syncope.  He has had no new chest discomfort, neck or arm discomfort.  Has had no weight gain or edema.  Past Medical History:  Diagnosis Date  . Allergy   . Arthritis   . Hx of colonic polyp 11/13/2016   10/2016 7 mm ssp/a - recall 2023  . Hypertension   . Hypothyroidism   . Lymphocytic colitis 04/19/2019  . Mild CAD 2009  . Obesity   . Paroxysmal atrial flutter ([place])   . Prediabetes   . Recurrent  upper respiratory infection (URI)     Past Surgical History:  Procedure Laterality Date  . COLONOSCOPY  2018  . CORONARY ANGIOGRAM  2009     Current Outpatient Medications  Medication Sig Dispense Refill  . albuterol (VENTOLIN HFA) 108 (90 Base) MCG/ACT inhaler Inhale 2 puffs into the lungs every 4 (four) hours as needed for wheezing or shortness of breath. 6.7 g 0  . allopurinol (ZYLOPRIM) 300 MG tablet Take 1 tablet (300 mg total) by mouth daily. 90 tablet 1  . apixaban (ELIQUIS) 5 MG TABS tablet Take 1 tablet (5 mg total) by mouth 2 (two) times daily. 60 tablet 2  . busPIRone (BUSPAR) 15 MG tablet Take 1/3 tablet p.o. twice daily for 1 week, then take 2/3 tablet p.o. twice daily for 1 week, then take 1 tablet p.o. twice daily 60 tablet 2  . Cholecalciferol (VITAMIN D3) 5000 units TABS 5,000 IU OTC vitamin D3 daily. 90 tablet 3  . diphenhydrAMINE HCl, Sleep, 25 MG CAPS Take by mouth. Takes 1-2 tabs po QHS    . fexofenadine (ALLEGRA) 180 MG tablet Take 180 mg by mouth daily.    . fluticasone (FLONASE) 50 MCG/ACT nasal spray Place 2 sprays into the nose daily.    . fluticasone furoate-vilanterol (BREO ELLIPTA)  200-25 MCG/INH AEPB Inhale 1 puff into the lungs daily. 28 each 2  . levothyroxine (SYNTHROID) 50 MCG tablet Take 1 tablet (50 mcg total) by mouth daily. 90 tablet 0  . Multiple Vitamins-Minerals (SUPER MEGA VITE 75/BETA CARO PO) Take by mouth.    . nicotine (NICODERM CQ - DOSED IN MG/24 HOURS) 21 mg/24hr patch Place 1 patch (21 mg total) onto the skin daily. 28 patch 0  . omega-3 acid ethyl esters (LOVAZA) 1 g capsule Take 2 capsules (2 g total) by mouth 2 (two) times daily. 180 capsule 3  . pantoprazole (PROTONIX) 40 MG tablet Take 1 tablet (40 mg total) by mouth 2 (two) times daily before a meal for 10 days, THEN 1 tablet (40 mg total) daily. 50 tablet 0  . Potassium 99 MG TABS Take 3-4 tablets by mouth daily.    . tadalafil (CIALIS) 5 MG tablet Take 1 tablet (5 mg total) by mouth  daily as needed for erectile dysfunction. 10 tablet 0  . traMADol (ULTRAM) 50 MG tablet Take 1 tablet (50 mg total) by mouth every 8 (eight) hours as needed. 15 tablet 0  . diltiazem (CARDIZEM CD) 120 MG 24 hr capsule Take 1 capsule (120 mg total) by mouth daily. 90 capsule 3   No current facility-administered medications for this visit.    Allergies:   Patient has no known allergies.    ROS:  Please see the history of present illness.   Otherwise, review of systems are positive for none.   All other systems are reviewed and negative.    PHYSICAL EXAM: VS:  BP 132/78   Pulse 65   Ht 5\' 7"  (1.702 m)   Wt 247 lb (112 kg)   SpO2 97%   BMI 38.69 kg/m  , BMI Body mass index is 38.69 kg/m. GENERAL:  Well appearing NECK:  No jugular venous distention, waveform within normal limits, carotid upstroke brisk and symmetric, no bruits, no thyromegaly LUNGS:  Clear to auscultation bilaterally CHEST:  Unremarkable HEART:  PMI not displaced or sustained,S1 and S2 within normal limits, no S3, no S4, no clicks, no rubs, no murmurs ABD:  Flat, positive bowel sounds normal in frequency in pitch, no bruits, no rebound, no guarding, no midline pulsatile mass, no hepatomegaly, no splenomegaly EXT:  2 plus pulses throughout, no edema, no cyanosis no clubbing   EKG:  EKG is ordered today. The ekg ordered today demonstrates sinus rhythm, rate 65, right axis deviation, intervals within normal limits, no acute ST-T wave changes.   Recent Labs: 01/06/2021: B Natriuretic Peptide 72.3; TSH 2.972 01/16/2021: ALT 31; BUN 19; Creatinine, Ser 0.85; Hemoglobin 14.9; Magnesium 1.9; Platelets 178; Potassium 4.4; Sodium 140    Lipid Panel    Component Value Date/Time   CHOL 191 01/10/2021 0450   CHOL 190 08/07/2020 1100   TRIG 105 01/10/2021 0450   HDL 66 01/10/2021 0450   HDL 59 08/07/2020 1100   CHOLHDL 2.9 01/10/2021 0450   VLDL 21 01/10/2021 0450   LDLCALC 104 (H) 01/10/2021 0450   LDLCALC 94 08/07/2020  1100      Wt Readings from Last 3 Encounters:  02/08/21 247 lb (112 kg)  01/30/21 249 lb 1.6 oz (113 kg)  01/25/21 252 lb 8 oz (114.5 kg)      Other studies Reviewed: Additional studies/ records that were reviewed today include: Extensive review of hospital records. Review of the above records demonstrates:  Please see elsewhere in the note.  ASSESSMENT AND PLAN:   ATRIAL FLUTTER: It looks like this was typical and isolated atrial flutter.  Ralph Mack has a CHA2DS2 - VASc score of 2.   However, I am not convinced that he would need anticoagulation forever.  Certainly think that some of his risk of arrhythmia was related to his acute illness as well as his alcohol sleep apnea.  He may not have recurrence of the flutter for some time and they would probably be be able to watch for this on a wearable.  We discussed that.  His consultation discussed the possibility of a Watchman but I actually do not think this should be the consideration.  Rather it would not be unreasonable, despite the isolated episode in the potentially modifiable risk of precipitators, to discuss ablation and he be interested in that.  I will leave him on the Eliquis until that time and we will have him see Dr. Quentin Ore.    HTN: The blood pressure is controlled.  No change in therapy.  CAD: He had mild coronary plaque.  There is no suggestion of obstruction particularly since he had negative troponins with his critical illness.  He needs continued risk reduction.  PULMONARY NODULES:   He should have follow-up with his primary provider who can take noted this as well.  If he has not had follow-up CT when I see him back I will arrange this.  ALCOHOL USE: We talked about the need to cut back his alcohol and how this could contribute to his arrhythmias.  ED: I did give him his prescription for Cialis.  TOBACCO ABUSE: He has not been smoking since his hospitalization.  Current medicines are reviewed at length  with the patient today.  The patient does not have concerns regarding medicines.  The following changes have been made:  no change  Labs/ tests ordered today include:   Orders Placed This Encounter  Procedures  . Ambulatory referral to Cardiac Electrophysiology  . EKG 12-Lead     Disposition:   FU with me in 12 months.    Signed, Minus Breeding, MD  02/08/2021 1:22 PM    [facility]

## 2021-02-08 ENCOUNTER — Ambulatory Visit: Payer: BC Managed Care – PPO | Admitting: Cardiology

## 2021-02-08 ENCOUNTER — Encounter: Payer: Self-pay | Admitting: Cardiology

## 2021-02-08 ENCOUNTER — Other Ambulatory Visit: Payer: Self-pay

## 2021-02-08 VITALS — BP 132/78 | HR 65 | Ht 67.0 in | Wt 247.0 lb

## 2021-02-08 DIAGNOSIS — I483 Typical atrial flutter: Secondary | ICD-10-CM | POA: Diagnosis not present

## 2021-02-08 DIAGNOSIS — I2510 Atherosclerotic heart disease of native coronary artery without angina pectoris: Secondary | ICD-10-CM | POA: Diagnosis not present

## 2021-02-08 DIAGNOSIS — I10 Essential (primary) hypertension: Secondary | ICD-10-CM | POA: Diagnosis not present

## 2021-02-08 DIAGNOSIS — I4892 Unspecified atrial flutter: Secondary | ICD-10-CM | POA: Diagnosis not present

## 2021-02-08 MED ORDER — TADALAFIL 5 MG PO TABS: 5.0000 mg | ORAL_TABLET | Freq: Every day | ORAL | 0 refills | Status: DC | PRN

## 2021-02-08 MED ORDER — DILTIAZEM HCL ER COATED BEADS 120 MG PO CP24: 120.0000 mg | ORAL_CAPSULE | Freq: Every day | ORAL | 3 refills | Status: DC

## 2021-02-08 NOTE — Patient Instructions (Signed)

## 2021-02-16 ENCOUNTER — Telehealth: Payer: Self-pay | Admitting: Cardiology

## 2021-02-16 NOTE — Telephone Encounter (Signed)
[facility] received forms from [place], forms were given to provider on 5/20.

## 2021-02-19 ENCOUNTER — Other Ambulatory Visit: Payer: Self-pay | Admitting: Physician Assistant

## 2021-02-19 ENCOUNTER — Telehealth: Payer: Self-pay | Admitting: Psychiatry

## 2021-02-19 ENCOUNTER — Telehealth: Payer: Self-pay | Admitting: Physician Assistant

## 2021-02-19 ENCOUNTER — Other Ambulatory Visit: Payer: Self-pay

## 2021-02-19 DIAGNOSIS — F411 Generalized anxiety disorder: Secondary | ICD-10-CM

## 2021-02-19 DIAGNOSIS — E039 Hypothyroidism, unspecified: Secondary | ICD-10-CM

## 2021-02-19 MED ORDER — BUSPIRONE HCL 15 MG PO TABS: ORAL_TABLET | ORAL | 1 refills | Status: DC

## 2021-02-19 NOTE — Telephone Encounter (Signed)
Called patient to advise FMLA forms faxed to our office will need to be filled out by the provider who took patient out of work. This was Pulmonology. AS, CMA

## 2021-02-19 NOTE — Telephone Encounter (Signed)
Rx refill sent per request

## 2021-02-19 NOTE — Progress Notes (Addendum)
02/20/21- 71 yoM Smoker for sleep evaluation courtesy of Dr Chase Caller Medical problem list includes AFlutter, Orthostasis, CAD, HTN, Chronic Bronchitis, Recurrent Sinusitis, Allergic Rhinitis,  Lymphocytic Colitis, Hypothyroid, Cervical Disc Disease, Obesity, Hypertriglyceridemia,  Meds include Ventolin HFA, Nicoderm CQ,  Hosp April for Acute Hypoxic Resp Failure, COPD exacerbation, Pneumonia RUL nodule, , Was discharged on O2 for sleep. Scheduled for PFT and f/u with Dr Chase Caller in June.  Epworth score- Body weight today- Covid vax-none Walk Test 02/20/21- desat to 85% during second lap with HR 90. Improved to 95% on 2L. Patient denied shortness of breath during walk. He notes daytime tiredness and has needed to pull over while driving. Hx snoring.  Nocturia x 4-5. Continues using O2 2l during sleep since [place] in April. Does have portable- not using. 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  Uses otc sleep med. 20 oz AM coffee.  Denies ENT surgery, but bothersome postnasal drip.Hx asthma, not feeling need now for inhalers.  Father died Parkinsons. Pt denies complex parasomnias.   Addendum 04/20/21- Called and spoke with patient, he states that he already has a machine on it's way to him through the company he works for [employer]).  He missed the delivery for yesterday, but should receive it sometime today.  The machine is called [employer] and it is provided free of charge by his employer since his deductible has already been met for this year.  He gave me a contact # for Cory Munch that he has been working with to get the machine.  She can be reached at [phone] ext:  [phone].  The fax # is [phone] and the e-mail address is [email or web address].  I let him know I would give her a call to see how we would go about getting the download information from the machine.   Called [place] and spoke with [facility], she advised that we can call and request a  download and they can fax it to [place].  I provided our fax # and that the fax should be to the attention of Dr. Annamaria Boots.  They have everything they need as far as setting, sleep study and history.   Prior to Admission medications   Medication Sig Start Date End Date Taking? Authorizing Provider  allopurinol (ZYLOPRIM) 300 MG tablet Take 1 tablet (300 mg total) by mouth daily. 08/11/20  Yes Abonza, Maritza, PA-C  apixaban (ELIQUIS) 5 MG TABS tablet Take 1 tablet (5 mg total) by mouth 2 (two) times daily. 01/10/21 01/10/22 Yes Pokhrel, Corrie Mckusick, MD  busPIRone (BUSPAR) 15 MG tablet Take 1 tablet p.o. twice daily 02/19/21  Yes Thayer Headings, PMHNP  Cholecalciferol (VITAMIN D3) 5000 units TABS 5,000 IU OTC vitamin D3 daily. 10/24/16  Yes Opalski, Neoma Laming, DO  diltiazem (CARDIZEM CD) 120 MG 24 hr capsule Take 1 capsule (120 mg total) by mouth daily. 02/08/21  Yes Minus Breeding, MD  diphenhydrAMINE HCl, Sleep, 25 MG CAPS Take by mouth. Takes 1-2 tabs po QHS   Yes [provider]  fexofenadine (ALLEGRA) 180 MG tablet Take 180 mg by mouth daily.   Yes [provider]  fluticasone (FLONASE) 50 MCG/ACT nasal spray Place 2 sprays into the nose daily.   Yes [provider]  fluticasone furoate-vilanterol (BREO ELLIPTA) 100-25 MCG/INH AEPB Inhale 1 puff into the lungs daily.   Yes [provider]  levothyroxine (SYNTHROID) 50 MCG tablet Take 1 tablet by mouth once daily 02/19/21  Yes Abonza, Maritza, PA-C  Multiple Vitamins-Minerals (  SUPER MEGA VITE 75/BETA CARO PO) Take by mouth.   Yes [provider]  nicotine (NICODERM CQ - DOSED IN MG/24 HOURS) 21 mg/24hr patch Place 1 patch (21 mg total) onto the skin daily. 01/11/21  Yes Pokhrel, Laxman, MD  omega-3 acid ethyl esters (LOVAZA) 1 g capsule Take 2 capsules (2 g total) by mouth 2 (two) times daily. 06/28/19  Yes Opalski, Neoma Laming, DO  Potassium 99 MG TABS Take 3-4 tablets by mouth daily.   Yes [provider]   tadalafil (CIALIS) 5 MG tablet Take 1 tablet (5 mg total) by mouth daily as needed for erectile dysfunction. 02/08/21  Yes Minus Breeding, MD  albuterol (VENTOLIN HFA) 108 (90 Base) MCG/ACT inhaler Inhale 2 puffs into the lungs every 4 (four) hours as needed for wheezing or shortness of breath. Patient not taking: Reported on 02/20/2021 01/10/21 01/10/22  Pokhrel, Corrie Mckusick, MD  pantoprazole (PROTONIX) 40 MG tablet Take 1 tablet (40 mg total) by mouth 2 (two) times daily before a meal for 10 days, THEN 1 tablet (40 mg total) daily. 01/10/21 02/19/21  Pokhrel, Corrie Mckusick, MD  traMADol (ULTRAM) 50 MG tablet Take 1 tablet (50 mg total) by mouth every 8 (eight) hours as needed. Patient not taking: Reported on 02/20/2021 01/25/21   Ronnell Freshwater, NP   Past Medical History:  Diagnosis Date   Allergy    Arthritis    Hx of colonic polyp 11/13/2016   10/2016 7 mm ssp/a - recall 2023   Hypertension    Hypothyroidism    Lymphocytic colitis 04/19/2019   Mild CAD 2009   Obesity    Paroxysmal atrial flutter ([facility])    Prediabetes    Recurrent upper respiratory infection (URI)    Past Surgical History:  Procedure Laterality Date   COLONOSCOPY  2018   CORONARY ANGIOGRAM  2009   Family History  Problem Relation Age of Onset   Asthma Mother    COPD Mother    Cancer Mother        Skin   Cancer - Other Mother        bone, liver, breast, lung   Emphysema Mother    Heart disease Father        Pacemaker   Parkinson's disease Father    Depression Father    Depression Daughter    OCD Daughter    Alcohol abuse Maternal Grandmother    Schizophrenia Maternal Grandmother    Diabetes Paternal Grandfather    Kidney disease Paternal Grandfather    Colon polyps Neg Hx    Colon cancer Neg Hx    Esophageal cancer Neg Hx    Stomach cancer Neg Hx    Rectal cancer Neg Hx    Social History   Socioeconomic History   Marital status: Married    Spouse name: Not on file   Number of children: 2   Years of  education: Not on file   Highest education level: Not on file  Occupational History   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     Social Determinants of Health   Financial Resource Strain: Not on file  Food Insecurity: Not on file  Transportation Needs: Not on file  Physical Activity: Not on file  Stress: Not on file  Social Connections: Not on file  Intimate Partner Violence: Not on file   ROS-see HPI   + = positive Constitutional:    weight loss, night sweats, fevers, chills, fatigue, lassitude. HEENT:    +Headaches,+ difficulty swallowing, tooth/dental problems,+ sore throat,,+sneezing, +itching, +ear ache, +nasal congestion, post nasal drip, snoring CV:    chest pain, orthopnea, PND, swelling in lower extremities, anasarca,                                  dizziness, palpitations Resp:   +shortness of breath with exertion or at rest.                productive cough,   +non-productive cough, coughing up of blood.              change in color of mucus.  wheezing.   Skin:    rash or lesions. GI:  + heartburn, indigestion, abdominal pain, nausea, vomiting, diarrhea,                 change in bowel habits, loss of appetite GU: dysuria, change in color of urine, no urgency or frequency.   flank pain. MS:   joint pain, stiffness, decreased range of motion, back pain. Neuro-     nothing unusual Psych:  change in mood  or affect.  depression or +anxiety.   memory loss.  OBJ- Physical Exam General- Alert, Oriented, Affect-appropriate, Distress- none acute, + obese Skin- rash-none, lesions- none, excoriation- none Lymphadenopathy- none Head- atraumatic            Eyes- Gross vision intact, PERRLA, conjunctivae and secretions clear            Ears- Hearing, canals-normal            Nose- Clear, no-Septal dev, mucus, polyps, erosion, perforation             Throat- Mallampati II-IVI , mucosa clear , drainage- none, tonsils- atrophic, + teeth Neck- flexible , trachea midline, no stridor , thyroid nl, carotid no bruit Chest - symmetrical excursion , unlabored           Heart/CV- RRR , no murmur , no gallop  , no rub, nl s1 s2                           - JVD- none , edema- none, stasis changes- none, varices- none           Lung- clear to P&A, wheeze- none, cough- none , dullness-none, rub- none           Chest wall-  Abd-  Br/ Gen/ Rectal- Not done, not indicated Extrem- cyanosis- none, clubbing, none, atrophy- none, strength- nl Neuro- grossly intact to observation

## 2021-02-19 NOTE — Telephone Encounter (Signed)
He was already on breo before I saw him. We wer going through workup. He can stop it and see wha happens. Please ensure he has feno test when he comes for feno  Regarding night o2 - was given to him at dc from hospital. He can stop using it today and can check with Dr Annamaria Boots during [place] with him 02/20/21

## 2021-02-19 NOTE — Telephone Encounter (Signed)
Pt would like a refill on Buspar. Please send to [facility] on [address]. Pt made appt for 04/04/21

## 2021-02-20 ENCOUNTER — Ambulatory Visit (INDEPENDENT_AMBULATORY_CARE_PROVIDER_SITE_OTHER): Payer: BC Managed Care – PPO | Admitting: Internal Medicine

## 2021-02-20 ENCOUNTER — Encounter: Payer: Self-pay | Admitting: Internal Medicine

## 2021-02-20 ENCOUNTER — Other Ambulatory Visit: Payer: Self-pay

## 2021-02-20 VITALS — BP 116/66 | HR 78 | Temp 97.6°F | Resp 20 | Ht 67.0 in | Wt 252.8 lb

## 2021-02-20 DIAGNOSIS — G479 Sleep disorder, unspecified: Secondary | ICD-10-CM | POA: Diagnosis not present

## 2021-02-20 DIAGNOSIS — J9601 Acute respiratory failure with hypoxia: Secondary | ICD-10-CM

## 2021-02-20 DIAGNOSIS — G4733 Obstructive sleep apnea (adult) (pediatric): Secondary | ICD-10-CM

## 2021-02-20 DIAGNOSIS — E669 Obesity, unspecified: Secondary | ICD-10-CM

## 2021-02-20 DIAGNOSIS — R0683 Snoring: Secondary | ICD-10-CM | POA: Diagnosis not present

## 2021-02-20 NOTE — Assessment & Plan Note (Signed)
Impact of obesity on airway patency and OSA reviewed. Normal body weight encouraged- diet/ exercise.

## 2021-02-20 NOTE — Assessment & Plan Note (Signed)
Hx and exam c/w OSA.  Appropriate discussion, questions answered. Safe driving responsibility emphasized. He is now still out of work after hospital stay. Needs OSA issue resolved andd treatment started before return to work.  Plan- we will get sleep study done as soon as available. CPAP likely a good initial choice if appropriate.

## 2021-02-20 NOTE — Patient Instructions (Addendum)
Order-  Schedule home sleep test or sleep center [facility]   Dx snoring  Please call us for result and recommendation about 2 weeks after your sleep test  Keep your appointments for pulmonary function test and to see Dr Chase Caller as scheduled in June  Use your oxygen at night and as needed with exertion  OTC nasal saline gel may help with dry nose

## 2021-02-20 NOTE — Assessment & Plan Note (Signed)
>>  ASSESSMENT AND PLAN FOR OBESITY, CLASS II, BMI 35-39.9, ISOLATED WRITTEN ON 02/20/2021  3:37 PM BY YOUNG, CLINTON D, MD  Impact of obesity on airway patency and OSA reviewed. Normal body weight encouraged- diet/ exercise.

## 2021-02-20 NOTE — Assessment & Plan Note (Signed)
We discussed home O2, advising he continue 2 L with sleep and exertion until his f/u scheduled with Dr Chase Caller next month.

## 2021-03-01 ENCOUNTER — Encounter: Payer: Self-pay | Admitting: Physician Assistant

## 2021-03-02 ENCOUNTER — Encounter: Payer: Self-pay | Admitting: *Deleted

## 2021-03-04 ENCOUNTER — Other Ambulatory Visit: Payer: Self-pay | Admitting: Cardiology

## 2021-03-05 ENCOUNTER — Telehealth: Payer: Self-pay | Admitting: Physician Assistant

## 2021-03-05 ENCOUNTER — Other Ambulatory Visit (HOSPITAL_COMMUNITY)
Admission: RE | Admit: 2021-03-05 | Discharge: 2021-03-05 | Disposition: A | Discharge status: Home / Self Care | Payer: BC Managed Care – PPO | Source: Ambulatory Visit | Attending: Internal Medicine | Admitting: Internal Medicine

## 2021-03-05 DIAGNOSIS — Z20822 Contact with and (suspected) exposure to covid-19: Secondary | ICD-10-CM | POA: Insufficient documentation

## 2021-03-05 DIAGNOSIS — D229 Melanocytic nevi, unspecified: Secondary | ICD-10-CM

## 2021-03-05 DIAGNOSIS — Z01812 Encounter for preprocedural laboratory examination: Secondary | ICD-10-CM | POA: Diagnosis not present

## 2021-03-05 LAB — SARS CORONAVIRUS 2 (TAT 6-24 HRS): SARS Coronavirus 2: NEGATIVE

## 2021-03-05 NOTE — Telephone Encounter (Signed)
Placed referral for Dermatology. AS, CMA

## 2021-03-06 ENCOUNTER — Ambulatory Visit
Admission: RE | Admit: 2021-03-06 | Discharge: 2021-03-06 | Disposition: A | Discharge status: Home / Self Care | Payer: BC Managed Care – PPO | Source: Ambulatory Visit | Attending: Internal Medicine | Admitting: Internal Medicine

## 2021-03-06 ENCOUNTER — Other Ambulatory Visit: Payer: Self-pay

## 2021-03-06 DIAGNOSIS — Z87891 Personal history of nicotine dependence: Secondary | ICD-10-CM

## 2021-03-06 DIAGNOSIS — R590 Localized enlarged lymph nodes: Secondary | ICD-10-CM

## 2021-03-06 DIAGNOSIS — Z87898 Personal history of other specified conditions: Secondary | ICD-10-CM

## 2021-03-06 DIAGNOSIS — R918 Other nonspecific abnormal finding of lung field: Secondary | ICD-10-CM

## 2021-03-06 DIAGNOSIS — Z8701 Personal history of pneumonia (recurrent): Secondary | ICD-10-CM

## 2021-03-06 MED ORDER — IOPAMIDOL (ISOVUE-300) INJECTION 61%
75.0000 mL | Freq: Once | INTRAVENOUS | Status: AC | PRN
  Administered 2021-03-06: 75 mL via INTRAVENOUS

## 2021-03-07 ENCOUNTER — Telehealth: Payer: Self-pay | Admitting: Physician Assistant

## 2021-03-07 NOTE — Telephone Encounter (Signed)
Patient does not need a referral for dermatology anymore. Thanks

## 2021-03-07 NOTE — Telephone Encounter (Signed)
error 

## 2021-03-07 NOTE — Progress Notes (Signed)
Improved cT. Has nodulea and co art calcification. B Ralph Mack to address on visit 03/08/21

## 2021-03-08 ENCOUNTER — Ambulatory Visit: Payer: BC Managed Care – PPO | Admitting: Internal Medicine

## 2021-03-08 ENCOUNTER — Telehealth: Payer: Self-pay | Admitting: Primary Care

## 2021-03-08 ENCOUNTER — Ambulatory Visit (INDEPENDENT_AMBULATORY_CARE_PROVIDER_SITE_OTHER): Payer: BC Managed Care – PPO | Admitting: Internal Medicine

## 2021-03-08 ENCOUNTER — Encounter: Payer: Self-pay | Admitting: Primary Care

## 2021-03-08 ENCOUNTER — Other Ambulatory Visit: Payer: Self-pay

## 2021-03-08 ENCOUNTER — Ambulatory Visit (INDEPENDENT_AMBULATORY_CARE_PROVIDER_SITE_OTHER): Payer: BC Managed Care – PPO | Admitting: Primary Care

## 2021-03-08 VITALS — BP 120/80 | HR 68 | Temp 97.4°F | Ht 67.0 in | Wt 257.0 lb

## 2021-03-08 DIAGNOSIS — Z87898 Personal history of other specified conditions: Secondary | ICD-10-CM

## 2021-03-08 DIAGNOSIS — Z87891 Personal history of nicotine dependence: Secondary | ICD-10-CM

## 2021-03-08 DIAGNOSIS — R590 Localized enlarged lymph nodes: Secondary | ICD-10-CM

## 2021-03-08 DIAGNOSIS — R918 Other nonspecific abnormal finding of lung field: Secondary | ICD-10-CM

## 2021-03-08 DIAGNOSIS — G479 Sleep disorder, unspecified: Secondary | ICD-10-CM | POA: Diagnosis not present

## 2021-03-08 DIAGNOSIS — Z8701 Personal history of pneumonia (recurrent): Secondary | ICD-10-CM

## 2021-03-08 DIAGNOSIS — I2510 Atherosclerotic heart disease of native coronary artery without angina pectoris: Secondary | ICD-10-CM | POA: Diagnosis not present

## 2021-03-08 DIAGNOSIS — J449 Chronic obstructive pulmonary disease, unspecified: Secondary | ICD-10-CM

## 2021-03-08 LAB — PULMONARY FUNCTION TEST
DL/VA % pred: 97 %
DL/VA: 4.23 ml/min/mmHg/L
DLCO cor % pred: 65 %
DLCO cor: 16.68 ml/min/mmHg
DLCO unc % pred: 65 %
DLCO unc: 16.68 ml/min/mmHg
FEF 25-75 Post: 1.62 L/sec
FEF 25-75 Pre: 1.5 L/sec
FEF2575-%Change-Post: 8 %
FEF2575-%Pred-Post: 56 %
FEF2575-%Pred-Pre: 52 %
FEV1-%Change-Post: 2 %
FEV1-%Pred-Post: 66 %
FEV1-%Pred-Pre: 64 %
FEV1-Post: 2.22 L
FEV1-Pre: 2.16 L
FEV1FVC-%Change-Post: 0 %
FEV1FVC-%Pred-Pre: 93 %
FEV6-%Change-Post: 3 %
FEV6-%Pred-Post: 75 %
FEV6-%Pred-Pre: 72 %
FEV6-Post: 3.14 L
FEV6-Pre: 3.04 L
FEV6FVC-%Pred-Post: 104 %
FEV6FVC-%Pred-Pre: 104 %
FVC-%Change-Post: 3 %
FVC-%Pred-Post: 72 %
FVC-%Pred-Pre: 69 %
FVC-Post: 3.15 L
FVC-Pre: 3.04 L
Post FEV1/FVC ratio: 70 %
Post FEV6/FVC ratio: 100 %
Pre FEV1/FVC ratio: 71 %
Pre FEV6/FVC Ratio: 100 %
RV % pred: 88 %
RV: 1.79 L
TLC % pred: 88 %
TLC: 5.65 L

## 2021-03-08 MED ORDER — ALBUTEROL SULFATE HFA 108 (90 BASE) MCG/ACT IN AERS: 2.0000 | INHALATION_SPRAY | RESPIRATORY_TRACT | 0 refills | Status: DC | PRN

## 2021-03-08 NOTE — Assessment & Plan Note (Addendum)
-   CT CHEST showed some scattered sold bilateral pulmonary nodules measuring 1mm, unchanged from April 2022 - FU CT chest wo contrast in 6 months

## 2021-03-08 NOTE — Patient Instructions (Addendum)
PFTs showed mild-moderate obstruction consistent with COPD You are clinically asymptomatic, you do not need a daily maintenance inhaler at this time. We will send in prescription for Albuterol rescue inhaler, use this every 6 hours as needed for breakthrough shortness, chest tightness or wheezing   Orders: CT chest wo contrast in 6 months Looking into scheduling sleep study We will send notes via patrice to employer   Follow-up: 6 months after CT scan with Dr. Chase Caller.

## 2021-03-08 NOTE — Assessment & Plan Note (Signed)
Following with cardiology. 

## 2021-03-08 NOTE — Telephone Encounter (Signed)
Can you please look into patient sleep study, has this been scheduled?

## 2021-03-08 NOTE — Assessment & Plan Note (Addendum)
-   He is doing quit well. Denies dyspnea or residual respiratory symptoms. He has a reactive cough when outside doing yard work. He quit smoking in April 2022, continue to encourage abstinence  - Pulmonary function testing 03/08/2021 showed minimal obstructive defect consistet with stage 1 COPD. Because he is mostly asymptotic would not recommend regular maintenance inhaler. We will refill Albuterol HFA which he can use as needed for breakthrough symptoms sob/wheezing/chest tightness or cough.

## 2021-03-08 NOTE — Patient Instructions (Signed)
Full PFT performed today. °

## 2021-03-08 NOTE — Progress Notes (Signed)
@Patient  ID: Ralph Mack, male    DOB: 11/26/1965, 55 y.o.   MRN: [id number]  Chief Complaint  Patient presents with   Follow-up    Had PFT today.    Referring provider: [name], PA-C  HPI: 55 year old male, former smoker quit in April 2022 (18-1/2 pack year history).  Past medical history significant for hypertension, a flutter, coronary artery disease, simple chronic bronchitis, acute respiratory failure with hypoxia, hypothyroidism, obesity.  Patient of Dr. Chase Caller seen for initial consult on 01/25/2021 for history of recent pneumonia, multiple lung nodules.  He is also following with Dr. Annamaria Boots for possible sleep apnea.  Previous [facility] pulmonary encounter: 01/25/21- Dr. Chase Caller  Chief Complaint  Patient presents with   Consult    SOB and wheezing, getting better slowly   HPI Ralph Mack 55 y.o. -new consultation.  He is a Administrator.  Former smoker.  He quit after his hospitalization.  History is gained from review of the chart and talking to him.  He also suffers from obesity, hypothyroidism, dyslipidemia, hypertension.  He was hospitalized on January 06, 2021.  At that time apparently was having fatigue for the last few to several weeks prior to the admission.  He was also noticed that he fell asleep on the steering wheel of his truck 1-2 times.  He drives around 5 miles a day.  At the time of hospitalization he was found to be hypoxemic with a left lower lobe pneumonia.  Pulmonary embolism was ruled out.  Incidental findings include subcarinal lymphadenopathy 1.5 cm along with other nodes.  And also 8 mm nodule along with another nodule.  He was found to be in paroxysmal atrial fibrillation.  Discharged on Eliquis.  He was also discharged on oxygen which he is now only using at night.  He says he does not need it at daytime.  In fact when we walked him 185 feet x 3 laps on room air he did not desaturate.  Overall he is feeling better.  He is very worried about his CT scan  chest findings that I personally visualized with him and documented below.  He is worried about cancer.  In addition he is worried about sleep apnea risk.  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  He is asking for FMLA form to be filled out.  Since discharge he also strained his left knee and has a knee brace. Noted with smoking history is concerned about presence of COPD.  03/08/2021 - Interim hx  Patient presents today for 1 month follow-up. He is doing well, no acute complaints. He has not respiratory symptoms. Dyspnea has resolved. He quit smoking in March 2022, he has gained 20 lbs since quitting. He has reactive cough when outside doing yard work. He is awaiting approval from his insurance to schedule sleep study, until then he is out of work until early July. Denies f/c/s, significant cough, chest tightness or wheezing.   Imaging: CT CHEST WO CONTRAST 03/06/21- showed interval resolution of previous left sided pneumonia. He has some scattered sold bilateral pulmonary nodules left lower lobe, largest measuring 46mm unchanged in short interval. FU in 6 months with chest CT. Mild mediastinal lymphadenopathy is mildly decreased suggesting benign etiology.   Cardiac testing: Echo in April 2022 showed EF 60-65%, grade II diastolic dysfunction.   Pulmonary function testing: 03/08/2021 FEV1 2.22 (66%), ratio 70, TLC 88%    No Known Allergies  Immunization History  Administered Date(s) Administered   Influenza,inj,Quad  PF,6+ Mos 07/26/2015    Past Medical History:  Diagnosis Date   Allergy    Arthritis    Hx of colonic polyp 11/13/2016   10/2016 7 mm ssp/a - recall 2023   Hypertension    Hypothyroidism    Lymphocytic colitis 04/19/2019   Mild CAD 2009   Obesity    Paroxysmal atrial flutter (HCC)    Prediabetes    Recurrent upper respiratory infection (URI)     Tobacco History: Social History   Tobacco Use  Smoking Status Former   Packs/day: 0.50   Years: 37.00    Pack years: 18.50   Types: Cigarettes   Quit date: 01/10/2021   Years since quitting: 0.1  Smokeless Tobacco Never  Tobacco Comments   not smoking currently-[place] 02/20/21   Counseling given: Not Answered Tobacco comments: not smoking currently-[place] 02/20/21   Outpatient Medications Prior to Visit  Medication Sig Dispense Refill   allopurinol (ZYLOPRIM) 300 MG tablet Take 1 tablet (300 mg total) by mouth daily. 90 tablet 1   apixaban (ELIQUIS) 5 MG TABS tablet Take 1 tablet (5 mg total) by mouth 2 (two) times daily. 60 tablet 2   busPIRone (BUSPAR) 15 MG tablet Take 1 tablet p.o. twice daily 60 tablet 1   Cholecalciferol (VITAMIN D3) 5000 units TABS 5,000 IU OTC vitamin D3 daily. 90 tablet 3   diltiazem (CARDIZEM CD) 120 MG 24 hr capsule Take 1 capsule (120 mg total) by mouth daily. 90 capsule 3   diphenhydrAMINE HCl, Sleep, 25 MG CAPS Take by mouth. Takes 1-2 tabs po QHS     fexofenadine (ALLEGRA) 180 MG tablet Take 180 mg by mouth daily.     fluticasone (FLONASE) 50 MCG/ACT nasal spray Place 2 sprays into the nose daily.     levothyroxine (SYNTHROID) 50 MCG tablet Take 1 tablet by mouth once daily 90 tablet 0   Multiple Vitamins-Minerals (SUPER MEGA VITE 75/BETA CARO PO) Take by mouth.     omega-3 acid ethyl esters (LOVAZA) 1 g capsule Take 2 capsules (2 g total) by mouth 2 (two) times daily. 180 capsule 3   Potassium 99 MG TABS Take 3-4 tablets by mouth daily.     tadalafil (CIALIS) 5 MG tablet TAKE 1 TABLET BY MOUTH ONCE DAILY AS NEEDED FOR ERECTILE DYSFUNCTION 10 tablet 0   fluticasone furoate-vilanterol (BREO ELLIPTA) 100-25 MCG/INH AEPB Inhale 1 puff into the lungs daily. (Patient not taking: Reported on 03/08/2021)     nicotine (NICODERM CQ - DOSED IN MG/24 HOURS) 21 mg/24hr patch Place 1 patch (21 mg total) onto the skin daily. (Patient not taking: Reported on 03/08/2021) 28 patch 0   pantoprazole (PROTONIX) 40 MG tablet Take 1 tablet (40 mg total) by mouth 2 (two) times daily before a meal  for 10 days, THEN 1 tablet (40 mg total) daily. 50 tablet 0   traMADol (ULTRAM) 50 MG tablet Take 1 tablet (50 mg total) by mouth every 8 (eight) hours as needed. (Patient not taking: No sig reported) 15 tablet 0   albuterol (VENTOLIN HFA) 108 (90 Base) MCG/ACT inhaler Inhale 2 puffs into the lungs every 4 (four) hours as needed for wheezing or shortness of breath. (Patient not taking: No sig reported) 6.7 g 0   No facility-administered medications prior to visit.   Review of Systems  Review of Systems  Constitutional:  Negative for chills and fever.  HENT:  Positive for postnasal drip. Negative for congestion.   Respiratory:  Negative for cough, choking,  chest tightness, shortness of breath and wheezing.   Cardiovascular: Negative.     Physical Exam  BP 120/80 (BP Location: Left Arm, Cuff Size: Large)   Pulse 68   Temp (!) 97.4 F (36.3 C) (Temporal)   Ht 5\' 7"  (1.702 m)   Wt 257 lb (116.6 kg)   SpO2 93%   BMI 40.25 kg/m  Physical Exam Constitutional:      Appearance: Normal appearance. He is obese.  HENT:     Head: Normocephalic and atraumatic.     Mouth/Throat:     Mouth: Mucous membranes are moist.     Pharynx: Oropharynx is clear.  Cardiovascular:     Rate and Rhythm: Normal rate and regular rhythm.  Pulmonary:     Effort: Pulmonary effort is normal.     Breath sounds: Normal breath sounds.  Musculoskeletal:        General: Normal range of motion.  Skin:    General: Skin is warm and dry.  Neurological:     General: No focal deficit present.     Mental Status: He is alert and oriented to person, place, and time. Mental status is at baseline.  Psychiatric:        Mood and Affect: Mood normal.        Behavior: Behavior normal.        Thought Content: Thought content normal.        Judgment: Judgment normal.     Lab Results:  CBC    Component Value Date/Time   WBC 10.8 01/16/2021 1531   WBC 8.4 01/10/2021 0449   RBC 4.64 01/16/2021 1531   RBC 4.57  01/10/2021 0449   HGB 14.9 01/16/2021 1531   HCT 43.9 01/16/2021 1531   PLT 178 01/16/2021 1531   MCV 95 01/16/2021 1531   MCH 32.1 01/16/2021 1531   MCH 31.9 01/10/2021 0449   MCHC 33.9 01/16/2021 1531   MCHC 32.0 01/10/2021 0449   RDW 11.9 01/16/2021 1531   LYMPHSABS 1.5 01/16/2021 1531   MONOABS 0.8 01/06/2021 0932   EOSABS 0.1 01/16/2021 1531   BASOSABS 0.0 01/16/2021 1531    BMET    Component Value Date/Time   NA 140 01/16/2021 1531   K 4.4 01/16/2021 1531   CL 97 01/16/2021 1531   CO2 26 01/16/2021 1531   GLUCOSE 138 (H) 01/16/2021 1531   GLUCOSE 130 (H) 01/10/2021 0449   BUN 19 01/16/2021 1531   CREATININE 0.85 01/16/2021 1531   CALCIUM 8.8 01/16/2021 1531   GFRNONAA >60 01/10/2021 0449   GFRAA 121 08/07/2020 1100    BNP    Component Value Date/Time   BNP 72.3 01/06/2021 0932    ProBNP No results found for: PROBNP  Imaging: CT Chest W Contrast  Result Date: 03/06/2021 CLINICAL DATA:  Follow-up pneumonia, mediastinal adenopathy and pulmonary nodules. EXAM: CT CHEST WITH CONTRAST TECHNIQUE: Multidetector CT imaging of the chest was performed during intravenous contrast administration. CONTRAST:  8mL ISOVUE-300 IOPAMIDOL (ISOVUE-300) INJECTION 61% COMPARISON:  01/06/2021 chest CT angiogram. FINDINGS: Cardiovascular: Normal heart size. No significant pericardial effusion/thickening. Left anterior descending and right coronary atherosclerosis. Minimally atherosclerotic nonaneurysmal thoracic aorta. Dilated main pulmonary artery (3.5 cm diameter). Mediastinum/Nodes: No discrete thyroid nodules. Unremarkable esophagus. No axillary adenopathy. Mildly enlarged 1.1 cm AP window node (series 2/image 63), mildly decreased from 1.3 cm on 01/06/2021 chest CT. No new pathologically enlarged mediastinal nodes. Previously visualized mild subcarinal adenopathy has resolved. No pathologically enlarged hilar nodes. Lungs/Pleura: No pneumothorax. No pleural effusion.  Previously  visualized patchy regions of consolidation in the lingula and left lower lobe have resolved. No acute consolidative airspace disease or lung masses. A few scattered solid pulmonary nodules in both lungs, largest 8 mm in the anterior left lower lobe (series 5/image 80) and 4 mm in the basilar right upper lobe along the minor fissure, all unchanged in the short interval. No new significant pulmonary nodules. Stable mild eventration of the lateral right hemidiaphragm with associated mild passive atelectasis at the right lung base. Mild centrilobular and paraseptal emphysema. Upper abdomen: No acute abnormality. Musculoskeletal: No aggressive appearing focal osseous lesions. Mild thoracic spondylosis. IMPRESSION: 1. Interval resolution of previously described left-sided pneumonia. 2. Scattered solid bilateral pulmonary nodules, largest 8 mm in the left lower lobe, all unchanged in the short interval. Non-contrast chest CT suggested in 6 months. If the nodules are stable at time of repeat CT, then future CT at 18-24 months (from today's scan) is considered optional for low-risk patients, but is recommended for high-risk patients. This recommendation follows the consensus statement: Guidelines for Management of Incidental Pulmonary Nodules Detected on CT Images: From the Fleischner Society 2017; Radiology 2017; 284:228-243. 3. Mild mediastinal lymphadenopathy is mildly decreased, most suggestive of a benign reactive etiology. 4. Dilated main pulmonary artery, suggesting pulmonary arterial hypertension. 5. Two-vessel coronary atherosclerosis. 6. Aortic Atherosclerosis (ICD10-I70.0) and Emphysema (ICD10-J43.9). Electronically Signed   By: Ilona Sorrel M.D.   On: 03/06/2021 21:53     Assessment & Plan:   CAD (coronary artery disease) - Following with cardiology   COPD (chronic obstructive pulmonary disease) ([place]) - He is doing quit well. Denies dyspnea or residual respiratory symptoms. He has a reactive cough when  outside doing yard work. He quit smoking in April 2022, continue to encourage abstinence  - Pulmonary function testing 03/08/2021 showed minimal obstructive defect consistet with stage 1 COPD. Because he is mostly asymptotic would not recommend regular maintenance inhaler. We will refill Albuterol HFA which he can use as needed for breakthrough symptoms sob/wheezing/chest tightness or cough.   Sleeping difficulty - We have reached out to our patient care coordinated to help facilitate scheduling sleep study, we are currently waiting on insurance approval. He will remain out of work until early July.   Multiple pulmonary nodules - CT CHEST showed some scattered sold bilateral pulmonary nodules measuring 23mm, unchanged from April 2022 - FU CT chest wo contrast in 6 months   Martyn Ehrich, NP 03/12/2021

## 2021-03-08 NOTE — Progress Notes (Signed)
Full PFT performed today. °

## 2021-03-08 NOTE — Assessment & Plan Note (Addendum)
-   We have reached out to our patient care coordinated to help facilitate scheduling sleep study, we are currently waiting on insurance approval. He will remain out of work until early July.

## 2021-03-09 ENCOUNTER — Other Ambulatory Visit: Payer: Self-pay | Admitting: *Deleted

## 2021-03-09 MED ORDER — ALBUTEROL SULFATE HFA 108 (90 BASE) MCG/ACT IN AERS: 2.0000 | INHALATION_SPRAY | RESPIRATORY_TRACT | 3 refills | Status: DC | PRN

## 2021-03-09 NOTE — Telephone Encounter (Signed)
Thanks, Ralph Mack can you let patient know it has not been approved by his insurance yet, once it has we can schedule

## 2021-03-09 NOTE — Telephone Encounter (Signed)
Order was placed on 5/24 and is still waiting to be precerted.

## 2021-03-12 ENCOUNTER — Other Ambulatory Visit: Payer: BC Managed Care – PPO

## 2021-03-12 NOTE — Telephone Encounter (Signed)
PCCs do you have any updates on when this pt may be scheduled for his HST? Thank you

## 2021-03-12 NOTE — Telephone Encounter (Signed)
Order was placed on 5/24.  Sleep studies have to be precerted and then we call them in the order pt has been seen.  It normally takes 4-6 weeks before pt is contacted.  Will route back to triage so they can make pt aware.

## 2021-03-13 ENCOUNTER — Ambulatory Visit: Payer: BC Managed Care – PPO | Admitting: Physician Assistant

## 2021-03-13 ENCOUNTER — Telehealth: Payer: Self-pay | Admitting: Primary Care

## 2021-03-13 NOTE — Telephone Encounter (Signed)
Called and spoke with pt making  him aware that we are still waiting on precert to be able to be done for the sleep study. Pt verbalized understanding.

## 2021-03-13 NOTE — Telephone Encounter (Signed)
Call returned to patient, confirmed DOB. Patient states he needs the office visits notes sent to [place]. Patient also states the paperwork should contain some case number that has to be included. Win Number.   Fax number [phone]  Patient states Sharl Ma was helping him with this and he wanted check status.  Pat[place] please advise. Thanks :)

## 2021-03-19 NOTE — Telephone Encounter (Signed)
Message routed to [facility]    I would like if someone could have my disability leave extended another 3 weeks until at least the 27th of July,2022 Because of the delay in scheduling a sleep study test. I have other things I will need to do after all is done, likwe scheduling a D.O.T Exam with my company and getting set up if needed a CPAP machine. I would need this sent to [facility] today to get the ball rolling, so I can get paid, I don't want the stress of waiting until the last minute. Please! And Thankyou! If any questions message me back or feel free to call [phone]

## 2021-03-20 NOTE — Telephone Encounter (Signed)
Patient was contacted, Dr.Hochrein is not able to complete the forms for this patient.

## 2021-03-22 ENCOUNTER — Other Ambulatory Visit: Payer: Self-pay | Admitting: Physician Assistant

## 2021-03-22 DIAGNOSIS — E039 Hypothyroidism, unspecified: Secondary | ICD-10-CM

## 2021-03-22 NOTE — Telephone Encounter (Signed)
Message routed to [facility]  I am not trying to be the impatient patient! But, I still have not heard from anyone regarding anything, has anyone let my Disability insurance company "[employer]" know about the delay and authorize an extension from your office? Please let me know because I was told to let them know 2 weeks before my return, and we are already at that point, I will need to be paid. Thanks!

## 2021-03-22 NOTE — H&P (View-Only) (Signed)
Electrophysiology Office Note:    Date:  03/23/2021   ID:  Ralph Mack, DOB 09-02-1966, MRN [id number]  PCP:  [name], PA-C  [facility] Cardiologist:  Minus Breeding, MD  [place] Electrophysiologist:  None   Referring MD: Minus Breeding, MD   Chief Complaint: Atrial flutter  History of Present Illness:    Ralph Mack is a 55 y.o. male who presents for an evaluation of atrial flutter at the request of Dr. Percival Spanish. Their medical history includes hypertension, obesity, prediabetes.  The patient was last seen by Dr. Percival Spanish on Feb 08, 2021.  That appointment came after hospitalization for acute respiratory failure because of pneumonia.  During that hospitalization he was noted to have paroxysmal atrial flutter.  The patient has no prior history of atrial fibrillation.  He is being referred to discuss ablation for his atrial flutter in an effort to avoid long-term anticoagulation.  Patient is doing well since leaving the hospital.  His breathing is improving.  He still intermittently uses oxygen at night.  He has not smoked in 74 days.  Still drinks occasionally.  Only on his off days.  He will drink up to a sixpack of beer.  Past Medical History:  Diagnosis Date   Allergy    Arthritis    Hx of colonic polyp 11/13/2016   10/2016 7 mm ssp/a - recall 2023   Hypertension    Hypothyroidism    Lymphocytic colitis 04/19/2019   Mild CAD 2009   Obesity    Paroxysmal atrial flutter (HCC)    Prediabetes    Recurrent upper respiratory infection (URI)     Past Surgical History:  Procedure Laterality Date   COLONOSCOPY  2018   CORONARY ANGIOGRAM  2009    Current Medications: Current Meds  Medication Sig   albuterol (PROAIR HFA) 108 (90 Base) MCG/ACT inhaler Inhale 2 puffs into the lungs every 4 (four) hours as needed for wheezing or shortness of breath.   allopurinol (ZYLOPRIM) 300 MG tablet Take 1 tablet (300 mg total) by mouth daily.   apixaban (ELIQUIS) 5 MG  TABS tablet Take 1 tablet (5 mg total) by mouth 2 (two) times daily.   busPIRone (BUSPAR) 15 MG tablet Take 1 tablet p.o. twice daily   Cholecalciferol (VITAMIN D3) 5000 units TABS 5,000 IU OTC vitamin D3 daily.   diltiazem (CARDIZEM CD) 120 MG 24 hr capsule Take 1 capsule (120 mg total) by mouth daily.   diphenhydrAMINE HCl, Sleep, 25 MG CAPS Take by mouth. Takes 1-2 tabs po QHS   EUTHYROX 50 MCG tablet Take 1 tablet by mouth once daily   fexofenadine (ALLEGRA) 180 MG tablet Take 180 mg by mouth daily.   fluticasone (FLONASE) 50 MCG/ACT nasal spray Place 2 sprays into the nose daily.   hydrochlorothiazide (HYDRODIURIL) 12.5 MG tablet Take 12.5 mg by mouth daily.   losartan (COZAAR) 50 MG tablet Take 50 mg by mouth daily.   Multiple Vitamins-Minerals (SUPER MEGA VITE 75/BETA CARO PO) Take by mouth.   nicotine (NICODERM CQ - DOSED IN MG/24 HOURS) 21 mg/24hr patch Place 1 patch (21 mg total) onto the skin daily.   omega-3 acid ethyl esters (LOVAZA) 1 g capsule Take 2 capsules (2 g total) by mouth 2 (two) times daily.   Potassium 99 MG TABS Take 3-4 tablets by mouth daily.   tadalafil (CIALIS) 5 MG tablet TAKE 1 TABLET BY MOUTH ONCE DAILY AS NEEDED FOR ERECTILE DYSFUNCTION     Allergies:   Patient has  no known allergies.   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     Social Determinants of Health   Financial Resource Strain: Not on file  Food Insecurity: Not on file  Transportation Needs: Not on file  Physical Activity: Not on file  Stress: Not on file  Social Connections: Not on file     Family History: The patient's family history includes Alcohol abuse in his maternal grandmother; Asthma in his mother; COPD in his mother; Cancer in his mother; Cancer - Other in his mother; Depression in his daughter and father; Diabetes in his paternal grandfather; Emphysema in his mother; Heart disease in his father; Kidney disease in his paternal grandfather; OCD in his daughter; Parkinson's disease in his father; Schizophrenia in his maternal grandmother. There is no history of Colon polyps, Colon cancer, Esophageal cancer, Stomach cancer, or Rectal cancer.  ROS:   Please see the history of present illness.    All other systems reviewed and are negative.  EKGs/Labs/Other Studies Reviewed:    The following studies were reviewed today:  January 07, 2021 echo personally reviewed Left ventricular function normal, 60% Mild to moderate LVH Right ventricular function normal Trivial MR  January 08, 2021 EKG Typical appearing atrial flutter with 2 1 conduction   Feb 08, 2021 EKG Sinus rhythm   EKG:  The ekg ordered today demonstrates sinus arrhythmia  Recent Labs: 01/06/2021: B Natriuretic Peptide 72.3; TSH 2.972 01/16/2021: ALT 31; BUN 19; Creatinine, Ser 0.85; Hemoglobin 14.9; Magnesium 1.9; Platelets 178; Potassium 4.4; Sodium 140  Recent Lipid Panel    Component Value Date/Time   CHOL 191 01/10/2021 0450   CHOL 190 08/07/2020 1100   TRIG 105 01/10/2021 0450   HDL 66 01/10/2021 0450   HDL 59 08/07/2020 1100   CHOLHDL 2.9 01/10/2021 0450   VLDL 21 01/10/2021 0450   LDLCALC 104 (H) 01/10/2021 0450    LDLCALC 94 08/07/2020 1100    Physical Exam:    VS:  BP 118/76   Pulse 64   Ht 5\' 7"  (1.702 m)   Wt 261 lb 6.4 oz (118.6 kg)   SpO2 96%   BMI 40.94 kg/m     Wt Readings from Last 3 Encounters:  03/23/21 261 lb 6.4 oz (118.6 kg)  03/08/21 257 lb (116.6 kg)  02/20/21 252 lb 12.8 oz (114.7 kg)     GEN:  Well nourished, well developed in no acute distress.  Obese HEENT: Normal NECK: No JVD; No carotid bruits LYMPHATICS: No lymphadenopathy CARDIAC:  RRR, no murmurs, rubs, gallops RESPIRATORY:  Clear to auscultation without rales, wheezing or rhonchi  ABDOMEN: Soft, non-tender, non-distended MUSCULOSKELETAL:  No edema; No deformity  SKIN: Warm and dry NEUROLOGIC:  Alert and oriented x 3 PSYCHIATRIC:  Normal affect   ASSESSMENT:    1. Typical atrial flutter ([place])   2. Essential hypertension   3. Coronary artery disease involving native coronary artery of native heart without angina pectoris    PLAN:    In order of problems listed above:  Typical appearing atrial flutter Symptomatic.  We discussed management options for his atrial flutter including continued conservative management with anticoagulation for stroke prophylaxis, rhythm control strategy using antiarrhythmic drug therapy or rhythm control strategy using ablation.  Given his young age, I do think a rhythm control strategy is warranted.  He does not have evidence of atrial fibrillation so an ablation strategy would target typical atrial flutter only.  We discussed the possibility that after eliminating the atrial flutter circuit that atrial fibrillation could emerge.  We discussed that if this were to happen he would need to be back on anticoagulation and we would need to consider therapy options for atrial fibrillation including ablation versus antiarrhythmic drug therapy.  I have recommended that we pursue atrial flutter ablation followed by loop recorder implant for ongoing surveillance for atrial fibrillation.  The  strategy would allow him to discontinue his anticoagulant 3 months after the flutter ablation to minimize exposure to the blood thinner and its associated risks.  Risk, benefits, and alternatives to EP study and radiofrequency ablation for afib were also discussed in detail today. These risks include but are not limited to stroke, bleeding, vascular damage, tamponade, perforation, damage to the esophagus, lungs, and other structures, pulmonary vein stenosis, worsening renal function, and death. The patient understands these risk and wishes to proceed.  We will therefore proceed with catheter ablation at the next available time.  Carto, ICE, anesthesia are requested for the procedure.    He would also like to change from Eliquis to Xarelto given some fatigue he associates with the morning dose of Eliquis.   2.  Hypertension Controlled.  Continue current medications.  3.  Coronary artery disease No ischemic symptoms.  Total time spent with patient today 65 minutes. This includes reviewing records, evaluating the patient and coordinating care.  Medication Adjustments/Labs and Tests Ordered: Current medicines are reviewed at length with the patient today.  Concerns regarding medicines are outlined above.  Orders Placed This Encounter  Procedures   Basic Metabolic Panel (BMET)   CBC w/Diff   EKG 12-Lead    No orders of the defined types were placed in this encounter.    Signed, Hilton Cork. Quentin Ore, MD, [facility], [facility] 03/23/2021 12:49 PM    Electrophysiology [facility]

## 2021-03-22 NOTE — Telephone Encounter (Signed)
Please reach out to patient to reschedule his cancelled follow up appointment.

## 2021-03-22 NOTE — Progress Notes (Signed)
Electrophysiology Office Note:    Date:  03/23/2021   ID:  Mariano Doshi, DOB 12-19-1965, MRN [id number]  PCP:  [name], PA-C  [facility] Cardiologist:  Minus Breeding, MD  [place] Electrophysiologist:  None   Referring MD: Minus Breeding, MD   Chief Complaint: Atrial flutter  History of Present Illness:    Ralph Mack is a 55 y.o. male who presents for an evaluation of atrial flutter at the request of Dr. Percival Spanish. Their medical history includes hypertension, obesity, prediabetes.  The patient was last seen by Dr. Percival Spanish on Feb 08, 2021.  That appointment came after hospitalization for acute respiratory failure because of pneumonia.  During that hospitalization he was noted to have paroxysmal atrial flutter.  The patient has no prior history of atrial fibrillation.  He is being referred to discuss ablation for his atrial flutter in an effort to avoid long-term anticoagulation.  Patient is doing well since leaving the hospital.  His breathing is improving.  He still intermittently uses oxygen at night.  He has not smoked in 74 days.  Still drinks occasionally.  Only on his off days.  He will drink up to a sixpack of beer.  Past Medical History:  Diagnosis Date   Allergy    Arthritis    Hx of colonic polyp 11/13/2016   10/2016 7 mm ssp/a - recall 2023   Hypertension    Hypothyroidism    Lymphocytic colitis 04/19/2019   Mild CAD 2009   Obesity    Paroxysmal atrial flutter (HCC)    Prediabetes    Recurrent upper respiratory infection (URI)     Past Surgical History:  Procedure Laterality Date   COLONOSCOPY  2018   CORONARY ANGIOGRAM  2009    Current Medications: Current Meds  Medication Sig   albuterol (PROAIR HFA) 108 (90 Base) MCG/ACT inhaler Inhale 2 puffs into the lungs every 4 (four) hours as needed for wheezing or shortness of breath.   allopurinol (ZYLOPRIM) 300 MG tablet Take 1 tablet (300 mg total) by mouth daily.   apixaban (ELIQUIS) 5 MG  TABS tablet Take 1 tablet (5 mg total) by mouth 2 (two) times daily.   busPIRone (BUSPAR) 15 MG tablet Take 1 tablet p.o. twice daily   Cholecalciferol (VITAMIN D3) 5000 units TABS 5,000 IU OTC vitamin D3 daily.   diltiazem (CARDIZEM CD) 120 MG 24 hr capsule Take 1 capsule (120 mg total) by mouth daily.   diphenhydrAMINE HCl, Sleep, 25 MG CAPS Take by mouth. Takes 1-2 tabs po QHS   EUTHYROX 50 MCG tablet Take 1 tablet by mouth once daily   fexofenadine (ALLEGRA) 180 MG tablet Take 180 mg by mouth daily.   fluticasone (FLONASE) 50 MCG/ACT nasal spray Place 2 sprays into the nose daily.   hydrochlorothiazide (HYDRODIURIL) 12.5 MG tablet Take 12.5 mg by mouth daily.   losartan (COZAAR) 50 MG tablet Take 50 mg by mouth daily.   Multiple Vitamins-Minerals (SUPER MEGA VITE 75/BETA CARO PO) Take by mouth.   nicotine (NICODERM CQ - DOSED IN MG/24 HOURS) 21 mg/24hr patch Place 1 patch (21 mg total) onto the skin daily.   omega-3 acid ethyl esters (LOVAZA) 1 g capsule Take 2 capsules (2 g total) by mouth 2 (two) times daily.   Potassium 99 MG TABS Take 3-4 tablets by mouth daily.   tadalafil (CIALIS) 5 MG tablet TAKE 1 TABLET BY MOUTH ONCE DAILY AS NEEDED FOR ERECTILE DYSFUNCTION     Allergies:   Patient has  no known allergies.   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     Social Determinants of Health   Financial Resource Strain: Not on file  Food Insecurity: Not on file  Transportation Needs: Not on file  Physical Activity: Not on file  Stress: Not on file  Social Connections: Not on file     Family History: The patient's family history includes Alcohol abuse in his maternal grandmother; Asthma in his mother; COPD in his mother; Cancer in his mother; Cancer - Other in his mother; Depression in his daughter and father; Diabetes in his paternal grandfather; Emphysema in his mother; Heart disease in his father; Kidney disease in his paternal grandfather; OCD in his daughter; Parkinson's disease in his father; Schizophrenia in his maternal grandmother. There is no history of Colon polyps, Colon cancer, Esophageal cancer, Stomach cancer, or Rectal cancer.  ROS:   Please see the history of present illness.    All other systems reviewed and are negative.  EKGs/Labs/Other Studies Reviewed:    The following studies were reviewed today:  January 07, 2021 echo personally reviewed Left ventricular function normal, 60% Mild to moderate LVH Right ventricular function normal Trivial MR  January 08, 2021 EKG Typical appearing atrial flutter with 2 1 conduction   Feb 08, 2021 EKG Sinus rhythm   EKG:  The ekg ordered today demonstrates sinus arrhythmia  Recent Labs: 01/06/2021: B Natriuretic Peptide 72.3; TSH 2.972 01/16/2021: ALT 31; BUN 19; Creatinine, Ser 0.85; Hemoglobin 14.9; Magnesium 1.9; Platelets 178; Potassium 4.4; Sodium 140  Recent Lipid Panel    Component Value Date/Time   CHOL 191 01/10/2021 0450   CHOL 190 08/07/2020 1100   TRIG 105 01/10/2021 0450   HDL 66 01/10/2021 0450   HDL 59 08/07/2020 1100   CHOLHDL 2.9 01/10/2021 0450   VLDL 21 01/10/2021 0450   LDLCALC 104 (H) 01/10/2021 0450    LDLCALC 94 08/07/2020 1100    Physical Exam:    VS:  BP 118/76   Pulse 64   Ht 5\' 7"  (1.702 m)   Wt 261 lb 6.4 oz (118.6 kg)   SpO2 96%   BMI 40.94 kg/m     Wt Readings from Last 3 Encounters:  03/23/21 261 lb 6.4 oz (118.6 kg)  03/08/21 257 lb (116.6 kg)  02/20/21 252 lb 12.8 oz (114.7 kg)     GEN:  Well nourished, well developed in no acute distress.  Obese HEENT: Normal NECK: No JVD; No carotid bruits LYMPHATICS: No lymphadenopathy CARDIAC:  RRR, no murmurs, rubs, gallops RESPIRATORY:  Clear to auscultation without rales, wheezing or rhonchi  ABDOMEN: Soft, non-tender, non-distended MUSCULOSKELETAL:  No edema; No deformity  SKIN: Warm and dry NEUROLOGIC:  Alert and oriented x 3 PSYCHIATRIC:  Normal affect   ASSESSMENT:    1. Typical atrial flutter ([place])   2. Essential hypertension   3. Coronary artery disease involving native coronary artery of native heart without angina pectoris    PLAN:    In order of problems listed above:  Typical appearing atrial flutter Symptomatic.  We discussed management options for his atrial flutter including continued conservative management with anticoagulation for stroke prophylaxis, rhythm control strategy using antiarrhythmic drug therapy or rhythm control strategy using ablation.  Given his young age, I do think a rhythm control strategy is warranted.  He does not have evidence of atrial fibrillation so an ablation strategy would target typical atrial flutter only.  We discussed the possibility that after eliminating the atrial flutter circuit that atrial fibrillation could emerge.  We discussed that if this were to happen he would need to be back on anticoagulation and we would need to consider therapy options for atrial fibrillation including ablation versus antiarrhythmic drug therapy.  I have recommended that we pursue atrial flutter ablation followed by loop recorder implant for ongoing surveillance for atrial fibrillation.  The  strategy would allow him to discontinue his anticoagulant 3 months after the flutter ablation to minimize exposure to the blood thinner and its associated risks.  Risk, benefits, and alternatives to EP study and radiofrequency ablation for afib were also discussed in detail today. These risks include but are not limited to stroke, bleeding, vascular damage, tamponade, perforation, damage to the esophagus, lungs, and other structures, pulmonary vein stenosis, worsening renal function, and death. The patient understands these risk and wishes to proceed.  We will therefore proceed with catheter ablation at the next available time.  Carto, ICE, anesthesia are requested for the procedure.    He would also like to change from Eliquis to Xarelto given some fatigue he associates with the morning dose of Eliquis.   2.  Hypertension Controlled.  Continue current medications.  3.  Coronary artery disease No ischemic symptoms.  Total time spent with patient today 65 minutes. This includes reviewing records, evaluating the patient and coordinating care.  Medication Adjustments/Labs and Tests Ordered: Current medicines are reviewed at length with the patient today.  Concerns regarding medicines are outlined above.  Orders Placed This Encounter  Procedures   Basic Metabolic Panel (BMET)   CBC w/Diff   EKG 12-Lead    No orders of the defined types were placed in this encounter.    Signed, Hilton Cork. Quentin Ore, MD, [facility], [facility] 03/23/2021 12:49 PM    Electrophysiology [facility]

## 2021-03-23 ENCOUNTER — Encounter: Payer: Self-pay | Admitting: Cardiology

## 2021-03-23 ENCOUNTER — Ambulatory Visit (INDEPENDENT_AMBULATORY_CARE_PROVIDER_SITE_OTHER): Payer: BC Managed Care – PPO | Admitting: Cardiology

## 2021-03-23 ENCOUNTER — Other Ambulatory Visit: Payer: Self-pay

## 2021-03-23 VITALS — BP 118/76 | HR 64 | Ht 67.0 in | Wt 261.4 lb

## 2021-03-23 DIAGNOSIS — I10 Essential (primary) hypertension: Secondary | ICD-10-CM

## 2021-03-23 DIAGNOSIS — I483 Typical atrial flutter: Secondary | ICD-10-CM

## 2021-03-23 DIAGNOSIS — I2510 Atherosclerotic heart disease of native coronary artery without angina pectoris: Secondary | ICD-10-CM

## 2021-03-23 LAB — BASIC METABOLIC PANEL
BUN/Creatinine Ratio: 22 — ABNORMAL HIGH (ref 9–20)
BUN: 16 mg/dL (ref 6–24)
CO2: 28 mmol/L (ref 20–29)
Calcium: 9.3 mg/dL (ref 8.7–10.2)
Chloride: 96 mmol/L (ref 96–106)
Creatinine, Ser: 0.74 mg/dL — ABNORMAL LOW (ref 0.76–1.27)
Glucose: 117 mg/dL — ABNORMAL HIGH (ref 65–99)
Potassium: 4.7 mmol/L (ref 3.5–5.2)
Sodium: 138 mmol/L (ref 134–144)
eGFR: 106 mL/min/{1.73_m2} (ref >59)

## 2021-03-23 LAB — CBC WITH DIFFERENTIAL/PLATELET
Basophils Absolute: 0.1 10*3/uL (ref 0.0–0.2)
Basos: 1 %
EOS (ABSOLUTE): 0.3 10*3/uL (ref 0.0–0.4)
Eos: 4 %
Hematocrit: 39.7 % (ref 37.5–51.0)
Hemoglobin: 13.7 g/dL (ref 13.0–17.7)
Immature Grans (Abs): 0 10*3/uL (ref 0.0–0.1)
Immature Granulocytes: 1 %
Lymphocytes Absolute: 1.7 10*3/uL (ref 0.7–3.1)
Lymphs: 24 %
MCH: 32.5 pg (ref 26.6–33.0)
MCHC: 34.5 g/dL (ref 31.5–35.7)
MCV: 94 fL (ref 79–97)
Monocytes Absolute: 0.8 10*3/uL (ref 0.1–0.9)
Monocytes: 12 %
Neutrophils Absolute: 4.1 10*3/uL (ref 1.4–7.0)
Neutrophils: 58 %
Platelets: 193 10*3/uL (ref 150–450)
RBC: 4.21 x10E6/uL (ref 4.14–5.80)
RDW: 12.6 % (ref 11.6–15.4)
WBC: 6.9 10*3/uL (ref 3.4–10.8)

## 2021-03-23 NOTE — Patient Instructions (Addendum)
Medication Instructions:  Your physician has recommended you make the following change in your medication:   STOP Eliquis 2.   START taking Xarelto 20 mg- Take one tablet by mouth daily  *If you need a refill on your cardiac medications before your next appointment, please call your pharmacy*  Lab Work: You will get your lab work today:  BMP and CBC   If you have labs (blood work) drawn today and your tests are completely normal, you will receive your results only by: [place] (if you have MyChart) OR A paper copy in the mail If you have any lab test that is abnormal or we need to change your treatment, we will call you to review the results.  Testing/Procedures: Your physician has recommended that you have an ablation. Catheter ablation is a medical procedure used to treat some cardiac arrhythmias (irregular heartbeats). During catheter ablation, a long, thin, flexible tube is put into a blood vessel in your groin (upper thigh), or neck. This tube is called an ablation catheter. It is then guided to your heart through the blood vessel. Radio frequency waves destroy small areas of heart tissue where abnormal heartbeats may cause an arrhythmia to start. Please see the instruction sheet given to you today..  Follow-Up:  SEE INSTRUCTION LETTER   Cardiac electrophysiology: From cell to bedside (7th ed., pp. 5329-9242). [place], [place]: Elsevier.">  Cardiac Ablation Cardiac ablation is a procedure to destroy, or ablate, a small amount of heart tissue in very specific places. The heart has many electrical connections. Sometimes these connections are abnormal and can cause the heart to beat very fast or irregularly. Ablating some of the areas that cause problems can improve the heart's rhythm or return it to normal. Ablation may be done for people who: Have Wolff-Parkinson-White syndrome. Have fast heart rhythms (tachycardia). Have taken medicines for an abnormal heart rhythm  (arrhythmia) that were not effective or caused side effects. Have a high-risk heartbeat that may be life-threatening. During the procedure, a small incision is made in the neck or the groin, and a long, thin tube (catheter) is inserted into the incision and moved to the heart. Small devices (electrodes) on the tip of the catheter will send out electrical currents. A type of X-ray (fluoroscopy) will be used to help guide the catheter and to provide images of the heart. Tell a health care provider about: Any allergies you have. All medicines you are taking, including vitamins, herbs, eye drops, creams, and over-the-counter medicines. Any problems you or family members have had with anesthetic medicines. Any blood disorders you have. Any surgeries you have had. Any medical conditions you have, such as kidney failure. Whether you are pregnant or may be pregnant. What are the risks? Generally, this is a safe procedure. However, problems may occur, including: Infection. Bruising and bleeding at the catheter insertion site. Bleeding into the chest, especially into the sac that surrounds the heart. This is a serious complication. Stroke or blood clots. Damage to nearby structures or organs. Allergic reaction to medicines or dyes. Need for a permanent pacemaker if the normal electrical system is damaged. A pacemaker is a small computer that sends electrical signals to the heart and helps your heart beat normally. The procedure not being fully effective. This may not be recognized until months later. Repeat ablation procedures are sometimes done. What happens before the procedure? Medicines Ask your health care provider about: Changing or stopping your regular medicines. This is especially important if you are taking  diabetes medicines or blood thinners. Taking medicines such as aspirin and ibuprofen. These medicines can thin your blood. Do not take these medicines unless your health care provider  tells you to take them. Taking over-the-counter medicines, vitamins, herbs, and supplements. General instructions Follow instructions from your health care provider about eating or drinking restrictions. Plan to have someone take you home from the hospital or clinic. If you will be going home right after the procedure, plan to have someone with you for 24 hours. Ask your health care provider what steps will be taken to prevent infection. What happens during the procedure?  An IV will be inserted into one of your veins. You will be given a medicine to help you relax (sedative). The skin on your neck or groin will be numbed. An incision will be made in your neck or your groin. A needle will be inserted through the incision and into a large vein in your neck or groin. A catheter will be inserted into the needle and moved to your heart. Dye may be injected through the catheter to help your surgeon see the area of the heart that needs treatment. Electrical currents will be sent from the catheter to ablate heart tissue in desired areas. There are three types of energy that may be used to do this: Heat (radiofrequency energy). Laser energy. Extreme cold (cryoablation). When the tissue has been ablated, the catheter will be removed. Pressure will be held on the insertion area to prevent a lot of bleeding. A bandage (dressing) will be placed over the insertion area. The exact procedure may vary among health care providers and hospitals. What happens after the procedure? Your blood pressure, heart rate, breathing rate, and blood oxygen level will be monitored until you leave the hospital or clinic. Your insertion area will be monitored for bleeding. You will need to lie still for a few hours to ensure that you do not bleed from the insertion area. Do not drive for 24 hours or as long as told by your health care provider. Summary Cardiac ablation is a procedure to destroy, or ablate, a small amount  of heart tissue using an electrical current. This procedure can improve the heart rhythm or return it to normal. Tell your health care provider about any medical conditions you may have and all medicines you are taking to treat them. This is a safe procedure, but problems may occur. Problems may include infection, bruising, damage to nearby organs or structures, or allergic reactions to medicines. Follow your health care provider's instructions about eating and drinking before the procedure. You may also be told to change or stop some of your medicines. After the procedure, do not drive for 24 hours or as long as told by your health care provider. This information is not intended to replace advice given to you by your health care provider. Make sure you discuss any questions you have with your healthcare provider. Document Revised: 07/26/2019 Document Reviewed: 07/26/2019 Elsevier Patient Education  [place].

## 2021-03-23 NOTE — Telephone Encounter (Signed)
I called pt to schedule his sleep study.  He is coming in on 6/29 to get study done and he asked how long for results.  I made him aware can take 2 weeks to get results.  He asked about these disability forms.  He hasn't heard anything and he needs his company to be aware.

## 2021-03-27 ENCOUNTER — Other Ambulatory Visit: Payer: Self-pay | Admitting: Physician Assistant

## 2021-03-27 ENCOUNTER — Telehealth: Payer: Self-pay | Admitting: Primary Care

## 2021-03-27 DIAGNOSIS — I10 Essential (primary) hypertension: Secondary | ICD-10-CM

## 2021-03-27 NOTE — Telephone Encounter (Signed)
[place]

## 2021-03-27 NOTE — Telephone Encounter (Signed)
Patient is returning phone call. Patient phone number is [phone].

## 2021-03-27 NOTE — Telephone Encounter (Signed)
Spoke with pt who states he feels that his leave from work should be extended through 04/18/2021. On Dr. Gentry Roch the extension, if he does pt is requesting proof be faxed to [facility]. Routing to [employer] as FYI. Dr. Chase Caller please advise.

## 2021-03-27 NOTE — Telephone Encounter (Signed)
Left message for patient to call back  

## 2021-03-27 NOTE — Telephone Encounter (Signed)
I will have [place] fax most recent ov note and add that sleep study being done on 6/29 on cover sheet and fax to [employer]

## 2021-03-28 ENCOUNTER — Encounter: Payer: Self-pay | Admitting: *Deleted

## 2021-03-28 ENCOUNTER — Telehealth: Payer: Self-pay | Admitting: Internal Medicine

## 2021-03-28 ENCOUNTER — Ambulatory Visit: Payer: BC Managed Care – PPO

## 2021-03-28 ENCOUNTER — Other Ambulatory Visit: Payer: Self-pay

## 2021-03-28 DIAGNOSIS — R0683 Snoring: Secondary | ICD-10-CM

## 2021-03-28 DIAGNOSIS — G4733 Obstructive sleep apnea (adult) (pediatric): Secondary | ICD-10-CM

## 2021-03-28 NOTE — Telephone Encounter (Signed)
Called and spoke with pt letting him know that MR said it was okay to extend his leave through end of July 2022 and pt verbalized understanding. Was provided fax number as well as pt's case # and WIN number which pt said needed to be on the letter too.  Letter has been faxed to [facility] for pt.nothing further needed.

## 2021-03-28 NOTE — Telephone Encounter (Signed)
Ok to extend till end of July 04/29/21. I had originally wrtten for the [facility] in April 2022

## 2021-03-28 NOTE — Telephone Encounter (Signed)
disregard

## 2021-03-28 NOTE — Telephone Encounter (Signed)
Additional Dr. Chase Caller has written out of work until 04/29/2021 - and Raquel Sarna has faxed letter stating this to [facility] -pr

## 2021-03-29 MED ORDER — TADALAFIL 5 MG PO TABS: 5.0000 mg | ORAL_TABLET | Freq: Every day | ORAL | 1 refills | Status: DC | PRN

## 2021-04-04 DIAGNOSIS — G4733 Obstructive sleep apnea (adult) (pediatric): Secondary | ICD-10-CM

## 2021-04-05 ENCOUNTER — Telehealth (INDEPENDENT_AMBULATORY_CARE_PROVIDER_SITE_OTHER): Payer: BC Managed Care – PPO | Admitting: Psychiatry

## 2021-04-05 ENCOUNTER — Telehealth: Payer: BC Managed Care – PPO | Admitting: Psychiatry

## 2021-04-05 ENCOUNTER — Encounter: Payer: Self-pay | Admitting: Psychiatry

## 2021-04-05 DIAGNOSIS — F411 Generalized anxiety disorder: Secondary | ICD-10-CM

## 2021-04-05 DIAGNOSIS — G4733 Obstructive sleep apnea (adult) (pediatric): Secondary | ICD-10-CM

## 2021-04-05 MED ORDER — TADALAFIL 10 MG PO TABS: 10.0000 mg | ORAL_TABLET | Freq: Every day | ORAL | 1 refills | Status: DC | PRN

## 2021-04-05 MED ORDER — BUSPIRONE HCL 15 MG PO TABS: 15.0000 mg | ORAL_TABLET | Freq: Two times a day (BID) | ORAL | 1 refills | Status: DC

## 2021-04-05 NOTE — Pre-Procedure Instructions (Signed)
Instructed patient on the following items: Arrival time 0730 Nothing to eat or drink after midnight No meds AM of procedure Responsible person to drive you home and stay with you for 24 hrs  Have you missed any doses of anti-coagulant Xarelto- hasn't missed any

## 2021-04-05 NOTE — Progress Notes (Signed)
Ralph Mack [id number] October 31, 1965 55 y.o.  Virtual Visit via Telephone Note  I connected with pt on 04/05/21 at  4:00 PM EDT by telephone and verified that I am speaking with the correct person using two identifiers.   I discussed the limitations, risks, security and privacy concerns of performing an evaluation and management service by telephone and the availability of in person appointments. I also discussed with the patient that there may be a patient responsible charge related to this service. The patient expressed understanding and agreed to proceed.   I discussed the assessment and treatment plan with the patient. The patient was provided an opportunity to ask questions and all were answered. The patient agreed with the plan and demonstrated an understanding of the instructions.   The patient was advised to call back or seek an in-person evaluation if the symptoms worsen or if the condition fails to improve as anticipated.  I provided 30 minutes of non-face-to-face time during this encounter.  The patient was located at home.  The provider was located at [place].   Thayer Headings, PMHNP   Subjective:   Patient ID:  Ralph Mack is a 55 y.o. (DOB 1966-06-25) male.  Chief Complaint:  Chief Complaint  Patient presents with   Follow-up    H/o Anxiety    HPI Ralph Mack presents for follow-up of anxiety. He reports that he stopped smoking after having respiratory issues to include lung nodules. He reports that he has gained about 20 lbs since he stopped smoking. He reports increased appetite. He reports that anxiety has been "ok other than what I have had to deal with."   He reports that he continues to have some dizziness after taking Buspar in the morning. He reports that anxiety has been well-controlled. Denies any panic attacks. Denies depressed mood. He reports that his sleep is "up and down." He reports that he sleeps better when he is on the truck instead  of at home with the dogs. Energy and motivation have been ok. He reports that concentration is consistent with baseline. Denies SI.   He has been out of work since April. He reports that a friend of his committed suicide that he has worked with. Another friend found friend that committed suicide. Mother-in-law and sister-in-law are now living with them.   Review of Systems:  Review of Systems  Respiratory:         Reports improved respiratory s/s since he stopped smoking  Musculoskeletal:  Negative for gait problem.  Psychiatric/Behavioral:         Please refer to HPI   Medications: I have reviewed the patient's current medications.  Current Outpatient Medications  Medication Sig Dispense Refill   acetaminophen (TYLENOL) 650 MG CR tablet Take 1,300-1,950 mg by mouth 2 (two) times daily as needed for pain.     allopurinol (ZYLOPRIM) 300 MG tablet Take 1 tablet (300 mg total) by mouth daily. 90 tablet 1   bismuth subsalicylate (PEPTO BISMOL) 262 MG chewable tablet Chew 524 mg by mouth daily as needed for indigestion.     Carboxymethylcellulose Sodium (ARTIFICIAL TEARS OP) Place 1 drop into both eyes daily as needed (dry eyes).     Cholecalciferol (VITAMIN D3) 5000 units TABS 5,000 IU OTC vitamin D3 daily. (Patient taking differently: Take 5,000 Units by mouth daily.) 90 tablet 3   Dextrose-Fructose-Sod Citrate (NAUZENE) [phone] MG CHEW Chew 1 tablet by mouth daily as needed (nausea).     diltiazem (CARDIZEM CD) 120 MG 24  hr capsule Take 1 capsule (120 mg total) by mouth daily. 90 capsule 3   diphenhydrAMINE HCl, Sleep, 50 MG CAPS Take 50 mg by mouth at bedtime.     EUTHYROX 50 MCG tablet Take 1 tablet by mouth once daily (Patient taking differently: Take 50 mcg by mouth daily.) 90 tablet 0   fexofenadine (ALLEGRA) 180 MG tablet Take 180 mg by mouth daily.     fluticasone (FLONASE) 50 MCG/ACT nasal spray Place 2 sprays into the nose daily.     hydrochlorothiazide (MICROZIDE) 12.5 MG  capsule Take 1 capsule by mouth once daily (Patient taking differently: Take 12.5 mg by mouth daily.) 90 capsule 0   losartan (COZAAR) 50 MG tablet Take 50 mg by mouth daily.     Potassium 99 MG TABS Take 297-396 mg by mouth daily.     rivaroxaban (XARELTO) 20 MG TABS tablet Take 20 mg by mouth every evening.     Sodium Chloride-Sodium Bicarb (AYR SALINE NASAL RINSE NA) Place 1 Dose into the nose daily as needed (congestion).     tadalafil (CIALIS) 5 MG tablet Take 1 tablet (5 mg total) by mouth daily as needed for erectile dysfunction. 10 tablet 1   albuterol (PROAIR HFA) 108 (90 Base) MCG/ACT inhaler Inhale 2 puffs into the lungs every 4 (four) hours as needed for wheezing or shortness of breath. 18 g 3   apixaban (ELIQUIS) 5 MG TABS tablet Take 1 tablet (5 mg total) by mouth 2 (two) times daily. (Patient not taking: No sig reported) 60 tablet 2   busPIRone (BUSPAR) 15 MG tablet Take 1 tablet (15 mg total) by mouth 2 (two) times daily. 180 tablet 1   nicotine (NICODERM CQ - DOSED IN MG/24 HOURS) 21 mg/24hr patch Place 1 patch (21 mg total) onto the skin daily. (Patient not taking: No sig reported) 28 patch 0   oxymetazoline (AFRIN) 0.05 % nasal spray Place 1 spray into both nostrils 2 (two) times daily as needed for congestion. (Patient not taking: Reported on 04/05/2021)     pantoprazole (PROTONIX) 40 MG tablet Take 1 tablet (40 mg total) by mouth 2 (two) times daily before a meal for 10 days, THEN 1 tablet (40 mg total) daily. 50 tablet 0   No current facility-administered medications for this visit.    Medication Side Effects: Other: Mild dizziness after taking morning dose of Buspar  Allergies: No Known Allergies  Past Medical History:  Diagnosis Date   Allergy    Arthritis    Hx of colonic polyp 11/13/2016   10/2016 7 mm ssp/a - recall 2023   Hypertension    Hypothyroidism    Lymphocytic colitis 04/19/2019   Mild CAD 2009   Obesity    Paroxysmal atrial flutter (HCC)    Prediabetes     Recurrent upper respiratory infection (URI)     Family History  Problem Relation Age of Onset   Asthma Mother    COPD Mother    Cancer Mother        Skin   Cancer - Other Mother        bone, liver, breast, lung   Emphysema Mother    Heart disease Father        Pacemaker   Parkinson's disease Father    Depression Father    Depression Daughter    OCD Daughter    Alcohol abuse Maternal Grandmother    Schizophrenia Maternal Grandmother    Diabetes Paternal Grandfather    Kidney disease  Paternal Grandfather    Colon polyps Neg Hx    Colon cancer Neg Hx    Esophageal cancer Neg Hx    Stomach cancer Neg Hx    Rectal cancer Neg Hx     Social History   Socioeconomic History   Marital status: Married    Spouse name: Not on file   Number of children: 2   Years of education: Not on file   Highest education level: Not on file  Occupational History   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     Social Determinants of Health   Financial Resource Strain: Not on file  Food Insecurity: Not on file  Transportation Needs: Not on file  Physical Activity: Not on file  Stress: Not on file  Social Connections: Not on file  Intimate Partner Violence: Not on file    Past Medical History, Surgical history, Social history, and Family history were reviewed and updated as appropriate.   Please see review of systems for  further details on the patient's review from today.   Objective:   Physical Exam:  There were no vitals taken for this visit.  Physical Exam Neurological:     Mental Status: He is alert and oriented to person, place, and time.     Cranial Nerves: No dysarthria.  Psychiatric:        Attention and Perception: Attention and perception normal.        Mood and Affect: Mood normal.        Speech: Speech normal.        Behavior: Behavior is cooperative.        Thought Content: Thought content normal. Thought content is not paranoid or delusional. Thought content does not include homicidal or suicidal ideation. Thought content does not include homicidal or suicidal plan.        Cognition and Memory: Cognition and memory normal.        Judgment: Judgment normal.     Comments: Insight intact    Lab Review:     Component Value Date/Time   NA 138 03/23/2021 1046   K 4.7 03/23/2021 1046   CL 96 03/23/2021 1046   CO2 28 03/23/2021 1046   GLUCOSE 117 (H) 03/23/2021 1046   GLUCOSE 130 (H) 01/10/2021 0449   BUN 16 03/23/2021 1046   CREATININE 0.74 (L) 03/23/2021 1046   CALCIUM 9.3 03/23/2021 1046   PROT 6.2 01/16/2021 1531   ALBUMIN 4.2 01/16/2021 1531   AST 17 01/16/2021 1531   ALT 31 01/16/2021 1531   ALKPHOS 62 01/16/2021 1531   BILITOT 0.2 01/16/2021 1531   GFRNONAA >60 01/10/2021 0449   GFRAA 121 08/07/2020 1100       Component Value Date/Time   WBC 6.9 03/23/2021 1046   WBC  8.4 01/10/2021 0449   RBC 4.21 03/23/2021 1046   RBC 4.57 01/10/2021 0449   HGB 13.7 03/23/2021 1046   HCT 39.7 03/23/2021 1046   PLT 193 03/23/2021 1046   MCV 94 03/23/2021 1046   MCH 32.5 03/23/2021 1046   MCH 31.9 01/10/2021 0449   MCHC 34.5 03/23/2021 1046   MCHC 32.0 01/10/2021 0449   RDW 12.6 03/23/2021 1046   LYMPHSABS 1.7 03/23/2021 1046   MONOABS 0.8 01/06/2021 0932   EOSABS 0.3 03/23/2021 1046   BASOSABS 0.1 03/23/2021 1046    No results found for: POCLITH, LITHIUM   No results  found for: PHENYTOIN, PHENOBARB, VALPROATE, CBMZ   .res Assessment: Plan:   Patient seen for 25 minutes and time spent discussing response to BuSpar and mild occasional side effects. Will continue BuSpar 15 mg twice daily for anxiety since patient reports that has been helpful  BuSpar for anxiety signs and symptoms.  He reports mild transient dizziness after taking morning dose of BuSpar is outweighed by benefits. Patient to follow-up in 6 months or sooner if clinically indicated. Patient advised to contact office with any questions, adverse effects, or acute worsening in signs and symptoms.   Sonya was seen today for follow-up.  Diagnoses and all orders for this visit:  Generalized anxiety disorder -     busPIRone (BUSPAR) 15 MG tablet; Take 1 tablet (15 mg total) by mouth 2 (two) times daily.   Please see After Visit Summary for patient specific instructions.  Future Appointments  Date Time Provider [place]  04/24/2021  9:30 AM [name], PA-C [facility] None  05/04/2021  3:15 PM Vickie Epley, MD [facility]  06/25/2021 10:00 AM Deneise Lever, MD [facility] None  07/17/2021 11:15 AM Hollice Espy, MD [facility] None  10/05/2021 11:00 AM Thayer Headings, PMHNP [facility] None    No orders of the defined types were placed in this encounter.     -------------------------------

## 2021-04-05 NOTE — Telephone Encounter (Signed)
Received Mychart message from patient asking about his HST results. Per his chart, his HST was completed on 03/28/21.   Dr. Annamaria Boots, can you please advise? Thanks.

## 2021-04-06 ENCOUNTER — Ambulatory Visit (HOSPITAL_COMMUNITY)
Admission: RE | Admit: 2021-04-06 | Discharge: 2021-04-06 | Disposition: A | Discharge status: Home / Self Care | Payer: BC Managed Care – PPO | Attending: Cardiology | Admitting: Cardiology

## 2021-04-06 ENCOUNTER — Ambulatory Visit (HOSPITAL_COMMUNITY): Payer: BC Managed Care – PPO | Admitting: Anesthesiology

## 2021-04-06 ENCOUNTER — Other Ambulatory Visit: Payer: Self-pay

## 2021-04-06 ENCOUNTER — Encounter (HOSPITAL_COMMUNITY): Payer: Self-pay | Admitting: Cardiology

## 2021-04-06 ENCOUNTER — Encounter (HOSPITAL_COMMUNITY)
Admission: RE | Disposition: A | Discharge status: Home / Self Care | Payer: Self-pay | Source: Home / Self Care | Attending: Cardiology

## 2021-04-06 DIAGNOSIS — Z8249 Family history of ischemic heart disease and other diseases of the circulatory system: Secondary | ICD-10-CM | POA: Diagnosis not present

## 2021-04-06 DIAGNOSIS — I10 Essential (primary) hypertension: Secondary | ICD-10-CM | POA: Insufficient documentation

## 2021-04-06 DIAGNOSIS — E669 Obesity, unspecified: Secondary | ICD-10-CM | POA: Insufficient documentation

## 2021-04-06 DIAGNOSIS — Z79899 Other long term (current) drug therapy: Secondary | ICD-10-CM | POA: Diagnosis not present

## 2021-04-06 DIAGNOSIS — Z87891 Personal history of nicotine dependence: Secondary | ICD-10-CM | POA: Diagnosis not present

## 2021-04-06 DIAGNOSIS — R7303 Prediabetes: Secondary | ICD-10-CM | POA: Diagnosis not present

## 2021-04-06 DIAGNOSIS — I483 Typical atrial flutter: Secondary | ICD-10-CM | POA: Diagnosis not present

## 2021-04-06 DIAGNOSIS — I2510 Atherosclerotic heart disease of native coronary artery without angina pectoris: Secondary | ICD-10-CM | POA: Insufficient documentation

## 2021-04-06 DIAGNOSIS — Z7901 Long term (current) use of anticoagulants: Secondary | ICD-10-CM | POA: Diagnosis not present

## 2021-04-06 DIAGNOSIS — I4892 Unspecified atrial flutter: Secondary | ICD-10-CM | POA: Diagnosis not present

## 2021-04-06 DIAGNOSIS — Z86010 Personal history of colonic polyps: Secondary | ICD-10-CM | POA: Diagnosis not present

## 2021-04-06 DIAGNOSIS — Z6841 Body Mass Index (BMI) 40.0 and over, adult: Secondary | ICD-10-CM | POA: Diagnosis not present

## 2021-04-06 HISTORY — PX: A-FLUTTER ABLATION: EP1230

## 2021-04-06 SURGERY — A-FLUTTER ABLATION
Anesthesia: General

## 2021-04-06 MED ORDER — HEPARIN (PORCINE) IN NACL 1000-0.9 UT/500ML-% IV SOLN
INTRAVENOUS | Status: DC | PRN
  Administered 2021-04-06: 500 mL

## 2021-04-06 MED ORDER — HEPARIN (PORCINE) IN NACL 1000-0.9 UT/500ML-% IV SOLN
INTRAVENOUS | Status: AC
  Filled 2021-04-06: qty 500

## 2021-04-06 MED ORDER — PROPOFOL 10 MG/ML IV BOLUS
INTRAVENOUS | Status: DC | PRN
  Administered 2021-04-06: 150 mg via INTRAVENOUS

## 2021-04-06 MED ORDER — ISOPROTERENOL HCL 0.2 MG/ML IJ SOLN
INTRAVENOUS | Status: DC | PRN
  Administered 2021-04-06: 4 ug/min via INTRAVENOUS

## 2021-04-06 MED ORDER — SODIUM CHLORIDE 0.9% FLUSH
3.0000 mL | INTRAVENOUS | Status: DC | PRN
Start: 2021-04-06 — End: 2021-04-06

## 2021-04-06 MED ORDER — MIDAZOLAM HCL 2 MG/2ML IJ SOLN
INTRAMUSCULAR | Status: DC | PRN
  Administered 2021-04-06: 2 mg via INTRAVENOUS

## 2021-04-06 MED ORDER — LIDOCAINE 2% (20 MG/ML) 5 ML SYRINGE
INTRAMUSCULAR | Status: DC | PRN
  Administered 2021-04-06: 80 mg via INTRAVENOUS

## 2021-04-06 MED ORDER — HEPARIN SODIUM (PORCINE) 1000 UNIT/ML IJ SOLN
INTRAMUSCULAR | Status: AC
  Filled 2021-04-06: qty 1

## 2021-04-06 MED ORDER — ACETAMINOPHEN 325 MG PO TABS
650.0000 mg | ORAL_TABLET | ORAL | Status: DC | PRN
  Administered 2021-04-06: 650 mg via ORAL
  Filled 2021-04-06 (×3): qty 2

## 2021-04-06 MED ORDER — ISOPROTERENOL HCL 0.2 MG/ML IJ SOLN
INTRAMUSCULAR | Status: AC
  Filled 2021-04-06: qty 5

## 2021-04-06 MED ORDER — ONDANSETRON HCL 4 MG/2ML IJ SOLN: 4.0000 mg | Freq: Four times a day (QID) | INTRAMUSCULAR | Status: DC | PRN

## 2021-04-06 MED ORDER — FENTANYL CITRATE (PF) 100 MCG/2ML IJ SOLN
INTRAMUSCULAR | Status: DC | PRN
  Administered 2021-04-06: 100 ug via INTRAVENOUS

## 2021-04-06 MED ORDER — DEXAMETHASONE SODIUM PHOSPHATE 10 MG/ML IJ SOLN
INTRAMUSCULAR | Status: DC | PRN
  Administered 2021-04-06: 10 mg via INTRAVENOUS

## 2021-04-06 MED ORDER — ONDANSETRON HCL 4 MG/2ML IJ SOLN
INTRAMUSCULAR | Status: DC | PRN
  Administered 2021-04-06: 4 mg via INTRAVENOUS

## 2021-04-06 MED ORDER — SODIUM CHLORIDE 0.9% FLUSH: 3.0000 mL | Freq: Two times a day (BID) | INTRAVENOUS | Status: DC

## 2021-04-06 MED ORDER — HEPARIN SODIUM (PORCINE) 1000 UNIT/ML IJ SOLN
INTRAMUSCULAR | Status: DC | PRN
  Administered 2021-04-06: 1000 [IU] via INTRAVENOUS

## 2021-04-06 MED ORDER — SODIUM CHLORIDE 0.9 % IV SOLN: 250.0000 mL | INTRAVENOUS | Status: DC | PRN

## 2021-04-06 MED ORDER — SODIUM CHLORIDE 0.9 % IV SOLN: INTRAVENOUS | Status: DC

## 2021-04-06 MED ORDER — ROCURONIUM BROMIDE 10 MG/ML (PF) SYRINGE
PREFILLED_SYRINGE | INTRAVENOUS | Status: DC | PRN
  Administered 2021-04-06: 60 mg via INTRAVENOUS
  Administered 2021-04-06: 30 mg via INTRAVENOUS
  Administered 2021-04-06: 10 mg via INTRAVENOUS

## 2021-04-06 MED ORDER — SUGAMMADEX SODIUM 200 MG/2ML IV SOLN
INTRAVENOUS | Status: DC | PRN
  Administered 2021-04-06: 300 mg via INTRAVENOUS

## 2021-04-06 SURGICAL SUPPLY — 12 items
CATH SMTCH THERMOCOOL SF DF (CATHETERS) ×2 IMPLANT
CATH SOUNDSTAR ECO 8FR (CATHETERS) ×2 IMPLANT
CATH WEBSTER BI DIR CS D-F CRV (CATHETERS) ×2 IMPLANT
CLOSURE PERCLOSE PROSTYLE (VASCULAR PRODUCTS) ×6 IMPLANT
PACK EP LATEX FREE (CUSTOM PROCEDURE TRAY) ×2
PACK EP LF (CUSTOM PROCEDURE TRAY) ×1 IMPLANT
PAD PRO RADIOLUCENT 2001M-C (PAD) ×2 IMPLANT
PATCH CARTO3 (PAD) ×2 IMPLANT
SHEATH PINNACLE 8F 10CM (SHEATH) ×4 IMPLANT
SHEATH PINNACLE 9F 10CM (SHEATH) ×2 IMPLANT
SHEATH PROBE COVER 6X72 (BAG) ×2 IMPLANT
TUBING SMART ABLATE COOLFLOW (TUBING) ×2 IMPLANT

## 2021-04-06 NOTE — Anesthesia Preprocedure Evaluation (Addendum)
Anesthesia Evaluation  Patient identified by MRN, date of birth, ID band Patient awake    Reviewed: Allergy & Precautions, NPO status , Patient's Chart, lab work & pertinent test results, reviewed documented beta blocker date and time   Airway Mallampati: III  TM Distance: >3 FB Neck ROM: Full    Dental  (+) Teeth Intact, Dental Advisory Given, Caps, Missing,    Pulmonary COPD,  COPD inhaler, Recent URI , Resolved, former smoker,  Multiple pulmonary nodules  Uses O2 @ night  Had recent Sleep study, Dr. Annamaria Boots to evaluate   Pulmonary exam normal breath sounds clear to auscultation       Cardiovascular hypertension, Pt. on medications and Pt. on home beta blockers + CAD  Normal cardiovascular exam+ dysrhythmias Atrial Fibrillation  Rhythm:Regular Rate:Normal  Mild CAD 2009  EKG 03/23/21 NSR, sinus arrhytmia   Echo 01/07/21 1. Left ventricular ejection fraction, by estimation, is 60 to 65%. Left ventricular ejection fraction by 3D volume is 67 %. The left ventricle has normal function. The left ventricle has no regional wall motion abnormalities. There is mild-to-moderate concentric left ventricular hypertrophy. Left ventricular diastolic parameters are consistent with Grade II diastolic dysfunction (pseudonormalization). 2. Right ventricular systolic function is normal. The right ventricular size is normal. 3. The mitral valve is normal in structure. Trivial mitral valve regurgitation. No evidence of mitral stenosis. 4. The aortic valve is tricuspid. Aortic valve regurgitation is not visualized. No aortic stenosis is present. 5. Aortic dilatation noted. There is mild dilatation of the ascending aorta, measuring 41 mm. 6. The inferior vena cava is dilated in size with <50% respiratory variability, suggesting right atrial pressure of 15 mmHg.   Neuro/Psych PSYCHIATRIC DISORDERS Anxiety negative neurological ROS      GI/Hepatic negative GI ROS, Neg liver ROS,   Endo/Other  Hypothyroidism Morbid obesityHyperlipidemia Pre diabetes  Renal/GU negative Renal ROS Bladder dysfunction      Musculoskeletal  (+) Arthritis , Osteoarthritis,    Abdominal (+) + obese,   Peds  Hematology Xarelto therapy   Anesthesia Other Findings   Reproductive/Obstetrics                           Anesthesia Physical Anesthesia Plan  ASA: 3  Anesthesia Plan: General   Post-op Pain Management:    Induction: Intravenous  PONV Risk Score and Plan: 2 and Treatment may vary due to age or medical condition and Ondansetron  Airway Management Planned: Oral ETT  Additional Equipment:   Intra-op Plan:   Post-operative Plan: Extubation in OR  Informed Consent: I have reviewed the patients History and Physical, chart, labs and discussed the procedure including the risks, benefits and alternatives for the proposed anesthesia with the patient or authorized representative who has indicated his/her understanding and acceptance.     Dental advisory given  Plan Discussed with: CRNA and Anesthesiologist  Anesthesia Plan Comments:        Anesthesia Quick Evaluation

## 2021-04-06 NOTE — Discharge Instructions (Addendum)
Post procedure care instructions No driving for 4 days. No lifting over 5 lbs for 1 week. No vigorous or sexual activity for 1 week. You may return to work/your usual activities on 04/14/21. Keep procedure site clean & dry. If you notice increased pain, swelling, bleeding or pus, call/return!  You may shower after 24 hours, but no soaking in baths/hot tubs/pools for 1 week.   Cardiac Ablation, Care After  This sheet gives you information about how to care for yourself after your procedure. Your health care provider may also give you more specific instructions. If you have problems or questions, contact your health care provider. What can I expect after the procedure? After the procedure, it is common to have: Bruising around your puncture site. Tenderness around your puncture site. Skipped heartbeats. Tiredness (fatigue).  Follow these instructions at home: Puncture site care  Follow instructions from your health care provider about how to take care of your puncture site. Make sure you: If present, leave stitches (sutures), skin glue, or adhesive strips in place. These skin closures may need to stay in place for up to 2 weeks. If adhesive strip edges start to loosen and curl up, you may trim the loose edges. Do not remove adhesive strips completely unless your health care provider tells you to do that. If a large square bandage is present, this may be removed 24 hours after surgery.  Check your puncture site every day for signs of infection. Check for: Redness, swelling, or pain. Fluid or blood. If your puncture site starts to bleed, lie down on your back, apply firm pressure to the area, and contact your health care provider. Warmth. Pus or a bad smell. Driving Do not drive for at least 4 days after your procedure or however long your health care provider recommends. (Do not resume driving if you have previously been instructed not to drive for other health reasons.) Do not drive or use  heavy machinery while taking prescription pain medicine. Activity Avoid activities that take a lot of effort for at least 7 days after your procedure. Do not lift anything that is heavier than 5 lb (4.5 kg) for one week.  No sexual activity for 1 week.  Return to your normal activities as told by your health care provider. Ask your health care provider what activities are safe for you. General instructions Take over-the-counter and prescription medicines only as told by your health care provider. Do not use any products that contain nicotine or tobacco, such as cigarettes and e-cigarettes. If you need help quitting, ask your health care provider. You may shower after 24 hours, but Do not take baths, swim, or use a hot tub for 1 week.  Do not drink alcohol for 24 hours after your procedure. Keep all follow-up visits as told by your health care provider. This is important. Contact a health care provider if: You have redness, mild swelling, or pain around your puncture site. You have fluid or blood coming from your puncture site that stops after applying firm pressure to the area. Your puncture site feels warm to the touch. You have pus or a bad smell coming from your puncture site. You have a fever. You have chest pain or discomfort that spreads to your neck, jaw, or arm. You are sweating a lot. You feel nauseous. You have a fast or irregular heartbeat. You have shortness of breath. You are dizzy or light-headed and feel the need to lie down. You have pain or numbness in  the arm or leg closest to your puncture site. Get help right away if: Your puncture site suddenly swells. Your puncture site is bleeding and the bleeding does not stop after applying firm pressure to the area. These symptoms may represent a serious problem that is an emergency. Do not wait to see if the symptoms will go away. Get medical help right away. Call your local emergency services (911 in the [place] Do not drive  yourself to the hospital. Summary After the procedure, it is normal to have bruising and tenderness at the puncture site in your groin, neck, or forearm. Check your puncture site every day for signs of infection. Get help right away if your puncture site is bleeding and the bleeding does not stop after applying firm pressure to the area. This is a medical emergency. This information is not intended to replace advice given to you by your health care provider. Make sure you discuss any questions you have with your health care provider.

## 2021-04-06 NOTE — Anesthesia Postprocedure Evaluation (Signed)
Anesthesia Post Note  Patient: Ralph Mack  Procedure(s) Performed: A-FLUTTER ABLATION     Patient location during evaluation: PACU Anesthesia Type: General Level of consciousness: awake and alert and oriented Pain management: pain level controlled Vital Signs Assessment: post-procedure vital signs reviewed and stable Respiratory status: spontaneous breathing, nonlabored ventilation and respiratory function stable Cardiovascular status: blood pressure returned to baseline and stable Postop Assessment: no apparent nausea or vomiting Anesthetic complications: no   No notable events documented.  Last Vitals:  Vitals:   04/06/21 1311 04/06/21 1341  BP: (!) 147/77 135/78  Pulse: 74 72  Resp: 17 18  Temp: (!) 36.1 C 36.5 C  SpO2:      Last Pain:  Vitals:   04/06/21 1341  TempSrc: Temporal  PainSc: 0-No pain                 Bernardo Brayman A.

## 2021-04-06 NOTE — Telephone Encounter (Signed)
Sleep study showed severe obstructive sleep apnea averaging 60 apneas/ hour with low oxygen levels.  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  He needs ov for CPAP f/u per insurance regs in 31-90 days after getting his machine.

## 2021-04-06 NOTE — Anesthesia Procedure Notes (Signed)
Procedure Name: Intubation Date/Time: 04/06/2021 10:31 AM Performed by: Griffin Dakin, CRNA Pre-anesthesia Checklist: Patient identified, Emergency Drugs available, Suction available and Patient being monitored Patient Re-evaluated:Patient Re-evaluated prior to induction Oxygen Delivery Method: Circle system utilized Preoxygenation: Pre-oxygenation with 100% oxygen Induction Type: IV induction Ventilation: Mask ventilation without difficulty Laryngoscope Size: Mac and 4 Grade View: Grade I Tube type: Oral Tube size: 7.5 mm Number of attempts: 1 Airway Equipment and Method: Stylet and Oral airway Placement Confirmation: ETT inserted through vocal cords under direct vision, positive ETCO2 and breath sounds checked- equal and bilateral Secured at: 23 cm Tube secured with: Tape Dental Injury: Teeth and Oropharynx as per pre-operative assessment

## 2021-04-06 NOTE — Transfer of Care (Signed)
Immediate Anesthesia Transfer of Care Note  Patient: Ralph Mack  Procedure(s) Performed: A-FLUTTER ABLATION  Patient Location: Cath Lab  Anesthesia Type:General  Level of Consciousness: awake, alert  and oriented  Airway & Oxygen Therapy: Patient Spontanous Breathing and Patient connected to nasal cannula oxygen  Post-op Assessment: Report given to RN and Post -op Vital signs reviewed and stable  Post vital signs: Reviewed and stable  Last Vitals:  Vitals Value Taken Time  BP    Temp    Pulse    Resp    SpO2      Last Pain:  Vitals:   04/06/21 0801  TempSrc:   PainSc: 0-No pain         Complications: No notable events documented.

## 2021-04-06 NOTE — Interval H&P Note (Signed)
History and Physical Interval Note:  04/06/2021 9:51 AM  Ralph Mack  has presented today for surgery, with the diagnosis of a flutter.  The various methods of treatment have been discussed with the patient and family. After consideration of risks, benefits and other options for treatment, the patient has consented to  Procedure(s): A-FLUTTER ABLATION (N/A) as a surgical intervention.  The patient's history has been reviewed, patient examined, no change in status, stable for surgery.  I have reviewed the patient's chart and labs.  Questions were answered to the patient's satisfaction.     Sobia Karger T Pryce Folts

## 2021-04-09 ENCOUNTER — Encounter (HOSPITAL_COMMUNITY): Payer: Self-pay | Admitting: Cardiology

## 2021-04-09 MED ORDER — RIVAROXABAN 20 MG PO TABS: 20.0000 mg | ORAL_TABLET | Freq: Every evening | ORAL | 11 refills | Status: DC

## 2021-04-11 NOTE — Telephone Encounter (Signed)
Called and spoke with patient. He was under the impression that the CPAP had already been ordered on 04/06/21. I advised him that the message that sent back to him simply asked if he was ok with [place] ordering the cpap machine.Order for the CPAP has been placed.   Nothing further needed.

## 2021-04-13 ENCOUNTER — Telehealth: Payer: Self-pay

## 2021-04-13 ENCOUNTER — Telehealth: Payer: Self-pay | Admitting: Internal Medicine

## 2021-04-13 NOTE — Telephone Encounter (Signed)
This was sent to [facility]

## 2021-04-13 NOTE — Telephone Encounter (Signed)
ATC, left detailed VM in accordance with DPR regarding the referral for CPAP was sent to [facility] and the patient can call them for questions on when it will be delivered and the cost etc. Left callback number for anymore questions/concerns. Nothing further needed.

## 2021-04-13 NOTE — Telephone Encounter (Signed)
Spoke with pt who was wondering when C-Pap order was placed. RN informed him order was placed on 04/11/2021. Pt is also questions which DME the order was placed with. PCCs could you please advise on this. Please route message back to triage when finished with it.

## 2021-04-16 ENCOUNTER — Encounter: Payer: Self-pay | Admitting: Internal Medicine

## 2021-04-16 ENCOUNTER — Telehealth: Payer: Self-pay | Admitting: Internal Medicine

## 2021-04-16 NOTE — Telephone Encounter (Signed)
Continued...  would like the prescription emailed to [email or web address]

## 2021-04-16 NOTE — Telephone Encounter (Signed)
Hello Dr. Annamaria Boots, please advise on mychart below. Thank you!  I was wondering if someone can fax an extension for my leave of absence until August 20th,2022 to my LOA claim at [place]. I am working on getting my CPAP machine sent through [employer], and will need to learn how to use and set up. I also will still need to get a DOT Physical in this timeframe. The fax# is [phone]. my WIN# [phone] and my claim# [id number]

## 2021-04-16 NOTE — Telephone Encounter (Signed)
Ok to extend his LOA until August 20 as requested.

## 2021-04-16 NOTE — Telephone Encounter (Signed)
Patrice please advise.

## 2021-04-16 NOTE — Telephone Encounter (Signed)
Called and spoke with pt and he is aware that we are able to fax the order over for him.  Will forward to the [facility] pool so this can be done.  PCC's please fax this order.

## 2021-04-17 NOTE — Telephone Encounter (Addendum)
Called pt to get fax # due to digit missing from phone note.  He states fax # is [phone].  He asked for order to be sent to [facility] [employer] attn Williams her phone # is [phone].  He states they will not file ins - cpap will be given to him.  I faxed order, sleep study and ov note.  Nothing further needed.

## 2021-04-18 ENCOUNTER — Telehealth: Payer: Self-pay | Admitting: Internal Medicine

## 2021-04-18 ENCOUNTER — Encounter: Payer: Self-pay | Admitting: Internal Medicine

## 2021-04-18 NOTE — Telephone Encounter (Signed)
Please see msg below regarding CPAP order and advise.  Lulu Riding; Edson Snowball, Gaylene Brooks Hello,   I received this order and processed as a priority per the notes. The order has processed but is on hold due to the limited supply or Resmed cpap units.  If the md is willing to write the order for Luna cpap and the patient is willing to take the Luna cpap that COULD speed up the setup time. Let me know if this is a option and I will let our RT team know.  Until them patient is on the waiting list for a Resmed unit and will be provided one once we have one available.   Thank you,   Demetrius Charity

## 2021-04-18 NOTE — Telephone Encounter (Signed)
Dr. Annamaria Boots, please advise if you are ok with using a Luna machine instead of a ResMed machine. Thanks!

## 2021-04-19 NOTE — Telephone Encounter (Signed)
Tried calling pt to see if he is okay with this, line busy x 2

## 2021-04-19 NOTE — Telephone Encounter (Signed)
Ok with Ralph Mack. Just need to make sure we can get downloads.

## 2021-04-19 NOTE — Telephone Encounter (Signed)
Nothing noted in message. Will close encounter.  

## 2021-04-20 ENCOUNTER — Encounter: Payer: Self-pay | Admitting: Internal Medicine

## 2021-04-20 DIAGNOSIS — G4733 Obstructive sleep apnea (adult) (pediatric): Secondary | ICD-10-CM

## 2021-04-20 HISTORY — DX: Obstructive sleep apnea (adult) (pediatric): G47.33

## 2021-04-20 NOTE — Telephone Encounter (Signed)
Called and spoke with patient, he states that he already has a machine on it's way to him through the company he works for [employer]).  He missed the delivery for yesterday, but should receive it sometime today.  The machine is called [employer] and it is provided free of charge by his employer since his deductible has already been met for this year.  He gave me a contact # for Cory Munch that he has been working with to get the machine.  She can be reached at [phone] ext:  [phone].  The fax # is [phone] and the e-mail address is [email or web address].  I let him know I would give her a call to see how we would go about getting the download information from the machine.  Called [place] and spoke with [facility], she advised that we can call and request a download and they can fax it to [place].  I provided our fax # and that the fax should be to the attention of Dr. Annamaria Boots.  They have everything they need as far as setting, sleep study and history.  Dr. Annamaria Boots, Patient is to receive a CPAP machine through [facility] today and we can call them to get the download and they will fax it to our office.  I verified that they do not need anything from our office to get him started on the CPAP.

## 2021-04-24 ENCOUNTER — Encounter: Payer: Self-pay | Admitting: Physician Assistant

## 2021-04-24 ENCOUNTER — Other Ambulatory Visit: Payer: Self-pay

## 2021-04-24 ENCOUNTER — Ambulatory Visit (INDEPENDENT_AMBULATORY_CARE_PROVIDER_SITE_OTHER): Payer: BC Managed Care – PPO | Admitting: Physician Assistant

## 2021-04-24 VITALS — BP 113/78 | HR 61 | Temp 98.7°F | Ht 67.0 in | Wt 266.1 lb

## 2021-04-24 DIAGNOSIS — I4892 Unspecified atrial flutter: Secondary | ICD-10-CM

## 2021-04-24 DIAGNOSIS — Z716 Tobacco abuse counseling: Secondary | ICD-10-CM

## 2021-04-24 DIAGNOSIS — F39 Unspecified mood [affective] disorder: Secondary | ICD-10-CM

## 2021-04-24 DIAGNOSIS — F1020 Alcohol dependence, uncomplicated: Secondary | ICD-10-CM

## 2021-04-24 DIAGNOSIS — G4733 Obstructive sleep apnea (adult) (pediatric): Secondary | ICD-10-CM | POA: Diagnosis not present

## 2021-04-24 DIAGNOSIS — I10 Essential (primary) hypertension: Secondary | ICD-10-CM

## 2021-04-24 DIAGNOSIS — H60543 Acute eczematoid otitis externa, bilateral: Secondary | ICD-10-CM

## 2021-04-24 MED ORDER — HYDROCORTISONE-ACETIC ACID 1-2 % OT SOLN: 3.0000 [drp] | Freq: Two times a day (BID) | OTIC | 0 refills | Status: DC

## 2021-04-24 NOTE — Telephone Encounter (Signed)
Dr. Annamaria Boots please advise on the follow My Chart message:   I was wondering if someone could contact [facility] and get the oxygen tank picked up and stop service asap! I don't want another bill. I tried calling and there was a 42 minute wait. They also said they would need a Doctor's permission. Thanks  Thank you

## 2021-04-24 NOTE — Telephone Encounter (Signed)
Need for home oxygen during the daytime was documented walk test on 02/20/21. CPAP will keep his airway open while he sleeps, but can't help in the daytime.  What is the status of his Disability application? We need documentation download on his CPAP to see what that is actually doing. If he cannot afford oxygen now, then ok to dc, but we will need to retest him and possibly re-order it at next visit.

## 2021-04-24 NOTE — Patient Instructions (Signed)
Managing the Challenge of Quitting Smoking Quitting smoking is a physical and mental challenge. You will face cravings, withdrawal symptoms, and temptation. Before quitting, work with your health care provider to make a plan that can help you manage quitting. Preparation canhelp you quit and keep you from giving in. How to manage lifestyle changes Managing stress Stress can make you want to smoke, and wanting to smoke may cause stress. It is important to find ways to manage your stress. You might try some of the following: Practice relaxation techniques. Breathe slowly and deeply, in through your nose and out through your mouth. Listen to music. Soak in a bath or take a shower. Imagine a peaceful place or vacation. Get some support. Talk with family or friends about your stress. Join a support group. Talk with a counselor or therapist. Get some physical activity. Go for a walk, run, or bike ride. Play a favorite sport. Practice yoga.  Medicines Talk with your health care provider about medicines that might help you dealwith cravings and make quitting easier for you. Relationships Social situations can be difficult when you are quitting smoking. To manage this, you can: Avoid parties and other social situations where people might be smoking. Avoid alcohol. Leave right away if you have the urge to smoke. Explain to your family and friends that you are quitting smoking. Ask for support and let them know you might be a bit grumpy. Plan activities where smoking is not an option. General instructions Be aware that many people gain weight after they quit smoking. However, not everyone does. To keep from gaining weight, have a plan in place before you quit and stick to the plan after you quit. Your plan should include: Having healthy snacks. When you have a craving, it may help to: Eat popcorn, carrots, celery, or other cut vegetables. Chew sugar-free gum. Changing how you eat. Eat small  portion sizes at meals. Eat 4-6 small meals throughout the day instead of 1-2 large meals a day. Be mindful when you eat. Do not watch television or do other things that might distract you as you eat. Exercising regularly. Make time to exercise each day. If you do not have time for a long workout, do short bouts of exercise for 5-10 minutes several times a day. Do some form of strengthening exercise, such as weight lifting. Do some exercise that gets your heart beating and causes you to breathe deeply, such as walking fast, running, swimming, or biking. This is very important. Drinking plenty of water or other low-calorie or no-calorie drinks. Drink 6-8 glasses of water daily.  How to recognize withdrawal symptoms Your body and mind may experience discomfort as you try to get used to not having nicotine in your system. These effects are called withdrawal symptoms. They may include: Feeling hungrier than normal. Having trouble concentrating. Feeling irritable or restless. Having trouble sleeping. Feeling depressed. Craving a cigarette. To manage withdrawal symptoms: Avoid places, people, and activities that trigger your cravings. Remember why you want to quit. Get plenty of sleep. Avoid coffee and other caffeinated drinks. These may worsen some of your symptoms. These symptoms may surprise you. But be assured that they are normal to havewhen quitting smoking. How to manage cravings Come up with a plan for how to deal with your cravings. The plan should include the following: A definition of the specific situation you want to deal with. An alternative action you will take. A clear idea for how this action will help. The   name of someone who might help you with this. Cravings usually last for 5-10 minutes. Consider taking the following actions to help you with your plan to deal with cravings: Keep your mouth busy. Chew sugar-free gum. Suck on hard candies or a straw. Brush your  teeth. Keep your hands and body busy. Change to a different activity right away. Squeeze or play with a ball. Do an activity or a hobby, such as making bead jewelry, practicing needlepoint, or working with wood. Mix up your normal routine. Take a short exercise break. Go for a quick walk or run up and down stairs. Focus on doing something kind or helpful for someone else. Call a friend or family member to talk during a craving. Join a support group. Contact a quitline. Where to find support To get help or find a support group: Call the National Cancer Institute's Smoking Quitline: 1-800-QUIT NOW (784-8669) Visit the website of the Substance Abuse and Mental Health Services Administration: www.samhsa.gov Text QUIT to SmokefreeTXT: 478848 Where to find more information Visit these websites to find more information on quitting smoking: National Cancer Institute: www.smokefree.gov American Lung Association: www.lung.org American Cancer Society: www.cancer.org Centers for Disease Control and Prevention: www.cdc.gov American Heart Association: www.heart.org Contact a health care provider if: You want to change your plan for quitting. The medicines you are taking are not helping. Your eating feels out of control or you cannot sleep. Get help right away if: You feel depressed or become very anxious. Summary Quitting smoking is a physical and mental challenge. You will face cravings, withdrawal symptoms, and temptation to smoke again. Preparation can help you as you go through these challenges. Try different techniques to manage stress, handle social situations, and prevent weight gain. You can deal with cravings by keeping your mouth busy (such as by chewing gum), keeping your hands and body busy, calling family or friends, or contacting a quitline for people who want to quit smoking. You can deal with withdrawal symptoms by avoiding places where people smoke, getting plenty of rest, and  avoiding drinks with caffeine. This information is not intended to replace advice given to you by your health care provider. Make sure you discuss any questions you have with your healthcare provider. Document Revised: 07/06/2019 Document Reviewed: 07/06/2019 Elsevier Patient Education  2022 [employer].  

## 2021-04-24 NOTE — Progress Notes (Signed)
Established Patient Office Visit  Subjective:  Patient ID: Ralph Mack, male    DOB: 1966/07/03  Age: 55 y.o. MRN: [id number]  CC:  Chief Complaint  Patient presents with   Follow-up   Hypertension   COPD    HPI [place] L[place]r presents for follow up on hypertension and COPD.  Patient reports currently obtaining CPAP initiated and has been using it for 3 days now.  Continues to adjust to the machine but has noticed sleeping better and is not snoring.  Patient continues to abstain from tobacco use since his hospitalization in April.  Reports no longer using nicotine patches.  States is working on reducing alcohol use. Has c/o intermittent left ear itching and fullness sensation. States has been applying hydrogen peroxide drops. Denies otorrhea, otalgia or fever.  HTN: Pt denies chest pain, palpitations, dizziness or lower extremity swelling. Taking medication as directed without side effects. Checks BP at home and readings range<130/80.   Atrial flutter: Patient had an ablation earlier this month and reports is doing well.  Denies chest pain, palpitations or fatigue.  Past Medical History:  Diagnosis Date   Allergy    Arthritis    Hx of colonic polyp 11/13/2016   10/2016 7 mm ssp/a - recall 2023   Hypertension    Hypothyroidism    Lymphocytic colitis 04/19/2019   Mild CAD 2009   Obesity    OSA (obstructive sleep apnea) 04/20/2021   Paroxysmal atrial flutter (HCC)    Prediabetes    Recurrent upper respiratory infection (URI)     Past Surgical History:  Procedure Laterality Date   A-FLUTTER ABLATION N/A 04/06/2021   Procedure: A-FLUTTER ABLATION;  Surgeon: Vickie Epley, MD;  Location: [facility];  Service: Cardiovascular;  Laterality: N/A;   COLONOSCOPY  2018   CORONARY ANGIOGRAM  2009    Family History  Problem Relation Age of Onset   Asthma Mother    COPD Mother    Cancer Mother        Skin   Cancer - Other Mother        bone, liver, breast, lung    Emphysema Mother    Heart disease Father        Pacemaker   Parkinson's disease Father    Depression Father    Depression Daughter    OCD Daughter    Alcohol abuse Maternal Grandmother    Schizophrenia Maternal Grandmother    Diabetes Paternal Grandfather    Kidney disease Paternal Grandfather    Colon polyps Neg Hx    Colon cancer Neg Hx    Esophageal cancer Neg Hx    Stomach cancer Neg Hx    Rectal cancer Neg Hx     Social History   Socioeconomic History   Marital status: Married    Spouse name: Not on file   Number of children: 2   Years of education: Not on file   Highest education level: Not on file  Occupational History   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     Social Determinants of Health   Financial Resource Strain: Not on file  Food Insecurity: Not on file  Transportation Needs: Not on file  Physical Activity: Not on file  Stress: Not on file  Social Connections: Not on file  Intimate Partner Violence: Not on file    Outpatient Medications Prior to Visit  Medication Sig Dispense Refill   acetaminophen (TYLENOL) 650 MG CR tablet Take 1,300-1,950 mg by mouth 2 (two) times daily as needed for pain.     albuterol (PROAIR HFA) 108 (90 Base) MCG/ACT inhaler Inhale 2 puffs into the lungs every 4 (four) hours as needed for wheezing or shortness of breath. 18 g 3   allopurinol (ZYLOPRIM) 300 MG  tablet Take 1 tablet (300 mg total) by mouth daily. 90 tablet 1   bismuth subsalicylate (PEPTO BISMOL) 262 MG chewable tablet Chew 524 mg by mouth daily as needed for indigestion.     busPIRone (BUSPAR) 15 MG tablet Take 1 tablet (15 mg total) by mouth 2 (two) times daily. 180 tablet 1   Cholecalciferol (VITAMIN D3) 5000 units TABS 5,000 IU OTC vitamin D3 daily. 90 tablet 3   Dextrose-Fructose-Sod Citrate (NAUZENE) 6691601711 MG CHEW Chew 1 tablet by mouth daily as needed (nausea).     diltiazem (CARDIZEM CD) 120 MG 24 hr capsule Take 1 capsule (120 mg total) by mouth daily. 90 capsule 3   diphenhydrAMINE HCl, Sleep, 50 MG CAPS Take 50 mg by mouth at bedtime.     EUTHYROX 50 MCG tablet Take 1 tablet by mouth once daily 90 tablet 0   fexofenadine (ALLEGRA) 180 MG tablet Take 180 mg by mouth daily.     fluticasone (FLONASE) 50 MCG/ACT nasal spray Place 2 sprays into the nose daily.     hydrochlorothiazide (MICROZIDE) 12.5 MG capsule Take 1 capsule by mouth once daily 90 capsule 0   losartan (COZAAR) 50 MG tablet Take 50 mg by mouth daily.     oxymetazoline (AFRIN) 0.05 % nasal spray Place 1 spray into both nostrils 2 (two) times daily as needed for congestion.     Potassium 99 MG TABS Take 297-396 mg by mouth daily.     rivaroxaban (XARELTO) 20 MG TABS tablet Take 1 tablet (20 mg total) by mouth every evening. 30 tablet 11   Sodium Chloride-Sodium Bicarb (AYR SALINE NASAL RINSE NA) Place 1 Dose into the nose daily as needed (congestion).     tadalafil (CIALIS) 10 MG tablet Take 1 tablet (10 mg total) by mouth daily as needed for erectile dysfunction. 10 tablet 1   Carboxymethylcellulose Sodium (ARTIFICIAL TEARS OP) Place 1 drop into both eyes daily as needed (dry eyes).     nicotine (NICODERM CQ - DOSED IN MG/24 HOURS) 21 mg/24hr patch Place 1 patch (21 mg total) onto the skin daily. (Patient not taking: No sig reported) 28 patch 0   pantoprazole (PROTONIX) 40 MG tablet Take 1 tablet (40 mg total)  by mouth 2 (two) times daily before a meal for 10 days, THEN 1 tablet (40 mg total) daily. 50 tablet 0   No facility-administered medications prior to visit.    No Known Allergies  ROS Review of Systems A fourteen system review of systems  was performed and found to be positive as per HPI.   Objective:    Physical Exam General:  Well Developed, well nourished, in no acute distress Neuro:  Alert and oriented,  extra-ocular muscles intact  HEENT:  Normocephalic, atraumatic, normal TM of both ears with some erythema and scaly skin of external canal, neck supple, no cervical adenopathy  Skin:  no gross rash, warm, pink. Cardiac:  RRR, S1 S2 Respiratory:  ECTA B/L w/ slight decreased breath sounds at lung bases, Not using accessory muscles, speaking in full sentences- unlabored. Vascular:  Ext warm, no cyanosis apprec.; cap RF less 2 sec. Psych:  No HI/SI, judgement and insight good, Euthymic mood. Full Affect.  BP 113/78   Pulse 61   Temp 98.7 F (37.1 C)   Ht '5\' 7"'  (1.702 m)   Wt 266 lb 1.6 oz (120.7 kg)   SpO2 95%   BMI 41.68 kg/m  Wt Readings from Last 3 Encounters:  04/24/21 266 lb 1.6 oz (120.7 kg)  04/06/21 260 lb (117.9 kg)  03/23/21 261 lb 6.4 oz (118.6 kg)     Health Maintenance Due  Topic Date Due   COVID-19 Vaccine (1) Never done   Pneumococcal Vaccine 54-72 Years old (1 - PCV) Never done   Zoster Vaccines- Shingrix (1 of 2) Never done    There are no preventive care reminders to display for this patient.  Lab Results  Component Value Date   TSH 2.972 01/06/2021   Lab Results  Component Value Date   WBC 6.9 03/23/2021   HGB 13.7 03/23/2021   HCT 39.7 03/23/2021   MCV 94 03/23/2021   PLT 193 03/23/2021   Lab Results  Component Value Date   NA 138 03/23/2021   K 4.7 03/23/2021   CO2 28 03/23/2021   GLUCOSE 117 (H) 03/23/2021   BUN 16 03/23/2021   CREATININE 0.74 (L) 03/23/2021   BILITOT 0.2 01/16/2021   ALKPHOS 62 01/16/2021   AST 17 01/16/2021    ALT 31 01/16/2021   PROT 6.2 01/16/2021   ALBUMIN 4.2 01/16/2021   CALCIUM 9.3 03/23/2021   ANIONGAP 10 01/10/2021   EGFR 106 03/23/2021   Lab Results  Component Value Date   CHOL 191 01/10/2021   Lab Results  Component Value Date   HDL 66 01/10/2021   Lab Results  Component Value Date   LDLCALC 104 (H) 01/10/2021   Lab Results  Component Value Date   TRIG 105 01/10/2021   Lab Results  Component Value Date   CHOLHDL 2.9 01/10/2021   Lab Results  Component Value Date   HGBA1C 5.6 01/10/2021      Assessment & Plan:   Problem List Items Addressed This Visit       Cardiovascular and Mediastinum   Essential hypertension - Primary    -Controlled. -Continue current medication regimen. -Recommend to increase water hydration. -Will continue to monitor.         Respiratory   OSA (obstructive sleep apnea)    -Followed by pulmonology. -Recommend to continue with CPAP.          Other   Mood disorder ([place])    -Followed by psychiatry. -PHQ-9 score of 0, GAD-7 score of 0. -Continue current medication regimen.       Other Visit Diagnoses     Paroxysmal atrial flutter (HCC)       Eczematoid otitis externa of both ears, unspecified chronicity       Relevant Medications   acetic  acid-hydrocortisone (VOSOL-HC) OTIC solution   Uncomplicated alcohol dependence ([employer])   (Chronic)     Encounter for tobacco use cessation counseling          Paroxysmal atrial flutter: -Followed by cardiology. -S/p ablation. -Recommend to continue anticoagulant.   Uncomplicated alcohol dependence: -Encourage to continue with alcohol reduction, limit to 2 drinks/day and no more than 14 drinks per week.  Eczematoid otitis externa: -Recommend to use otic drops for 7-10 days. -Follow up if symptoms fail to improve or worsen.  Encounter for tobacco use cessation counseling: -Smoking cessation instruction/counseling given:  commended patient for quitting and reviewed  strategies for preventing relapses    Meds ordered this encounter  Medications   acetic acid-hydrocortisone (VOSOL-HC) OTIC solution    Sig: Place 3 drops into both ears 2 (two) times daily.    Dispense:  10 mL    Refill:  0    Order Specific Question:   Supervising Provider    Answer:   Beatrice Lecher D [[id number]]    Follow-up: Return in about 3 months (around 07/25/2021) for HTN, HLD, OSA and FBW.   Note:  This note was prepared with assistance of Dragon voice recognition software. Occasional wrong-word or sound-a-like substitutions may have occurred due to the inherent limitations of voice recognition software.  [name], PA-C

## 2021-04-24 NOTE — Assessment & Plan Note (Signed)
-  Followed by pulmonology. -Recommend to continue with CPAP.

## 2021-04-24 NOTE — Telephone Encounter (Signed)
Dr. Chase Caller please advise on the following My Chart message:   I have been using cpap for 3 days now, i was wondering who will help me get info from machine to a dot doctor for a physical to return to work. I am also getting different info about how much time is needed to obtain the info.  Thank you

## 2021-04-24 NOTE — Assessment & Plan Note (Signed)
>>  ASSESSMENT AND PLAN FOR MOOD DISORDER (HCC) WRITTEN ON 04/24/2021 12:53 PM BY ABONZA, MARITZA, PA-C  -Followed by psychiatry. -PHQ-9 score of 0, GAD-7 score of 0. -Continue current medication regimen.

## 2021-04-24 NOTE — Telephone Encounter (Signed)
Pt's C-Pap compliance is being faxed over to office for Dr. Annamaria Boots to review. Patrice/ Lauren can you please give update of pt's disability status?

## 2021-04-24 NOTE — Assessment & Plan Note (Signed)
-  Followed by psychiatry. -PHQ-9 score of 0, GAD-7 score of 0. -Continue current medication regimen.

## 2021-04-24 NOTE — Assessment & Plan Note (Signed)
-  Controlled. -Continue current medication regimen. -Recommend to increase water hydration. -Will continue to monitor.

## 2021-04-27 NOTE — Telephone Encounter (Signed)
Dr. Annamaria Boots is going to be handling this. There is another encounter that has been sent to Dr Annamaria Boots about the same thing so closing this encounter.  Please refer to encounter from 04/05/21.

## 2021-04-27 NOTE — Telephone Encounter (Signed)
Per phone encounter from 7/20, Ralph Mack has been working with pt to help him receive his cpap machine. Attempted to call Misty at [phone] ext [phone] to see if she could fax [place] a download of pt's current cpap data so we could give it to Dr. Annamaria Boots to review but unable to reach. Left message for her to return call.

## 2021-04-27 NOTE — Telephone Encounter (Signed)
I saw him x 1 in April 2022 and referred him to sleep doc. I See some noets from Dr Annamaria Boots. I do not deal with sleep and cPAP.

## 2021-05-01 ENCOUNTER — Telehealth: Payer: Self-pay | Admitting: Internal Medicine

## 2021-05-01 DIAGNOSIS — G4733 Obstructive sleep apnea (adult) (pediatric): Secondary | ICD-10-CM

## 2021-05-01 NOTE — Telephone Encounter (Signed)
Dr. Annamaria Boots, please advise on this if you ever received info on pt's CPAP.  Pt is also wanting to have his O2 d/c'd.  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

## 2021-05-01 NOTE — Telephone Encounter (Signed)
Pt calling to get Oxygen d/c'd. Also calling to see if we received cpap info. Also needing to be able to schedule DOT physical, but cannot do that until he gets the green light from our dr. Once he gets the green light he can schedule the physical. Please advise [phone]

## 2021-05-02 NOTE — Telephone Encounter (Signed)
I remember seeing his download and sent it to be scanned, but it is not in media yet. Ok to d/c home O2 at his request.

## 2021-05-02 NOTE — Telephone Encounter (Signed)
Left message for patient to call back  

## 2021-05-02 NOTE — Telephone Encounter (Signed)
Hello Dr. Annamaria Boots, please advise on mychart message and on form from [place]. Thanks!  Good morning! I was wondering if you have received the Fax from [facility] at [employer], regarding my data foe the cpap machine? I need to know if everything looks good so I can set up my DOT physical and get back to work by next Wed August 10th, and I will need a return to work form from [employer] filled out and faxed to [place] that I am fit for duty. I can send that form via fax or email.

## 2021-05-03 NOTE — Telephone Encounter (Signed)
Lmtcb for pt.  

## 2021-05-04 ENCOUNTER — Other Ambulatory Visit: Payer: Self-pay | Admitting: Physician Assistant

## 2021-05-04 ENCOUNTER — Other Ambulatory Visit: Payer: Self-pay

## 2021-05-04 ENCOUNTER — Ambulatory Visit (INDEPENDENT_AMBULATORY_CARE_PROVIDER_SITE_OTHER): Payer: BC Managed Care – PPO | Admitting: Cardiology

## 2021-05-04 VITALS — BP 114/72 | HR 66 | Ht 67.5 in | Wt 268.0 lb

## 2021-05-04 DIAGNOSIS — I483 Typical atrial flutter: Secondary | ICD-10-CM

## 2021-05-04 DIAGNOSIS — I2510 Atherosclerotic heart disease of native coronary artery without angina pectoris: Secondary | ICD-10-CM

## 2021-05-04 DIAGNOSIS — I10 Essential (primary) hypertension: Secondary | ICD-10-CM | POA: Diagnosis not present

## 2021-05-04 DIAGNOSIS — M1000 Idiopathic gout, unspecified site: Secondary | ICD-10-CM

## 2021-05-04 NOTE — Telephone Encounter (Signed)
Called and spoke with patient.  He needs a letter stating that he is able to go back to work. He is supposed to be going back next Friday but can't get his DOT physical paper filled out. He is going to upload to [place] so we can print and have Dr. Annamaria Boots fill out so we can fax it today.   Will keep in triage as message was closed until we have form and it has been filled out and faxed.

## 2021-05-04 NOTE — Telephone Encounter (Signed)
Pt returning a phone call. Pt can reached at [phone]. Pt is requesting his information to get his DOT physical for him to get back to work.

## 2021-05-04 NOTE — Progress Notes (Signed)
Electrophysiology Office Follow up Visit Note:    Date:  05/04/2021   ID:  Ralph Mack, DOB 1965-11-28, MRN [id number]  PCP:  [name], PA-C  [facility] Cardiologist:  Minus Breeding, MD  [facility] Electrophysiologist:  Vickie Epley, MD    Interval History:    Ralph Mack is a 55 y.o. male who presents for a follow up visit after his atrial flutter ablation on April 06, 2021.  He has done well after his ablation without a recurrence of arrhythmia.  He continues to take Xarelto for stroke prophylaxis.  He is with his wife today in clinic who I have previously met.     Past Medical History:  Diagnosis Date   Allergy    Arthritis    Hx of colonic polyp 11/13/2016   10/2016 7 mm ssp/a - recall 2023   Hypertension    Hypothyroidism    Lymphocytic colitis 04/19/2019   Mild CAD 2009   Obesity    OSA (obstructive sleep apnea) 04/20/2021   Paroxysmal atrial flutter (HCC)    Prediabetes    Recurrent upper respiratory infection (URI)     Past Surgical History:  Procedure Laterality Date   A-FLUTTER ABLATION N/A 04/06/2021   Procedure: A-FLUTTER ABLATION;  Surgeon: Vickie Epley, MD;  Location: [facility];  Service: Cardiovascular;  Laterality: N/A;   COLONOSCOPY  2018   CORONARY ANGIOGRAM  2009    Current Medications: Current Meds  Medication Sig   acetaminophen (TYLENOL) 650 MG CR tablet Take 1,300-1,950 mg by mouth 2 (two) times daily as needed for pain.   acetic acid-hydrocortisone (VOSOL-HC) OTIC solution Place 3 drops into both ears 2 (two) times daily.   albuterol (PROAIR HFA) 108 (90 Base) MCG/ACT inhaler Inhale 2 puffs into the lungs every 4 (four) hours as needed for wheezing or shortness of breath.   allopurinol (ZYLOPRIM) 300 MG tablet Take 1 tablet by mouth once daily   bismuth subsalicylate (PEPTO BISMOL) 262 MG chewable tablet Chew 524 mg by mouth daily as needed for indigestion.   busPIRone (BUSPAR) 15 MG tablet Take 1 tablet (15 mg  total) by mouth 2 (two) times daily.   Cholecalciferol (VITAMIN D3) 5000 units TABS 5,000 IU OTC vitamin D3 daily.   Dextrose-Fructose-Sod Citrate (NAUZENE) 615 635 5876 MG CHEW Chew 1 tablet by mouth daily as needed (nausea).   diltiazem (CARDIZEM CD) 120 MG 24 hr capsule Take 1 capsule (120 mg total) by mouth daily.   diphenhydrAMINE HCl, Sleep, 50 MG CAPS Take 50 mg by mouth at bedtime.   EUTHYROX 50 MCG tablet Take 1 tablet by mouth once daily   fexofenadine (ALLEGRA) 180 MG tablet Take 180 mg by mouth daily.   fluticasone (FLONASE) 50 MCG/ACT nasal spray Place 2 sprays into the nose daily.   hydrochlorothiazide (MICROZIDE) 12.5 MG capsule Take 1 capsule by mouth once daily   losartan (COZAAR) 50 MG tablet Take 1 tablet by mouth once daily   oxymetazoline (AFRIN) 0.05 % nasal spray Place 1 spray into both nostrils 2 (two) times daily as needed for congestion.   Potassium 99 MG TABS Take 297-396 mg by mouth daily.   rivaroxaban (XARELTO) 20 MG TABS tablet Take 1 tablet (20 mg total) by mouth every evening.   Sodium Chloride-Sodium Bicarb (AYR SALINE NASAL RINSE NA) Place 1 Dose into the nose daily as needed (congestion).   tadalafil (CIALIS) 10 MG tablet Take 1 tablet (10 mg total) by mouth daily as needed for erectile  dysfunction.     Allergies:   Patient has no known allergies.   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     Social Determinants of Health   Financial Resource Strain: Not on file  Food Insecurity: Not on file  Transportation Needs: Not on file  Physical Activity: Not on file  Stress: Not on file  Social Connections: Not on file     Family History: The patient's family history includes Alcohol abuse in his maternal grandmother; Asthma in his mother; COPD in his mother; Cancer in his mother; Cancer - Other in his mother; Depression in his daughter and father; Diabetes in his paternal grandfather; Emphysema in his mother; Heart disease in his father; Kidney disease in his paternal grandfather; OCD in his daughter; Parkinson's disease in his father; Schizophrenia in his maternal grandmother. There is no history of Colon polyps, Colon cancer, Esophageal cancer, Stomach cancer, or Rectal cancer.  ROS:   Please see the history of present illness.    All other systems reviewed and are negative.  EKGs/Labs/Other Studies Reviewed:    The following studies were reviewed today:    EKG:  The ekg ordered today demonstrates sinus rhythm.  Late precordial transition.  Left axis deviation.  Recent Labs: 01/06/2021: B Natriuretic Peptide 72.3; TSH 2.972 01/16/2021: ALT 31; Magnesium 1.9 03/23/2021: BUN 16; Creatinine, Ser 0.74; Hemoglobin 13.7; Platelets 193; Potassium 4.7; Sodium 138  Recent Lipid Panel    Component Value Date/Time   CHOL 191 01/10/2021 0450   CHOL 190 08/07/2020 1100   TRIG 105 01/10/2021 0450   HDL 66 01/10/2021 0450   HDL 59 08/07/2020 1100   CHOLHDL 2.9 01/10/2021 0450   VLDL 21 01/10/2021 0450   LDLCALC 104 (H) 01/10/2021 0450   LDLCALC 94 08/07/2020 1100    Physical Exam:    VS:  BP 114/72   Pulse 66   Ht 5' 7.5" (1.715 m)   Wt 268 lb (121.6 kg)   SpO2  93%   BMI 41.36 kg/m     Wt Readings from Last 3 Encounters:  05/04/21 268 lb (121.6 kg)  04/24/21 266 lb 1.6 oz (120.7 kg)  04/06/21 260 lb (117.9 kg)     GEN:  Well nourished, well developed in no acute distress HEENT: Normal NECK: No JVD; No carotid bruits LYMPHATICS: No lymphadenopathy CARDIAC: RRR, no murmurs, rubs, gallops RESPIRATORY:  Clear to auscultation without rales, wheezing or rhonchi  ABDOMEN: Soft, non-tender, non-distended MUSCULOSKELETAL:  No edema; No deformity  SKIN: Warm  and dry NEUROLOGIC:  Alert and oriented x 3 PSYCHIATRIC:  Normal affect   ASSESSMENT:    1. Typical atrial flutter ([place])   2. Essential hypertension   3. Coronary artery disease involving native coronary artery of native heart without angina pectoris    PLAN:    In order of problems listed above:   1. Typical atrial flutter [facility]) Patient is doing well after his flutter ablation.  He continues to take Xarelto for stroke prophylaxis.  We discussed using a loop recorder for long-term surveillance for atrial fibrillation in an effort to avoid long-term exposure anticoagulation.  He is interested in having the loop recorder implanted during today's visit.  I discussed loop recorder implant procedure and risks and he wishes to proceed.  I recommend that he continue to take Xarelto after today's loop recorder implant.  If we do not detect atrial fibrillation after 2 months of monitoring using a loop recorder (3 months total after flutter ablation), I think he should be able to stop the Xarelto.  We discussed this timeline during today's visit.  2. Essential hypertension Controlled during today's visit.  Continue current regimen.  3. Coronary artery disease involving native coronary artery of native heart without angina pectoris          Total time spent with patient today 45 minutes. This includes reviewing records, evaluating the patient and coordinating care.   Medication  Adjustments/Labs and Tests Ordered: Current medicines are reviewed at length with the patient today.  Concerns regarding medicines are outlined above.  Orders Placed This Encounter  Procedures   EKG 12-Lead   No orders of the defined types were placed in this encounter.    Signed, Lars Mage, MD, [facility], [facility] 05/04/2021 5:18 PM    Electrophysiology [facility]  ----------------------------------------------------------------------------------------  SURGEON:  Lars Mage, MD    PREPROCEDURE DIAGNOSIS:  Atrial flutter    POSTPROCEDURE DIAGNOSIS:  Atrial flutter     PROCEDURES:   1. Implantable loop recorder implantation    INTRODUCTION:  Javohn Basey is a 55 y.o. male with a history of atrial flutter post atrial flutter ablation presents today for loop recorder implant as part of surveillance strategy for atrial fibrillation.      DESCRIPTION OF PROCEDURE:  Informed written consent was obtained.  The patient required no sedation for the procedure today.  Mapping over the patient's chest was performed to identify the area where electrograms were most prominent for ILR recording.  This area was found to be the left parasternal region over the 3rd-4th intercostal space. The patients left chest was therefore prepped and draped in the usual sterile fashion. The skin overlying the left parasternal region was infiltrated with lidocaine for local analgesia.  A 0.5-cm incision was made over the left parasternal region over the 3rd intercostal space.  A subcutaneous ILR pocket was fashioned using a combination of sharp and blunt dissection.  A Medtronic Reveal Linq model [id number] [phone]) implantable loop recorder was then placed into the pocket  R waves were very prominent and measured >0.36m.  Steri- Strips and a sterile dressing were then applied.  There were no early apparent complications.     CONCLUSIONS:   1. Successful implantation of a Medtronic Reveal  LINQ implantable loop recorder for palpitations and recurrent symptoms of atrial fibrillation  2. No early apparent complications.   CLars Mage MD 05/04/2021 5:20 PM

## 2021-05-04 NOTE — Addendum Note (Signed)
Addended by: Amado Coe on: 05/04/2021 09:54 AM   Modules accepted: Orders

## 2021-05-04 NOTE — Patient Instructions (Addendum)
Medication Instructions:  Your physician recommends that you continue on your current medications as directed. Please refer to the Current Medication list given to you today.  Labwork: None ordered.  Testing/Procedures: None ordered.  Follow-Up:  Your physician wants you to follow-up in: 9 months with Dr. Quentin Ore.  You will receive a reminder letter in the mail two months in advance. If you don't receive a letter, please call our office to schedule the follow-up appointment.    Implantable Loop Recorder Placement, Care After This sheet gives you information about how to care for yourself after your procedure. Your health care provider may also give you more specific instructions. If you have problems or questions, contact your health care provider. What can I expect after the procedure? After the procedure, it is common to have: Soreness or discomfort near the incision. Some swelling or bruising near the incision.  Follow these instructions at home: Incision care   Leave your outer dressing on for 72 hours.  After 72 hours you can remove your outer dressing and shower. Leave adhesive strips in place. These skin closures may need to stay in place for 1-2 weeks. If adhesive strip edges start to loosen and curl up, you may trim the loose edges.  You may remove the strips if they have not fallen off after 2 weeks. Check your incision area every day for signs of infection. Check for: Redness, swelling, or pain. Fluid or blood. Warmth. Pus or a bad smell. Do not take baths, swim, or use a hot tub until your incision is completely healed. If your wound site starts to bleed apply pressure.      If you have any questions/concerns please call the device clinic at [phone].  Activity  Return to your normal activities.  General instructions Follow instructions from your health care provider about how to manage your implantable loop recorder and transmit the information. Learn how to  activate a recording if this is necessary for your type of device. Do not go through a metal detection gate, and do not let someone hold a metal detector over your chest. Show your ID card. Do not have an MRI unless you check with your health care provider first. Take over-the-counter and prescription medicines only as told by your health care provider. Keep all follow-up visits as told by your health care provider. This is important. Contact a health care provider if: You have redness, swelling, or pain around your incision. You have a fever. You have pain that is not relieved by your pain medicine. You have triggered your device because of fainting (syncope) or because of a heartbeat that feels like it is racing, slow, fluttering, or skipping (palpitations). Get help right away if you have: Chest pain. Difficulty breathing. Summary After the procedure, it is common to have soreness or discomfort near the incision. Change your dressing as told by your health care provider. Follow instructions from your health care provider about how to manage your implantable loop recorder and transmit the information. Keep all follow-up visits as told by your health care provider. This is important. This information is not intended to replace advice given to you by your health care provider. Make sure you discuss any questions you have with your health care provider. Document Released: 08/28/2015 Document Revised: 11/01/2017 Document Reviewed: 11/01/2017 Elsevier Patient Education  2020 [employer].

## 2021-05-07 NOTE — Telephone Encounter (Signed)
Form given to Dr Annamaria Boots to have this completed. Please let triage know when this has been done. Thanks!

## 2021-05-08 NOTE — Telephone Encounter (Signed)
Completed form faxed 05/07/21 by Magda Paganini, CMA.  Patient requested to have completed form for his job.  Form faxed to Patient's work [phone], attention Samantha.  Copy made for [facility] scan.  Form placed in sealed envelope and Patient stated he would come to office and pick up.  Nothing further at this time.

## 2021-06-12 ENCOUNTER — Other Ambulatory Visit: Payer: Self-pay | Admitting: Cardiology

## 2021-06-18 ENCOUNTER — Ambulatory Visit (INDEPENDENT_AMBULATORY_CARE_PROVIDER_SITE_OTHER): Payer: BC Managed Care – PPO

## 2021-06-18 DIAGNOSIS — I483 Typical atrial flutter: Secondary | ICD-10-CM

## 2021-06-18 LAB — CUP PACEART REMOTE DEVICE CHECK
Date Time Interrogation Session: 20220917091214
Implantable Pulse Generator Implant Date: 20220809

## 2021-06-21 NOTE — Progress Notes (Signed)
HPI  38 yoM Smoker (18.5 pk yrs) followed for OSA, Complicated by Chronic Respiratory Failure with Hypoxia ( Dr Chase Caller) Chronic Bronchitis, AFlutter, Orthostasis, CAD, HTN, Recurrent Sinusitis, Allergic Rhinitis,  Lymphocytic Colitis, Hypothyroid, Cervical Disc Disease, Obesity, Hypertriglyceridemia,  Meds include Ventolin HFA, Nicoderm CQ,  O2 -dc'd per patient request- not using HST- 03/28/21- AHI 60/ hr, desaturation 49-82%, body weight 252 lbs Walk Test 02/20/21- desat to 85% during second lap with HR 90. Improved to 95% on 2L. Patient denied shortness of breath during walk. The  CPAP machine is called [employer] and it is provided free of charge by his employer since his deductible has already been met for this year.  He gave me a contact # for Cory Munch that he has been working with to get the machine.  She can be reached at [phone] ext:  [phone].  The fax # is [phone] and the e-mail address is [email or web address]. ==================================================================================   02/20/21- 75 yoM Smoker for sleep evaluation courtesy of Dr Chase Caller Medical problem list includes AFlutter, Orthostasis, CAD, HTN, Chronic Bronchitis, Recurrent Sinusitis, Allergic Rhinitis,  Lymphocytic Colitis, Hypothyroid, Cervical Disc Disease, Obesity, Hypertriglyceridemia,  Meds include Ventolin HFA, Nicoderm CQ,  Hosp April for Acute Hypoxic Resp Failure, COPD exacerbation, Pneumonia RUL nodule, , Was discharged on O2 for sleep. Scheduled for PFT and f/u with Dr Chase Caller in June.  Epworth score- Body weight today- Covid vax-none Walk Test 02/20/21- desat to 85% during second lap with HR 90. Improved to 95% on 2L. Patient denied shortness of breath during walk. He notes daytime tiredness and has needed to pull over while driving. Hx snoring.  Nocturia x 4-5. Continues using O2 2l during sleep since [place] in April. Does have portable- not using. 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  Uses otc sleep med. 20 oz AM coffee.  Denies ENT surgery, but bothersome postnasal drip.Hx asthma, not feeling need now for inhalers.  Father died Parkinsons. Pt denies complex parasomnias.   Addendum 04/20/21- Called and spoke with patient, he states that he already has a machine on it's way to him through the company he works for [employer]).  He missed the delivery for yesterday, but should receive it sometime today.  The machine is called Sleepsafe and it is provided free of charge by his employer since his deductible has already been met for this year.  He gave me a contact # for Cory Munch that he has been working with to get the machine.  She can be reached at [phone] ext:  [phone].  The fax # is [phone] and the e-mail address is [email or web address].  I let him know I would give her a call to see how we would go about getting the download information from the machine.   Called [place] and spoke with [facility], she advised that we can call and request a download and they can fax it to [place].  I provided our fax # and that the fax should be to the attention of Dr. Annamaria Boots.  They have everything they need as far as setting, sleep study and history.  06/25/21- 13 yoM Smoker (18.5 pk yrs) followed for OSA, Complicated by Chronic Respiratory Failure with Hypoxia ( Dr Chase Caller) Chronic Bronchitis, AFlutter, Orthostasis, CAD, HTN, Recurrent Sinusitis, Allergic Rhinitis,  Lymphocytic Colitis, Hypothyroid, Cervical Disc Disease, Obesity, Hypertriglyceridemia,  Meds include Ventolin HFA, Nicoderm CQ,  O2 -dc'd per patient request- not using HST- 03/28/21- AHI 60/ hr, desaturation 49-82%, body weight  252 lbs CPAP  auto 5-20    / [employer] Driver DME ([phone]) Download- SD card is blank we are calling his [employer] DME company for download information. Body weight today- 275 lbs He says he is claustrophobic but tolerates nasal  mask. Declines flu shot.  ROS-see HPI   + = positive Constitutional:    weight loss, night sweats, fevers, chills, fatigue, lassitude. HEENT:    +Headaches,+ difficulty swallowing, tooth/dental problems,+ sore throat,,+sneezing, +itching, +ear ache, +nasal congestion, post nasal drip, snoring CV:    chest pain, orthopnea, PND, swelling in lower extremities, anasarca,                                   dizziness, palpitations Resp:   +shortness of breath with exertion or at rest.                productive cough,   +non-productive cough, coughing up of blood.              change in color of mucus.  wheezing.   Skin:    rash or lesions. GI:  + heartburn, indigestion, abdominal pain, nausea, vomiting, diarrhea,                 change in bowel habits, loss of appetite GU: dysuria, change in color of urine, no urgency or frequency.   flank pain. MS:   joint pain, stiffness, decreased range of motion, back pain. Neuro-     nothing unusual Psych:  change in mood or affect.  depression or +anxiety.   memory loss.  OBJ- Physical Exam General- Alert, Oriented, Affect-appropriate, Distress- none acute, + obese Skin- rash-none, lesions- none, excoriation- none Lymphadenopathy- none Head- atraumatic            Eyes- Gross vision intact, PERRLA, conjunctivae and secretions clear            Ears- Hearing, canals-normal            Nose- Clear, no-Septal dev, mucus, polyps, erosion, perforation             Throat- Mallampati II-IVI , mucosa clear , drainage- none, tonsils- atrophic, + teeth Neck- flexible , trachea midline, no stridor , thyroid nl, carotid no bruit Chest - symmetrical excursion , unlabored           Heart/CV- RRR , no murmur , no gallop  , no rub, nl s1 s2                           - JVD- none , edema- none, stasis changes- none, varices- none           Lung- clear to P&A, wheeze- none, cough- none , dullness-none, rub- none           Chest wall-  Abd-  Br/ Gen/ Rectal- Not done, not  indicated Extrem- cyanosis- none, clubbing, none, atrophy- none, strength- nl Neuro- grossly intact to observation

## 2021-06-25 ENCOUNTER — Other Ambulatory Visit: Payer: Self-pay

## 2021-06-25 ENCOUNTER — Telehealth: Payer: Self-pay

## 2021-06-25 ENCOUNTER — Ambulatory Visit (INDEPENDENT_AMBULATORY_CARE_PROVIDER_SITE_OTHER): Payer: BC Managed Care – PPO | Admitting: Internal Medicine

## 2021-06-25 ENCOUNTER — Encounter: Payer: Self-pay | Admitting: Internal Medicine

## 2021-06-25 DIAGNOSIS — G4733 Obstructive sleep apnea (adult) (pediatric): Secondary | ICD-10-CM

## 2021-06-25 DIAGNOSIS — J9611 Chronic respiratory failure with hypoxia: Secondary | ICD-10-CM

## 2021-06-25 NOTE — Progress Notes (Signed)
Carelink Summary Report / Loop Recorder 

## 2021-06-25 NOTE — Telephone Encounter (Signed)
Call made to DME regarding cpap data card being blank. They are going to fax a copy of the compliance report.   Call made to patient, made aware to be sure SD card is pushed all the way in because they machine is downloading and they are able to see his reports. Voiced understanding.   Nothing further needed at this time.

## 2021-06-25 NOTE — Patient Instructions (Addendum)
We can continue CPAP auto 5-20  Please call us if we can help  Make sure you have a follow-up appointment with Dr Chase Caller for your long-term pulmonary help.

## 2021-06-29 ENCOUNTER — Encounter: Payer: Self-pay | Admitting: Pharmacist

## 2021-06-29 ENCOUNTER — Telehealth: Payer: Self-pay | Admitting: Cardiology

## 2021-06-29 NOTE — Telephone Encounter (Signed)
Spoke to pt. He report while working last week he was bending down pulling something heavy then woke up the next morning with his eye bright red. He state it was getting better on his day off but once returned to work, the next day it was red again.  Pt also report since starting xarelto, he's noticed his stool is dark every time.   Nurse consulted with Pharm D who recommended pt should be evaluated further by ER. Pt made aware and state he refuse to go. Nurse emphasized again the importance of being assessed but pt state he may go tomorrow.

## 2021-06-29 NOTE — Telephone Encounter (Signed)
Copy of Pt's MyChart Message Please Advise:   I woke up to this 1 week ago and it started clear up, it was also very painful. I then had it happen again on Wed morning when woke up. 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 I also had used some Ibuprofens 3 different times during the week, no more then 600 mgs at a time, for pain. Tylenol won't help. I guess my question is? Is this the blood thinner or ibuprofens causing this? And what should I do going forward?

## 2021-06-29 NOTE — Telephone Encounter (Signed)
This encounter was created in error - please disregard.

## 2021-06-30 ENCOUNTER — Ambulatory Visit
Admission: EM | Admit: 2021-06-30 | Discharge: 2021-06-30 | Disposition: A | Discharge status: Home / Self Care | Payer: BC Managed Care – PPO | Attending: Urgent Care | Admitting: Urgent Care

## 2021-06-30 ENCOUNTER — Other Ambulatory Visit: Payer: Self-pay

## 2021-06-30 DIAGNOSIS — I4891 Unspecified atrial fibrillation: Secondary | ICD-10-CM

## 2021-06-30 DIAGNOSIS — H1131 Conjunctival hemorrhage, right eye: Secondary | ICD-10-CM | POA: Diagnosis not present

## 2021-06-30 DIAGNOSIS — H5789 Other specified disorders of eye and adnexa: Secondary | ICD-10-CM

## 2021-06-30 MED ORDER — TOBRAMYCIN 0.3 % OP SOLN: 1.0000 [drp] | OPHTHALMIC | 0 refills | Status: DC

## 2021-06-30 NOTE — Discharge Instructions (Addendum)
I suspect that you have subconjunctival hemorrhage of your right eye. Please follow up with an eye doctor to have specific testing for ruling out glaucoma. If your symptoms get dramatically worse then please report to the ER for a more urgent evaluation of your eye. In the event that you have an eye infection, we can use tobramycin once every 4 hours to address this. I would avoid the use of over the counter clear eyes, artificial tears.

## 2021-06-30 NOTE — ED Triage Notes (Signed)
Over 1 week h/o intermittent right eye redness that resolves spontaneously. Denies any visual decrease. Has been using artifical tears without a change in sxs.

## 2021-06-30 NOTE — ED Provider Notes (Signed)
[place]   MRN: [id number] DOB: 10-23-1965  Subjective:   Ralph Mack is a 55 y.o. male presenting for 2 separate episodes of a red eye over the span of 4 days.  Patient states that the first time it has been cleared up on its own.  He did have some pain over the lower part of his eye and also had some watering.  He does have a history of atrial fibrillation and is taking Xarelto.  States that he does have to perform strenuous work activities, has to strain and noticed that his red eye did happen after this.  His vision is generally unaffected.  He has gone to an eye doctor recently and did not have anything specific, except for the possibility of developing cataracts.  No current facility-administered medications for this encounter.  Current Outpatient Medications:  .  acetaminophen (TYLENOL) 650 MG CR tablet, Take 1,300-1,950 mg by mouth 2 (two) times daily as needed for pain., Disp: , Rfl:  .  acetic acid-hydrocortisone (VOSOL-HC) OTIC solution, Place 3 drops into both ears 2 (two) times daily., Disp: 10 mL, Rfl: 0 .  albuterol (PROAIR HFA) 108 (90 Base) MCG/ACT inhaler, Inhale 2 puffs into the lungs every 4 (four) hours as needed for wheezing or shortness of breath., Disp: 18 g, Rfl: 3 .  allopurinol (ZYLOPRIM) 300 MG tablet, Take 1 tablet by mouth once daily, Disp: 90 tablet, Rfl: 0 .  bismuth subsalicylate (PEPTO BISMOL) 262 MG chewable tablet, Chew 524 mg by mouth daily as needed for indigestion., Disp: , Rfl:  .  busPIRone (BUSPAR) 15 MG tablet, Take 1 tablet (15 mg total) by mouth 2 (two) times daily., Disp: 180 tablet, Rfl: 1 .  Cholecalciferol (VITAMIN D3) 5000 units TABS, 5,000 IU OTC vitamin D3 daily., Disp: 90 tablet, Rfl: 3 .  Dextrose-Fructose-Sod Citrate (NAUZENE) [phone] MG CHEW, Chew 1 tablet by mouth daily as needed (nausea)., Disp: , Rfl:  .  diltiazem (CARDIZEM CD) 120 MG 24 hr capsule, Take 1 capsule (120 mg total) by mouth daily., Disp: 90 capsule,  Rfl: 3 .  diphenhydrAMINE HCl, Sleep, 50 MG CAPS, Take 50 mg by mouth at bedtime., Disp: , Rfl:  .  EUTHYROX 50 MCG tablet, Take 1 tablet by mouth once daily, Disp: 90 tablet, Rfl: 0 .  fexofenadine (ALLEGRA) 180 MG tablet, Take 180 mg by mouth daily., Disp: , Rfl:  .  fluticasone (FLONASE) 50 MCG/ACT nasal spray, Place 2 sprays into the nose daily., Disp: , Rfl:  .  hydrochlorothiazide (MICROZIDE) 12.5 MG capsule, Take 1 capsule by mouth once daily, Disp: 90 capsule, Rfl: 0 .  losartan (COZAAR) 50 MG tablet, Take 1 tablet by mouth once daily, Disp: 90 tablet, Rfl: 0 .  oxymetazoline (AFRIN) 0.05 % nasal spray, Place 1 spray into both nostrils 2 (two) times daily as needed for congestion., Disp: , Rfl:  .  Potassium 99 MG TABS, Take 297-396 mg by mouth daily., Disp: , Rfl:  .  rivaroxaban (XARELTO) 20 MG TABS tablet, Take 1 tablet (20 mg total) by mouth every evening., Disp: 30 tablet, Rfl: 11 .  Sodium Chloride-Sodium Bicarb (AYR SALINE NASAL RINSE NA), Place 1 Dose into the nose daily as needed (congestion)., Disp: , Rfl:  .  tadalafil (CIALIS) 10 MG tablet, TAKE 1 TABLET BY MOUTH ONCE DAILY AS NEEDED FOR ERECTILE DYSFUNCTION, Disp: 10 tablet, Rfl: 0   No Known Allergies  Past Medical History:  Diagnosis Date  . Allergy   .  Arthritis   . Hx of colonic polyp 11/13/2016   10/2016 7 mm ssp/a - recall 2023  . Hypertension   . Hypothyroidism   . Lymphocytic colitis 04/19/2019  . Mild CAD 2009  . Obesity   . OSA (obstructive sleep apnea) 04/20/2021  . Paroxysmal atrial flutter ([place])   . Prediabetes   . Recurrent upper respiratory infection (URI)      Past Surgical History:  Procedure Laterality Date  . A-FLUTTER ABLATION N/A 04/06/2021   Procedure: A-FLUTTER ABLATION;  Surgeon: Vickie Epley, MD;  Location: [facility];  Service: Cardiovascular;  Laterality: N/A;  . COLONOSCOPY  2018  . CORONARY ANGIOGRAM  2009    Family History  Problem Relation Age of Onset  . Asthma  Mother   . COPD Mother   . Cancer Mother        Skin  . Cancer - Other Mother        bone, liver, breast, lung  . Emphysema Mother   . Heart disease Father        Pacemaker  . Parkinson's disease Father   . Depression Father   . Depression Daughter   . OCD Daughter   . Alcohol abuse Maternal Grandmother   . Schizophrenia Maternal Grandmother   . Diabetes Paternal Grandfather   . Kidney disease Paternal Grandfather   . Colon polyps Neg Hx   . Colon cancer Neg Hx   . Esophageal cancer Neg Hx   . Stomach cancer Neg Hx   . Rectal cancer Neg Hx     Social History   Tobacco Use  . Smoking status: Former    Packs/day: 0.50    Years: 37.00    Pack years: 18.50    Types: Cigarettes    Quit date: 01/10/2021    Years since quitting: 0.4  . Smokeless tobacco: Never  . Tobacco comments:    not smoking currently-[place] 02/20/21  Vaping Use  . Vaping Use: Former  Substance Use Topics  . Alcohol use: Yes    Alcohol/week: 12.0 standard drinks    Types: 12 Cans of beer per week  . Drug use: No    ROS   Objective:   Vitals: BP (!) 142/90 (BP Location: Left Arm)   Pulse 73   Temp 98.2 F (36.8 C) (Oral)   Resp 18   SpO2 93% Comment: pt reports that he is usually around 93  Visual Acuity Right Eye Distance: 20/15 Left Eye Distance: 20/10 Bilateral Distance: 20/13  Right Eye Near:   Left Eye Near:    Bilateral Near:     Physical Exam Constitutional:      General: He is not in acute distress.    Appearance: Normal appearance. He is well-developed and normal weight. He is not ill-appearing, toxic-appearing or diaphoretic.  HENT:     Head: Normocephalic and atraumatic.     Right Ear: External ear normal.     Left Ear: External ear normal.     Nose: Nose normal.     Mouth/Throat:     Pharynx: Oropharynx is clear.  Eyes:     General: Lids are everted, no foreign bodies appreciated. Vision grossly intact. No scleral icterus.       Right eye: No foreign body, discharge  or hordeolum.        Left eye: No foreign body, discharge or hordeolum.     Extraocular Movements: Extraocular movements intact.     Right eye: Normal extraocular motion and  no nystagmus.     Left eye: Normal extraocular motion and no nystagmus.     Conjunctiva/sclera:     Right eye: Right conjunctiva is not injected. Hemorrhage present. No chemosis or exudate.    Left eye: Left conjunctiva is not injected. No chemosis, exudate or hemorrhage.    Pupils: Pupils are equal, round, and reactive to light.  Cardiovascular:     Rate and Rhythm: Normal rate.  Pulmonary:     Effort: Pulmonary effort is normal.  Musculoskeletal:     Cervical back: Normal range of motion.  Neurological:     Mental Status: He is alert and oriented to person, place, and time.  Psychiatric:        Mood and Affect: Mood normal.        Behavior: Behavior normal.        Thought Content: Thought content normal.        Judgment: Judgment normal.    Assessment and Plan :   PDMP not reviewed this encounter.  1. Subconjunctival hemorrhage of right eye   2. Redness of right eye   3. Atrial fibrillation, unspecified type ([place])     Recommended he stop using over-the-counter Clear Eyes or artificial eyes.  Suspect subconjunctival hemorrhage mainly possible by his straining, being on Xarelto.  Counseled on possibility of blood, but unfortunately I do not have the ability to diagnose this in my clinic.  Recommended an urgent follow-up with them ophthalmologist.  Low suspicion for an acute process.  Provided him with tobramycin to address infection as he has concerns about this as well given the intermittent pain that he has. Counseled patient on potential for adverse effects with medications prescribed/recommended today, ER and return-to-clinic precautions discussed, patient verbalized understanding.    Jaynee Eagles, PA-C 06/30/21 1051

## 2021-07-04 ENCOUNTER — Other Ambulatory Visit: Payer: Self-pay | Admitting: Physician Assistant

## 2021-07-04 ENCOUNTER — Other Ambulatory Visit: Payer: Self-pay | Admitting: Nurse Practitioner

## 2021-07-04 DIAGNOSIS — I10 Essential (primary) hypertension: Secondary | ICD-10-CM

## 2021-07-04 DIAGNOSIS — J0140 Acute pansinusitis, unspecified: Secondary | ICD-10-CM

## 2021-07-07 ENCOUNTER — Other Ambulatory Visit: Payer: Self-pay | Admitting: Cardiology

## 2021-07-09 ENCOUNTER — Telehealth: Payer: Self-pay | Admitting: Internal Medicine

## 2021-07-09 MED ORDER — DICYCLOMINE HCL 20 MG PO TABS: ORAL_TABLET | ORAL | 0 refills | Status: DC

## 2021-07-09 NOTE — Telephone Encounter (Signed)
Inbound call from pt stating that he only has 6 pills left for Dicyclomine 20mg . Pt has an appt scheduled for 09/05/21. Please advise. Thank you.

## 2021-07-09 NOTE — Telephone Encounter (Signed)
OK to refill dicyclomine as you suggested

## 2021-07-09 NOTE — Telephone Encounter (Signed)
I left him a detailed voicemail that his rx has been refilled as requested to the [facility] on [address].

## 2021-07-09 NOTE — Telephone Encounter (Signed)
Patient requesting dicyclomine 20mg  tablets, one QID before meals and at bedtime prn. Patient last seen 05/2019. He has made an appointment for December. May I send the refill in to cover him Sir?

## 2021-07-16 MED ORDER — ASPIRIN EC 81 MG PO TBEC: 81.0000 mg | DELAYED_RELEASE_TABLET | Freq: Every day | ORAL | 3 refills | Status: DC

## 2021-07-17 ENCOUNTER — Ambulatory Visit: Payer: Self-pay | Admitting: Urology

## 2021-07-19 ENCOUNTER — Telehealth: Payer: Self-pay | Admitting: Physician Assistant

## 2021-07-19 NOTE — Telephone Encounter (Signed)
Ralph Mack is calling requesting to speak with Sonia Baller in regards to an explanation for a bill he received. He states he has already spoke with the billing department and they advised him to speak with the nurse about it.

## 2021-07-23 ENCOUNTER — Ambulatory Visit (INDEPENDENT_AMBULATORY_CARE_PROVIDER_SITE_OTHER): Payer: BC Managed Care – PPO

## 2021-07-23 DIAGNOSIS — I483 Typical atrial flutter: Secondary | ICD-10-CM

## 2021-07-23 LAB — CUP PACEART REMOTE DEVICE CHECK
Date Time Interrogation Session: 20221021150742
Implantable Pulse Generator Implant Date: 20220809

## 2021-07-24 NOTE — Telephone Encounter (Signed)
Per billing dept-have made 2 outreaches to Pt to discuss issue with no answer.  Have left message to call back.  Await further needs.

## 2021-07-31 ENCOUNTER — Other Ambulatory Visit: Payer: Self-pay | Admitting: Physician Assistant

## 2021-07-31 NOTE — Progress Notes (Signed)
Carelink Summary Report / Loop Recorder 

## 2021-08-06 ENCOUNTER — Telehealth: Payer: Self-pay | Admitting: Internal Medicine

## 2021-08-06 NOTE — Telephone Encounter (Signed)
I can't tell what is causing the breathing problem- could be heart or lung, but doesn't sound like infection. Between this and the sleep issues, could we get him in with an app or using one of my held spots ?

## 2021-08-06 NOTE — Telephone Encounter (Signed)
Spoke to patient.  Patient stated that he has been wearing cpap nightly avg 4-5hr. He has not had any improvement in sleep. He is still waking up every hour. He has also had a non prod cough for 2 weeks.  He did some leaf blowing over the weekend and cough worsened. Sob has worsened with exertion over the past 2 weeks. Denied f/c/s  or additional sx. He is using albuterol HFA 3-4x  daily with no relief.  Not vaccinated against covid or flu.  No recent covid test  Dr. Annamaria Boots, please advise.   Current Outpatient Medications on File Prior to Visit  Medication Sig Dispense Refill   acetaminophen (TYLENOL) 650 MG CR tablet Take 1,300-1,950 mg by mouth 2 (two) times daily as needed for pain.     acetic acid-hydrocortisone (VOSOL-HC) OTIC solution Place 3 drops into both ears 2 (two) times daily. 10 mL 0   albuterol (PROAIR HFA) 108 (90 Base) MCG/ACT inhaler Inhale 2 puffs into the lungs every 4 (four) hours as needed for wheezing or shortness of breath. 18 g 3   allopurinol (ZYLOPRIM) 300 MG tablet Take 1 tablet by mouth once daily 90 tablet 0   aspirin EC 81 MG tablet Take 1 tablet (81 mg total) by mouth daily. Swallow whole. 90 tablet 3   bismuth subsalicylate (PEPTO BISMOL) 262 MG chewable tablet Chew 524 mg by mouth daily as needed for indigestion.     busPIRone (BUSPAR) 15 MG tablet Take 1 tablet (15 mg total) by mouth 2 (two) times daily. 180 tablet 1   Cholecalciferol (VITAMIN D3) 5000 units TABS 5,000 IU OTC vitamin D3 daily. 90 tablet 3   Dextrose-Fructose-Sod Citrate (NAUZENE) [phone] MG CHEW Chew 1 tablet by mouth daily as needed (nausea).     dicyclomine (BENTYL) 20 MG tablet Take I tablet by mouth 4 times daily before meals and at bedtime as needed 360 tablet 0   diltiazem (CARDIZEM CD) 120 MG 24 hr capsule Take 1 capsule (120 mg total) by mouth daily. 90 capsule 3   diphenhydrAMINE HCl, Sleep, 50 MG CAPS Take 50 mg by mouth at bedtime.     EUTHYROX 50 MCG tablet Take 1 tablet by  mouth once daily 90 tablet 0   fexofenadine (ALLEGRA) 180 MG tablet Take 180 mg by mouth daily.     fluticasone (FLONASE) 50 MCG/ACT nasal spray Place 2 sprays into the nose daily.     hydrochlorothiazide (MICROZIDE) 12.5 MG capsule Take 1 capsule (12.5 mg total) by mouth daily. **PLEASE CONTACT OUR OFFICE TO SCHEDULE A FOLLOW UP FOR FUTURE MED REFILLS** 90 capsule 0   losartan (COZAAR) 50 MG tablet Take 1 tablet (50 mg total) by mouth daily. **PLEASE CONTACT OUR OFFICE TO SCHEDULE A FOLLOW UP FOR FUTURE MED REFILLS** 90 tablet 0   oxymetazoline (AFRIN) 0.05 % nasal spray Place 1 spray into both nostrils 2 (two) times daily as needed for congestion.     Potassium 99 MG TABS Take 297-396 mg by mouth daily.     Sodium Chloride-Sodium Bicarb (AYR SALINE NASAL RINSE NA) Place 1 Dose into the nose daily as needed (congestion).     tadalafil (CIALIS) 10 MG tablet TAKE 1 TABLET BY MOUTH ONCE DAILY AS NEEDED FOR ERECTILE DYSFUNCTION 10 tablet 0   tobramycin (TOBREX) 0.3 % ophthalmic solution Place 1 drop into the right eye every 4 (four) hours. 5 mL 0   [DISCONTINUED] apixaban (ELIQUIS) 5 MG TABS tablet Take 1 tablet (5 mg total)  by mouth 2 (two) times daily. (Patient not taking: No sig reported) 60 tablet 2   No current facility-administered medications on file prior to visit.   No Known Allergies

## 2021-08-06 NOTE — Telephone Encounter (Signed)
Spoke to patient and relayed below message.  Offered OV for today. 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 Appt scheduled for 08/09/2021 at 4:30. Advised patient to go to ED or UC if sx worsened in the meantime.  Nothing further needed.  Routing to CY as an [place].

## 2021-08-08 ENCOUNTER — Telehealth: Payer: Self-pay | Admitting: *Deleted

## 2021-08-08 NOTE — Telephone Encounter (Signed)
Called DME, Ralph Mack, spoke with [facility] to request a download for his appointment on 11/10.  Provided fax # and she stated she would run the report and fax to our office.  Nothing further needed.

## 2021-08-09 ENCOUNTER — Ambulatory Visit: Payer: BC Managed Care – PPO | Admitting: Adult Health

## 2021-08-10 ENCOUNTER — Other Ambulatory Visit: Payer: Self-pay | Admitting: Physician Assistant

## 2021-08-10 DIAGNOSIS — M1000 Idiopathic gout, unspecified site: Secondary | ICD-10-CM

## 2021-08-11 ENCOUNTER — Emergency Department (HOSPITAL_COMMUNITY): Payer: BC Managed Care – PPO

## 2021-08-11 ENCOUNTER — Inpatient Hospital Stay (HOSPITAL_COMMUNITY)
Admission: EM | Admit: 2021-08-11 | Discharge: 2021-08-13 | DRG: 190 | Disposition: A | Discharge status: Home / Self Care | Payer: BC Managed Care – PPO | Attending: Internal Medicine | Admitting: Internal Medicine

## 2021-08-11 ENCOUNTER — Other Ambulatory Visit: Payer: Self-pay

## 2021-08-11 ENCOUNTER — Telehealth: Payer: BC Managed Care – PPO | Admitting: Nurse Practitioner

## 2021-08-11 ENCOUNTER — Encounter (HOSPITAL_COMMUNITY): Payer: Self-pay

## 2021-08-11 DIAGNOSIS — J9620 Acute and chronic respiratory failure, unspecified whether with hypoxia or hypercapnia: Secondary | ICD-10-CM

## 2021-08-11 DIAGNOSIS — M109 Gout, unspecified: Secondary | ICD-10-CM | POA: Diagnosis present

## 2021-08-11 DIAGNOSIS — Z6841 Body Mass Index (BMI) 40.0 and over, adult: Secondary | ICD-10-CM

## 2021-08-11 DIAGNOSIS — I4892 Unspecified atrial flutter: Secondary | ICD-10-CM | POA: Diagnosis present

## 2021-08-11 DIAGNOSIS — R0609 Other forms of dyspnea: Secondary | ICD-10-CM | POA: Diagnosis not present

## 2021-08-11 DIAGNOSIS — Z87891 Personal history of nicotine dependence: Secondary | ICD-10-CM | POA: Diagnosis not present

## 2021-08-11 DIAGNOSIS — G4733 Obstructive sleep apnea (adult) (pediatric): Secondary | ICD-10-CM | POA: Diagnosis not present

## 2021-08-11 DIAGNOSIS — R7303 Prediabetes: Secondary | ICD-10-CM | POA: Diagnosis not present

## 2021-08-11 DIAGNOSIS — Z79890 Hormone replacement therapy (postmenopausal): Secondary | ICD-10-CM

## 2021-08-11 DIAGNOSIS — J9811 Atelectasis: Secondary | ICD-10-CM | POA: Diagnosis not present

## 2021-08-11 DIAGNOSIS — I2510 Atherosclerotic heart disease of native coronary artery without angina pectoris: Secondary | ICD-10-CM | POA: Diagnosis not present

## 2021-08-11 DIAGNOSIS — E039 Hypothyroidism, unspecified: Secondary | ICD-10-CM | POA: Diagnosis present

## 2021-08-11 DIAGNOSIS — Z7901 Long term (current) use of anticoagulants: Secondary | ICD-10-CM | POA: Diagnosis not present

## 2021-08-11 DIAGNOSIS — Z79899 Other long term (current) drug therapy: Secondary | ICD-10-CM | POA: Diagnosis not present

## 2021-08-11 DIAGNOSIS — R0602 Shortness of breath: Secondary | ICD-10-CM | POA: Diagnosis not present

## 2021-08-11 DIAGNOSIS — Z7982 Long term (current) use of aspirin: Secondary | ICD-10-CM | POA: Diagnosis not present

## 2021-08-11 DIAGNOSIS — I5032 Chronic diastolic (congestive) heart failure: Secondary | ICD-10-CM | POA: Diagnosis present

## 2021-08-11 DIAGNOSIS — Z825 Family history of asthma and other chronic lower respiratory diseases: Secondary | ICD-10-CM | POA: Diagnosis not present

## 2021-08-11 DIAGNOSIS — R0902 Hypoxemia: Secondary | ICD-10-CM | POA: Diagnosis not present

## 2021-08-11 DIAGNOSIS — Z8249 Family history of ischemic heart disease and other diseases of the circulatory system: Secondary | ICD-10-CM | POA: Diagnosis not present

## 2021-08-11 DIAGNOSIS — Z20822 Contact with and (suspected) exposure to covid-19: Secondary | ICD-10-CM | POA: Diagnosis not present

## 2021-08-11 DIAGNOSIS — Z833 Family history of diabetes mellitus: Secondary | ICD-10-CM | POA: Diagnosis not present

## 2021-08-11 DIAGNOSIS — I110 Hypertensive heart disease with heart failure: Secondary | ICD-10-CM | POA: Diagnosis present

## 2021-08-11 DIAGNOSIS — R0600 Dyspnea, unspecified: Secondary | ICD-10-CM | POA: Diagnosis not present

## 2021-08-11 DIAGNOSIS — E785 Hyperlipidemia, unspecified: Secondary | ICD-10-CM | POA: Diagnosis present

## 2021-08-11 DIAGNOSIS — D509 Iron deficiency anemia, unspecified: Secondary | ICD-10-CM | POA: Diagnosis present

## 2021-08-11 DIAGNOSIS — J441 Chronic obstructive pulmonary disease with (acute) exacerbation: Principal | ICD-10-CM | POA: Diagnosis present

## 2021-08-11 DIAGNOSIS — J9621 Acute and chronic respiratory failure with hypoxia: Secondary | ICD-10-CM | POA: Diagnosis not present

## 2021-08-11 DIAGNOSIS — R059 Cough, unspecified: Secondary | ICD-10-CM | POA: Diagnosis not present

## 2021-08-11 DIAGNOSIS — J449 Chronic obstructive pulmonary disease, unspecified: Secondary | ICD-10-CM | POA: Diagnosis not present

## 2021-08-11 DIAGNOSIS — E782 Mixed hyperlipidemia: Secondary | ICD-10-CM | POA: Diagnosis present

## 2021-08-11 LAB — I-STAT CHEM 8, ED
BUN: 17 mg/dL (ref 6–20)
Calcium, Ion: 1.2 mmol/L (ref 1.15–1.40)
Chloride: 93 mmol/L — ABNORMAL LOW (ref 98–111)
Creatinine, Ser: 0.9 mg/dL (ref 0.61–1.24)
Glucose, Bld: 104 mg/dL — ABNORMAL HIGH (ref 70–99)
HCT: 34 % — ABNORMAL LOW (ref 39.0–52.0)
Hemoglobin: 11.6 g/dL — ABNORMAL LOW (ref 13.0–17.0)
Potassium: 4.5 mmol/L (ref 3.5–5.1)
Sodium: 136 mmol/L (ref 135–145)
TCO2: 36 mmol/L — ABNORMAL HIGH (ref 22–32)

## 2021-08-11 LAB — HEPATIC FUNCTION PANEL
ALT: 24 U/L (ref 0–44)
AST: 28 U/L (ref 15–41)
Albumin: 3.8 g/dL (ref 3.5–5.0)
Alkaline Phosphatase: 57 U/L (ref 38–126)
Bilirubin, Direct: 0.2 mg/dL (ref 0.0–0.2)
Indirect Bilirubin: 0.4 mg/dL (ref 0.3–0.9)
Total Bilirubin: 0.6 mg/dL (ref 0.3–1.2)
Total Protein: 6.4 g/dL — ABNORMAL LOW (ref 6.5–8.1)

## 2021-08-11 LAB — CBC WITH DIFFERENTIAL/PLATELET
Abs Immature Granulocytes: 0.03 10*3/uL (ref 0.00–0.07)
Basophils Absolute: 0.1 10*3/uL (ref 0.0–0.1)
Basophils Relative: 1 %
Eosinophils Absolute: 0.4 10*3/uL (ref 0.0–0.5)
Eosinophils Relative: 5 %
HCT: 34.2 % — ABNORMAL LOW (ref 39.0–52.0)
Hemoglobin: 10.1 g/dL — ABNORMAL LOW (ref 13.0–17.0)
Immature Granulocytes: 0 %
Lymphocytes Relative: 19 %
Lymphs Abs: 1.5 10*3/uL (ref 0.7–4.0)
MCH: 26.1 pg (ref 26.0–34.0)
MCHC: 29.5 g/dL — ABNORMAL LOW (ref 30.0–36.0)
MCV: 88.4 fL (ref 80.0–100.0)
Monocytes Absolute: 0.9 10*3/uL (ref 0.1–1.0)
Monocytes Relative: 11 %
Neutro Abs: 5.2 10*3/uL (ref 1.7–7.7)
Neutrophils Relative %: 64 %
Platelets: 229 10*3/uL (ref 150–400)
RBC: 3.87 MIL/uL — ABNORMAL LOW (ref 4.22–5.81)
RDW: 13.7 % (ref 11.5–15.5)
WBC: 8 10*3/uL (ref 4.0–10.5)
nRBC: 0 % (ref 0.0–0.2)

## 2021-08-11 LAB — RESP PANEL BY RT-PCR (FLU A&B, COVID) ARPGX2
Influenza A by PCR: NEGATIVE
Influenza B by PCR: NEGATIVE
SARS Coronavirus 2 by RT PCR: NEGATIVE

## 2021-08-11 MED ORDER — ALBUTEROL (5 MG/ML) CONTINUOUS INHALATION SOLN: 10.0000 mg/h | INHALATION_SOLUTION | Freq: Once | RESPIRATORY_TRACT | Status: DC

## 2021-08-11 MED ORDER — IOHEXOL 350 MG/ML SOLN
100.0000 mL | Freq: Once | INTRAVENOUS | Status: AC | PRN
  Administered 2021-08-11: 100 mL via INTRAVENOUS

## 2021-08-11 MED ORDER — METHYLPREDNISOLONE SODIUM SUCC 125 MG IJ SOLR
125.0000 mg | Freq: Once | INTRAMUSCULAR | Status: AC
  Administered 2021-08-11: 125 mg via INTRAVENOUS
  Filled 2021-08-11: qty 2

## 2021-08-11 MED ORDER — IBUPROFEN 200 MG PO TABS
600.0000 mg | ORAL_TABLET | Freq: Once | ORAL | Status: AC
  Administered 2021-08-11: 600 mg via ORAL
  Filled 2021-08-11: qty 3

## 2021-08-11 MED ORDER — ALBUTEROL SULFATE (2.5 MG/3ML) 0.083% IN NEBU
10.0000 mg | INHALATION_SOLUTION | Freq: Once | RESPIRATORY_TRACT | Status: AC
  Administered 2021-08-11: 10 mg via RESPIRATORY_TRACT
  Filled 2021-08-11: qty 12

## 2021-08-11 NOTE — Patient Instructions (Signed)
Based on what you shared with me, I feel your condition warrants further evaluation as soon as possible at an Emergency department.    NOTE: There will be NO CHARGE for this eVisit   If you are having a true medical emergency please call 911.      Emergency Department-[facility]  Get Driving Directions  [phone]  [address]  [place], [place] [address]  Open 24/7/365      [place] [facility] at [address]  Get Driving Directions  [address]  [place], [place] [address]  Open 24/7/365    Emergency Department- [facility]  Get Driving Directions  [phone]  [address]  [place], [place] [address]  Open 24/7/365      [facility] at [facility]  Get Driving Directions  [phone]  [address]  [place], [place] [address]  Open 24/7/365    [place]  Emergency Department- [facility]  Get Driving Directions  [phone]  [address]  [place], [place] [address]  Open 24/7/365    [place]  Emergency Department- [facility] [place]  Get Driving Directions  [address]  [place], [place] [address]  Open 24/7/365    [place]  Emergency Department- [facility]  Get Driving Directions  [phone]  [address]  [place], [place] [address]  Open 24/7/365    

## 2021-08-11 NOTE — ED Notes (Signed)
Pt states that he is becoming agitated with waiting.

## 2021-08-11 NOTE — ED Notes (Signed)
ED TO INPATIENT HANDOFF REPORT  ED Nurse Name and Phone #:   S Name/Age/Gender Ralph Mack 55 y.o. male Room/Bed: RESB/RESB  Code Status   Code Status: Prior  Home/SNF/Other Home Patient oriented to: self, place, time, and situation Is this baseline? Yes   Triage Complete: Triage complete  Chief Complaint COPD exacerbation ([place]) [J44.1]  Triage Note Pt arrives via pov. Pt reports cough over the past 3 weeks. O2 prior to arrival was in the 60s. O2 was 74 on room air during triage. Patient was place on 4lnc and o2 went to the 90s. Pt denies cp. Reports sob with exertion    Allergies No Known Allergies  Level of Care/Admitting Diagnosis ED Disposition     ED Disposition  Admit   Condition  --   Comment  Hospital Area: [facility] [[id number]]  Level of Care: Telemetry [5]  Admit to tele based on following criteria: Complex arrhythmia (Bradycardia/Tachycardia)  May admit patient to [facility] or [facility] if equivalent level of care is available:: Yes  Covid Evaluation: Asymptomatic Screening Protocol (No Symptoms)  Diagnosis: COPD exacerbation [facility]) [[id number]]  Admitting Physician: Shela Leff [[id number]]  Attending Physician: Shela Leff [[id number]]  Estimated length of stay: past midnight tomorrow  Certification:: I certify this patient will need inpatient services for at least 2 midnights          B Medical/Surgery History Past Medical History:  Diagnosis Date   Allergy    Arthritis    Hx of colonic polyp 11/13/2016   10/2016 7 mm ssp/a - recall 2023   Hypertension    Hypothyroidism    Lymphocytic colitis 04/19/2019   Mild CAD 2009   Obesity    OSA (obstructive sleep apnea) 04/20/2021   Paroxysmal atrial flutter ([place])    Prediabetes    Recurrent upper respiratory infection (URI)    Past Surgical History:  Procedure Laterality Date   A-FLUTTER ABLATION N/A 04/06/2021   Procedure: A-FLUTTER ABLATION;  Surgeon: Vickie Epley, MD;  Location: [facility];  Service: Cardiovascular;  Laterality: N/A;   COLONOSCOPY  2018   CORONARY ANGIOGRAM  2009     A IV Location/Drains/Wounds Patient Lines/Drains/Airways Status     Active Line/Drains/Airways     Name Placement date Placement time Site Days   Peripheral IV 08/11/21 20 G Anterior;Distal;Right;Upper Arm 08/11/21  1641  Arm  less than 1            Intake/Output Last 24 hours No intake or output data in the 24 hours ending 08/11/21 2346  Labs/Imaging Results for orders placed or performed during the hospital encounter of 08/11/21 (from the past 48 hour(s))  CBC with Differential     Status: Abnormal   Collection Time: 08/11/21  4:28 PM  Result Value Ref Range   WBC 8.0 4.0 - 10.5 K/uL   RBC 3.87 (L) 4.22 - 5.81 MIL/uL   Hemoglobin 10.1 (L) 13.0 - 17.0 g/dL   HCT 34.2 (L) 39.0 - 52.0 %   MCV 88.4 80.0 - 100.0 fL   MCH 26.1 26.0 - 34.0 pg   MCHC 29.5 (L) 30.0 - 36.0 g/dL   RDW 13.7 11.5 - 15.5 %   Platelets 229 150 - 400 K/uL   nRBC 0.0 0.0 - 0.2 %   Neutrophils Relative % 64 %   Neutro Abs 5.2 1.7 - 7.7 K/uL   Lymphocytes Relative 19 %   Lymphs Abs 1.5 0.7 - 4.0 K/uL  Monocytes Relative 11 %   Monocytes Absolute 0.9 0.1 - 1.0 K/uL   Eosinophils Relative 5 %   Eosinophils Absolute 0.4 0.0 - 0.5 K/uL   Basophils Relative 1 %   Basophils Absolute 0.1 0.0 - 0.1 K/uL   Immature Granulocytes 0 %   Abs Immature Granulocytes 0.03 0.00 - 0.07 K/uL    Comment: Performed at [facility], [place] [address] [place], [place] [address]  Hepatic function panel     Status: Abnormal   Collection Time: 08/11/21  4:28 PM  Result Value Ref Range   Total Protein 6.4 (L) 6.5 - 8.1 g/dL   Albumin 3.8 3.5 - 5.0 g/dL   AST 28 15 - 41 U/L   ALT 24 0 - 44 U/L   Alkaline Phosphatase 57 38 - 126 U/L   Total Bilirubin 0.6 0.3 - 1.2 mg/dL   Bilirubin, Direct 0.2 0.0 - 0.2 mg/dL   Indirect Bilirubin 0.4 0.3 - 0.9 mg/dL    Comment:  Performed at [facility], [place] [address], [place], [place] [address]  Resp Panel by RT-PCR (Flu A&B, Covid) Nasopharyngeal Swab     Status: None   Collection Time: 08/11/21  4:29 PM   Specimen: Nasopharyngeal Swab; Nasopharyngeal(NP) swabs in vial transport medium  Result Value Ref Range   SARS Coronavirus 2 by RT PCR NEGATIVE NEGATIVE    Comment: (NOTE) SARS-CoV-2 target nucleic acids are NOT DETECTED.  The SARS-CoV-2 RNA is generally detectable in upper respiratory specimens during the acute phase of infection. The lowest concentration of SARS-CoV-2 viral copies this assay can detect is 138 copies/mL. A negative result does not preclude SARS-Cov-2 infection and should not be used as the sole basis for treatment or other patient management decisions. A negative result may occur with  improper specimen collection/handling, submission of specimen other than nasopharyngeal swab, presence of viral mutation(s) within the areas targeted by this assay, and inadequate number of viral copies(<138 copies/mL). A negative result must be combined with clinical observations, patient history, and epidemiological information. The expected result is Negative.  Fact Sheet for Patients:  EntrepreneurPulse.com.au  Fact Sheet for Healthcare Providers:  IncredibleEmployment.be  This test is no t yet approved or cleared by the [place] FDA and  has been authorized for detection and/or diagnosis of SARS-CoV-2 by FDA under an Emergency Use Authorization (EUA). This EUA will remain  in effect (meaning this test can be used) for the duration of the COVID-19 declaration under Section 564(b)(1) of the Act, 21 U.S.C.section 360bbb-3(b)(1), unless the authorization is terminated  or revoked sooner.       Influenza A by PCR NEGATIVE NEGATIVE   Influenza B by PCR NEGATIVE NEGATIVE    Comment: (NOTE) The Xpert Xpress SARS-CoV-2/FLU/RSV plus assay is  intended as an aid in the diagnosis of influenza from Nasopharyngeal swab specimens and should not be used as a sole basis for treatment. Nasal washings and aspirates are unacceptable for Xpert Xpress SARS-CoV-2/FLU/RSV testing.  Fact Sheet for Patients: EntrepreneurPulse.com.au  Fact Sheet for Healthcare Providers: IncredibleEmployment.be  This test is not yet approved or cleared by the [place] FDA and has been authorized for detection and/or diagnosis of SARS-CoV-2 by FDA under an Emergency Use Authorization (EUA). This EUA will remain in effect (meaning this test can be used) for the duration of the COVID-19 declaration under Section 564(b)(1) of the Act, 21 U.S.C. section 360bbb-3(b)(1), unless the authorization is terminated or revoked.  Performed at [facility], [place] [address], [place], [place] [address]   Ralph Mack 8, ED     Status: Abnormal   Collection Time: 08/11/21  5:03 PM  Result Value Ref Range   Sodium 136 135 - 145 mmol/L   Potassium 4.5 3.5 - 5.1 mmol/L   Chloride 93 (L) 98 - 111 mmol/L   BUN 17 6 - 20 mg/dL   Creatinine, Ser 0.90 0.61 - 1.24 mg/dL   Glucose, Bld 104 (H) 70 - 99 mg/dL    Comment: Glucose reference range applies only to samples taken after fasting for at least 8 hours.   Calcium, Ion 1.20 1.15 - 1.40 mmol/L   TCO2 36 (H) 22 - 32 mmol/L   Hemoglobin 11.6 (L) 13.0 - 17.0 g/dL   HCT 34.0 (L) 39.0 - 52.0 %   CT Angio Chest PE W/Cm &/Or Wo Cm  Result Date: 08/11/2021 CLINICAL DATA:  Cough, hypoxia, dyspnea on exertion. PE suspected, high prob. EXAM: CT ANGIOGRAPHY CHEST WITH CONTRAST TECHNIQUE: Multidetector CT imaging of the chest was performed using the standard protocol during bolus administration of intravenous contrast. Multiplanar CT image reconstructions and MIPs were obtained to evaluate the vascular anatomy. CONTRAST:  154mL OMNIPAQUE IOHEXOL 350 MG/ML SOLN COMPARISON:   03/06/2021 FINDINGS: Cardiovascular: There is adequate opacification of the pulmonary arterial tree. There is no intraluminal filling defect identified to suggest acute pulmonary embolism. The central pulmonary arteries are of normal caliber. Moderate coronary artery calcification. Global cardiac size within normal limits. No pericardial effusion. The thoracic aorta is unremarkable. Mediastinum/Nodes: No enlarged mediastinal, hilar, or axillary lymph nodes. Thyroid gland, trachea, and esophagus demonstrate no significant findings. Lungs/Pleura: Lungs are clear. No pneumothorax or pleural effusion. The central airways are widely patent. Upper Abdomen: No acute abnormality. Musculoskeletal: The osseous structures are age-appropriate. No acute bone abnormality. No lytic or blastic bone lesion. Review of the MIP images confirms the above findings. IMPRESSION: No acute pulmonary embolism. Moderate multi-vessel coronary artery calcification. Electronically Signed   By: Fidela Salisbury M.D.   On: 08/11/2021 19:36   DG Chest Port 1 View  Result Date: 08/11/2021 CLINICAL DATA:  Dyspnea, cough. EXAM: PORTABLE CHEST 1 VIEW COMPARISON:  Chest x-ray 01/06/2021. FINDINGS: The heart size and mediastinal contours are within normal limits. Linear atelectasis is noted in the left lung base. The lungs are otherwise clear. Skeletal structures are unremarkable. IMPRESSION: No active disease. Electronically Signed   By: Ronney Asters M.D.   On: 08/11/2021 17:28    Pending Labs Unresulted Labs (From admission, onward)    None       Vitals/Pain Today's Vitals   08/11/21 2215 08/11/21 2245 08/11/21 2300 08/11/21 2320  BP: (!) 138/125 134/78 127/73 132/80  Pulse: 71 71 74 84  Resp: 19 18  19   Temp:      SpO2: 95% 97% 91% 99%  Weight:      Height:      PainSc:        Isolation Precautions No active isolations  Medications Medications  iohexol (OMNIPAQUE) 350 MG/ML injection 100 mL (100 mLs Intravenous Contrast  Given 08/11/21 1910)  methylPREDNISolone sodium succinate (SOLU-MEDROL) 125 mg/2 mL injection 125 mg (125 mg Intravenous Given 08/11/21 2006)  albuterol (PROVENTIL) (2.5 MG/3ML) 0.083% nebulizer solution 10 mg (10 mg Nebulization Given 08/11/21 2034)  ibuprofen (ADVIL) tablet 600 mg (600 mg Oral Given 08/11/21 2248)    Mobility walks Low fall risk   Focused Assessments    R Recommendations: See Admitting Provider Note  Report given to:   Additional Notes:

## 2021-08-11 NOTE — H&P (Signed)
History and Physical    Ralph Mack OIN:[id number] DOB: Jan 01, 1966 DOA: 08/11/2021  PCP: [name], PA-C Patient coming from: Home  Chief Complaint: Shortness of breath, cough  HPI: Ralph Mack is a 55 y.o. male with medical history significant of COPD, OSA on CPAP, paroxysmal atrial flutter, chronic diastolic CHF, CAD, hypertension, hyperlipidemia, hypothyroidism, lymphocytic colitis, prediabetes, morbid obesity (BMI 42.44).  Admitted in April 2022 for COPD exacerbation and discharged on 3 L supplemental oxygen.  Presents to the ED for evaluation of cough and shortness of breath.  Oxygen saturation in the 70s on room air on arrival, improved with 4 L supplemental oxygen.  Labs showing WBC 8.0, hemoglobin 10.1, MCV 88.4, platelet count 229k.  Sodium 136, potassium 4.5, chloride 93, bicarb 36, BUN 17, creatinine 0.9, glucose 104.  COVID and influenza PCR negative.  EKG without acute ischemic changes.  Chest x-ray showing no active disease.  CT angiogram chest negative for PE, pulmonary edema, or pneumonia. Patient was given albuterol nebulizer treatment, ibuprofen, and Solu-Medrol 125 mg.  Patient reports 3-week history of progressively worsening dyspnea, nonproductive cough, and wheezing.  Symptoms worse after he does yard work and starts coughing aggressively.  No fevers or chest pain.  He stopped smoking cigarettes in April.  He was discharged with home oxygen in April but did not require it longer than a month.  He is seen by pulmonology and currently on rescue inhaler only.  Does not have a maintenance inhaler.  Today his symptoms became worse and when he checked his oxygen saturation it was in the 60s.  He had a video visit with his PCPs office and it was advised to come into the ED to be evaluated.  Review of Systems:  All systems reviewed and apart from history of presenting illness, are negative.  Past Medical History:  Diagnosis Date   Allergy    Arthritis    Hx of colonic  polyp 11/13/2016   10/2016 7 mm ssp/a - recall 2023   Hypertension    Hypothyroidism    Lymphocytic colitis 04/19/2019   Mild CAD 2009   Obesity    OSA (obstructive sleep apnea) 04/20/2021   Paroxysmal atrial flutter (HCC)    Prediabetes    Recurrent upper respiratory infection (URI)     Past Surgical History:  Procedure Laterality Date   A-FLUTTER ABLATION N/A 04/06/2021   Procedure: A-FLUTTER ABLATION;  Surgeon: Vickie Epley, MD;  Location: [facility];  Service: Cardiovascular;  Laterality: N/A;   COLONOSCOPY  2018   CORONARY ANGIOGRAM  2009     reports that he quit smoking about 7 months ago. His smoking use included cigarettes. He has a 18.50 pack-year smoking history. He has never used smokeless tobacco. He reports current alcohol use of about 12.0 standard drinks per week. He reports that he does not use drugs.  No Known Allergies  Family History  Problem Relation Age of Onset   Asthma Mother    COPD Mother    Cancer Mother        Skin   Cancer - Other Mother        bone, liver, breast, lung   Emphysema Mother    Heart disease Father        Pacemaker   Parkinson's disease Father    Depression Father    Depression Daughter    OCD Daughter    Alcohol abuse Maternal Grandmother    Schizophrenia Maternal Grandmother    Diabetes Paternal Grandfather  Kidney disease Paternal Grandfather    Colon polyps Neg Hx    Colon cancer Neg Hx    Esophageal cancer Neg Hx    Stomach cancer Neg Hx    Rectal cancer Neg Hx     Prior to Admission medications   Medication Sig Start Date End Date Taking? Authorizing Provider  acetaminophen (TYLENOL) 650 MG CR tablet Take 1,300-1,950 mg by mouth 2 (two) times daily as needed for pain.   Yes [provider]  allopurinol (ZYLOPRIM) 300 MG tablet Take 1 tablet by mouth once daily Patient taking differently: Take 300 mg by mouth daily. 08/10/21  Yes [name], PA-C  aspirin EC 81 MG tablet Take 1 tablet (81 mg  total) by mouth daily. Swallow whole. 07/16/21  Yes Vickie Epley, MD  bismuth subsalicylate (PEPTO BISMOL) 262 MG chewable tablet Chew 524 mg by mouth daily as needed for indigestion.   Yes [provider]  busPIRone (BUSPAR) 15 MG tablet Take 1 tablet (15 mg total) by mouth 2 (two) times daily. Patient taking differently: Take 15 mg by mouth daily. 04/05/21 08/11/22 Yes Thayer Headings, PMHNP  Cholecalciferol (VITAMIN D3) 5000 units TABS 5,000 IU OTC vitamin D3 daily. Patient taking differently: Take 5,000 Units by mouth daily. 10/24/16  Yes [name], DO  Dextrose-Fructose-Sod Citrate Almon Hercules) [phone] MG CHEW Chew 1 tablet by mouth daily as needed (nausea).   Yes [provider]  dicyclomine (BENTYL) 20 MG tablet Take I tablet by mouth 4 times daily before meals and at bedtime as needed Patient taking differently: Take 20 mg by mouth See admin instructions. Take I tablet by mouth 2 to 3 times daily before meals and at bedtime as needed for irritable bowel syndrome 07/09/21  Yes Gatha Mayer, MD  diltiazem (CARDIZEM CD) 120 MG 24 hr capsule Take 1 capsule (120 mg total) by mouth daily. 02/08/21  Yes Minus Breeding, MD  diphenhydrAMINE HCl, Sleep, 50 MG CAPS Take 50 mg by mouth at bedtime as needed (for sleep).   Yes [provider]  EUTHYROX 50 MCG tablet Take 1 tablet by mouth once daily Patient taking differently: Take 50 mcg by mouth daily before breakfast. 03/22/21  Yes Abonza, Maritza, PA-C  fexofenadine (ALLEGRA) 180 MG tablet Take 180 mg by mouth daily as needed for allergies.   Yes [provider]  fluticasone (FLONASE) 50 MCG/ACT nasal spray Place 2 sprays into the nose daily.   Yes [provider]  hydrochlorothiazide (MICROZIDE) 12.5 MG capsule Take 1 capsule (12.5 mg total) by mouth daily. **PLEASE CONTACT OUR OFFICE TO SCHEDULE A FOLLOW UP FOR FUTURE MED REFILLS** 07/04/21  Yes Abonza, Maritza, PA-C  losartan (COZAAR) 50 MG  tablet Take 1 tablet (50 mg total) by mouth daily. **PLEASE CONTACT OUR OFFICE TO SCHEDULE A FOLLOW UP FOR FUTURE MED REFILLS** 07/31/21  Yes Abonza, Maritza, PA-C  oxymetazoline (AFRIN) 0.05 % nasal spray Place 1 spray into both nostrils 2 (two) times daily as needed for congestion.   Yes [provider]  Potassium 99 MG TABS Take 297-396 mg by mouth daily.   Yes [provider]  propranolol (INDERAL) 20 MG tablet Take 20 mg by mouth 4 (four) times daily. 07/06/21  Yes [provider]  Sodium Chloride-Sodium Bicarb (AYR SALINE NASAL RINSE NA) Place 1 Dose into the nose daily as needed (congestion).   Yes [provider]  tadalafil (CIALIS) 10 MG tablet TAKE 1 TABLET BY MOUTH ONCE DAILY AS NEEDED FOR ERECTILE  DYSFUNCTION Patient taking differently: Take 10 mg by mouth daily as needed for erectile dysfunction. 07/09/21  Yes Minus Breeding, MD  acetic acid-hydrocortisone (VOSOL-HC) OTIC solution Place 3 drops into both ears 2 (two) times daily. Patient not taking: Reported on 08/11/2021 04/24/21   [name], PA-C  albuterol (PROAIR HFA) 108 (90 Base) MCG/ACT inhaler Inhale 2 puffs into the lungs every 4 (four) hours as needed for wheezing or shortness of breath. 03/09/21   Martyn Ehrich, NP  tobramycin (TOBREX) 0.3 % ophthalmic solution Place 1 drop into the right eye every 4 (four) hours. Patient not taking: Reported on 08/11/2021 06/30/21   Jaynee Eagles, PA-C  apixaban (ELIQUIS) 5 MG TABS tablet Take 1 tablet (5 mg total) by mouth 2 (two) times daily. Patient not taking: No sig reported 01/10/21 04/06/21  Flora Lipps, MD    Physical Exam: Vitals:   08/11/21 2215 08/11/21 2245 08/11/21 2300 08/11/21 2320  BP: (!) 138/125 134/78 127/73 132/80  Pulse: 71 71 74 84  Resp: 19 18  19   Temp:      SpO2: 95% 97% 91% 99%  Weight:      Height:        Physical Exam Constitutional:      General: He is not in acute distress. HENT:     Head: Normocephalic  and atraumatic.  Eyes:     Extraocular Movements: Extraocular movements intact.     Conjunctiva/sclera: Conjunctivae normal.  Cardiovascular:     Rate and Rhythm: Normal rate and regular rhythm.     Pulses: Normal pulses.  Pulmonary:     Effort: Pulmonary effort is normal. No respiratory distress.     Breath sounds: No wheezing or rales.  Abdominal:     General: Bowel sounds are normal. There is no distension.     Palpations: Abdomen is soft.     Tenderness: There is no abdominal tenderness.  Musculoskeletal:        General: No swelling or tenderness.     Cervical back: Normal range of motion and neck supple.  Skin:    General: Skin is warm and dry.  Neurological:     General: No focal deficit present.     Mental Status: He is alert and oriented to person, place, and time.     Labs on Admission: I have personally reviewed following labs and imaging studies  CBC: Recent Labs  Lab 08/11/21 1628 08/11/21 1703  WBC 8.0  --   NEUTROABS 5.2  --   HGB 10.1* 11.6*  HCT 34.2* 34.0*  MCV 88.4  --   PLT 229  --    Basic Metabolic Panel: Recent Labs  Lab 08/11/21 1703  NA 136  K 4.5  CL 93*  GLUCOSE 104*  BUN 17  CREATININE 0.90   GFR: Estimated Creatinine Clearance: 115.7 mL/min (by C-G formula based on SCr of 0.9 mg/dL). Liver Function Tests: Recent Labs  Lab 08/11/21 1628  AST 28  ALT 24  ALKPHOS 57  BILITOT 0.6  PROT 6.4*  ALBUMIN 3.8   No results for input(s): LIPASE, AMYLASE in the last 168 hours. No results for input(s): AMMONIA in the last 168 hours. Coagulation Profile: No results for input(s): INR, PROTIME in the last 168 hours. Cardiac Enzymes: No results for input(s): CKTOTAL, CKMB, CKMBINDEX, TROPONINI in the last 168 hours. BNP (last 3 results) No results for input(s): PROBNP in the last 8760 hours. HbA1C: No results for input(s): HGBA1C in the last 72 hours. CBG:  No results for input(s): GLUCAP in the last 168 hours. Lipid Profile: No  results for input(s): CHOL, HDL, LDLCALC, TRIG, CHOLHDL, LDLDIRECT in the last 72 hours. Thyroid Function Tests: No results for input(s): TSH, T4TOTAL, FREET4, T3FREE, THYROIDAB in the last 72 hours. Anemia Panel: No results for input(s): VITAMINB12, FOLATE, FERRITIN, TIBC, IRON, RETICCTPCT in the last 72 hours. Urine analysis:    Component Value Date/Time   APPEARANCEUR Clear 06/06/2020 1052   LABSPEC 1.020 05/31/2015 1048   GLUCOSEU Negative 06/06/2020 1052   HGBUR NEGATIVE 05/31/2015 1048   BILIRUBINUR Negative 06/06/2020 1052   KETONESUR negative 05/07/2020 1507   PROTEINUR Negative 06/06/2020 1052   PROTEINUR NEGATIVE 05/31/2015 1048   UROBILINOGEN 0.2 05/07/2020 1507   NITRITE Negative 06/06/2020 1052   LEUKOCYTESUR Negative 06/06/2020 1052    Radiological Exams on Admission: CT Angio Chest PE W/Cm &/Or Wo Cm  Result Date: 08/11/2021 CLINICAL DATA:  Cough, hypoxia, dyspnea on exertion. PE suspected, high prob. EXAM: CT ANGIOGRAPHY CHEST WITH CONTRAST TECHNIQUE: Multidetector CT imaging of the chest was performed using the standard protocol during bolus administration of intravenous contrast. Multiplanar CT image reconstructions and MIPs were obtained to evaluate the vascular anatomy. CONTRAST:  198mL OMNIPAQUE IOHEXOL 350 MG/ML SOLN COMPARISON:  03/06/2021 FINDINGS: Cardiovascular: There is adequate opacification of the pulmonary arterial tree. There is no intraluminal filling defect identified to suggest acute pulmonary embolism. The central pulmonary arteries are of normal caliber. Moderate coronary artery calcification. Global cardiac size within normal limits. No pericardial effusion. The thoracic aorta is unremarkable. Mediastinum/Nodes: No enlarged mediastinal, hilar, or axillary lymph nodes. Thyroid gland, trachea, and esophagus demonstrate no significant findings. Lungs/Pleura: Lungs are clear. No pneumothorax or pleural effusion. The central airways are widely patent. Upper  Abdomen: No acute abnormality. Musculoskeletal: The osseous structures are age-appropriate. No acute bone abnormality. No lytic or blastic bone lesion. Review of the MIP images confirms the above findings. IMPRESSION: No acute pulmonary embolism. Moderate multi-vessel coronary artery calcification. Electronically Signed   By: Fidela Salisbury M.D.   On: 08/11/2021 19:36   DG Chest Port 1 View  Result Date: 08/11/2021 CLINICAL DATA:  Dyspnea, cough. EXAM: PORTABLE CHEST 1 VIEW COMPARISON:  Chest x-ray 01/06/2021. FINDINGS: The heart size and mediastinal contours are within normal limits. Linear atelectasis is noted in the left lung base. The lungs are otherwise clear. Skeletal structures are unremarkable. IMPRESSION: No active disease. Electronically Signed   By: Ronney Asters M.D.   On: 08/11/2021 17:28    EKG: Independently reviewed.  Sinus rhythm, baseline wander.  No acute changes.  Assessment/Plan Principal Problem:   COPD exacerbation (HCC) Active Problems:   Prediabetes   Hypothyroidism   Hyperlipidemia   Acute on chronic respiratory failure (HCC)   Acute hypoxemic respiratory failure secondary to acute COPD exacerbation Currently only on a rescue inhaler, no maintenance inhaler at home.  Oxygen saturation in the 70s on room air on arrival.  Currently satting well on 3 L supplemental oxygen, no respiratory distress.  COVID and influenza PCR negative.  EKG without acute ischemic changes.  CT angiogram chest negative for PE, pulmonary edema, or pneumonia. -Solu-Medrol 60 mg every 12 hours, DuoNeb every 6 hours, Pulmicort neb twice daily, albuterol neb as needed, and azithromycin.  Incentive spirometry and flutter valve.  Continue supplemental oxygen.  Normocytic anemia Hemoglobin 10.1, MCV 88.4.  Hemoglobin previously in the 13-14 range.  Patient is not endorsing any symptoms of active GI bleed. -Anemia panel, FOBT  OSA -Continue CPAP at  night  Paroxysmal atrial flutter Currently in  sinus rhythm. Followed by cardiology and no longer on anticoagulation. -Continue Cardizem and propranolol.  Chronic diastolic CHF Appears euvolemic at this time. -Continue home diuretic  CAD EKG without acute ischemic changes and not endorsing any chest pain. -Hold aspirin as FOBT currently pending.  Continue beta-blocker.  He is not on a statin, check lipid panel.  Hypertension Stable. -Continue Cardizem, propranolol, hydrochlorothiazide  Hypothyroidism -Continue Synthroid  Prediabetes A1c 5.6 in April 2022.  Gout -Continue allopurinol  DVT prophylaxis: SCDs.  No chemical DVT prophylaxis at this time as FOBT pending. Code Status: Full code Family Communication: No family available at this time. Disposition Plan: Status is: Inpatient  Remains inpatient appropriate because: COPD exacerbation and respiratory failure requiring supplemental oxygen Level of care: Level of care: Telemetry  The medical decision making on this patient was of high complexity and the patient is at high risk for clinical deterioration, therefore this is a level 3 visit.  Shela Leff MD [facility]  If 7PM-7AM, please contact night-coverage [email or web address]  08/11/2021, 11:56 PM

## 2021-08-11 NOTE — ED Triage Notes (Signed)
Pt arrives via pov. Pt reports cough over the past 3 weeks. O2 prior to arrival was in the 60s. O2 was 74 on room air during triage. Patient was place on 4lnc and o2 went to the 90s. Pt denies cp. Reports sob with exertion

## 2021-08-11 NOTE — Progress Notes (Signed)
Virtual Visit Consent   Ralph Mack, you are scheduled for a virtual visit with a [facility] provider today.     Just as with appointments in the office, your consent must be obtained to participate.  Your consent will be active for this visit and any virtual visit you may have with one of our providers in the next 365 days.     If you have a MyChart account, a copy of this consent can be sent to you electronically.  All virtual visits are billed to your insurance company just like a traditional visit in the office.    As this is a virtual visit, video technology does not allow for your provider to perform a traditional examination.  This may limit your provider's ability to fully assess your condition.  If your provider identifies any concerns that need to be evaluated in person or the need to arrange testing (such as labs, EKG, etc.), we will make arrangements to do so.     Although advances in technology are sophisticated, we cannot ensure that it will always work on either your end or our end.  If the connection with a video visit is poor, the visit may have to be switched to a telephone visit.  With either a video or telephone visit, we are not always able to ensure that we have a secure connection.     I need to obtain your verbal consent now.   Are you willing to proceed with your visit today?    Ralph Mack has provided verbal consent on 08/11/2021 for a virtual visit (video or telephone).   Apolonio Schneiders, FNP   Date: 08/11/2021 12:27 PM   Virtual Visit via Video Note   I, Apolonio Schneiders, connected with  Ralph Mack  ([id number], [birth year]) on 08/11/21 at 12:30 PM EST by a video-enabled telemedicine application and verified that I am speaking with the correct person using two identifiers.  Location: Patient: Virtual Visit Location Patient: Home Provider: Virtual Visit Location Provider: Office/Clinic   I discussed the limitations of evaluation and management by telemedicine  and the availability of in person appointments. The patient expressed understanding and agreed to proceed.    History of Present Illness: Ralph Mack is a 55 y.o. who identifies as a male who was assigned male at birth, and is being seen today with complaints of cough and tiredness.   He was treated for bronchitis/pneumonia in March and was hospitalized for 5 days and continued on oxygen for 30 days.   He has now had a dry cough for the past month  He uses CPAP at night   His cough wakes him up at night.   Quite smoking in April  Has a Sp)2 device that is reading 68%. His average is 88% on a daily basis.  Checked machine on his wife and was able to get a normal reading.  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 05/25/2019  Orthostatic lightheadedness 05/25/2019   Lymphocytic colitis 04/19/2019   Hyperlipidemia 01/06/2019   Mood disorder ([place]) 10/05/2018   Dyslipidemia 09/11/2018   Essential hypertension 09/11/2018   Chest pain 09/11/2018   HTN (hypertension), malignant 09/02/2018   Stress-related physiological response affecting physical condition 01-Sep-2018   Death of family member September 01, 2018   Sleeping difficulty 08/17/2018   Acute reaction to situational stress 08/17/2018   Irritability and anger 08/17/2018   Low testosterone in male 08/17/2018   White coat syndrome without diagnosis of hypertension 10/14/2017   Hx of  colonic polyp 11/13/2016   Hypothyroidism 10/24/2016   Urinary frequency 10/24/2016   Fatigue 10/24/2016   OAB (overactive bladder) 10/24/2016   Low libido 09/26/2016   Environmental and seasonal allergies 09/26/2016   Acute alcohol overingestion, uncomplicated ([place])- fell recently 09/26/2016   History of substance abuse ([place]) 09/26/2016   Hematochezia 09/26/2016   Family hx of colon cancer requiring screening colonoscopy 09/26/2016   Prediabetes 09/05/2015   Vitamin D deficiency 09/05/2015   Hypertriglyceridemia 09/05/2015   Gout 07/26/2015   Obesity, Class II, BMI 35-39.9, isolated 07/26/2015   Former smoker 07/26/2015   Recurrent sinusitis 07/26/2015   Cervical disc disease 07/26/2015    Allergies: No Known Allergies Medications:  Current Outpatient Medications:    acetaminophen (TYLENOL) 650 MG CR tablet, Take 1,300-1,950 mg by mouth 2 (two) times daily as needed for pain., Disp: , Rfl:    acetic acid-hydrocortisone (VOSOL-HC) OTIC solution, Place 3 drops into both ears 2 (two) times daily., Disp: 10 mL, Rfl: 0   albuterol (PROAIR HFA) 108 (90 Base) MCG/ACT inhaler, Inhale 2 puffs into the lungs every 4 (four) hours as needed for wheezing or shortness of breath., Disp: 18 g, Rfl: 3   allopurinol (ZYLOPRIM) 300 MG tablet, Take 1 tablet by mouth once daily, Disp: 90 tablet, Rfl: 0   aspirin EC 81 MG tablet, Take 1 tablet (81 mg total) by mouth daily. Swallow whole., Disp: 90 tablet, Rfl: 3   bismuth subsalicylate (PEPTO BISMOL) 262 MG chewable tablet, Chew 524 mg by mouth daily as needed for indigestion., Disp: , Rfl:    busPIRone (BUSPAR) 15 MG tablet, Take 1 tablet (15 mg total) by mouth 2 (two) times daily., Disp: 180 tablet, Rfl: 1   Cholecalciferol (VITAMIN D3) 5000 units TABS, 5,000 IU OTC vitamin D3 daily., Disp: 90 tablet, Rfl: 3   Dextrose-Fructose-Sod Citrate (NAUZENE) 5176543784 MG CHEW, Chew 1 tablet by mouth daily as needed (nausea)., Disp: , Rfl:    dicyclomine  (BENTYL) 20 MG tablet, Take I tablet by mouth 4 times daily before meals and at bedtime as needed, Disp: 360 tablet, Rfl: 0   diltiazem (CARDIZEM CD) 120 MG 24 hr capsule, Take 1 capsule (120 mg total) by mouth daily., Disp: 90 capsule, Rfl: 3   diphenhydrAMINE HCl, Sleep, 50 MG CAPS, Take 50 mg by mouth at bedtime., Disp: , Rfl:    EUTHYROX 50 MCG tablet, Take 1 tablet by mouth once daily, Disp: 90 tablet, Rfl: 0   fexofenadine (ALLEGRA) 180 MG tablet, Take 180 mg by mouth daily., Disp: , Rfl:    fluticasone (FLONASE) 50 MCG/ACT nasal spray, Place 2 sprays into the nose daily., Disp: , Rfl:    hydrochlorothiazide (MICROZIDE) 12.5 MG capsule, Take 1 capsule (12.5 mg total) by mouth daily. **PLEASE CONTACT OUR OFFICE TO SCHEDULE A FOLLOW UP FOR FUTURE MED REFILLS**, Disp: 90 capsule, Rfl: 0   losartan (COZAAR) 50 MG tablet, Take 1 tablet (50 mg total)  by mouth daily. **PLEASE CONTACT OUR OFFICE TO SCHEDULE A FOLLOW UP FOR FUTURE MED REFILLS**, Disp: 90 tablet, Rfl: 0   oxymetazoline (AFRIN) 0.05 % nasal spray, Place 1 spray into both nostrils 2 (two) times daily as needed for congestion., Disp: , Rfl:    Potassium 99 MG TABS, Take 297-396 mg by mouth daily., Disp: , Rfl:    Sodium Chloride-Sodium Bicarb (AYR SALINE NASAL RINSE NA), Place 1 Dose into the nose daily as needed (congestion)., Disp: , Rfl:    tadalafil (CIALIS) 10 MG tablet, TAKE 1 TABLET BY MOUTH ONCE DAILY AS NEEDED FOR ERECTILE DYSFUNCTION, Disp: 10 tablet, Rfl: 0   tobramycin (TOBREX) 0.3 % ophthalmic solution, Place 1 drop into the right eye every 4 (four) hours., Disp: 5 mL, Rfl: 0  Observations/Objective: Patient is well-developed, well-nourished in no acute distress.  Resting comfortably in his truck  Head is normocephalic, atraumatic.  Expiratory wheeze, SOB with speech. Able to talk and complete sentences  Speech is clear and coherent with logical content.  Patient is alert and oriented at baseline.    Assessment and  Plan: 1. Shortness of breath Based on what you shared with me, I feel your condition warrants further evaluation as soon as possible at an Emergency department.    NOTE: There will be NO CHARGE for this eVisit   If you are having a true medical emergency please call 911.      Emergency [facility]  Get Driving Directions  [phone]  [address]  [place], [place] [address]  Open 24/7/365      [facility] Emergency Department at [place]  [address]  [place], [place] [address]  Open 24/7/365    Emergency [facility]  Get Driving Directions  [phone]  [address], [place] [address]  Open 24/7/365      [facility]  Get Driving Directions  [phone]  [address]  [place], [place] [address]  Open 24/7/365    [facility]  Emergency [place]  Get Driving Directions  [phone]  [place], [place] [address]  Open 24/7/365    [facility]  Emergency Department- [place]  Get Driving Directions  [address]  [place], [place] [address]  Open 24/7/365    [facility]  Emergency [facility]  Get Driving Directions  [phone]  [address]  [place],  [address]  Open 24/7/365        Follow Up Instructions: I discussed the assessment and treatment plan with the patient. The patient was provided an opportunity to ask questions and all were answered. The patient agreed with the plan and demonstrated an understanding of the instructions.  A copy of instructions were sent to the patient via MyChart unless otherwise noted below.   Patient is agreeable to go to ED now, provided work note for him to leave immediately   Time:  I spent 20 minutes with the patient via  telehealth technology discussing the above problems/concerns.    Apolonio Schneiders, FNP

## 2021-08-11 NOTE — ED Provider Notes (Signed)
[facility] DEPT Provider Note   CSN: [id number] Arrival date & time: 08/11/21  1549     History Chief Complaint  Patient presents with   Cough   hypoxia    Ralph Mack is a 55 y.o. male.  HPI He presents for evaluation shortness of breath with cough for several days.  He checked his oxygen saturation at home and it was 66.  He talked to a medical provider by telemetry who suggested that he come for evaluation.  He is taking his usual medicines for hypertension.  He uses his CPAP regularly.  He is not a smoker.  He denies productive cough, chest pain, weakness or dizziness at this time.  Patient has a history of similar problems in the past, was admitted to the hospital, treated for COPD exacerbation and discharged home with oxygen for 30 days.  He also had pulmonary work-up including evaluation of nodules with anticipated periodic follow-up in the future there are no other known active modifying factors.    Past Medical History:  Diagnosis Date   Allergy    Arthritis    Hx of colonic polyp 11/13/2016   10/2016 7 mm ssp/a - recall 2023   Hypertension    Hypothyroidism    Lymphocytic colitis 04/19/2019   Mild CAD 2009   Obesity    OSA (obstructive sleep apnea) 04/20/2021   Paroxysmal atrial flutter (HCC)    Prediabetes    Recurrent upper respiratory infection (URI)     Patient Active Problem List   Diagnosis Date Noted   OSA (obstructive sleep apnea) 04/20/2021   COPD (chronic obstructive pulmonary disease) ([place]) 03/08/2021   Multiple pulmonary nodules 03/08/2021   Typical atrial flutter ([place]) 02/08/2021   Acute pain of left knee 02/01/2021   Cough 12/19/2020   Wheezing 12/19/2020   Simple chronic bronchitis ([place]) 12/18/2020   Morbid (severe) obesity due to excess calories ([place]) 12/18/2020   CAD (coronary artery disease) 02/08/2020   Rapid weight gain 10/25/2019   Head injury 06/24/2019   ?able Irritable bowel syndrome with primarily  diarrhea 05/25/2019   Orthostatic lightheadedness 05/25/2019   Lymphocytic colitis 04/19/2019   Hyperlipidemia 01/06/2019   Mood disorder ([place]) 10/05/2018   Dyslipidemia 09/11/2018   Essential hypertension 09/11/2018   Chest pain 09/11/2018   HTN (hypertension), malignant 09/02/2018   Stress-related physiological response affecting physical condition August 30, 2018   Death of family member 2018-08-30   Sleeping difficulty 08/17/2018   Acute reaction to situational stress 08/17/2018   Irritability and anger 08/17/2018   Low testosterone in male 08/17/2018   White coat syndrome without diagnosis of hypertension 10/14/2017   Hx of colonic polyp 11/13/2016   Hypothyroidism 10/24/2016   Urinary frequency 10/24/2016   Fatigue 10/24/2016   OAB (overactive bladder) 10/24/2016   Low libido 09/26/2016   Environmental and seasonal allergies 09/26/2016   Acute alcohol overingestion, uncomplicated ([place])- fell recently 09/26/2016   History of substance abuse ([place]) 09/26/2016   Hematochezia 09/26/2016   Family hx of colon cancer requiring screening colonoscopy 09/26/2016   Prediabetes 09/05/2015   Vitamin D deficiency 09/05/2015   Hypertriglyceridemia 09/05/2015   Gout 07/26/2015   Obesity, Class II, BMI 35-39.9, isolated 07/26/2015   Former smoker 07/26/2015   Recurrent sinusitis 07/26/2015   Cervical disc disease 07/26/2015    Past Surgical History:  Procedure Laterality Date   A-FLUTTER ABLATION N/A 04/06/2021   Procedure: A-FLUTTER ABLATION;  Surgeon: Vickie Epley, MD;  Location: [facility];  Service:  Cardiovascular;  Laterality: N/A;   COLONOSCOPY  2018   CORONARY ANGIOGRAM  2009       Family History  Problem Relation Age of Onset   Asthma Mother    COPD Mother    Cancer Mother        Skin   Cancer - Other Mother        bone, liver, breast, lung   Emphysema Mother    Heart disease Father        Pacemaker   Parkinson's disease Father    Depression Father     Depression Daughter    OCD Daughter    Alcohol abuse Maternal Grandmother    Schizophrenia Maternal Grandmother    Diabetes Paternal Grandfather    Kidney disease Paternal Grandfather    Colon polyps Neg Hx    Colon cancer Neg Hx    Esophageal cancer Neg Hx    Stomach cancer Neg Hx    Rectal cancer Neg Hx     Social History   Tobacco Use   Smoking status: Former    Packs/day: 0.50    Years: 37.00    Pack years: 18.50    Types: Cigarettes    Quit date: 01/10/2021    Years since quitting: 0.5   Smokeless tobacco: Never   Tobacco comments:    not smoking currently-[place] 02/20/21  Vaping Use   Vaping Use: Former  Substance Use Topics   Alcohol use: Yes    Alcohol/week: 12.0 standard drinks    Types: 12 Cans of beer per week   Drug use: No    Home Medications Prior to Admission medications   Medication Sig Start Date End Date Taking? Authorizing Provider  acetaminophen (TYLENOL) 650 MG CR tablet Take 1,300-1,950 mg by mouth 2 (two) times daily as needed for pain.   Yes [provider]  allopurinol (ZYLOPRIM) 300 MG tablet Take 1 tablet by mouth once daily Patient taking differently: Take 300 mg by mouth daily. 08/10/21  Yes [name], PA-C  aspirin EC 81 MG tablet Take 1 tablet (81 mg total) by mouth daily. Swallow whole. 07/16/21  Yes Vickie Epley, MD  bismuth subsalicylate (PEPTO BISMOL) 262 MG chewable tablet Chew 524 mg by mouth daily as needed for indigestion.   Yes [provider]  busPIRone (BUSPAR) 15 MG tablet Take 1 tablet (15 mg total) by mouth 2 (two) times daily. Patient taking differently: Take 15 mg by mouth daily. 04/05/21 08/11/22 Yes Thayer Headings, PMHNP  Cholecalciferol (VITAMIN D3) 5000 units TABS 5,000 IU OTC vitamin D3 daily. Patient taking differently: Take 5,000 Units by mouth daily. 10/24/16  Yes [name], DO  Dextrose-Fructose-Sod Citrate Almon [place]) [phone] MG CHEW Chew 1 tablet by mouth daily as needed (nausea).    Yes [provider]  dicyclomine (BENTYL) 20 MG tablet Take I tablet by mouth 4 times daily before meals and at bedtime as needed Patient taking differently: Take 20 mg by mouth See admin instructions. Take I tablet by mouth 2 to 3 times daily before meals and at bedtime as needed for irritable bowel syndrome 07/09/21  Yes Gatha Mayer, MD  diltiazem (CARDIZEM CD) 120 MG 24 hr capsule Take 1 capsule (120 mg total) by mouth daily. 02/08/21  Yes Minus Breeding, MD  diphenhydrAMINE HCl, Sleep, 50 MG CAPS Take 50 mg by mouth at bedtime as needed (for sleep).   Yes [provider]  EUTHYROX 50 MCG tablet Take 1 tablet by mouth once daily Patient  taking differently: Take 50 mcg by mouth daily before breakfast. 03/22/21  Yes Abonza, Maritza, PA-C  fexofenadine (ALLEGRA) 180 MG tablet Take 180 mg by mouth daily as needed for allergies.   Yes [provider]  fluticasone (FLONASE) 50 MCG/ACT nasal spray Place 2 sprays into the nose daily.   Yes [provider]  hydrochlorothiazide (MICROZIDE) 12.5 MG capsule Take 1 capsule (12.5 mg total) by mouth daily. **PLEASE CONTACT OUR OFFICE TO SCHEDULE A FOLLOW UP FOR FUTURE MED REFILLS** 07/04/21  Yes Abonza, Maritza, PA-C  losartan (COZAAR) 50 MG tablet Take 1 tablet (50 mg total) by mouth daily. **PLEASE CONTACT OUR OFFICE TO SCHEDULE A FOLLOW UP FOR FUTURE MED REFILLS** 07/31/21  Yes Abonza, Maritza, PA-C  oxymetazoline (AFRIN) 0.05 % nasal spray Place 1 spray into both nostrils 2 (two) times daily as needed for congestion.   Yes [provider]  Potassium 99 MG TABS Take 297-396 mg by mouth daily.   Yes [provider]  propranolol (INDERAL) 20 MG tablet Take 20 mg by mouth 4 (four) times daily. 07/06/21  Yes [provider]  Sodium Chloride-Sodium Bicarb (AYR SALINE NASAL RINSE NA) Place 1 Dose into the nose daily as needed (congestion).   Yes [provider]  tadalafil (CIALIS) 10 MG tablet  TAKE 1 TABLET BY MOUTH ONCE DAILY AS NEEDED FOR ERECTILE DYSFUNCTION Patient taking differently: Take 10 mg by mouth daily as needed for erectile dysfunction. 07/09/21  Yes Minus Breeding, MD  acetic acid-hydrocortisone (VOSOL-HC) OTIC solution Place 3 drops into both ears 2 (two) times daily. Patient not taking: Reported on 08/11/2021 04/24/21   [name], PA-C  albuterol (PROAIR HFA) 108 (90 Base) MCG/ACT inhaler Inhale 2 puffs into the lungs every 4 (four) hours as needed for wheezing or shortness of breath. 03/09/21   Martyn Ehrich, NP  tobramycin (TOBREX) 0.3 % ophthalmic solution Place 1 drop into the right eye every 4 (four) hours. Patient not taking: Reported on 08/11/2021 06/30/21   Jaynee Eagles, PA-C  apixaban (ELIQUIS) 5 MG TABS tablet Take 1 tablet (5 mg total) by mouth 2 (two) times daily. Patient not taking: No sig reported 01/10/21 04/06/21  Flora Lipps, MD    Allergies    Patient has no known allergies.  Review of Systems   Review of Systems  All other systems reviewed and are negative.  Physical Exam Updated Vital Signs BP 127/73   Pulse 74   Temp 99.2 F (37.3 C)   Resp 18   Ht 5' 7.5" (1.715 m)   Wt 124.7 kg   SpO2 91%   BMI 42.44 kg/m   Physical Exam Vitals and nursing note reviewed.  Constitutional:      General: He is not in acute distress.    Appearance: He is well-developed. He is obese. He is not ill-appearing, toxic-appearing or diaphoretic.  HENT:     Head: Normocephalic and atraumatic.     Right Ear: External ear normal.     Left Ear: External ear normal.  Eyes:     Conjunctiva/sclera: Conjunctivae normal.     Pupils: Pupils are equal, round, and reactive to light.  Neck:     Trachea: Phonation normal.  Cardiovascular:     Rate and Rhythm: Normal rate and regular rhythm.     Heart sounds: Normal heart sounds.  Pulmonary:     Effort: Pulmonary effort is normal. No respiratory distress.     Breath sounds: Normal breath sounds. No  stridor.  Comments: He is tachypneic Abdominal:     Palpations: Abdomen is soft.     Tenderness: There is no abdominal tenderness.  Musculoskeletal:        General: No swelling or tenderness. Normal range of motion.     Cervical back: Normal range of motion and neck supple.     Right lower leg: No edema.     Left lower leg: No edema.  Skin:    General: Skin is warm and dry.  Neurological:     Mental Status: He is alert and oriented to person, place, and time.     Cranial Nerves: No cranial nerve deficit.     Sensory: No sensory deficit.     Motor: No abnormal muscle tone.     Coordination: Coordination normal.  Psychiatric:        Behavior: Behavior normal.        Thought Content: Thought content normal.        Judgment: Judgment normal.    ED Results / Procedures / Treatments   Labs (all labs ordered are listed, but only abnormal results are displayed) Labs Reviewed  CBC WITH DIFFERENTIAL/PLATELET - Abnormal; Notable for the following components:      Result Value   RBC 3.87 (*)    Hemoglobin 10.1 (*)    HCT 34.2 (*)    MCHC 29.5 (*)    All other components within normal limits  HEPATIC FUNCTION PANEL - Abnormal; Notable for the following components:   Total Protein 6.4 (*)    All other components within normal limits  I-STAT CHEM 8, ED - Abnormal; Notable for the following components:   Chloride 93 (*)    Glucose, Bld 104 (*)    TCO2 36 (*)    Hemoglobin 11.6 (*)    HCT 34.0 (*)    All other components within normal limits  RESP PANEL BY RT-PCR (FLU A&B, COVID) [id number]    EKG EKG Interpretation  Date/Time:  Saturday August 11 2021 19:23:56 EST Ventricular Rate:  69 PR Interval:  187 QRS Duration: 126 QT Interval:  432 QTC Calculation: 463 R Axis:   -69 Text Interpretation: Sinus rhythm Atrial premature complexes in couplets Left bundle branch block since last tracing no significant change Confirmed by Daleen Bo [id number] on 08/11/2021 7:33:12  PM  Radiology CT Angio Chest PE W/Cm &/Or Wo Cm  Result Date: 08/11/2021 CLINICAL DATA:  Cough, hypoxia, dyspnea on exertion. PE suspected, high prob. EXAM: CT ANGIOGRAPHY CHEST WITH CONTRAST TECHNIQUE: Multidetector CT imaging of the chest was performed using the standard protocol during bolus administration of intravenous contrast. Multiplanar CT image reconstructions and MIPs were obtained to evaluate the vascular anatomy. CONTRAST:  148mL OMNIPAQUE IOHEXOL 350 MG/ML SOLN COMPARISON:  03/06/2021 FINDINGS: Cardiovascular: There is adequate opacification of the pulmonary arterial tree. There is no intraluminal filling defect identified to suggest acute pulmonary embolism. The central pulmonary arteries are of normal caliber. Moderate coronary artery calcification. Global cardiac size within normal limits. No pericardial effusion. The thoracic aorta is unremarkable. Mediastinum/Nodes: No enlarged mediastinal, hilar, or axillary lymph nodes. Thyroid gland, trachea, and esophagus demonstrate no significant findings. Lungs/Pleura: Lungs are clear. No pneumothorax or pleural effusion. The central airways are widely patent. Upper Abdomen: No acute abnormality. Musculoskeletal: The osseous structures are age-appropriate. No acute bone abnormality. No lytic or blastic bone lesion. Review of the MIP images confirms the above findings. IMPRESSION: No acute pulmonary embolism. Moderate multi-vessel coronary artery calcification. Electronically Signed   By: Cassandria Anger  Christa See M.D.   On: 08/11/2021 19:36   DG Chest Port 1 View  Result Date: 08/11/2021 CLINICAL DATA:  Dyspnea, cough. EXAM: PORTABLE CHEST 1 VIEW COMPARISON:  Chest x-ray 01/06/2021. FINDINGS: The heart size and mediastinal contours are within normal limits. Linear atelectasis is noted in the left lung base. The lungs are otherwise clear. Skeletal structures are unremarkable. IMPRESSION: No active disease. Electronically Signed   By: Ronney Asters M.D.   On:  08/11/2021 17:28    Procedures .Critical Care Performed by: Daleen Bo, MD Authorized by: Daleen Bo, MD   Critical care provider statement:    Critical care time (minutes):  45   Critical care start time:  08/11/2021 4:15 AM   Critical care end time:  08/11/2021 11:18 PM   Critical care time was exclusive of:  Separately billable procedures and treating other patients   Critical care was necessary to treat or prevent imminent or life-threatening deterioration of the following conditions:  Respiratory failure   Critical care was time spent personally by me on the following activities:  Blood draw for specimens, development of treatment plan with patient or surrogate, discussions with consultants, evaluation of patient's response to treatment, examination of patient, ordering and performing treatments and interventions, ordering and review of laboratory studies, ordering and review of radiographic studies, pulse oximetry, re-evaluation of patient's condition and review of old charts   Medications Ordered in ED Medications  iohexol (OMNIPAQUE) 350 MG/ML injection 100 mL (100 mLs Intravenous Contrast Given 08/11/21 1910)  methylPREDNISolone sodium succinate (SOLU-MEDROL) 125 mg/2 mL injection 125 mg (125 mg Intravenous Given 08/11/21 2006)  albuterol (PROVENTIL) (2.5 MG/3ML) 0.083% nebulizer solution 10 mg (10 mg Nebulization Given 08/11/21 2034)  ibuprofen (ADVIL) tablet 600 mg (600 mg Oral Given 08/11/21 2248)    ED Course  I have reviewed the triage vital signs and the nursing notes.  Pertinent labs & imaging results that were available during my care of the patient were reviewed by me and considered in my medical decision making (see chart for details).  Clinical Course as of 08/11/21 2318  Sat Aug 11, 2021  1628 Oxygen saturation normal now on 3 L nasal cannula; 100%.  Was initially abnormal, 70% on room air on arrival [EW]  1932 He returned from CT and was hypoxic,  apparently was done on oxygen at that time.  He rapidly had improvement of saturation when placed back on oxygen at 3 L per nasal cannula.  EKG was done, see report. [EW]  2303 Observation of oxygen for 15 minutes.  Patient with periods of hypoxia, on room air to 78%.  With deep breathing and talking his oxygen saturation goes above 90%.  It then tends to lower, relatively quickly to hypoxic state. [EW]    Clinical Course User Index [EW] Daleen Bo, MD   MDM Rules/Calculators/A&P                         Persistent hypoxia on room air   Patient Vitals for the past 24 hrs:  BP Temp Pulse Resp SpO2 Height Weight  08/11/21 2300 127/73 -- 74 -- 91 % -- --  08/11/21 2245 134/78 -- 71 18 97 % -- --  08/11/21 2215 (!) 138/125 -- 71 19 95 % -- --  08/11/21 2130 114/79 -- 64 (!) 24 100 % -- --  08/11/21 2100 134/86 -- 67 18 100 % -- --  08/11/21 2045 -- -- 66 17 100 % -- --  08/11/21 2035 -- -- -- -- 100 % -- --  08/11/21 2030 131/63 -- (!) 57 20 98 % -- --  08/11/21 1945 140/73 -- 62 20 98 % -- --  08/11/21 1930 122/74 -- 71 (!) 24 98 % -- --  08/11/21 1900 (!) 131/99 -- 69 (!) 23 94 % -- --  08/11/21 1815 131/79 -- 64 17 99 % -- --  08/11/21 1745 (!) 148/88 -- 65 20 100 % -- --  08/11/21 1715 (!) 136/92 -- 65 (!) 23 100 % -- --  08/11/21 1616 -- -- -- -- -- 5' 7.5" (1.715 m) 124.7 kg  08/11/21 1605 -- -- -- -- 94 % -- --  08/11/21 1600 (!) 141/82 99.2 F (37.3 C) 67 (!) 29 (!) 74 % -- --    11:18 PM Reevaluation with update and discussion. After initial assessment and treatment, an updated evaluation reveals persistent hypoxia on room air.Daleen Bo   Medical Decision Making:  This patient is presenting for evaluation of shortness of breath and low oxygen saturation, which does require a range of treatment options, and is a complaint that involves a high risk of morbidity and mortality. The differential diagnoses include PE, CHF, bronchitis, COPD exacerbation. I decided to  review old records, and in summary millage male presenting for evaluation of shortness of breath with hypoxia, he has history of COPD and has required oxygen in the past.  Symptoms worsened after working outside raking leaves.  He has chronic stable sleep apnea using CPAP at home.  I did not require additional historical information from anyone.  Clinical Laboratory Tests Ordered, included CBC, Metabolic panel, and viral panel . Review indicates normal except glucose high, total CO2 high, hemoglobin low, total protein low. Radiologic Tests Ordered, included chest x-ray, CT angiogram chest.  I independently Visualized: Radiograph images, which show no acute abnormalities/no PE  Cardiac Monitor Tracing which shows normal sinus rhythm     Critical Interventions-clinical evaluation, laboratory testing, radiography, medication treatment, observation and reassessment  After These Interventions, the Patient was reevaluated and was found with hypoxia, persistent, despite treatment.  No evidence for pneumonia, PE, CHF or ACS.  Suspect COPD exacerbation with hypoxia.  No acute respiratory distress.  He has previously required oxygen in the past, but not currently using it at home.  CRITICAL CARE-yes Performed by: Daleen Bo  Nursing Notes Reviewed/ Care Coordinated Applicable Imaging Reviewed Interpretation of Laboratory Data incorporated into ED treatment   11:18 PM-Consult complete with hospitalist. Patient case explained and discussed.  She agrees to admit patient for further evaluation and treatment. Call ended at 11:33 PM  Final Clinical Impression(s) / ED Diagnoses Final diagnoses:  COPD exacerbation ([place])  Hypoxia    Rx / DC Orders ED Discharge Orders     None        Daleen Bo, MD 08/11/21 2334

## 2021-08-12 DIAGNOSIS — E039 Hypothyroidism, unspecified: Secondary | ICD-10-CM

## 2021-08-12 DIAGNOSIS — E785 Hyperlipidemia, unspecified: Secondary | ICD-10-CM

## 2021-08-12 DIAGNOSIS — J9621 Acute and chronic respiratory failure with hypoxia: Secondary | ICD-10-CM | POA: Diagnosis not present

## 2021-08-12 DIAGNOSIS — J441 Chronic obstructive pulmonary disease with (acute) exacerbation: Secondary | ICD-10-CM | POA: Diagnosis not present

## 2021-08-12 LAB — IRON AND TIBC
Iron: 27 ug/dL — ABNORMAL LOW (ref 45–182)
Saturation Ratios: 4 % — ABNORMAL LOW (ref 17.9–39.5)
TIBC: 613 ug/dL — ABNORMAL HIGH (ref 250–450)
UIBC: 586 ug/dL

## 2021-08-12 LAB — RETICULOCYTES
Immature Retic Fract: 25.1 % — ABNORMAL HIGH (ref 2.3–15.9)
RBC.: 3.86 MIL/uL — ABNORMAL LOW (ref 4.22–5.81)
Retic Count, Absolute: 88 10*3/uL (ref 19.0–186.0)
Retic Ct Pct: 2.2 % (ref 0.4–3.1)

## 2021-08-12 LAB — FOLATE: Folate: 15.3 ng/mL (ref >5.9)

## 2021-08-12 LAB — LIPID PANEL
Cholesterol: 163 mg/dL (ref 0–200)
HDL: 46 mg/dL (ref >40)
LDL Cholesterol: 108 mg/dL — ABNORMAL HIGH (ref 0–99)
Total CHOL/HDL Ratio: 3.5 RATIO
Triglycerides: 45 mg/dL (ref <150)
VLDL: 9 mg/dL (ref 0–40)

## 2021-08-12 LAB — VITAMIN B12: Vitamin B-12: 258 pg/mL (ref 180–914)

## 2021-08-12 LAB — FERRITIN: Ferritin: 4 ng/mL — ABNORMAL LOW (ref 24–336)

## 2021-08-12 MED ORDER — SODIUM CHLORIDE 0.9 % IV SOLN
250.0000 mg | Freq: Every day | INTRAVENOUS | Status: AC
  Administered 2021-08-12 – 2021-08-13 (×2): 250 mg via INTRAVENOUS
  Filled 2021-08-12 (×2): qty 20

## 2021-08-12 MED ORDER — IPRATROPIUM-ALBUTEROL 0.5-2.5 (3) MG/3ML IN SOLN
3.0000 mL | Freq: Four times a day (QID) | RESPIRATORY_TRACT | Status: DC
  Administered 2021-08-12: 3 mL via RESPIRATORY_TRACT
  Filled 2021-08-12 (×2): qty 3

## 2021-08-12 MED ORDER — ALLOPURINOL 300 MG PO TABS
300.0000 mg | ORAL_TABLET | Freq: Every day | ORAL | Status: DC
  Administered 2021-08-12 – 2021-08-13 (×2): 300 mg via ORAL
  Filled 2021-08-12 (×2): qty 1

## 2021-08-12 MED ORDER — ACETAMINOPHEN 325 MG PO TABS: 650.0000 mg | ORAL_TABLET | Freq: Four times a day (QID) | ORAL | Status: DC | PRN

## 2021-08-12 MED ORDER — LEVOTHYROXINE SODIUM 50 MCG PO TABS
50.0000 ug | ORAL_TABLET | Freq: Every day | ORAL | Status: DC
  Administered 2021-08-12 – 2021-08-13 (×2): 50 ug via ORAL
  Filled 2021-08-12 (×2): qty 1

## 2021-08-12 MED ORDER — HYDROCHLOROTHIAZIDE 12.5 MG PO TABS
12.5000 mg | ORAL_TABLET | Freq: Every day | ORAL | Status: DC
  Administered 2021-08-12 – 2021-08-13 (×2): 12.5 mg via ORAL
  Filled 2021-08-12 (×2): qty 1

## 2021-08-12 MED ORDER — LOSARTAN POTASSIUM 25 MG PO TABS
50.0000 mg | ORAL_TABLET | Freq: Every day | ORAL | Status: DC
  Administered 2021-08-12 – 2021-08-13 (×2): 50 mg via ORAL
  Filled 2021-08-12 (×2): qty 2

## 2021-08-12 MED ORDER — VITAMIN B-12 1000 MCG PO TABS
1000.0000 ug | ORAL_TABLET | Freq: Every day | ORAL | Status: DC
  Administered 2021-08-13: 1000 ug via ORAL
  Filled 2021-08-12: qty 1

## 2021-08-12 MED ORDER — BUSPIRONE HCL 10 MG PO TABS: 15.0000 mg | ORAL_TABLET | Freq: Every day | ORAL | Status: DC

## 2021-08-12 MED ORDER — CYANOCOBALAMIN 1000 MCG/ML IJ SOLN
1000.0000 ug | Freq: Once | INTRAMUSCULAR | Status: AC
  Administered 2021-08-12: 1000 ug via SUBCUTANEOUS
  Filled 2021-08-12 (×2): qty 1

## 2021-08-12 MED ORDER — ALBUTEROL SULFATE (2.5 MG/3ML) 0.083% IN NEBU: 2.5000 mg | INHALATION_SOLUTION | RESPIRATORY_TRACT | Status: DC | PRN

## 2021-08-12 MED ORDER — BUDESONIDE 0.25 MG/2ML IN SUSP
0.2500 mg | Freq: Two times a day (BID) | RESPIRATORY_TRACT | Status: DC
  Administered 2021-08-12 – 2021-08-13 (×3): 0.25 mg via RESPIRATORY_TRACT
  Filled 2021-08-12 (×3): qty 2

## 2021-08-12 MED ORDER — BUSPIRONE HCL 5 MG PO TABS
15.0000 mg | ORAL_TABLET | Freq: Two times a day (BID) | ORAL | Status: DC
  Administered 2021-08-12 – 2021-08-13 (×4): 15 mg via ORAL
  Filled 2021-08-12 (×5): qty 1

## 2021-08-12 MED ORDER — METHYLPREDNISOLONE SODIUM SUCC 125 MG IJ SOLR
120.0000 mg | Freq: Every day | INTRAMUSCULAR | Status: DC
  Administered 2021-08-12 – 2021-08-13 (×2): 120 mg via INTRAVENOUS
  Filled 2021-08-12 (×2): qty 2

## 2021-08-12 MED ORDER — PROPRANOLOL HCL 20 MG PO TABS
20.0000 mg | ORAL_TABLET | Freq: Four times a day (QID) | ORAL | Status: DC
  Administered 2021-08-12 – 2021-08-13 (×6): 20 mg via ORAL
  Filled 2021-08-12 (×6): qty 1

## 2021-08-12 MED ORDER — ACETAMINOPHEN 650 MG RE SUPP: 650.0000 mg | Freq: Four times a day (QID) | RECTAL | Status: DC | PRN

## 2021-08-12 MED ORDER — IPRATROPIUM-ALBUTEROL 0.5-2.5 (3) MG/3ML IN SOLN
3.0000 mL | Freq: Two times a day (BID) | RESPIRATORY_TRACT | Status: DC
  Administered 2021-08-12 – 2021-08-13 (×2): 3 mL via RESPIRATORY_TRACT
  Filled 2021-08-12 (×2): qty 3

## 2021-08-12 MED ORDER — AZITHROMYCIN 250 MG PO TABS
250.0000 mg | ORAL_TABLET | Freq: Every day | ORAL | Status: DC
  Administered 2021-08-13: 250 mg via ORAL
  Filled 2021-08-12: qty 1

## 2021-08-12 MED ORDER — DILTIAZEM HCL ER COATED BEADS 120 MG PO CP24
120.0000 mg | ORAL_CAPSULE | Freq: Every day | ORAL | Status: DC
  Administered 2021-08-12 – 2021-08-13 (×2): 120 mg via ORAL
  Filled 2021-08-12 (×2): qty 1

## 2021-08-12 MED ORDER — AZITHROMYCIN 250 MG PO TABS
500.0000 mg | ORAL_TABLET | Freq: Every day | ORAL | Status: AC
  Administered 2021-08-12: 500 mg via ORAL
  Filled 2021-08-12: qty 2

## 2021-08-12 MED ORDER — DIPHENHYDRAMINE HCL 25 MG PO CAPS
50.0000 mg | ORAL_CAPSULE | Freq: Once | ORAL | Status: AC
  Administered 2021-08-12: 50 mg via ORAL
  Filled 2021-08-12: qty 2

## 2021-08-12 MED ORDER — RAMELTEON 8 MG PO TABS
8.0000 mg | ORAL_TABLET | Freq: Every evening | ORAL | Status: DC | PRN
  Administered 2021-08-12: 8 mg via ORAL
  Filled 2021-08-12 (×2): qty 1

## 2021-08-12 MED ORDER — FERROUS SULFATE 325 (65 FE) MG PO TABS: 325.0000 mg | ORAL_TABLET | Freq: Every day | ORAL | Status: DC

## 2021-08-12 NOTE — Progress Notes (Signed)
Pt refused cpap again tonight.

## 2021-08-12 NOTE — Progress Notes (Signed)
Pt refused his breathing tx and cpap. He said he is ok with his O2 and he would like to rest and do the treatments in the morning. RT told him that we will bring cpap tomorrow night and he said he will be out from the hospital tomorrow. RT will check again.

## 2021-08-12 NOTE — Progress Notes (Signed)
PROGRESS NOTE  Ralph Mack YCX:[id number] DOB: 09-Jul-1966 DOA: 08/11/2021 PCP: [name], PA-C  HPI/Recap of past 24 hours: Ralph Mack is a 55 y.o. male with medical history significant of COPD, OSA on CPAP, paroxysmal atrial flutter, chronic diastolic CHF, CAD, HTN, HLD, hypothyroidism, lymphocytic colitis, prediabetes, morbid obesity (BMI 42.44).  Admitted in April 2022 for COPD exacerbation and discharged on 3 L supplemental oxygen.  Presents to the ED for worsening cough and SOB for the past 3 weeks. Saw his PCP, noted sats in the 60s, advised to go to the ED. In the ED, O2 sats in the 70s on RA on arrival, improved with 4L supplemental oxygen. Labs fairly stable, COVID and influenza PCR negative. EKG without acute ischemic changes.  Chest x-ray showing no active disease. CT angiogram chest negative for PE, pulmonary edema, or pneumonia. Of note, pt was discharged with home O2 during last admission in April, but did not require it longer than a month.    Patient still noted to be SOB, with chest tightness and cough. 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   Assessment/Plan: Principal Problem:   COPD exacerbation (HCC) Active Problems:   Prediabetes   Hypothyroidism   Hyperlipidemia   Acute on chronic respiratory failure (HCC)   Acute hypoxemic respiratory failure secondary to acute COPD exacerbation Oxygen saturation in the 70s on room air on arrival Currently on 2L O2 COVID and influenza PCR negative EKG without acute ischemic changes CXR unremarkable CTA chest negative for PE Continue IV solumedrol, duoneb, pulmicort Continue Azithromycin  Incentive spirometry and flutter valve Continue supplemental oxygen  Chronic diastolic CHF Appears euvolemic at this time Monitor closely  CAD Chest pain free EKG without acute ischemic changes LDL 108, follow up with PCP Continue  ASA   Hypertension Stable Continue Cardizem, propranolol, hydrochlorothiazide  Paroxysmal atrial flutter Currently in sinus rhythm Followed by cardiology and no longer on anticoagulation. Continue Cardizem and propranolol   Iron def anemia Hx of Lymphocytic colitis Hemoglobin 10.1, previously in the 13-14 range No signs of bleeding Anemia panel showed iron 27, TIBC 613, sats 4, ferritin 4, Vit B12-->258 IV iron supplementation X 2 days and oral Vit B12 supplementation   OSA Continue CPAP at night   Hypothyroidism Continue Synthroid   Gout Continue allopurinol  Morbid obesity Lifestyle modification advised    Estimated body mass index is 42.41 kg/m as calculated from the following:   Height as of this encounter: 5' 7.52" (1.715 m).   Weight as of this encounter: 124.7 kg.      Code Status: Full  Family Communication: None at bedside  Disposition Plan: Status is: Inpatient  Remains inpatient appropriate because: Level of care    Consultants: None  Procedures: None  Antimicrobials: Azithromycin  DVT prophylaxis: SCDs   Objective: Vitals:   08/12/21 0445 08/12/21 0731 08/12/21 0841 08/12/21 1235  BP: 116/72  130/75 127/70  Pulse: 64  62 66  Resp: (!) 22  17 16   Temp: 97.7 F (36.5 C)  97.6 F (36.4 C) 98.3 F (36.8 C)  TempSrc: Oral  Oral Oral  SpO2: 96% 97% 91% 92%  Weight:      Height:       No intake or output data in the 24 hours ending 08/12/21 1433 Filed Weights   08/11/21 1616 08/12/21 0012  Weight: 124.7 kg 124.7 kg  Exam: General: NAD  Cardiovascular: S1, S2 present Respiratory: Diminished breath sounds bilaterally Abdomen: Soft, nontender, nondistended, bowel sounds present Musculoskeletal: No bilateral pedal edema noted Skin: Normal Psychiatry: Normal mood     Data Reviewed: CBC: Recent Labs  Lab 08/11/21 1628 08/11/21 1703  WBC 8.0  --   NEUTROABS 5.2  --   HGB 10.1* 11.6*  HCT 34.2* 34.0*  MCV 88.4  --    PLT 229  --    Basic Metabolic Panel: Recent Labs  Lab 08/11/21 1703  NA 136  K 4.5  CL 93*  GLUCOSE 104*  BUN 17  CREATININE 0.90   GFR: Estimated Creatinine Clearance: 115.7 mL/min (by C-G formula based on SCr of 0.9 mg/dL). Liver Function Tests: Recent Labs  Lab 08/11/21 1628  AST 28  ALT 24  ALKPHOS 57  BILITOT 0.6  PROT 6.4*  ALBUMIN 3.8   No results for input(s): LIPASE, AMYLASE in the last 168 hours. No results for input(s): AMMONIA in the last 168 hours. Coagulation Profile: No results for input(s): INR, PROTIME in the last 168 hours. Cardiac Enzymes: No results for input(s): CKTOTAL, CKMB, CKMBINDEX, TROPONINI in the last 168 hours. BNP (last 3 results) No results for input(s): PROBNP in the last 8760 hours. HbA1C: No results for input(s): HGBA1C in the last 72 hours. CBG: No results for input(s): GLUCAP in the last 168 hours. Lipid Profile: Recent Labs    08/12/21 0530  CHOL 163  HDL 46  LDLCALC 108*  TRIG 45  CHOLHDL 3.5   Thyroid Function Tests: No results for input(s): TSH, T4TOTAL, FREET4, T3FREE, THYROIDAB in the last 72 hours. Anemia Panel: Recent Labs    08/12/21 0530  VITAMINB12 258  FOLATE 15.3  FERRITIN 4*  TIBC 613*  IRON 27*  RETICCTPCT 2.2   Urine analysis:    Component Value Date/Time   APPEARANCEUR Clear 06/06/2020 1052   LABSPEC 1.020 05/31/2015 1048   GLUCOSEU Negative 06/06/2020 1052   HGBUR NEGATIVE 05/31/2015 1048   BILIRUBINUR Negative 06/06/2020 1052   KETONESUR negative 05/07/2020 1507   PROTEINUR Negative 06/06/2020 1052   PROTEINUR NEGATIVE 05/31/2015 1048   UROBILINOGEN 0.2 05/07/2020 1507   NITRITE Negative 06/06/2020 1052   LEUKOCYTESUR Negative 06/06/2020 1052   Sepsis Labs: @LABRCNTIP (procalcitonin:4,lacticidven:4)  ) Recent Results (from the past 240 hour(s))  Resp Panel by RT-PCR (Flu A&B, Covid) Nasopharyngeal Swab     Status: None   Collection Time: 08/11/21  4:29 PM   Specimen:  Nasopharyngeal Swab; Nasopharyngeal(NP) swabs in vial transport medium  Result Value Ref Range Status   SARS Coronavirus 2 by RT PCR NEGATIVE NEGATIVE Final    Comment: (NOTE) SARS-CoV-2 target nucleic acids are NOT DETECTED.  The SARS-CoV-2 RNA is generally detectable in upper respiratory specimens during the acute phase of infection. The lowest concentration of SARS-CoV-2 viral copies this assay can detect is 138 copies/mL. A negative result does not preclude SARS-Cov-2 infection and should not be used as the sole basis for treatment or other patient management decisions. A negative result may occur with  improper specimen collection/handling, submission of specimen other than nasopharyngeal swab, presence of viral mutation(s) within the areas targeted by this assay, and inadequate number of viral copies(<138 copies/mL). A negative result must be combined with clinical observations, patient history, and epidemiological information. The expected result is Negative.  Fact Sheet for Patients:  EntrepreneurPulse.com.au  Fact Sheet for Healthcare Providers:  IncredibleEmployment.be  This test is no t yet approved or cleared by the  [place] Architectural techn[employer] and  has been authorized for detection and/or diagnosis of SARS-CoV-2 by FDA under an Print production planner (EUA). This EUA will remain  in effect (meaning this test can be used) for the duration of the COVID-19 declaration under Section 564(b)(1) of the Act, 21 U.S.C.section 360bbb-3(b)(1), unless the authorization is terminated  or revoked sooner.       Influenza A by PCR NEGATIVE NEGATIVE Final   Influenza B by PCR NEGATIVE NEGATIVE Final    Comment: (NOTE) The Xpert Xpress SARS-CoV-2/FLU/RSV plus assay is intended as an aid in the diagnosis of influenza from Nasopharyngeal swab specimens and should not be used as a sole basis for treatment. Nasal washings and aspirates are unacceptable for  Xpert Xpress SARS-CoV-2/FLU/RSV testing.  Fact Sheet for Patients: EntrepreneurPulse.com.au  Fact Sheet for Healthcare Providers: IncredibleEmployment.be  This test is not yet approved or cleared by the [place] FDA and has been authorized for detection and/or diagnosis of SARS-CoV-2 by FDA under an Emergency Use Authorization (EUA). This EUA will remain in effect (meaning this test can be used) for the duration of the COVID-19 declaration under Section 564(b)(1) of the Act, 21 U.S.C. section 360bbb-3(b)(1), unless the authorization is terminated or revoked.  Performed at [facility], [place] [address], [place], [place] [address]       Studies: CT Angio Chest PE W/Cm &/Or Wo Cm  Result Date: 08/11/2021 CLINICAL DATA:  Cough, hypoxia, dyspnea on exertion. PE suspected, high prob. EXAM: CT ANGIOGRAPHY CHEST WITH CONTRAST TECHNIQUE: Multidetector CT imaging of the chest was performed using the standard protocol during bolus administration of intravenous contrast. Multiplanar CT image reconstructions and MIPs were obtained to evaluate the vascular anatomy. CONTRAST:  132mL OMNIPAQUE IOHEXOL 350 MG/ML SOLN COMPARISON:  03/06/2021 FINDINGS: Cardiovascular: There is adequate opacification of the pulmonary arterial tree. There is no intraluminal filling defect identified to suggest acute pulmonary embolism. The central pulmonary arteries are of normal caliber. Moderate coronary artery calcification. Global cardiac size within normal limits. No pericardial effusion. The thoracic aorta is unremarkable. Mediastinum/Nodes: No enlarged mediastinal, hilar, or axillary lymph nodes. Thyroid gland, trachea, and esophagus demonstrate no significant findings. Lungs/Pleura: Lungs are clear. No pneumothorax or pleural effusion. The central airways are widely patent. Upper Abdomen: No acute abnormality. Musculoskeletal: The osseous structures are  age-appropriate. No acute bone abnormality. No lytic or blastic bone lesion. Review of the MIP images confirms the above findings. IMPRESSION: No acute pulmonary embolism. Moderate multi-vessel coronary artery calcification. Electronically Signed   By: Fidela Salisbury M.D.   On: 08/11/2021 19:36   DG Chest Port 1 View  Result Date: 08/11/2021 CLINICAL DATA:  Dyspnea, cough. EXAM: PORTABLE CHEST 1 VIEW COMPARISON:  Chest x-ray 01/06/2021. FINDINGS: The heart size and mediastinal contours are within normal limits. Linear atelectasis is noted in the left lung base. The lungs are otherwise clear. Skeletal structures are unremarkable. IMPRESSION: No active disease. Electronically Signed   By: Ronney Asters M.D.   On: 08/11/2021 17:28    Scheduled Meds:  allopurinol  300 mg Oral Daily   [START ON 08/13/2021] azithromycin  250 mg Oral Daily   budesonide (PULMICORT) nebulizer solution  0.25 mg Nebulization BID   busPIRone  15 mg Oral BID   diltiazem  120 mg Oral Daily   [START ON 08/14/2021] ferrous sulfate  325 mg Oral Q breakfast   hydrochlorothiazide  12.5 mg Oral Daily   ipratropium-albuterol  3 mL Nebulization BID   levothyroxine  50 mcg  Oral Q0600   losartan  50 mg Oral Daily   methylPREDNISolone (SOLU-MEDROL) injection  120 mg Intravenous Daily   propranolol  20 mg Oral QID   [START ON 08/13/2021] vitamin B-12  1,000 mcg Oral Daily    Continuous Infusions:  ferric gluconate (FERRLECIT) IVPB 250 mg (08/12/21 1005)     LOS: 1 day     Alma Friendly, MD [facility]  If 7PM-7AM, please contact night-coverage [email or web address] 08/12/2021, 2:33 PM

## 2021-08-12 NOTE — Plan of Care (Signed)
Pt improving decrease in dyspnea with exertion noted on 3 L/[place]. Only prn given for sleep  Problem: Education: Goal: Knowledge of disease or condition will improve Outcome: Progressing Goal: Knowledge of the prescribed therapeutic regimen will improve Outcome: Progressing Goal: Individualized Educational Video(s) Outcome: Progressing   Problem: Activity: Goal: Ability to tolerate increased activity will improve Outcome: Progressing Goal: Will verbalize the importance of balancing activity with adequate rest periods Outcome: Progressing   Problem: Respiratory: Goal: Ability to maintain a clear airway will improve Outcome: Progressing Goal: Levels of oxygenation will improve Outcome: Progressing Goal: Ability to maintain adequate ventilation will improve Outcome: Progressing   Problem: Education: Goal: Knowledge of General Education information will improve Description: Including pain rating scale, medication(s)/side effects and non-pharmacologic comfort measures Outcome: Progressing   Problem: Health Behavior/Discharge Planning: Goal: Ability to manage health-related needs will improve Outcome: Progressing   Problem: Clinical Measurements: Goal: Ability to maintain clinical measurements within normal limits will improve Outcome: Progressing Goal: Will remain free from infection Outcome: Progressing Goal: Diagnostic test results will improve Outcome: Progressing Goal: Respiratory complications will improve Outcome: Progressing Goal: Cardiovascular complication will be avoided Outcome: Progressing   Problem: Coping: Goal: Level of anxiety will decrease Outcome: Progressing   Problem: Pain Managment: Goal: General experience of comfort will improve Outcome: Progressing   Problem: Safety: Goal: Ability to remain free from injury will improve Outcome: Progressing

## 2021-08-12 NOTE — Progress Notes (Signed)
SATURATION QUALIFICATIONS: (This note is used to comply with regulatory documentation for home oxygen)  Patient Saturations on Room Air at Rest = 88%. Placed pt on 2L of O2.   Patient Saturations on 2 Liters of oxygen while Ambulating = 91%  Please briefly explain why patient needs home oxygen: Pt oxygen saturation desaturates when ambulating in hallway. Pt ambulated 333ft. Pt denied any SOB, dizziness, and lightheadedness.

## 2021-08-13 DIAGNOSIS — J441 Chronic obstructive pulmonary disease with (acute) exacerbation: Secondary | ICD-10-CM | POA: Diagnosis not present

## 2021-08-13 DIAGNOSIS — J9621 Acute and chronic respiratory failure with hypoxia: Secondary | ICD-10-CM | POA: Diagnosis not present

## 2021-08-13 DIAGNOSIS — E039 Hypothyroidism, unspecified: Secondary | ICD-10-CM | POA: Diagnosis not present

## 2021-08-13 LAB — BASIC METABOLIC PANEL
Anion gap: 7 (ref 5–15)
BUN: 19 mg/dL (ref 6–20)
CO2: 34 mmol/L — ABNORMAL HIGH (ref 22–32)
Calcium: 8.9 mg/dL (ref 8.9–10.3)
Chloride: 95 mmol/L — ABNORMAL LOW (ref 98–111)
Creatinine, Ser: 0.73 mg/dL (ref 0.61–1.24)
GFR, Estimated: >60 mL/min (ref >60)
Glucose, Bld: 132 mg/dL — ABNORMAL HIGH (ref 70–99)
Potassium: 4.6 mmol/L (ref 3.5–5.1)
Sodium: 136 mmol/L (ref 135–145)

## 2021-08-13 LAB — CBC
HCT: 32.4 % — ABNORMAL LOW (ref 39.0–52.0)
Hemoglobin: 9.8 g/dL — ABNORMAL LOW (ref 13.0–17.0)
MCH: 26.5 pg (ref 26.0–34.0)
MCHC: 30.2 g/dL (ref 30.0–36.0)
MCV: 87.6 fL (ref 80.0–100.0)
Platelets: 209 10*3/uL (ref 150–400)
RBC: 3.7 MIL/uL — ABNORMAL LOW (ref 4.22–5.81)
RDW: 13.6 % (ref 11.5–15.5)
WBC: 14.8 10*3/uL — ABNORMAL HIGH (ref 4.0–10.5)
nRBC: 0 % (ref 0.0–0.2)

## 2021-08-13 LAB — MAGNESIUM: Magnesium: 2.4 mg/dL (ref 1.7–2.4)

## 2021-08-13 MED ORDER — PROPRANOLOL HCL 20 MG PO TABS
20.0000 mg | ORAL_TABLET | Freq: Three times a day (TID) | ORAL | Status: DC
Start: 2021-08-13 — End: 2022-04-18

## 2021-08-13 MED ORDER — AZITHROMYCIN 500 MG PO TABS: 500.0000 mg | ORAL_TABLET | Freq: Every day | ORAL | 0 refills | Status: DC

## 2021-08-13 MED ORDER — CYANOCOBALAMIN 1000 MCG PO TABS: 1000.0000 ug | ORAL_TABLET | Freq: Every day | ORAL | 0 refills | Status: AC

## 2021-08-13 MED ORDER — PREDNISONE 10 MG PO TABS: ORAL_TABLET | ORAL | 0 refills | Status: AC

## 2021-08-13 MED ORDER — MOMETASONE FURO-FORMOTEROL FUM 200-5 MCG/ACT IN AERO: 2.0000 | INHALATION_SPRAY | Freq: Two times a day (BID) | RESPIRATORY_TRACT | 0 refills | Status: DC

## 2021-08-13 MED ORDER — FERROUS SULFATE 325 (65 FE) MG PO TABS: 325.0000 mg | ORAL_TABLET | Freq: Every day | ORAL | 0 refills | Status: DC

## 2021-08-13 MED ORDER — ACETAMINOPHEN ER 650 MG PO TBCR
1300.0000 mg | EXTENDED_RELEASE_TABLET | Freq: Two times a day (BID) | ORAL | Status: DC | PRN
Start: 2021-08-13 — End: 2021-08-17

## 2021-08-13 NOTE — Discharge Summary (Signed)
Discharge Summary  Ralph Mack FUX:[id number] DOB: 1965-11-26  PCP: [name], PA-C  Admit date: 08/11/2021 Discharge date: 08/13/2021  Time spent: 40 mins  Recommendations for Outpatient Follow-up:  PCP in 1 week Pulmonology as scheduled  Discharge Diagnoses:  Active Hospital Problems   Diagnosis Date Noted   COPD exacerbation ([place]) 08/11/2021   Acute on chronic respiratory failure ([place]) 01/06/2021   Hyperlipidemia 01/06/2019   Hypothyroidism 10/24/2016   Prediabetes 09/05/2015    Resolved Hospital Problems  No resolved problems to display.    Discharge Condition: Stable  Diet recommendation: Heart healthy  Vitals:   08/13/21 0339 08/13/21 0559  BP: 119/80 121/75  Pulse: (!) 54 (!) 58  Resp: 16 18  Temp: (!) 97.4 F (36.3 C) (!) 97.4 F (36.3 C)  SpO2: 91% 94%    History of present illness:  Ralph Mack is a 55 y.o. male with medical history significant of COPD, OSA on CPAP, paroxysmal atrial flutter, chronic diastolic CHF, CAD, HTN, HLD, hypothyroidism, lymphocytic colitis, prediabetes, morbid obesity (BMI 42.44).  Admitted in April 2022 for COPD exacerbation and discharged on 3 L supplemental oxygen.  Presents to the ED for worsening cough and SOB for the past 3 weeks. Saw his PCP, noted sats in the 60s, advised to go to the ED. In the ED, O2 sats in the 70s on RA on arrival, improved with 4L supplemental oxygen. Labs fairly stable, COVID and influenza PCR negative. EKG without acute ischemic changes.  Chest x-ray showing no active disease. CT angiogram chest negative for PE, pulmonary edema, or pneumonia. Of note, pt was discharged with home O2 during last admission in April, but did not require it longer than a month.    Today, pt reported feeling better overall, still hypoxic on ambulation and would require Home O2 in addition to a portable tank. Pt advised to follow up with his PCP and pulmonologist.    Hospital Course:  Principal Problem:   COPD  exacerbation ([place]) Active Problems:   Prediabetes   Hypothyroidism   Hyperlipidemia   Acute on chronic respiratory failure ([facility])   Acute hypoxemic respiratory failure secondary to acute COPD exacerbation Oxygen saturation in the 70s on room air on arrival Currently on 2L O2, requiring it for ambulation, currently saturating well on RA  COVID and influenza PCR negative EKG without acute ischemic changes CXR unremarkable CTA chest negative for PE S/P IV solumedrol- Will d/c on tapered PO prednisone Continue Azithromycin to complete 5 days Started on Dulera, continue prn albuterol Discharge on supplemental O2   Chronic diastolic CHF Appears euvolemic at this time   CAD Chest pain free EKG without acute ischemic changes LDL 108, follow up with PCP to discuss management Continue ASA   Hypertension Stable Continue Cardizem, propranolol (reduced to TID due to HR in the 50s), hydrochlorothiazide   Paroxysmal atrial flutter Currently in sinus rhythm Followed by cardiology and no longer on anticoagulation Continue Cardizem   Iron def anemia Hx of Lymphocytic colitis Hemoglobin 10.1, previously in the 13-14 range No signs of bleeding Anemia panel showed iron 27, TIBC 613, sats 4, ferritin 4, Vit B12-->258 Completed IV iron supplementation X 2 days, continue oral iron and Vit B12 supplementation   OSA Continue CPAP at night   Hypothyroidism Continue Synthroid   Gout Continue allopurinol   Morbid obesity Lifestyle modification advised      Estimated body mass index is 42.41 kg/m as calculated from the following:   Height as of this encounter: 5'  7.52" (1.715 m).   Weight as of this encounter: 124.7 kg.    Procedures: None  Consultations: None  Discharge Exam: BP 121/75 (BP Location: Left Arm)   Pulse (!) 58   Temp (!) 97.4 F (36.3 C) (Oral)   Resp 18   Ht 5' 7.52" (1.715 m)   Wt 124.7 kg   SpO2 94%   BMI 42.41 kg/m   General: NAD   Cardiovascular: S1, S2 present Respiratory: Diminished BS b/l Abdomen: Soft, nontender, nondistended, bowel sounds present Musculoskeletal: No bilateral pedal edema noted Skin: Normal Psychiatry: Normal mood   Discharge Instructions You were cared for by a hospitalist during your hospital stay. If you have any questions about your discharge medications or the care you received while you were in the hospital after you are discharged, you can call the unit and asked to speak with the hospitalist on call if the hospitalist that took care of you is not available. Once you are discharged, your primary care physician will handle any further medical issues. Please note that NO REFILLS for any discharge medications will be authorized once you are discharged, as it is imperative that you return to your primary care physician (or establish a relationship with a primary care physician if you do not have one) for your aftercare needs so that they can reassess your need for medications and monitor your lab values.   Allergies as of 08/13/2021   No Known Allergies      Medication List     STOP taking these medications    acetic acid-hydrocortisone OTIC solution Commonly known as: VOSOL-HC   Potassium 99 MG Tabs   tobramycin 0.3 % ophthalmic solution Commonly known as: Tobrex       TAKE these medications    acetaminophen 650 MG CR tablet Commonly known as: TYLENOL Take 2 tablets (1,300 mg total) by mouth 2 (two) times daily as needed for pain. What changed: how much to take   albuterol 108 (90 Base) MCG/ACT inhaler Commonly known as: ProAir HFA Inhale 2 puffs into the lungs every 4 (four) hours as needed for wheezing or shortness of breath.   allopurinol 300 MG tablet Commonly known as: ZYLOPRIM Take 1 tablet by mouth once daily   aspirin EC 81 MG tablet Take 1 tablet (81 mg total) by mouth daily. Swallow whole.   AYR SALINE NASAL RINSE NA Place 1 Dose into the nose daily as  needed (congestion).   azithromycin 500 MG tablet Commonly known as: Zithromax Take 1 tablet (500 mg total) by mouth daily for 3 days. Take 1 tablet daily for 3 days. Start taking on: August 14, 2724   bismuth subsalicylate 366 MG chewable tablet Commonly known as: PEPTO BISMOL Chew 524 mg by mouth daily as needed for indigestion.   busPIRone 15 MG tablet Commonly known as: BUSPAR Take 1 tablet (15 mg total) by mouth 2 (two) times daily.   cyanocobalamin 1000 MCG tablet Take 1 tablet (1,000 mcg total) by mouth daily. Start taking on: August 14, 2021   dicyclomine 20 MG tablet Commonly known as: BENTYL Take I tablet by mouth 4 times daily before meals and at bedtime as needed What changed:  how much to take how to take this when to take this additional instructions   diltiazem 120 MG 24 hr capsule Commonly known as: CARDIZEM CD Take 1 capsule (120 mg total) by mouth daily.   diphenhydrAMINE HCl (Sleep) 50 MG Caps Take 50 mg by mouth at bedtime  as needed (for sleep).   Euthyrox 50 MCG tablet Generic drug: levothyroxine Take 1 tablet by mouth once daily What changed:  how much to take when to take this   ferrous sulfate 325 (65 FE) MG tablet Take 1 tablet (325 mg total) by mouth daily with breakfast. Start taking on: August 14, 2021   fexofenadine 180 MG tablet Commonly known as: ALLEGRA Take 180 mg by mouth daily as needed for allergies.   fluticasone 50 MCG/ACT nasal spray Commonly known as: FLONASE Place 2 sprays into the nose daily.   hydrochlorothiazide 12.5 MG capsule Commonly known as: MICROZIDE Take 1 capsule (12.5 mg total) by mouth daily. **PLEASE CONTACT OUR OFFICE TO SCHEDULE A FOLLOW UP FOR FUTURE MED REFILLS**   losartan 50 MG tablet Commonly known as: COZAAR Take 1 tablet (50 mg total) by mouth daily. **PLEASE CONTACT OUR OFFICE TO SCHEDULE A FOLLOW UP FOR FUTURE MED REFILLS**   mometasone-formoterol 200-5 MCG/ACT Aero Commonly known  as: DULERA Inhale 2 puffs into the lungs 2 (two) times daily.   Nauzene [id number] MG Chew Generic drug: Dextrose-Fructose-Sod Citrate Chew 1 tablet by mouth daily as needed (nausea).   oxymetazoline 0.05 % nasal spray Commonly known as: AFRIN Place 1 spray into both nostrils 2 (two) times daily as needed for congestion.   predniSONE 10 MG tablet Commonly known as: DELTASONE Take 6 tablets (60 mg total) by mouth daily with breakfast for 2 days, THEN 5 tablets (50 mg total) daily with breakfast for 2 days, THEN 4 tablets (40 mg total) daily with breakfast for 2 days, THEN 3 tablets (30 mg total) daily with breakfast for 2 days, THEN 2 tablets (20 mg total) daily with breakfast for 2 days, THEN 1 tablet (10 mg total) daily with breakfast for 2 days. Start taking on: August 14, 2021   propranolol 20 MG tablet Commonly known as: INDERAL Take 1 tablet (20 mg total) by mouth 3 (three) times daily. What changed: when to take this   tadalafil 10 MG tablet Commonly known as: CIALIS TAKE 1 TABLET BY MOUTH ONCE DAILY AS NEEDED FOR ERECTILE DYSFUNCTION   Vitamin D3 125 MCG (5000 UT) Tabs 5,000 IU OTC vitamin D3 daily. What changed:  how much to take how to take this when to take this additional instructions               Durable Medical Equipment  (From admission, onward)           Start     Ordered   08/13/21 1104  For home use only DME oxygen  Once       Comments: Please also provide POC as pt has COPD, thanks  Question Answer Comment  Length of Need 6 Months   Mode or (Route) Nasal cannula   Liters per Minute 2   Frequency Continuous (stationary and portable oxygen unit needed)   Oxygen delivery system Gas      08/13/21 1104           No Known Allergies  Follow-up Information     [name], PA-C. Schedule an appointment as soon as possible for a visit in 1 week(s).   Specialty: Physician Assistant Contact information: [place] [address] [phone]         Minus Breeding, MD .   Specialty: Cardiology Contact information: [address] [place] [address] [phone]         Vickie Epley,  MD .   Specialties: Cardiology, Radiology Contact information: [place] [address] [phone]         Deneise Lever, MD. Schedule an appointment as soon as possible for a visit.   Specialty: Pulmonary Disease Contact information: [place] [address] [phone]                  The results of significant diagnostics from this hospitalization (including imaging, microbiology, ancillary and laboratory) are listed below for reference.    Significant Diagnostic Studies: CT Angio Chest PE W/Cm &/Or Wo Cm  Result Date: 08/11/2021 CLINICAL DATA:  Cough, hypoxia, dyspnea on exertion. PE suspected, high prob. EXAM: CT ANGIOGRAPHY CHEST WITH CONTRAST TECHNIQUE: Multidetector CT imaging of the chest was performed using the standard protocol during bolus administration of intravenous contrast. Multiplanar CT image reconstructions and MIPs were obtained to evaluate the vascular anatomy. CONTRAST:  153mL OMNIPAQUE IOHEXOL 350 MG/ML SOLN COMPARISON:  03/06/2021 FINDINGS: Cardiovascular: There is adequate opacification of the pulmonary arterial tree. There is no intraluminal filling defect identified to suggest acute pulmonary embolism. The central pulmonary arteries are of normal caliber. Moderate coronary artery calcification. Global cardiac size within normal limits. No pericardial effusion. The thoracic aorta is unremarkable. Mediastinum/Nodes: No enlarged mediastinal, hilar, or axillary lymph nodes. Thyroid gland, trachea, and esophagus demonstrate no significant findings. Lungs/Pleura: Lungs are clear. No pneumothorax or pleural effusion. The central airways are widely patent. Upper Abdomen: No acute abnormality.  Musculoskeletal: The osseous structures are age-appropriate. No acute bone abnormality. No lytic or blastic bone lesion. Review of the MIP images confirms the above findings. IMPRESSION: No acute pulmonary embolism. Moderate multi-vessel coronary artery calcification. Electronically Signed   By: Fidela Salisbury M.D.   On: 08/11/2021 19:36   DG Chest Port 1 View  Result Date: 08/11/2021 CLINICAL DATA:  Dyspnea, cough. EXAM: PORTABLE CHEST 1 VIEW COMPARISON:  Chest x-ray 01/06/2021. FINDINGS: The heart size and mediastinal contours are within normal limits. Linear atelectasis is noted in the left lung base. The lungs are otherwise clear. Skeletal structures are unremarkable. IMPRESSION: No active disease. Electronically Signed   By: Ronney Asters M.D.   On: 08/11/2021 17:28   CUP PACEART REMOTE DEVICE CHECK  Result Date: 07/23/2021 ILR summary report received. Battery status OK. Normal device function. No new symptom, tachy, brady, or pause episodes. There were 29 AF episodes, EGMs show SR with ectopy, some were previously viewed and reviewed.  AF burden is 2.5%, is on [facility] according to previous reports.  Monthly summary reports and ROV/PRN Kathy Breach, RN, CCDS, [employer]   Microbiology: Recent Results (from the past 240 hour(s))  Resp Panel by RT-PCR (Flu A&B, Covid) Nasopharyngeal Swab     Status: None   Collection Time: 08/11/21  4:29 PM   Specimen: Nasopharyngeal Swab; Nasopharyngeal(NP) swabs in vial transport medium  Result Value Ref Range Status   SARS Coronavirus 2 by RT PCR NEGATIVE NEGATIVE Final    Comment: (NOTE) SARS-CoV-2 target nucleic acids are NOT DETECTED.  The SARS-CoV-2 RNA is generally detectable in upper respiratory specimens during the acute phase of infection. The lowest concentration of SARS-CoV-2 viral copies this assay can detect is 138 copies/mL. A negative result does not preclude SARS-Cov-2 infection and should not be used as the sole basis for  treatment or other patient management decisions. A negative result may occur with  improper specimen collection/handling, submission of specimen other than nasopharyngeal  swab, presence of viral mutation(s) within the areas targeted by this assay, and inadequate number of viral copies(<138 copies/mL). A negative result must be combined with clinical observations, patient history, and epidemiological information. The expected result is Negative.  Fact Sheet for Patients:  EntrepreneurPulse.com.au  Fact Sheet for Healthcare Providers:  IncredibleEmployment.be  This test is no t yet approved or cleared by the [place] FDA and  has been authorized for detection and/or diagnosis of SARS-CoV-2 by FDA under an Emergency Use Authorization (EUA). This EUA will remain  in effect (meaning this test can be used) for the duration of the COVID-19 declaration under Section 564(b)(1) of the Act, 21 U.S.C.section 360bbb-3(b)(1), unless the authorization is terminated  or revoked sooner.       Influenza A by PCR NEGATIVE NEGATIVE Final   Influenza B by PCR NEGATIVE NEGATIVE Final    Comment: (NOTE) The Xpert Xpress SARS-CoV-2/FLU/RSV plus assay is intended as an aid in the diagnosis of influenza from Nasopharyngeal swab specimens and should not be used as a sole basis for treatment. Nasal washings and aspirates are unacceptable for Xpert Xpress SARS-CoV-2/FLU/RSV testing.  Fact Sheet for Patients: EntrepreneurPulse.com.au  Fact Sheet for Healthcare Providers: IncredibleEmployment.be  This test is not yet approved or cleared by the [place] FDA and has been authorized for detection and/or diagnosis of SARS-CoV-2 by FDA under an Emergency Use Authorization (EUA). This EUA will remain in effect (meaning this test can be used) for the duration of the COVID-19 declaration under Section 564(b)(1) of the Act, 21  U.S.C. section 360bbb-3(b)(1), unless the authorization is terminated or revoked.  Performed at [facility], [place] [address] [place], [place] [id number]      Labs: Basic Metabolic Panel: Recent Labs  Lab 08/11/21 1703 08/13/21 0504  NA 136 136  K 4.5 4.6  CL 93* 95*  CO2  --  34*  GLUCOSE 104* 132*  BUN 17 19  CREATININE 0.90 0.73  CALCIUM  --  8.9  MG  --  2.4   Liver Function Tests: Recent Labs  Lab 08/11/21 1628  AST 28  ALT 24  ALKPHOS 57  BILITOT 0.6  PROT 6.4*  ALBUMIN 3.8   No results for input(s): LIPASE, AMYLASE in the last 168 hours. No results for input(s): AMMONIA in the last 168 hours. CBC: Recent Labs  Lab 08/11/21 1628 08/11/21 1703 08/13/21 0504  WBC 8.0  --  14.8*  NEUTROABS 5.2  --   --   HGB 10.1* 11.6* 9.8*  HCT 34.2* 34.0* 32.4*  MCV 88.4  --  87.6  PLT 229  --  209   Cardiac Enzymes: No results for input(s): CKTOTAL, CKMB, CKMBINDEX, TROPONINI in the last 168 hours. BNP: BNP (last 3 results) Recent Labs    01/06/21 0932  BNP 72.3    ProBNP (last 3 results) No results for input(s): PROBNP in the last 8760 hours.  CBG: No results for input(s): GLUCAP in the last 168 hours.     Signed:  Alma Friendly, MD [facility] 08/13/2021, 11:19 AM

## 2021-08-13 NOTE — TOC Transition Note (Signed)
Transition of Care [facility]) - CM/SW Discharge Note   Patient Details  Name: Ralph Mack MRN: [id number] Date of Birth: 1966/06/26  Transition of [facility]) CM/SW Contact:  Trish Mage, LCSW Phone Number: 08/13/2021, 10:37 AM   Clinical Narrative:  Patient who is stable for d/c today is in need of home O2, requesting POC.  Orders and SAT note seen and appreciated. Contacted Caryl Pina with [employer] who will  arrange delivery of travel cannister, home unit. TOC will continue to follow during the course of hospitalization.     Final next level of care: Home/Self Care Barriers to Discharge: No Barriers Identified   Patient Goals and CMS Choice        Discharge Placement                       Discharge Plan and Services                                     Social Determinants of Health (SDOH) Interventions     Readmission Risk Interventions No flowsheet data found.

## 2021-08-13 NOTE — Progress Notes (Signed)
At 0553 notified by telemetry taht patient had a 3 beat run of V tach, checked on patient and assessed. Patient was resting comfortably in bed not in distress, vital signs taken. Notified Dr. Jaquita Rector notified, magnesium added on previously collected blood.

## 2021-08-13 NOTE — Progress Notes (Signed)
SATURATION QUALIFICATIONS: (This note is used to comply with regulatory documentation for home oxygen)  Patient Saturations on Room Air at Rest = 95%  Patient Saturations on Room Air while Ambulating = 87%  Patient Saturations on 2 Liters of oxygen while Ambulating = 92%  Please briefly explain why patient needs home oxygen: Patients oxygen saturations drop to 87% while ambulating on room air.

## 2021-08-16 ENCOUNTER — Ambulatory Visit: Payer: BC Managed Care – PPO | Admitting: Urology

## 2021-08-17 ENCOUNTER — Other Ambulatory Visit: Payer: Self-pay

## 2021-08-17 ENCOUNTER — Encounter: Payer: Self-pay | Admitting: Adult Health

## 2021-08-17 ENCOUNTER — Ambulatory Visit (INDEPENDENT_AMBULATORY_CARE_PROVIDER_SITE_OTHER): Payer: BC Managed Care – PPO | Admitting: Adult Health

## 2021-08-17 ENCOUNTER — Encounter: Payer: Self-pay | Admitting: Cardiology

## 2021-08-17 VITALS — BP 100/70 | HR 73 | Temp 98.2°F | Ht 67.5 in | Wt 275.0 lb

## 2021-08-17 DIAGNOSIS — R918 Other nonspecific abnormal finding of lung field: Secondary | ICD-10-CM

## 2021-08-17 DIAGNOSIS — J441 Chronic obstructive pulmonary disease with (acute) exacerbation: Secondary | ICD-10-CM

## 2021-08-17 DIAGNOSIS — J9611 Chronic respiratory failure with hypoxia: Secondary | ICD-10-CM

## 2021-08-17 DIAGNOSIS — G4733 Obstructive sleep apnea (adult) (pediatric): Secondary | ICD-10-CM | POA: Diagnosis not present

## 2021-08-17 MED ORDER — SPIRIVA RESPIMAT 2.5 MCG/ACT IN AERS: 2.0000 | INHALATION_SPRAY | Freq: Every day | RESPIRATORY_TRACT | 5 refills | Status: DC

## 2021-08-17 NOTE — Assessment & Plan Note (Signed)
Recent hospitalization with COPD exacerbation and new onset hypoxemia. Today in the office patient without significant desaturations.  We will check an overnight oximetry test on CPAP to evaluate for nocturnal hypoxemia Continue on oxygen 2 L with activity maintain O2 saturations greater than 88 to 90%   Plan  Patient Instructions  Finish Prednisone taper as directed.  Continue on Dulera 2 puffs Twice daily , rinse after use.  Add Spiriva 2 puffs daily  Albuterol inhaler As needed   CPAP download  Continue on CPAP At bedtime   Work on healthy weight loss.  Do not drive if sleepy  Avoid sedating medications as able.  Refer to LDCT screening program  Alpha 1 testing Follow up with Dr. Chase Caller in 3-4 weeks and As needed   Please contact office for sooner follow up if symptoms do not improve or worsen or seek emergency care

## 2021-08-17 NOTE — Patient Instructions (Addendum)
Finish Prednisone taper as directed.  Continue on Dulera 2 puffs Twice daily , rinse after use.  Add Spiriva 2 puffs daily  Albuterol inhaler As needed   CPAP download  Continue on CPAP At bedtime   Work on healthy weight loss.  Do not drive if sleepy  Avoid sedating medications as able.  Refer to LDCT screening program  Alpha 1 testing Follow up with Dr. Chase Caller in 3-4 weeks and As needed   Please contact office for sooner follow up if symptoms do not improve or worsen or seek emergency care

## 2021-08-17 NOTE — Assessment & Plan Note (Signed)
Encouraged on healthy weight loss 

## 2021-08-17 NOTE — Assessment & Plan Note (Signed)
Acute COPD exacerbation.  Patient has moderate COPD.  He has had 2 exacerbations requiring hospital stays this past year.  We will add and Spiriva to [facility].  For triple therapy maintenance. We will have her finish up prednisone. Refer to low-dose CT screening program will not need a CT for 1 year Alpha-1 antitrypsin testing  Plan  Patient Instructions  Finish Prednisone taper as directed.  Continue on Dulera 2 puffs Twice daily , rinse after use.  Add Spiriva 2 puffs daily  Albuterol inhaler As needed   CPAP download  Continue on CPAP At bedtime   Work on healthy weight loss.  Do not drive if sleepy  Avoid sedating medications as able.  Refer to LDCT screening program  Alpha 1 testing Follow up with Dr. Chase Caller in 3-4 weeks and As needed   Please contact office for sooner follow up if symptoms do not improve or worsen or seek emergency care

## 2021-08-17 NOTE — Progress Notes (Signed)
@Patient  ID: Ralph Mack, male    DOB: [birth year], 55 y.o.   MRN: [id number]  Chief Complaint  Patient presents with   Follow-up    Referring provider: [name], PA-C  HPI: 55 year old male former smoker (quit 12/2020) followed for mild COPD and obstructive sleep apnea Medial history Atrial Flutter, Lymphocytic Colitis , Diastolic Dysfunction  Truck Driver for [employer]   TEST/EVENTS :  HST- 03/28/21- AHI 60/ hr, desaturation 49-82%, body weight 252 lbs PFTs March 08, 2021 FEV1 66%, ratio 70, FVC 72%, no significant bronchodilator response, DLCO 65%  CT chest 08/11/21 -Lungs clear , negative PE  08/17/2021 Follow up : COPD and OSA  Post hospital follow up  Patient returns for follow up.  Patient has underlying moderate COPD.  Patient was recently hospitalized for a COPD exacerbation with acute respiratory failure with hypoxemia.  He was treated with empiric antibiotics and steroids.  Discharged on a prednisone taper.  Patient was started on oxygen at discharge at 2 L with activity.  He was started on Dulera at discharge.  CT chest was negative for PE.  Showed clear lungs.  Patient says since discharge he is feeling some better but continues to get short of breath with activities.  He did obtain a pulse oximeter.  And notices that his oxygen levels do drop.  Seems to be worse at nighttime.  Today in the office walk test with a forehead probe showed no significant desaturations with O2 saturations maintaining around 89 to 93% on room air.  Patient says he has not been wearing his oxygen very much since discharge.  Patient has underlying obstructive sleep apnea.  Is on CPAP at bedtime.  Patient says he is trying to wear his CPAP every single night.  Usually gets in about 6 hours.  Patient says at times it does not feel that his CPAP is strong enough.  He also notices that his oxygen at times is low whenever he checks it at nighttime.  CPAP download shows excellent compliance with daily  average usage at 6 hours.  Patient is on auto CPAP 5 to 20 cm H2O.  Daily average pressure at 6 cm of H2O.  AHI 3.7/hour.  Patient does go through safe sleep [employer] through [employer].  Patient has quit smoking.  He smoked about a pack a day for the last 40 years.  We discussed low-dose CT screening program.  Patient is agreeable.Declines flu and covid vaccine .    No Known Allergies  Immunization History  Administered Date(s) Administered   Influenza,inj,Quad PF,6+ Mos 07/26/2015    Past Medical History:  Diagnosis Date   Allergy    Arthritis    Hx of colonic polyp 11/13/2016   10/2016 7 mm ssp/a - recall 2023   Hypertension    Hypothyroidism    Lymphocytic colitis 04/19/2019   Mild CAD 2009   Obesity    OSA (obstructive sleep apnea) 04/20/2021   Paroxysmal atrial flutter (HCC)    Prediabetes    Recurrent upper respiratory infection (URI)     Tobacco History: Social History   Tobacco Use  Smoking Status Former   Packs/day: 0.50   Years: 37.00   Pack years: 18.50   Types: Cigarettes   Quit date: 01/10/2021   Years since quitting: 0.6  Smokeless Tobacco Never  Tobacco Comments   not smoking currently-[place] 02/20/21   Counseling given: Not Answered Tobacco comments: not smoking currently-[place] 02/20/21   Outpatient Medications Prior to Visit  Medication Sig  Dispense Refill   albuterol (PROAIR HFA) 108 (90 Base) MCG/ACT inhaler Inhale 2 puffs into the lungs every 4 (four) hours as needed for wheezing or shortness of breath. 18 g 3   allopurinol (ZYLOPRIM) 300 MG tablet Take 1 tablet by mouth once daily (Patient taking differently: Take 300 mg by mouth daily.) 90 tablet 0   aspirin EC 81 MG tablet Take 1 tablet (81 mg total) by mouth daily. Swallow whole. 90 tablet 3   bismuth subsalicylate (PEPTO BISMOL) 262 MG chewable tablet Chew 524 mg by mouth daily as needed for indigestion.     busPIRone (BUSPAR) 15 MG tablet Take 1 tablet (15 mg total) by mouth 2 (two) times  daily. 180 tablet 1   Cholecalciferol (VITAMIN D3) 5000 units TABS 5,000 IU OTC vitamin D3 daily. (Patient taking differently: Take 5,000 Units by mouth daily.) 90 tablet 3   Dextrose-Fructose-Sod Citrate (NAUZENE) [phone] MG CHEW Chew 1 tablet by mouth daily as needed (nausea).     dicyclomine (BENTYL) 20 MG tablet Take I tablet by mouth 4 times daily before meals and at bedtime as needed (Patient taking differently: Take 20 mg by mouth See admin instructions. Take I tablet by mouth 2 to 3 times daily before meals and at bedtime as needed for irritable bowel syndrome) 360 tablet 0   diltiazem (CARDIZEM CD) 120 MG 24 hr capsule Take 1 capsule (120 mg total) by mouth daily. 90 capsule 3   diphenhydrAMINE HCl, Sleep, 50 MG CAPS Take 50 mg by mouth at bedtime as needed (for sleep).     EUTHYROX 50 MCG tablet Take 1 tablet by mouth once daily (Patient taking differently: Take 50 mcg by mouth daily before breakfast.) 90 tablet 0   ferrous sulfate 325 (65 FE) MG tablet Take 1 tablet (325 mg total) by mouth daily with breakfast. 30 tablet 0   fexofenadine (ALLEGRA) 180 MG tablet Take 180 mg by mouth daily as needed for allergies.     fluticasone (FLONASE) 50 MCG/ACT nasal spray Place 2 sprays into the nose daily.     hydrochlorothiazide (MICROZIDE) 12.5 MG capsule Take 1 capsule (12.5 mg total) by mouth daily. **PLEASE CONTACT OUR OFFICE TO SCHEDULE A FOLLOW UP FOR FUTURE MED REFILLS** 90 capsule 0   losartan (COZAAR) 50 MG tablet Take 1 tablet (50 mg total) by mouth daily. **PLEASE CONTACT OUR OFFICE TO SCHEDULE A FOLLOW UP FOR FUTURE MED REFILLS** 90 tablet 0   mometasone-formoterol (DULERA) 200-5 MCG/ACT AERO Inhale 2 puffs into the lungs 2 (two) times daily. 1 each 0   predniSONE (DELTASONE) 10 MG tablet Take 6 tablets (60 mg total) by mouth daily with breakfast for 2 days, THEN 5 tablets (50 mg total) daily with breakfast for 2 days, THEN 4 tablets (40 mg total) daily with breakfast for 2 days, THEN 3  tablets (30 mg total) daily with breakfast for 2 days, THEN 2 tablets (20 mg total) daily with breakfast for 2 days, THEN 1 tablet (10 mg total) daily with breakfast for 2 days. 42 tablet 0   propranolol (INDERAL) 20 MG tablet Take 1 tablet (20 mg total) by mouth 3 (three) times daily.     Sodium Chloride-Sodium Bicarb (AYR SALINE NASAL RINSE NA) Place 1 Dose into the nose daily as needed (congestion).     tadalafil (CIALIS) 10 MG tablet TAKE 1 TABLET BY MOUTH ONCE DAILY AS NEEDED FOR ERECTILE DYSFUNCTION (Patient taking differently: Take 10 mg by mouth daily as needed for erectile  dysfunction.) 10 tablet 0   vitamin B-12 1000 MCG tablet Take 1 tablet (1,000 mcg total) by mouth daily. 30 tablet 0   oxymetazoline (AFRIN) 0.05 % nasal spray Place 1 spray into both nostrils 2 (two) times daily as needed for congestion. (Patient not taking: Reported on 08/17/2021)     acetaminophen (TYLENOL) 650 MG CR tablet Take 2 tablets (1,300 mg total) by mouth 2 (two) times daily as needed for pain. (Patient not taking: Reported on 08/17/2021)     azithromycin (ZITHROMAX) 500 MG tablet Take 1 tablet (500 mg total) by mouth daily for 3 days. Take 1 tablet daily for 3 days. (Patient not taking: Reported on 08/17/2021) 3 tablet 0   No facility-administered medications prior to visit.     Review of Systems:   Constitutional:   No  weight loss, night sweats,  Fevers, chills,  +fatigue, or  lassitude.  HEENT:   No headaches,  Difficulty swallowing,  Tooth/dental problems, or  Sore throat,                No sneezing, itching, ear ache, nasal congestion, post nasal drip,   CV:  No chest pain,  Orthopnea, PND, swelling in lower extremities, anasarca, dizziness, palpitations, syncope.   GI  No heartburn, indigestion, abdominal pain, nausea, vomiting, diarrhea, change in bowel habits, loss of appetite, bloody stools.   Resp:   No chest wall deformity  Skin: no rash or lesions.  GU: no dysuria, change in color of  urine, no urgency or frequency.  No flank pain, no hematuria   MS:  No joint pain or swelling.  No decreased range of motion.  No back pain.    Physical Exam  BP 100/70 (BP Location: Left Arm, Patient Position: Sitting, Cuff Size: Large)   Pulse 73   Temp 98.2 F (36.8 C) (Oral)   Ht 5' 7.5" (1.715 m)   Wt 275 lb (124.7 kg)   SpO2 95%   BMI 42.44 kg/m   GEN: A/Ox3; pleasant , NAD, well nourished, BMI 42   HEENT:  [place]/AT,  NOSE-clear, THROAT-clear, no lesions, no postnasal drip or exudate noted.  Class III MP airway  NECK:  Supple w/ fair ROM; no JVD; normal carotid impulses w/o bruits; no thyromegaly or nodules palpated; no lymphadenopathy.    RESP  Clear  P & A; w/o, wheezes/ rales/ or rhonchi. no accessory muscle use, no dullness to percussion  CARD:  RRR, no m/r/g, tr  peripheral edema, pulses intact, no cyanosis or clubbing.  GI:   Soft & nt; nml bowel sounds; no organomegaly or masses detected.   Musco: Warm bil, no deformities or joint swelling noted.   Neuro: alert, no focal deficits noted.    Skin: Warm, no lesions or rashes    Lab Results:   BMET   BNP    Component Value Date/Time   BNP 72.3 01/06/2021 0932    ProBNP No results found for: PROBNP  Imaging: CT Angio Chest PE W/Cm &/Or Wo Cm  Result Date: 08/11/2021 CLINICAL DATA:  Cough, hypoxia, dyspnea on exertion. PE suspected, high prob. EXAM: CT ANGIOGRAPHY CHEST WITH CONTRAST TECHNIQUE: Multidetector CT imaging of the chest was performed using the standard protocol during bolus administration of intravenous contrast. Multiplanar CT image reconstructions and MIPs were obtained to evaluate the vascular anatomy. CONTRAST:  129mL OMNIPAQUE IOHEXOL 350 MG/ML SOLN COMPARISON:  03/06/2021 FINDINGS: Cardiovascular: There is adequate opacification of the pulmonary arterial tree. There is no intraluminal filling defect  identified to suggest acute pulmonary embolism. The central pulmonary arteries are of  normal caliber. Moderate coronary artery calcification. Global cardiac size within normal limits. No pericardial effusion. The thoracic aorta is unremarkable. Mediastinum/Nodes: No enlarged mediastinal, hilar, or axillary lymph nodes. Thyroid gland, trachea, and esophagus demonstrate no significant findings. Lungs/Pleura: Lungs are clear. No pneumothorax or pleural effusion. The central airways are widely patent. Upper Abdomen: No acute abnormality. Musculoskeletal: The osseous structures are age-appropriate. No acute bone abnormality. No lytic or blastic bone lesion. Review of the MIP images confirms the above findings. IMPRESSION: No acute pulmonary embolism. Moderate multi-vessel coronary artery calcification. Electronically Signed   By: Fidela Salisbury M.D.   On: 08/11/2021 19:36   DG Chest Port 1 View  Result Date: 08/11/2021 CLINICAL DATA:  Dyspnea, cough. EXAM: PORTABLE CHEST 1 VIEW COMPARISON:  Chest x-ray 01/06/2021. FINDINGS: The heart size and mediastinal contours are within normal limits. Linear atelectasis is noted in the left lung base. The lungs are otherwise clear. Skeletal structures are unremarkable. IMPRESSION: No active disease. Electronically Signed   By: Ronney Asters M.D.   On: 08/11/2021 17:28   CUP PACEART REMOTE DEVICE CHECK  Result Date: 07/23/2021 ILR summary report received. Battery status OK. Normal device function. No new symptom, tachy, brady, or pause episodes. There were 29 AF episodes, EGMs show SR with ectopy, some were previously viewed and reviewed.  AF burden is 2.5%, is on [facility] according to previous reports.  Monthly summary reports and ROV/PRN Kathy Breach, RN, CCDS, [employer]     PFT Results Latest Ref Rng & Units 03/08/2021  FVC-Pre L 3.04  FVC-Predicted Pre % 69  FVC-Post L 3.15  FVC-Predicted Post % 72  Pre FEV1/FVC % % 71  Post FEV1/FCV % % 70  FEV1-Pre L 2.16  FEV1-Predicted Pre % 64  FEV1-Post L 2.22  DLCO uncorrected ml/min/mmHg 16.68   DLCO UNC% % 65  DLCO corrected ml/min/mmHg 16.68  DLCO COR %Predicted % 65  DLVA Predicted % 97  TLC L 5.65  TLC % Predicted % 88  RV % Predicted % 88    No results found for: NITRICOXIDE      Assessment & Plan:   COPD exacerbation (HCC) Acute COPD exacerbation.  Patient has moderate COPD.  He has had 2 exacerbations requiring hospital stays this past year.  We will add and Spiriva to [facility].  For triple therapy maintenance. We will have her finish up prednisone. Refer to low-dose CT screening program will not need a CT for 1 year Alpha-1 antitrypsin testing  Plan  Patient Instructions  Finish Prednisone taper as directed.  Continue on Dulera 2 puffs Twice daily , rinse after use.  Add Spiriva 2 puffs daily  Albuterol inhaler As needed   CPAP download  Continue on CPAP At bedtime   Work on healthy weight loss.  Do not drive if sleepy  Avoid sedating medications as able.  Refer to LDCT screening program  Alpha 1 testing Follow up with Dr. Chase Caller in 3-4 weeks and As needed   Please contact office for sooner follow up if symptoms do not improve or worsen or seek emergency care               Morbid (severe) obesity due to excess calories ([place]) Encouraged on healthy weight loss  OSA (obstructive sleep apnea) Severe obstructive sleep apnea-patient has excellent control and compliance on nocturnal CPAP.  He does complain of shortness of breath and intermittent hypoxemia nocturnally.  We will check an overnight oximetry test.  If comes back showing desaturations on CPAP.  May need to begin oxygen with his CPAP and/or set up for a CPAP titration study.  Plan  Patient Instructions  Finish Prednisone taper as directed.  Continue on Dulera 2 puffs Twice daily , rinse after use.  Add Spiriva 2 puffs daily  Albuterol inhaler As needed   CPAP download  Continue on CPAP At bedtime   Work on healthy weight loss.  Do not drive if sleepy  Avoid sedating medications  as able.  Refer to LDCT screening program  Alpha 1 testing Follow up with Dr. Chase Caller in 3-4 weeks and As needed   Please contact office for sooner follow up if symptoms do not improve or worsen or seek emergency care               Chronic respiratory failure with hypoxia [facility]) Recent hospitalization with COPD exacerbation and new onset hypoxemia. Today in the office patient without significant desaturations.  We will check an overnight oximetry test on CPAP to evaluate for nocturnal hypoxemia Continue on oxygen 2 L with activity maintain O2 saturations greater than 88 to 90%   Plan  Patient Instructions  Finish Prednisone taper as directed.  Continue on Dulera 2 puffs Twice daily , rinse after use.  Add Spiriva 2 puffs daily  Albuterol inhaler As needed   CPAP download  Continue on CPAP At bedtime   Work on healthy weight loss.  Do not drive if sleepy  Avoid sedating medications as able.  Refer to LDCT screening program  Alpha 1 testing Follow up with Dr. Chase Caller in 3-4 weeks and As needed   Please contact office for sooner follow up if symptoms do not improve or worsen or seek emergency care               I spent  43  minutes dedicated to the care of this patient on the date of this encounter to include pre-visit review of records, face-to-face time with the patient discussing conditions above, post visit ordering of testing, clinical documentation with the electronic health record, making appropriate referrals as documented, and communicating necessary findings to members of the patients care team.    Rexene Edison, NP 08/17/2021

## 2021-08-17 NOTE — Assessment & Plan Note (Signed)
Severe obstructive sleep apnea-patient has excellent control and compliance on nocturnal CPAP.  He does complain of shortness of breath and intermittent hypoxemia nocturnally.  We will check an overnight oximetry test.  If comes back showing desaturations on CPAP.  May need to begin oxygen with his CPAP and/or set up for a CPAP titration study.  Plan  Patient Instructions  Finish Prednisone taper as directed.  Continue on Dulera 2 puffs Twice daily , rinse after use.  Add Spiriva 2 puffs daily  Albuterol inhaler As needed   CPAP download  Continue on CPAP At bedtime   Work on healthy weight loss.  Do not drive if sleepy  Avoid sedating medications as able.  Refer to LDCT screening program  Alpha 1 testing Follow up with Dr. Chase Caller in 3-4 weeks and As needed   Please contact office for sooner follow up if symptoms do not improve or worsen or seek emergency care

## 2021-08-21 ENCOUNTER — Telehealth: Payer: Self-pay | Admitting: Primary Care

## 2021-08-21 NOTE — Telephone Encounter (Signed)
ATC [facility] at [phone] to let them know that he is going to discuss this with MR at his upcoming appt but they are closed. Also called to let the patient make him aware as well.

## 2021-08-21 NOTE — Telephone Encounter (Signed)
There were no pulmonary nodules noted but CTA imaging is not always the preferred imaging. Recommend he discuss with MR at his apt on Dec 9th.

## 2021-08-21 NOTE — Telephone Encounter (Signed)
BW put in order on 03/08/21 for pt to have chest CT in Dec.  Lisa at [facility] called pt to schedule and he states he had CT angio in the hospital on 11/12 & asked if he still needs to get CT in Dec.  Please advise.

## 2021-08-21 NOTE — Telephone Encounter (Signed)
EW please advise. Does patient still need CT? Thanks :)

## 2021-08-22 NOTE — Telephone Encounter (Signed)
Called and spoke with pt letting him know the info stated by Eustaquio Maize that at upcoming appt 12/9 with MR he could discuss timing for repeat CT and pt verbalized understanding. Called [facility] and spoke with Pam letting her know that info as well and she verbalized understanding. Nothing further needed.

## 2021-08-27 ENCOUNTER — Telehealth: Payer: Self-pay | Admitting: *Deleted

## 2021-08-27 ENCOUNTER — Encounter: Payer: Self-pay | Admitting: Internal Medicine

## 2021-08-27 ENCOUNTER — Ambulatory Visit (INDEPENDENT_AMBULATORY_CARE_PROVIDER_SITE_OTHER): Payer: BC Managed Care – PPO

## 2021-08-27 DIAGNOSIS — I483 Typical atrial flutter: Secondary | ICD-10-CM | POA: Diagnosis not present

## 2021-08-27 LAB — ALPHA-1 ANTITRYPSIN PHENOTYPE: A-1 Antitrypsin, Ser: 137 mg/dL (ref 83–199)

## 2021-08-27 NOTE — Telephone Encounter (Signed)
Transition Care Management Follow-up Telephone Call Date of discharge and from where: 08/13/21  [facility] How have you been since you were released from the hospital? "Doing fine, back to work" Any questions or concerns? No  Items Reviewed: Did the pt receive and understand the discharge instructions provided? Yes  Medications obtained and verified? Yes  Other? No  Any new allergies since your discharge? No  Dietary orders reviewed? Yes Do you have support at home? Yes   Home Care and Equipment/Supplies: Were home health services ordered? no If so, what is the name of the agency? N/a  Has the agency set up a time to come to the patient's home? not applicable Were any new equipment or medical supplies ordered?  No What is the name of the medical supply agency? N/a Were you able to get the supplies/equipment? not applicable Do you have any questions related to the use of the equipment or supplies? N/a  Functional Questionnaire: (I = Independent and D = Dependent) ADLs: I  Bathing/Dressing- I  Meal Prep- I  Eating- I  Maintaining continence- I  Transferring/Ambulation- I  Managing Meds- I  Follow up appointments reviewed:  PCP Hospital f/u appt confirmed? No Specialist Hospital f/u appt confirmed? Yes  Saw Tammy Parrett NP on 08/17/21 Are transportation arrangements needed? No  If their condition worsens, is the pt aware to call PCP or go to the Emergency Dept.? Yes Was the patient provided with contact information for the PCP's office or ED? Yes Was to pt encouraged to call back with questions or concerns? Yes  Jacqlyn Larsen [facility], BSN RN Case Manager [phone]

## 2021-08-28 LAB — CUP PACEART REMOTE DEVICE CHECK
Date Time Interrogation Session: 20221129100529
Implantable Pulse Generator Implant Date: 20220809

## 2021-08-28 NOTE — Telephone Encounter (Signed)
Pt was wanting to change his appointment. Pt was notified that Dr. Carlean Purl had no open appointment  until January. Pt states that he will try and see if he can get the day off for his appointment that is already scheduled.  Pt to send another my chart message to clarify if he can make appointment

## 2021-09-04 NOTE — Progress Notes (Signed)
Carelink Summary Report / Loop Recorder 

## 2021-09-05 ENCOUNTER — Ambulatory Visit (INDEPENDENT_AMBULATORY_CARE_PROVIDER_SITE_OTHER): Payer: BC Managed Care – PPO | Admitting: Internal Medicine

## 2021-09-05 ENCOUNTER — Encounter: Payer: Self-pay | Admitting: Internal Medicine

## 2021-09-05 VITALS — BP 112/72 | HR 62 | Ht 67.5 in | Wt 278.0 lb

## 2021-09-05 DIAGNOSIS — K52832 Lymphocytic colitis: Secondary | ICD-10-CM

## 2021-09-05 DIAGNOSIS — K580 Irritable bowel syndrome with diarrhea: Secondary | ICD-10-CM

## 2021-09-05 MED ORDER — DICYCLOMINE HCL 20 MG PO TABS: ORAL_TABLET | ORAL | 3 refills | Status: DC

## 2021-09-05 NOTE — Patient Instructions (Addendum)
We have sent the following medications to your pharmacy for you to pick up at your convenience: Generic Bentyl  If you are age 57 or older, your body mass index should be between 23-30. Your Body mass index is 42.9 kg/m. If this is out of the aforementioned range listed, please consider follow up with your Primary Care Provider.  If you are age 16 or younger, your body mass index should be between 19-25. Your Body mass index is 42.9 kg/m. If this is out of the aformentioned range listed, please consider follow up with your Primary Care Provider.   ________________________________________________________  The [place] [facility] providers would like to encourage you to use [facility] to communicate with providers for non-urgent requests or questions.  Due to long hold times on the telephone, sending your provider a message by [facility] may be a faster and more efficient way to get a response.  Please allow 48 business hours for a response.  Please remember that this is for non-urgent requests.  _______________________________________________________  Follow up with [place] in 2 years or sooner if needed.  I appreciate the opportunity to care for you. Silvano Rusk, MD, [facility]

## 2021-09-05 NOTE — Progress Notes (Signed)
Ralph Mack 54 y.o. 04/24/1966 [id number]  Assessment & Plan:   Encounter Diagnoses  Name Primary?   Lymphocytic colitis Yes   Irritable bowel syndrome with diarrhea     He is doing well.  Continue dicyclomine follow-up in 2 years sooner if needed. Meds ordered this encounter  Medications   dicyclomine (BENTYL) 20 MG tablet    Sig: Take I tablet by mouth 4 times daily before meals and at bedtime as needed    Dispense:  360 tablet    Refill:  3     Subjective:   Chief Complaint: Follow-up of lymphocytic colitis  HPI 55 year old white man with a history of lymphocytic colitis on biopsies of a colonoscopy for chronic diarrhea in 2020, who has had a good response to chronic dicyclomine and is here for refill.  He was last seen in a virtual visit in August 2020.  He takes 320 mg dicyclomine tablets daily sometimes 4.  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  In the interim since last visit he has had an A. fib ablation and has a monitoring device in place implanted.  He did have transient anticoagulation treatment but is not on that now.  His diarrhea was worse when he was on Xarelto he said. No Known Allergies Current Meds  Medication Sig   albuterol (PROAIR HFA) 108 (90 Base) MCG/ACT inhaler Inhale 2 puffs into the lungs every 4 (four) hours as needed for wheezing or shortness of breath.   allopurinol (ZYLOPRIM) 300 MG tablet Take 1 tablet by mouth once daily (Patient taking differently: Take 300 mg by mouth daily.)   aspirin EC 81 MG tablet Take 1 tablet (81 mg total) by mouth daily. Swallow whole.   bismuth subsalicylate (PEPTO BISMOL) 262 MG chewable tablet Chew 524 mg by mouth daily as needed for indigestion.   busPIRone (BUSPAR) 15 MG tablet Take 1 tablet (15 mg total) by mouth 2 (two) times daily.   Cholecalciferol (VITAMIN D3) 5000 units TABS 5,000 IU OTC vitamin D3 daily. (Patient taking  differently: Take 5,000 Units by mouth daily.)   Dextrose-Fructose-Sod Citrate (NAUZENE) [phone] MG CHEW Chew 1 tablet by mouth daily as needed (nausea).   dicyclomine (BENTYL) 20 MG tablet Take I tablet by mouth 4 times daily before meals and at bedtime as needed (Patient taking differently: Take 20 mg by mouth See admin instructions. Take I tablet by mouth 2 to 3 times daily before meals and at bedtime as needed for irritable bowel syndrome)   diltiazem (CARDIZEM CD) 120 MG 24 hr capsule Take 1 capsule (120 mg total) by mouth daily.   diphenhydrAMINE HCl, Sleep, 50 MG CAPS Take 50 mg by mouth at bedtime as needed (for sleep).   EUTHYROX 50 MCG tablet Take 1 tablet by mouth once daily (Patient taking differently: Take 50 mcg by mouth daily before breakfast.)   ferrous sulfate 325 (65 FE) MG tablet Take 1 tablet (325 mg total) by mouth daily with breakfast.   fexofenadine (ALLEGRA) 180 MG tablet Take 180 mg by mouth daily as needed for allergies.   fluticasone (FLONASE) 50 MCG/ACT nasal spray Place 2 sprays into the nose daily.   hydrochlorothiazide (MICROZIDE) 12.5 MG capsule Take 1 capsule (12.5 mg total) by mouth daily. **PLEASE CONTACT OUR OFFICE TO SCHEDULE A FOLLOW UP FOR FUTURE MED REFILLS**   losartan (COZAAR) 50 MG tablet Take 1 tablet (50 mg total)  by mouth daily. **PLEASE CONTACT OUR OFFICE TO SCHEDULE A FOLLOW UP FOR FUTURE MED REFILLS**   mometasone-formoterol (DULERA) 200-5 MCG/ACT AERO Inhale 2 puffs into the lungs 2 (two) times daily.   oxymetazoline (AFRIN) 0.05 % nasal spray Place 1 spray into both nostrils 2 (two) times daily as needed for congestion.   propranolol (INDERAL) 20 MG tablet Take 1 tablet (20 mg total) by mouth 3 (three) times daily.   Sodium Chloride-Sodium Bicarb (AYR SALINE NASAL RINSE NA) Place 1 Dose into the nose daily as needed (congestion).   tadalafil (CIALIS) 10 MG tablet TAKE 1 TABLET BY MOUTH ONCE DAILY AS NEEDED FOR ERECTILE DYSFUNCTION (Patient taking  differently: Take 10 mg by mouth daily as needed for erectile dysfunction.)   Tiotropium Bromide Monohydrate (SPIRIVA RESPIMAT) 2.5 MCG/ACT AERS Inhale 2 puffs into the lungs daily.   vitamin B-12 1000 MCG tablet Take 1 tablet (1,000 mcg total) by mouth daily.   Past Medical History:  Diagnosis Date   Allergy    Arthritis    COPD (chronic obstructive pulmonary disease) (HCC)    Hx of colonic polyp 11/13/2016   10/2016 7 mm ssp/a - recall 2023   Hypertension    Hypothyroidism    IBS (irritable bowel syndrome)    Lymphocytic colitis 04/19/2019   Mild CAD 2009   Obesity    OSA (obstructive sleep apnea) 04/20/2021   Paroxysmal atrial flutter (HCC)    Prediabetes    Recurrent upper respiratory infection (URI)    Past Surgical History:  Procedure Laterality Date   A-FLUTTER ABLATION N/A 04/06/2021   Procedure: A-FLUTTER ABLATION;  Surgeon: Vickie Epley, MD;  Location: [facility];  Service: Cardiovascular;  Laterality: N/A;   CARDIAC ELECTROPHYSIOLOGY STUDY AND ABLATION     COLONOSCOPY  2018   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     family history includes Alcohol abuse in his maternal grandmother; Asthma in his mother; COPD in his mother; Cancer in his mother; Cancer - Other in his mother; Depression in his daughter and father; Diabetes in his paternal grandfather; Emphysema in his mother; Heart disease in his father; Kidney disease in his paternal grandfather; OCD in his daughter; Parkinson's disease in his father; Schizophrenia in his maternal grandmother.   Review of Systems  As above Objective:   Physical Exam BP 112/72   Pulse 62   Ht 5' 7.5" (1.715 m)   Wt 278 lb (126.1 kg)   SpO2 (!) 89%   BMI 42.90 kg/m  Lungs are clear Heart sounds normal S1-S2 no rubs or gallops Abdomen obese nontender

## 2021-09-07 ENCOUNTER — Other Ambulatory Visit: Payer: Self-pay

## 2021-09-07 ENCOUNTER — Encounter: Payer: Self-pay | Admitting: Internal Medicine

## 2021-09-07 ENCOUNTER — Other Ambulatory Visit: Payer: Self-pay | Admitting: Cardiology

## 2021-09-07 ENCOUNTER — Ambulatory Visit (INDEPENDENT_AMBULATORY_CARE_PROVIDER_SITE_OTHER): Payer: BC Managed Care – PPO | Admitting: Internal Medicine

## 2021-09-07 VITALS — BP 124/80 | HR 59 | Temp 97.9°F | Ht 67.5 in | Wt 277.8 lb

## 2021-09-07 DIAGNOSIS — Z129 Encounter for screening for malignant neoplasm, site unspecified: Secondary | ICD-10-CM | POA: Diagnosis not present

## 2021-09-07 DIAGNOSIS — Z87891 Personal history of nicotine dependence: Secondary | ICD-10-CM | POA: Diagnosis not present

## 2021-09-07 DIAGNOSIS — J449 Chronic obstructive pulmonary disease, unspecified: Secondary | ICD-10-CM | POA: Diagnosis not present

## 2021-09-07 DIAGNOSIS — G4733 Obstructive sleep apnea (adult) (pediatric): Secondary | ICD-10-CM | POA: Diagnosis not present

## 2021-09-07 NOTE — Patient Instructions (Addendum)
Moderate COPD (chronic obstructive pulmonary disease) (HCC)  - stable disease - resepect flu shot refusal - respect that you want to return o2 and do not feel you need it  Plan  - ok to return o2  - continue spiriva and dulera schedule with albuterol as needed   Cancer screening Stopped smoking with greater than 40 pack year history  - CT chest Nov 2022 without nodules or cancer or pneumonia or fibrosis - very low risk of cancer of lung next 1 year - glad smoking in remission since April 2022  Plan  - LDCT scan Nov 2023 per the screening program  OSA (obstructive sleep apnea)  Plan  - cPAP per young  Morbid (severe) obesity due to excess calories (HCC)  - weight gain after quitting smoking  Plan  - weight loss via PCP Abonza, Maritza, PA-C   Followup 3-6 months or sooner if needed

## 2021-09-07 NOTE — Progress Notes (Signed)
OV 01/25/2021  Subjective:  Patient ID: Ralph Mack, male , DOB: [birth year] , age 55 y.o. , MRN: [id number] , ADDRESS: [address] [place] [address] PCP [name], PA-C Patient Care Team: [name], PA-C as PCP - General Minus Breeding, MD as PCP - Cardiology (Cardiology) Melida Quitter, MD as Consulting Physician (Otolaryngology) Kennith Gain, MD as Consulting Physician (Allergy) Gatha Mayer, MD as Consulting Physician (Gastroenterology) Cottle, Billey Co., MD as Attending Physician (Psychiatry)  This Provider for this visit: Treatment Team:  Attending Provider: Brand Males, MD    01/25/2021 -   Chief Complaint  Patient presents with   Consult    SOB and wheezing, getting better slowly     HPI Ralph Mack 55 y.o. -new consultation.  He is a Administrator.  Former smoker.  He quit after his hospitalization.  History is gained from review of the chart and talking to him.  He also suffers from obesity, hypothyroidism, dyslipidemia, hypertension.  He was hospitalized on January 06, 2021.  At that time apparently was having fatigue for the last few to several weeks prior to the admission.  He was also noticed that he fell asleep on the steering wheel of his truck 1-2 times.  He drives around 5 miles a day.  At the time of hospitalization he was found to be hypoxemic with a left lower lobe pneumonia.  Pulmonary embolism was ruled out.  Incidental findings include subcarinal lymphadenopathy 1.5 cm along with other nodes.  And also 8 mm nodule along with another nodule.  He was found to be in paroxysmal atrial fibrillation.  Discharged on Eliquis.  He was also discharged on oxygen which he is now only using at night.  He says he does not need it at daytime.  In fact when we walked him 185 feet x 3 laps on room air he did not desaturate.  Overall he is feeling better.  He is very worried about his CT scan chest findings that I personally  visualized with him and documented below.  He is worried about cancer.  In addition he is worried about sleep apnea risk.  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  He is asking for FMLA form to be filled out.  Since discharge he also strained his left knee and has a knee brace. Noted with smoking history is concerned about presence of COPD.  SYMPTOM SCALE  01/25/2021   O2 use ra  Shortness of Breath 0 -> 5 scale with 5 being worst (score 6 If unable to do)  At rest 0  Simple tasks - showers, clothes change, eating, shaving 1  Household (dishes, doing bed, laundry) 1  Shopping 1  Walking level at own pace 1  Walking up Stairs 1  Total (30-36) Dyspnea Score 5  How bad is your cough? 0  How bad is your fatigue 1  How bad is nausea 0  How bad is vomiting?  00  How bad is diarrhea? 0  How bad is anxiety? 0  How bad is depression 0        Current walking desaturation test: Sitting pulse ox 93% heart rate 88/min.  Final pulse ox 92% after 3 laps.  Final heart rate 100/min.  Average pace no shortness of breath.    CT Chest data 01/06/21   Narrative & Impression  CLINICAL DATA:  Shortness of breath   EXAM: CT ANGIOGRAPHY CHEST WITH CONTRAST   TECHNIQUE: Multidetector  CT imaging of the chest was performed using the standard protocol during bolus administration of intravenous contrast. Multiplanar CT image reconstructions and MIPs were obtained to evaluate the vascular anatomy.   CONTRAST:  62m OMNIPAQUE IOHEXOL 350 MG/ML SOLN   COMPARISON:  January 06, 2021   FINDINGS: Cardiovascular: There is no demonstrable pulmonary embolus. There is no evident thoracic aortic aneurysm or dissection. Visualized great vessels appear unremarkable. There are foci coronary artery calcification. There is no pericardial effusion or pericardial thickening.   Mediastinum/Nodes: Thyroid appears unremarkable. There is a lymph node in the aortopulmonary window region measuring  1.4 x 1.3 cm. There is a lymph node to the right of the distal trachea measuring 1.3 x 1.1 cm. There is a subcarinal lymph node measuring 1.5 x 1.4 cm. A lymph node in the right hilum measures 1.0 x 1.0 cm. Several subcentimeter lymph nodes also noted in the mediastinum. No esophageal lesions are appreciable.   Lungs/Pleura: There are areas of patchy airspace opacity in the left lower lobe and inferior lingula. On axial slice 72 series 5, there is an 8 x 7 mm nodular opacity in the posterior segment of the left upper lobe near the major fissure. On axial slice 72 series 5, there is a 5 x 4 mm nodular opacity in the anterior segment of the right upper lobe. There is atelectatic change in the right lung base. Trachea and major bronchial structures appear normal. No pleural effusion. No pneumothorax.   Upper Abdomen: Visualized upper abdominal structures appear unremarkable.   Musculoskeletal: No blastic or lytic bone lesions evident. No chest wall lesions.   Review of the MIP images confirms the above findings.   IMPRESSION: 1. No demonstrable pulmonary embolus. No thoracic aortic aneurysm or dissection. There are foci of coronary artery calcification.   2. Areas of airspace opacity in the left lower lobe and inferior lingula consistent with pneumonia. Right base atelectasis.   3. 8 x 7 mm nodular opacity in the posterior segment left upper lobe near the major fissure. There is a 5 x 4 mm nodular opacity in the anterior segment of the right upper lobe. Non-contrast chest CT at 6-12 months is recommended. If the nodule is stable at time of repeat CT, then future CT at 18-24 months (from today's scan) is considered optional for low-risk patients, but is recommended for high-risk patients. This recommendation follows the consensus statement: Guidelines for Management of Incidental Pulmonary Nodules Detected on CT Images: From the Fleischner Society 2017; Radiology 2017;  284:228-243.   4. Several mildly enlarged lymph nodes of uncertain etiology. Lymph node prominence of this nature potentially could have reactive etiology given the areas of pneumonia. Neoplastic etiology cannot be excluded in this circumstance, however.     Electronically Signed   By: WLowella GripIII M.D.   On: 01/06/2021 11:59       ECHO 01/07/21   Sonographer:    TRoseanna RainbowRDCS  Referring Phys: [id number]     Sonographer Comments: Patient is morbidly obese and Technically difficult  study due to poor echo windows. Image acquisition challenging due to  patient body habitus.  IMPRESSIONS     1. Left ventricular ejection fraction, by estimation, is 60 to 65%. Left  ventricular ejection fraction by 3D volume is 67 %. The left ventricle has  normal function. The left ventricle has no regional wall motion  abnormalities. There is mild-to-moderate  concentric left ventricular hypertrophy. Left ventricular diastolic  parameters are consistent  with Grade II diastolic dysfunction  (pseudonormalization).   2. Right ventricular systolic function is normal. The right ventricular  size is normal.   3. The mitral valve is normal in structure. Trivial mitral valve  regurgitation. No evidence of mitral stenosis.   4. The aortic valve is tricuspid. Aortic valve regurgitation is not  visualized. No aortic stenosis is present.   5. Aortic dilatation noted. There is mild dilatation of the ascending  aorta, measuring 41 mm.   6. The inferior vena cava is dilated in size with <50% respiratory  variability, suggesting right atrial pressure of 15 mmHg.   Comparison(s): No prior Echocardiogram.   No flowsheet data found.  24/22- 54 yoM Smoker for sleep evaluation courtesy of Dr Chase Caller Medical problem list includes AFlutter, Orthostasis, CAD, HTN, Chronic Bronchitis, Recurrent Sinusitis, Allergic Rhinitis,  Lymphocytic Colitis, Hypothyroid, Cervical Disc Disease,  Obesity, Hypertriglyceridemia,  Meds include Ventolin HFA, Nicoderm CQ,  Hosp April for Acute Hypoxic Resp Failure, COPD exacerbation, Pneumonia RUL nodule, , Was discharged on O2 for sleep. Scheduled for PFT and f/u with Dr Chase Caller in June.  Epworth score- Body weight today- Covid vax-none Walk Test 02/20/21- desat to 85% during second lap with HR 90. Improved to 95% on 2L. Patient denied shortness of breath during walk. He notes daytime tiredness and has needed to pull over while driving. Hx snoring.  Nocturia x 4-5. Continues using O2 2l during sleep since [place] in April. Does have portable- not using. 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  Uses otc sleep med. 20 oz AM coffee.  Denies ENT surgery, but bothersome postnasal drip.Hx asthma, not feeling need now for inhalers.  Father died Parkinsons. Pt denies complex parasomni    DR  Annamaria Boots 04/20/21- Called and spoke with patient, he states that he already has a machine on it's way to him through the company he works for [employer]).  He missed the delivery for yesterday, but should receive it sometime today.  The machine is called [employer] and it is provided free of charge by his employer since his deductible has already been met for this year.  He gave me a contact # for Cory Munch that he has been working with to get the machine.  She can be reached at [phone] ext:  [phone].  The fax # is [phone] and the e-mail address is [email or web address].  I let him know I would give her a call to see how we would go about getting the download information from the machine.   Called [facility] and spoke with [facility], she advised that we can call and request a download and they can fax it to [place].  I provided our fax # and that the fax should be to the attention of Dr. Annamaria Boots.  They have everything they need as far as setting, sleep study and history.  06/25/21- Dr Annamaria Boots 89 yoM Smoker (18.5 pk yrs) followed for  OSA, Complicated by Chronic Respiratory Failure with Hypoxia ( Dr Chase Caller) Chronic Bronchitis, AFlutter, Orthostasis, CAD, HTN, Recurrent Sinusitis, Allergic Rhinitis,  Lymphocytic Colitis, Hypothyroid, Cervical Disc Disease, Obesity, Hypertriglyceridemia,  Meds include Ventolin HFA, Nicoderm CQ,  O2 -dc'd per patient request- not using HST- 03/28/21- AHI 60/ hr, desaturation 49-82%, body weight 252 lbs CPAP  auto 5-20    / [employer] Driver DME ([phone]) Download- Body weight today-       xxxxxxxxxxxxxxxxxxxxx 08/17/2021 Follow up : COPD and OSA  Post hospital follow up  55 year old  male former smoker (quit 12/2020) followed for mild COPD and obstructive sleep apnea Medial history Atrial Flutter, Lymphocytic Colitis , Diastolic Dysfunction  Truck Driver for [employer]   TEST/EVENTS :  HST- 03/28/21- AHI 60/ hr, desaturation 49-82%, body weight 252 lbs PFTs March 08, 2021 FEV1 66%, ratio 70, FVC 72%, no significant bronchodilator response, DLCO 65%  CT chest 08/11/21 -Lungs clear , negative PE   Patient returns for follow up.  Patient has underlying moderate COPD.  Patient was recently hospitalized for a COPD exacerbation with acute respiratory failure with hypoxemia.  He was treated with empiric antibiotics and steroids.  Discharged on a prednisone taper.  Patient was started on oxygen at discharge at 2 L with activity.  He was started on Dulera at discharge.  CT chest was negative for PE.  Showed clear lungs.  Patient says since discharge he is feeling some better but continues to get short of breath with activities.  He did obtain a pulse oximeter.  And notices that his oxygen levels do drop.  Seems to be worse at nighttime.  Today in the office walk test with a forehead probe showed no significant desaturations with O2 saturations maintaining around 89 to 93% on room air.  Patient says he has not been wearing his oxygen very much since discharge.  Patient has underlying obstructive  sleep apnea.  Is on CPAP at bedtime.  Patient says he is trying to wear his CPAP every single night.  Usually gets in about 6 hours.  Patient says at times it does not feel that his CPAP is strong enough.  He also notices that his oxygen at times is low whenever he checks it at nighttime.  CPAP download shows excellent compliance with daily average usage at 6 hours.  Patient is on auto CPAP 5 to 20 cm H2O.  Daily average pressure at 6 cm of H2O.  AHI 3.7/hour.  Patient does go through safe sleep drivers through [employer].  Patient has quit smoking.  He smoked about a pack a day for the last 40 years.  We discussed low-dose CT screening program.  Patient is agreeable.Declines flu and covid vaccine .      OV 09/07/2021  Subjective:  Patient ID: Ralph Mack, male , DOB: 06-19-1966 , age 60 y.o. , MRN: [id number] , ADDRESS: [address] [place] [address] PCP [name], PA-C Patient Care Team: [name], PA-C as PCP - General Minus Breeding, MD as PCP - Cardiology (Cardiology) Vickie Epley, MD as PCP - Electrophysiology (Cardiology) Melida Quitter, MD as Consulting Physician (Otolaryngology) Kennith Gain, MD as Consulting Physician (Allergy) Gatha Mayer, MD as Consulting Physician (Gastroenterology) Cottle, Billey Co., MD as Attending Physician (Psychiatry)  This Provider for this visit: Treatment Team:  Attending Provider: Brand Males, MD    09/07/2021 -   Chief Complaint  Patient presents with   Follow-up    Pt states since being placed on the maintenance inhalers, his breathing has been better.     HPI Ralph Mack 55 y.o. -returns for follow-up.  I only saw him once in April 2022.  After that he had pulmonary function test in June 2022 and saw a Designer, jewellery.  He had gold stage II COPD FEV1 64%.  He was asymptomatic at that time.  Nurse practitioner dispensed albuterol as needed.  Then in November 2022 ended up  with a COPD exacerbation in the ER.  Apparently the ER doctor informed him that he should  have been on maintenance inhaler.  He is upset that we did not do that.  Nevertheless currently is on [place] and is doing well.  His alpha-1 is MM.  From the hospital in November 2022 he was given oxygen.  He does not want this.  Last month our nurse practitioner walked him and his saturations were above 88%.  He wants to return it.  I have agreed and respected his personal choice.  He is also due for flu shot  Cancer screening and smoking history of 40 pack: He smoking is in remission since April 2022.  He is excited about this.  He had a CT angiogram chest in the ER November 2022.  The lung fields are clear.  He still wanted low-dose CT scan of the chest done now before the end of the year 2022.  I informed him the risk of lung cancer and is extremely low less than 1%.  He felt reassured.  He has been referred to the low-dose CT scan program by the nurse practitioner recently   Obesity: After quitting smoking is gained weight  Sleep apnea: This is being coordinated through his employer and Dr. Annamaria Boots.  He is using his CPAP without oxygen. CT Chest data  No results found.    PFT  PFT Results Latest Ref Rng & Units 03/08/2021  FVC-Pre L 3.04  FVC-Predicted Pre % 69  FVC-Post L 3.15  FVC-Predicted Post % 72  Pre FEV1/FVC % % 71  Post FEV1/FCV % % 70  FEV1-Pre L 2.16  FEV1-Predicted Pre % 64  FEV1-Post L 2.22  DLCO uncorrected ml/min/mmHg 16.68  DLCO UNC% % 65  DLCO corrected ml/min/mmHg 16.68  DLCO COR %Predicted % 65  DLVA Predicted % 97  TLC L 5.65  TLC % Predicted % 88  RV % Predicted % 88       has a past medical history of Acute on chronic respiratory failure (HCC) (01/06/2021), Allergy, Arthritis, COPD (chronic obstructive pulmonary disease) ([place]), colonic polyp (11/13/2016), Hypertension, Hypothyroidism, IBS (irritable bowel syndrome), Lymphocytic colitis (04/19/2019), Mild CAD  (2009), Obesity, OSA (obstructive sleep apnea) (04/20/2021), Paroxysmal atrial flutter ([place]), Prediabetes, and Recurrent upper respiratory infection (URI).   reports that he quit smoking about 7 months ago. His smoking use included cigarettes. He has a 18.50 pack-year smoking history. He has never used smokeless tobacco.  Past Surgical History:  Procedure Laterality Date   A-FLUTTER ABLATION N/A 04/06/2021   Procedure: A-FLUTTER ABLATION;  Surgeon: Vickie Epley, MD;  Location: [facility];  Service: Cardiovascular;  Laterality: N/A;   CARDIAC ELECTROPHYSIOLOGY STUDY AND ABLATION     COLONOSCOPY  2018   CORONARY ANGIOGRAM  2009    No Known Allergies  Immunization History  Administered Date(s) Administered   Influenza,inj,Quad PF,6+ Mos 07/26/2015    Family History  Problem Relation Age of Onset   Asthma Mother    COPD Mother    Cancer Mother        Skin   Cancer - Other Mother        bone, liver, breast, lung   Emphysema Mother    Heart disease Father        Pacemaker   Parkinson's disease Father    Depression Father    Depression Daughter    OCD Daughter    Alcohol abuse Maternal Grandmother    Schizophrenia Maternal Grandmother    Diabetes Paternal Grandfather    Kidney disease Paternal Grandfather    Colon  polyps Neg Hx    Colon cancer Neg Hx    Esophageal cancer Neg Hx    Stomach cancer Neg Hx    Rectal cancer Neg Hx      Current Outpatient Medications:    albuterol (PROAIR HFA) 108 (90 Base) MCG/ACT inhaler, Inhale 2 puffs into the lungs every 4 (four) hours as needed for wheezing or shortness of breath., Disp: 18 g, Rfl: 3   allopurinol (ZYLOPRIM) 300 MG tablet, Take 1 tablet by mouth once daily (Patient taking differently: Take 300 mg by mouth daily.), Disp: 90 tablet, Rfl: 0   aspirin EC 81 MG tablet, Take 1 tablet (81 mg total) by mouth daily. Swallow whole., Disp: 90 tablet, Rfl: 3   bismuth subsalicylate (PEPTO BISMOL) 262 MG chewable tablet,  Chew 524 mg by mouth daily as needed for indigestion., Disp: , Rfl:    busPIRone (BUSPAR) 15 MG tablet, Take 1 tablet (15 mg total) by mouth 2 (two) times daily., Disp: 180 tablet, Rfl: 1   Cholecalciferol (VITAMIN D3) 5000 units TABS, 5,000 IU OTC vitamin D3 daily. (Patient taking differently: Take 5,000 Units by mouth daily.), Disp: 90 tablet, Rfl: 3   Dextrose-Fructose-Sod Citrate (NAUZENE) [phone] MG CHEW, Chew 1 tablet by mouth daily as needed (nausea)., Disp: , Rfl:    dicyclomine (BENTYL) 20 MG tablet, Take I tablet by mouth 4 times daily before meals and at bedtime as needed, Disp: 360 tablet, Rfl: 3   diltiazem (CARDIZEM CD) 120 MG 24 hr capsule, Take 1 capsule (120 mg total) by mouth daily., Disp: 90 capsule, Rfl: 3   diphenhydrAMINE HCl, Sleep, 50 MG CAPS, Take 50 mg by mouth at bedtime as needed (for sleep)., Disp: , Rfl:    EUTHYROX 50 MCG tablet, Take 1 tablet by mouth once daily (Patient taking differently: Take 50 mcg by mouth daily before breakfast.), Disp: 90 tablet, Rfl: 0   ferrous sulfate 325 (65 FE) MG tablet, Take 1 tablet (325 mg total) by mouth daily with breakfast., Disp: 30 tablet, Rfl: 0   fexofenadine (ALLEGRA) 180 MG tablet, Take 180 mg by mouth daily as needed for allergies., Disp: , Rfl:    fluticasone (FLONASE) 50 MCG/ACT nasal spray, Place 2 sprays into the nose daily., Disp: , Rfl:    hydrochlorothiazide (MICROZIDE) 12.5 MG capsule, Take 1 capsule (12.5 mg total) by mouth daily. **PLEASE CONTACT OUR OFFICE TO SCHEDULE A FOLLOW UP FOR FUTURE MED REFILLS**, Disp: 90 capsule, Rfl: 0   losartan (COZAAR) 50 MG tablet, Take 1 tablet (50 mg total) by mouth daily. **PLEASE CONTACT OUR OFFICE TO SCHEDULE A FOLLOW UP FOR FUTURE MED REFILLS**, Disp: 90 tablet, Rfl: 0   mometasone-formoterol (DULERA) 200-5 MCG/ACT AERO, Inhale 2 puffs into the lungs 2 (two) times daily., Disp: 1 each, Rfl: 0   oxymetazoline (AFRIN) 0.05 % nasal spray, Place 1 spray into both nostrils 2 (two)  times daily as needed for congestion., Disp: , Rfl:    propranolol (INDERAL) 20 MG tablet, Take 1 tablet (20 mg total) by mouth 3 (three) times daily., Disp: , Rfl:    Sodium Chloride-Sodium Bicarb (AYR SALINE NASAL RINSE NA), Place 1 Dose into the nose daily as needed (congestion)., Disp: , Rfl:    tadalafil (CIALIS) 10 MG tablet, TAKE 1 TABLET BY MOUTH ONCE DAILY AS NEEDED FOR ERECTILE DYSFUNCTION (Patient taking differently: Take 10 mg by mouth daily as needed for erectile dysfunction.), Disp: 10 tablet, Rfl: 0   Tiotropium Bromide Monohydrate (SPIRIVA RESPIMAT) 2.5  MCG/ACT AERS, Inhale 2 puffs into the lungs daily., Disp: 1 g, Rfl: 5   vitamin B-12 1000 MCG tablet, Take 1 tablet (1,000 mcg total) by mouth daily., Disp: 30 tablet, Rfl: 0      Objective:   Vitals:   09/07/21 1045  BP: 124/80  Pulse: (!) 59  Temp: 97.9 F (36.6 C)  TempSrc: Oral  SpO2: 93%  Weight: 277 lb 12.8 oz (126 kg)  Height: 5' 7.5" (1.715 m)    Estimated body mass index is 42.87 kg/m as calculated from the following:   Height as of this encounter: 5' 7.5" (1.715 m).   Weight as of this encounter: 277 lb 12.8 oz (126 kg).  _0 @  Filed Weights   09/07/21 1045  Weight: 277 lb 12.8 oz (126 kg)     Physical Exam General: No distress. obese Neuro: Alert and Oriented x 3. GCS 15. Speech normal Psych: Pleasant Resp:  Barrel Chest - no.  Wheeze - no, Crackles - no, No overt respiratory distress CVS: Normal heart sounds. Murmurs - no Ext: Stigmata of Connective Tissue Disease - no HEENT: Normal upper airway. PEERL +. No post nasal drip        Assessment:       ICD-10-CM   1. Moderate COPD (chronic obstructive pulmonary disease) (HCC)  J44.9     2. Cancer screening  Z12.9     3. Stopped smoking with greater than 40 pack year history  Z87.891     4. OSA (obstructive sleep apnea)  G47.33     5. Morbid (severe) obesity due to excess calories (HCC)  E66.01          Plan:      Patient Instructions  Moderate COPD (chronic obstructive pulmonary disease) (HCC)  - stable disease - resepect flu shot refusal - respect that you want to return o2 and do not feel you need it  Plan  - ok to return o2  - continue spiriva and dulera schedule with albuterol as needed   Cancer screening Stopped smoking with greater than 40 pack year history  - CT chest Nov 2022 without nodules or cancer or pneumonia or fibrosis - very low risk of cancer of lung next 1 year - glad smoking in remission since April 2022  Plan  - LDCT scan Nov 2023 per the screening program  OSA (obstructive sleep apnea)  Plan  - cPAP per young  Morbid (severe) obesity due to excess calories ([place])  - weight gain after quitting smoking  Plan  - weight loss via PCP Abonza, Maritza, PA-C   Followup 3-6 months or sooner if needed    SIGNATURE    Dr. Brand Males, M.D., F.C.C.P,  Pulmonary and Critical Care Medicine Staff Physician, [place] Director - Interstitial Lung Disease  Program  Pulmonary [place] [facility] [place], [place], [address]  Pager: [phone], If no answer or between  15:00h - 7:00h: call [phone] Telephone: [phone]  11:32 AM 09/07/2021

## 2021-09-07 NOTE — Addendum Note (Signed)
Addended by: Lorretta Harp on: 09/07/2021 11:51 AM   Modules accepted: Orders

## 2021-09-12 DIAGNOSIS — J449 Chronic obstructive pulmonary disease, unspecified: Secondary | ICD-10-CM | POA: Diagnosis not present

## 2021-09-13 ENCOUNTER — Telehealth: Payer: Self-pay | Admitting: *Deleted

## 2021-09-13 NOTE — Telephone Encounter (Signed)
ONO results per Rexene Edison NP: Begin oxygen 2L at HS with CPAP, do repeat ONO after oxygen set up.  CPAP download showed good compliance.  ATC patient x1, no answer, LVM to return call.

## 2021-09-13 NOTE — Telephone Encounter (Signed)
Called and spoke with patient regarding his ONO.  He states he did the ONO with his CPAP on.  Nothing further needed.

## 2021-09-17 ENCOUNTER — Encounter: Payer: Self-pay | Admitting: Internal Medicine

## 2021-09-18 ENCOUNTER — Encounter: Payer: Self-pay | Admitting: Cardiology

## 2021-09-18 MED ORDER — MOMETASONE FURO-FORMOTEROL FUM 200-5 MCG/ACT IN AERO
2.0000 | INHALATION_SPRAY | Freq: Two times a day (BID) | RESPIRATORY_TRACT | 6 refills | Status: DC
Start: 2021-09-18 — End: 2022-08-02

## 2021-09-20 ENCOUNTER — Other Ambulatory Visit: Payer: Self-pay | Admitting: Physician Assistant

## 2021-09-20 DIAGNOSIS — I10 Essential (primary) hypertension: Secondary | ICD-10-CM

## 2021-09-28 ENCOUNTER — Telehealth: Payer: Self-pay | Admitting: *Deleted

## 2021-09-28 NOTE — Telephone Encounter (Signed)
Called patient to discuss his concern regarding his IRL.  He states that [facility] has requested more clinicals from [place] on 11/15 and 12/9 and still have not received anything.  He provided me with a number to [employer] to call and see what they are missing.  I let him know I would speak with our billing department as well as call [employer] and get back to him on Tues.  He was appreciative of my call.

## 2021-10-02 ENCOUNTER — Ambulatory Visit (INDEPENDENT_AMBULATORY_CARE_PROVIDER_SITE_OTHER): Payer: BC Managed Care – PPO

## 2021-10-02 ENCOUNTER — Other Ambulatory Visit: Payer: Self-pay

## 2021-10-02 DIAGNOSIS — I483 Typical atrial flutter: Secondary | ICD-10-CM | POA: Diagnosis not present

## 2021-10-02 DIAGNOSIS — E039 Hypothyroidism, unspecified: Secondary | ICD-10-CM

## 2021-10-02 LAB — CUP PACEART REMOTE DEVICE CHECK
Date Time Interrogation Session: 20230103122337
Implantable Pulse Generator Implant Date: 20220809

## 2021-10-02 MED ORDER — LEVOTHYROXINE SODIUM 50 MCG PO TABS: 50.0000 ug | ORAL_TABLET | Freq: Every day | ORAL | 0 refills | Status: DC

## 2021-10-03 NOTE — Telephone Encounter (Signed)
Spoke with patient and let him know I am still working on finding out more regarding the records request and will be back in touch with him by the end of the week.

## 2021-10-05 ENCOUNTER — Other Ambulatory Visit: Payer: Self-pay

## 2021-10-05 ENCOUNTER — Encounter: Payer: Self-pay | Admitting: Psychiatry

## 2021-10-05 ENCOUNTER — Ambulatory Visit: Payer: BC Managed Care – PPO | Admitting: Psychiatry

## 2021-10-05 DIAGNOSIS — F410 Panic disorder [episodic paroxysmal anxiety] without agoraphobia: Secondary | ICD-10-CM | POA: Diagnosis not present

## 2021-10-05 DIAGNOSIS — F411 Generalized anxiety disorder: Secondary | ICD-10-CM

## 2021-10-05 MED ORDER — BUSPIRONE HCL 15 MG PO TABS: 15.0000 mg | ORAL_TABLET | Freq: Every morning | ORAL | 2 refills | Status: DC

## 2021-10-05 NOTE — Progress Notes (Signed)
Ralph Mack [id number] December 11, 1965 56 y.o.  Subjective:   Patient ID:  Ralph Mack is a 56 y.o. (DOB 1966/02/18) male.  Chief Complaint:  Chief Complaint  Patient presents with   Follow-up    Anxiety    HPI Ralph Mack presents to the office today for follow-up of anxiety.   He reports that he was hospitalized in ICU in April for pneumonia. He stopped smoking at that time. He was out of work for 5 months. He was started on a cPap. He reports that he had very low energy. He reports improved sleep quality since he started cPap. He has been back to work.   He reports that he has had some financial stress with leave of absence. Wife bought a $10,000 embroidery machine right before his hospitalization. Wife is currently not working.   He reports that there have been changes at work that have caused his stress.   He reports that he forgot to take Buspar in the morning- "and I seem to be ok, other than the normal frustrations of life." He reports that he has had some panic attacks with breathing issues around the time of pneumonia. Denies any recent panic attacks. He reports adequate sleep and able to sleep 3-4 hours until awakening. He reports that he has had some sadness in response to some situational stress. Energy is ok. Appetite has been ok. He reports concentration has been ok. Denies SI.   Has reduced ETOH intake.  Has been with [employer] 16 years. He continues to drive long distances.   Wife's 60 yo mother and sister with developmental d/o are staying on their property. Sister-in-law does not want to a group home.   Past Psychiatric Medication Trials: Prozac-ineffective Sertraline Alprazolam- effective Propranolol-ineffective Buspar  GAD-7    Flowsheet Row Office Visit from 04/24/2021 in [place] at [facility] Visit from 03/22/2019 in [place] at [facility] Visit from 01/06/2019 in [place] at [facility]  Visit from 10/05/2018 in [place] at [facility] Visit from 08/25/2018 in [place] at [facility]  Total GAD-7 Score 0 0 1 15 14       [place] Visit from 04/24/2021 in [place] at [facility] Visit from 01/30/2021 in [place] at [facility] Visit from 01/25/2021 in [place] at [facility] Visit from 01/16/2021 in [place] at [facility] Visit from 12/18/2020 in [place] at [facility]  PHQ-2 Total Score 0 0 0 0 1  PHQ-9 Total Score 0 0 2 4 7       Flowsheet Row ED to Hosp-Admission (Discharged) from 08/11/2021 in [place] ED from 06/30/2021 in [facility] at [facility]  Admission (Discharged) from 04/06/2021 in [place] CATH LAB  C-SSRS RISK CATEGORY No Risk No Risk No Risk        Review of Systems:  Review of Systems  Gastrointestinal: Negative.   Musculoskeletal:  Positive for arthralgias. Negative for gait problem.  Neurological:  Negative for tremors.  Psychiatric/Behavioral:         Please refer to HPI   Medications: I have reviewed the patient's current medications.  Current Outpatient Medications  Medication Sig Dispense Refill   albuterol (PROAIR HFA) 108 (90 Base) MCG/ACT inhaler  Inhale 2 puffs into the lungs every 4 (four) hours as needed for wheezing or shortness of breath. 18 g 3   allopurinol (ZYLOPRIM) 300 MG tablet Take 1 tablet by mouth once daily (Patient taking differently: Take 300 mg by mouth daily.) 90 tablet 0   aspirin EC 81 MG tablet Take 1 tablet (81 mg total) by mouth daily. Swallow whole. 90 tablet 3   Cholecalciferol (VITAMIN D3) 5000 units TABS 5,000 IU OTC vitamin D3 daily. (Patient taking differently: Take 5,000 Units by mouth daily.) 90 tablet 3   dicyclomine (BENTYL) 20 MG tablet Take I tablet by mouth 4 times  daily before meals and at bedtime as needed 360 tablet 3   diltiazem (CARDIZEM CD) 120 MG 24 hr capsule Take 1 capsule (120 mg total) by mouth daily. 90 capsule 3   diphenhydrAMINE HCl, Sleep, 50 MG CAPS Take 50 mg by mouth at bedtime as needed (for sleep).     fexofenadine (ALLEGRA) 180 MG tablet Take 180 mg by mouth daily as needed for allergies.     hydrochlorothiazide (MICROZIDE) 12.5 MG capsule TAKE 1 CAPSULE BY MOUTH ONCE DAILY . APPOINTMENT REQUIRED FOR FUTURE REFILLS 30 capsule 0   levothyroxine (EUTHYROX) 50 MCG tablet Take 1 tablet (50 mcg total) by mouth daily. 90 tablet 0   losartan (COZAAR) 50 MG tablet TAKE 1 TABLET BY MOUTH ONCE DAILY . APPOINTMENT REQUIRED FOR FUTURE REFILLS 30 tablet 0   mometasone-formoterol (DULERA) 200-5 MCG/ACT AERO Inhale 2 puffs into the lungs 2 (two) times daily. 13 g 6   oxymetazoline (AFRIN) 0.05 % nasal spray Place 1 spray into both nostrils 2 (two) times daily as needed for congestion.     propranolol (INDERAL) 20 MG tablet Take 1 tablet (20 mg total) by mouth 3 (three) times daily.     Sodium Chloride-Sodium Bicarb (AYR SALINE NASAL RINSE NA) Place 1 Dose into the nose daily as needed (congestion).     tadalafil (CIALIS) 10 MG tablet Take 1 tablet (10 mg total) by mouth daily as needed for erectile dysfunction. 10 tablet 0   Tiotropium Bromide Monohydrate (SPIRIVA RESPIMAT) 2.5 MCG/ACT AERS Inhale 2 puffs into the lungs daily. 1 g 5   vitamin B-12 (CYANOCOBALAMIN) 100 MCG tablet Take 100 mcg by mouth daily.     busPIRone (BUSPAR) 15 MG tablet Take 1 tablet (15 mg total) by mouth in the morning. 90 tablet 2   Dextrose-Fructose-Sod Citrate (NAUZENE) [phone] MG CHEW Chew 1 tablet by mouth daily as needed (nausea). (Patient not taking: Reported on 10/05/2021)     ferrous sulfate 325 (65 FE) MG tablet Take 1 tablet (325 mg total) by mouth daily with breakfast. 30 tablet 0   fluticasone (FLONASE) 50 MCG/ACT nasal spray Place 2 sprays into the nose daily.  (Patient not taking: Reported on 10/05/2021)     No current facility-administered medications for this visit.    Medication Side Effects: None  Allergies: No Known Allergies  Past Medical History:  Diagnosis Date   Acute on chronic respiratory failure ([place]) 01/06/2021   Walk Test 02/20/21- desat to 85% during second lap with HR 90. Improved to 95% on 2L. Patient denied shortness of breath during walk.   Allergy    Arthritis    COPD (chronic obstructive pulmonary disease) (HCC)    Hx of colonic polyp 11/13/2016   10/2016 7 mm ssp/a - recall 2023   Hypertension    Hypothyroidism    IBS (irritable bowel syndrome)  Lymphocytic colitis 04/19/2019   Mild CAD 2009   Obesity    OSA (obstructive sleep apnea) 04/20/2021   Paroxysmal atrial flutter (HCC)    Prediabetes    Recurrent upper respiratory infection (URI)     Past Medical History, Surgical history, Social history, and Family history were reviewed and updated as appropriate.   Please see review of systems for further details on the patient's review from today.   Objective:   Physical Exam:  There were no vitals taken for this visit.  Physical Exam Constitutional:      General: He is not in acute distress. Musculoskeletal:        General: No deformity.  Neurological:     Mental Status: He is alert and oriented to person, place, and time.     Coordination: Coordination normal.  Psychiatric:        Attention and Perception: Attention and perception normal. He does not perceive auditory or visual hallucinations.        Mood and Affect: Mood is not depressed. Affect is not labile, blunt, angry or inappropriate.        Speech: Speech normal.        Behavior: Behavior normal.        Thought Content: Thought content normal. Thought content is not paranoid or delusional. Thought content does not include homicidal or suicidal ideation. Thought content does not include homicidal or suicidal plan.        Cognition and Memory:  Cognition and memory normal.        Judgment: Judgment normal.     Comments: Insight intact Anxious in response to financial stressors    Lab Review:     Component Value Date/Time   NA 136 08/13/2021 0504   NA 138 03/23/2021 1046   K 4.6 08/13/2021 0504   CL 95 (L) 08/13/2021 0504   CO2 34 (H) 08/13/2021 0504   GLUCOSE 132 (H) 08/13/2021 0504   BUN 19 08/13/2021 0504   BUN 16 03/23/2021 1046   CREATININE 0.73 08/13/2021 0504   CALCIUM 8.9 08/13/2021 0504   PROT 6.4 (L) 08/11/2021 1628   PROT 6.2 01/16/2021 1531   ALBUMIN 3.8 08/11/2021 1628   ALBUMIN 4.2 01/16/2021 1531   AST 28 08/11/2021 1628   ALT 24 08/11/2021 1628   ALKPHOS 57 08/11/2021 1628   BILITOT 0.6 08/11/2021 1628   BILITOT 0.2 01/16/2021 1531   GFRNONAA >60 08/13/2021 0504   GFRAA 121 08/07/2020 1100       Component Value Date/Time   WBC 14.8 (H) 08/13/2021 0504   RBC 3.70 (L) 08/13/2021 0504   HGB 9.8 (L) 08/13/2021 0504   HGB 13.7 03/23/2021 1046   HCT 32.4 (L) 08/13/2021 0504   HCT 39.7 03/23/2021 1046   PLT 209 08/13/2021 0504   PLT 193 03/23/2021 1046   MCV 87.6 08/13/2021 0504   MCV 94 03/23/2021 1046   MCH 26.5 08/13/2021 0504   MCHC 30.2 08/13/2021 0504   RDW 13.6 08/13/2021 0504   RDW 12.6 03/23/2021 1046   LYMPHSABS 1.5 08/11/2021 1628   LYMPHSABS 1.7 03/23/2021 1046   MONOABS 0.9 08/11/2021 1628   EOSABS 0.4 08/11/2021 1628   EOSABS 0.3 03/23/2021 1046   BASOSABS 0.1 08/11/2021 1628   BASOSABS 0.1 03/23/2021 1046    No results found for: POCLITH, LITHIUM   No results found for: PHENYTOIN, PHENOBARB, VALPROATE, CBMZ   .res Assessment: Plan:   Patient seen for 30 minutes and time spent discussing anxiety in  response to situational stressors.  He reports that his anxiety signs and symptoms have been manageable overall, particularly since respiratory illness resolved and respiratory signs and symptoms have stabilized.  He reports that BuSpar 15 mg daily in the morning has been  effective for his anxiety symptoms and would like to continue BuSpar at this dose. Discussed resuming psychotherapy with Rinaldo Cloud, LCSW if needed.  Discussed that therapist could be seen on an as needed basis when occasional acute stressors cause increased anxiety. Will continue BuSpar 15 mg in the morning for anxiety. Patient to follow-up in 6 months or sooner if clinically indicated. Patient advised to contact office with any questions, adverse effects, or acute worsening in signs and symptoms.    Finnis was seen today for follow-up.  Diagnoses and all orders for this visit:  Generalized anxiety disorder -     busPIRone (BUSPAR) 15 MG tablet; Take 1 tablet (15 mg total) by mouth in the morning.  Panic disorder     Please see After Visit Summary for patient specific instructions.  Future Appointments  Date Time Provider [place]  04/04/2022 10:00 AM Thayer Headings, PMHNP CP-CP None    No orders of the defined types were placed in this encounter.   -------------------------------

## 2021-10-06 NOTE — Assessment & Plan Note (Signed)
Medical reason for CPAP and compliance goals again reviewed. Plan-continue auto 5-20.  DME company to provide download.

## 2021-10-06 NOTE — Assessment & Plan Note (Signed)
He stopped using oxygen and turned it back in.  Followed by Dr. Chase Caller.

## 2021-10-08 NOTE — Telephone Encounter (Signed)
Spoke with patient on Friday evening and let him know that OV for date on ILR implant of 05/04/21 is still pending but the remote monitoring bills for 10/22 and 11/22 have been taken off.  Let him know Dr. Claudie Revering recommendation of coming in for OV every 3-4 months to have the ILR interrogated due to the fact that his monthly remote charge is $149.00.  His OV co-pay is $75 and he wanted to know what he would be billed.  I let him know I only knew his co-pay was $75 and the bill would be based upon what his deductible is and how much he has paid into that.  He is aware by turning off the remote monthly monitoring of his ILR device that we would not know until he is in for OV if he has had any irregularities. Monthly monitoring has be deactivated and all remotes have been removed from his appointments. He will need to be set up for OV in March to interrogate the device.  Recall is in system for OV with EP APP for March of 2023.

## 2021-10-12 NOTE — Progress Notes (Signed)
Carelink Summary Report / Loop Recorder 

## 2021-10-25 ENCOUNTER — Other Ambulatory Visit: Payer: Self-pay | Admitting: Physician Assistant

## 2021-10-25 DIAGNOSIS — I10 Essential (primary) hypertension: Secondary | ICD-10-CM

## 2021-10-30 NOTE — Progress Notes (Signed)
error 

## 2021-11-15 ENCOUNTER — Other Ambulatory Visit: Payer: Self-pay | Admitting: Physician Assistant

## 2021-11-15 DIAGNOSIS — I10 Essential (primary) hypertension: Secondary | ICD-10-CM

## 2021-11-20 NOTE — Progress Notes (Unsigned)
Established Patient Office Visit  Subjective:  Patient ID: Ralph Mack, male    DOB: 05-04-1966  Age: 56 y.o. MRN: [id number]  CC: No chief complaint on file.   HPI Ralph Mack presents for ***  Past Medical History:  Diagnosis Date   Acute on chronic respiratory failure ([place]) 01/06/2021   Walk Test 02/20/21- desat to 85% during second lap with HR 90. Improved to 95% on 2L. Patient denied shortness of breath during walk.   Allergy    Arthritis    COPD (chronic obstructive pulmonary disease) (HCC)    Hx of colonic polyp 11/13/2016   10/2016 7 mm ssp/a - recall 2023   Hypertension    Hypothyroidism    IBS (irritable bowel syndrome)    Lymphocytic colitis 04/19/2019   Mild CAD 2009   Obesity    OSA (obstructive sleep apnea) 04/20/2021   Paroxysmal atrial flutter (HCC)    Prediabetes    Recurrent upper respiratory infection (URI)     Past Surgical History:  Procedure Laterality Date   A-FLUTTER ABLATION N/A 04/06/2021   Procedure: A-FLUTTER ABLATION;  Surgeon: Vickie Epley, MD;  Location: [place] [facility];  Service: Cardiovascular;  Laterality: N/A;   CARDIAC ELECTROPHYSIOLOGY STUDY AND ABLATION     COLONOSCOPY  2018   CORONARY ANGIOGRAM  2009    Family History  Problem Relation Age of Onset   Asthma Mother    COPD Mother    Cancer Mother        Skin   Cancer - Other Mother        bone, liver, breast, lung   Emphysema Mother    Heart disease Father        Pacemaker   Parkinson's disease Father    Depression Father    Depression Daughter    OCD Daughter    Alcohol abuse Maternal Grandmother    Schizophrenia Maternal Grandmother    Diabetes Paternal Grandfather    Kidney disease Paternal Grandfather    Colon polyps Neg Hx    Colon cancer Neg Hx    Esophageal cancer Neg Hx    Stomach cancer Neg Hx    Rectal cancer Neg Hx     Social History   Socioeconomic History   Marital status: Married    Spouse name: Not on file   Number of children: 2    Years of education: Not on file   Highest education level: Not on file  Occupational History   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     Social Determinants of Health   Financial Resource Strain: Not on file  Food Insecurity: Not on file  Transportation Needs: Not on file  Physical Activity: Not on file  Stress: Not on file  Social Connections: Not  on file  Intimate Partner Violence: Not on file    Outpatient Medications Prior to Visit  Medication Sig Dispense Refill   albuterol (PROAIR HFA) 108 (90 Base) MCG/ACT inhaler Inhale 2 puffs into the lungs every 4 (four) hours as needed for wheezing or shortness of breath. 18 g 3   allopurinol (ZYLOPRIM) 300 MG tablet Take 1 tablet by mouth once daily (Patient taking differently: Take 300 mg by mouth daily.) 90 tablet 0   aspirin EC 81 MG tablet Take 1 tablet (81 mg total) by mouth daily. Swallow whole. 90 tablet 3   busPIRone (BUSPAR) 15 MG tablet Take 1 tablet (15 mg total) by mouth in the morning. 90 tablet 2   Cholecalciferol (VITAMIN D3) 5000 units TABS 5,000 IU OTC vitamin D3 daily. (Patient taking differently: Take 5,000 Units by mouth daily.) 90 tablet 3   Dextrose-Fructose-Sod Citrate (NAUZENE) [phone] MG CHEW Chew 1 tablet by mouth daily as needed (nausea). (Patient not taking: Reported on 10/05/2021)     dicyclomine (BENTYL) 20 MG tablet Take I tablet by mouth 4  times daily before meals and at bedtime as needed 360 tablet 3   diltiazem (CARDIZEM CD) 120 MG 24 hr capsule Take 1 capsule (120 mg total) by mouth daily. 90 capsule 3   diphenhydrAMINE HCl, Sleep, 50 MG CAPS Take 50 mg by mouth at bedtime as needed (for sleep).     ferrous sulfate 325 (65 FE) MG tablet Take 1 tablet (325 mg total) by mouth daily with breakfast. 30 tablet 0   fexofenadine (ALLEGRA) 180 MG tablet Take 180 mg by mouth daily as needed for allergies.     fluticasone (FLONASE) 50 MCG/ACT nasal spray Place 2 sprays into the nose daily. (Patient not taking: Reported on 10/05/2021)     hydrochlorothiazide (MICROZIDE) 12.5 MG capsule TAKE 1 CAPSULE BY MOUTH ONCE DAILY . APPOINTMENT REQUIRED FOR FUTURE REFILLS 15 capsule 0   levothyroxine (EUTHYROX) 50 MCG tablet Take 1 tablet (50 mcg total) by mouth daily. 90 tablet 0   losartan (COZAAR) 50 MG tablet TAKE 1 TABLET BY MOUTH ONCE DAILY . APPOINTMENT REQUIRED FOR FUTURE REFILLS 30 tablet 0   mometasone-formoterol (DULERA) 200-5 MCG/ACT AERO Inhale 2 puffs into the lungs 2 (two) times daily. 13 g 6   oxymetazoline (AFRIN) 0.05 % nasal spray Place 1 spray into both nostrils 2 (two) times daily as needed for congestion.     propranolol (INDERAL) 20 MG tablet Take 1 tablet (20 mg total) by mouth 3 (three) times daily.     Sodium Chloride-Sodium Bicarb (AYR SALINE NASAL RINSE NA) Place 1 Dose into the nose daily as needed (congestion).     tadalafil (CIALIS) 10 MG tablet Take 1 tablet (10 mg total) by mouth daily as needed for erectile dysfunction. 10 tablet 0   Tiotropium Bromide Monohydrate (SPIRIVA RESPIMAT) 2.5 MCG/ACT AERS Inhale 2 puffs into the lungs daily. 1 g 5   vitamin B-12 (CYANOCOBALAMIN) 100 MCG tablet Take 100 mcg by mouth daily.     No facility-administered medications prior to visit.    No Known Allergies  ROS Review of Systems    Objective:    Physical Exam  There were no vitals taken for this visit. Wt Readings from  Last 3 Encounters:  09/07/21 277 lb 12.8 oz (126 kg)  09/05/21 278 lb (126.1 kg)  08/17/21 275 lb (124.7 kg)     Health Maintenance Due  Topic Date Due   COVID-19 Vaccine (1) Never done  Zoster Vaccines- Shingrix (1 of 2) Never done    There are no preventive care reminders to display for this patient.  Lab Results  Component Value Date   TSH 2.972 01/06/2021   Lab Results  Component Value Date   WBC 14.8 (H) 08/13/2021   HGB 9.8 (L) 08/13/2021   HCT 32.4 (L) 08/13/2021   MCV 87.6 08/13/2021   PLT 209 08/13/2021   Lab Results  Component Value Date   NA 136 08/13/2021   K 4.6 08/13/2021   CO2 34 (H) 08/13/2021   GLUCOSE 132 (H) 08/13/2021   BUN 19 08/13/2021   CREATININE 0.73 08/13/2021   BILITOT 0.6 08/11/2021   ALKPHOS 57 08/11/2021   AST 28 08/11/2021   ALT 24 08/11/2021   PROT 6.4 (L) 08/11/2021   ALBUMIN 3.8 08/11/2021   CALCIUM 8.9 08/13/2021   ANIONGAP 7 08/13/2021   EGFR 106 03/23/2021   Lab Results  Component Value Date   CHOL 163 08/12/2021   Lab Results  Component Value Date   HDL 46 08/12/2021   Lab Results  Component Value Date   LDLCALC 108 (H) 08/12/2021   Lab Results  Component Value Date   TRIG 45 08/12/2021   Lab Results  Component Value Date   CHOLHDL 3.5 08/12/2021   Lab Results  Component Value Date   HGBA1C 5.6 01/10/2021      Assessment & Plan:   Problem List Items Addressed This Visit   None   No orders of the defined types were placed in this encounter.   Follow-up: No follow-ups on file.    Aron Baba, [place]

## 2021-11-21 ENCOUNTER — Telehealth: Payer: Self-pay | Admitting: Physician Assistant

## 2021-11-21 ENCOUNTER — Other Ambulatory Visit: Payer: Self-pay | Admitting: Nurse Practitioner

## 2021-11-21 DIAGNOSIS — I10 Essential (primary) hypertension: Secondary | ICD-10-CM

## 2021-11-21 MED ORDER — HYDROCHLOROTHIAZIDE 12.5 MG PO CAPS: ORAL_CAPSULE | ORAL | 1 refills | Status: DC

## 2021-11-21 NOTE — Telephone Encounter (Signed)
Patient is aware 

## 2021-11-21 NOTE — Telephone Encounter (Signed)
Patient had appointment for tomorrow 02/23 to get refills of medication however due to the reschedule it has moved him out and he is completely out of his HCTZ. Can you send him in enough to get through until next month?

## 2021-11-21 NOTE — Telephone Encounter (Signed)
Hey. I sent in prescription for him to [facility].

## 2021-11-22 ENCOUNTER — Ambulatory Visit: Payer: BC Managed Care – PPO | Admitting: Physician Assistant

## 2021-12-13 ENCOUNTER — Encounter: Payer: Self-pay | Admitting: Nurse Practitioner

## 2021-12-13 ENCOUNTER — Other Ambulatory Visit: Payer: Self-pay

## 2021-12-13 ENCOUNTER — Ambulatory Visit: Payer: BC Managed Care – PPO | Admitting: Nurse Practitioner

## 2021-12-13 ENCOUNTER — Ambulatory Visit
Admission: RE | Admit: 2021-12-13 | Discharge: 2021-12-13 | Disposition: A | Discharge status: Home / Self Care | Payer: BC Managed Care – PPO | Source: Ambulatory Visit | Attending: Nurse Practitioner | Admitting: Nurse Practitioner

## 2021-12-13 VITALS — BP 132/81 | HR 76 | Temp 98.3°F | Ht 67.5 in | Wt 278.8 lb

## 2021-12-13 DIAGNOSIS — R059 Cough, unspecified: Secondary | ICD-10-CM | POA: Diagnosis not present

## 2021-12-13 DIAGNOSIS — J441 Chronic obstructive pulmonary disease with (acute) exacerbation: Secondary | ICD-10-CM | POA: Diagnosis not present

## 2021-12-13 DIAGNOSIS — R918 Other nonspecific abnormal finding of lung field: Secondary | ICD-10-CM | POA: Diagnosis not present

## 2021-12-13 DIAGNOSIS — N644 Mastodynia: Secondary | ICD-10-CM | POA: Diagnosis not present

## 2021-12-13 DIAGNOSIS — I10 Essential (primary) hypertension: Secondary | ICD-10-CM

## 2021-12-13 DIAGNOSIS — R0602 Shortness of breath: Secondary | ICD-10-CM

## 2021-12-13 DIAGNOSIS — R062 Wheezing: Secondary | ICD-10-CM | POA: Diagnosis not present

## 2021-12-13 MED ORDER — HYDROCHLOROTHIAZIDE 12.5 MG PO CAPS: ORAL_CAPSULE | ORAL | 3 refills | Status: DC

## 2021-12-13 MED ORDER — PREDNISONE 10 MG (21) PO TBPK: ORAL_TABLET | ORAL | 0 refills | Status: DC

## 2021-12-13 MED ORDER — CEFUROXIME AXETIL 500 MG PO TABS: 500.0000 mg | ORAL_TABLET | Freq: Two times a day (BID) | ORAL | 0 refills | Status: DC

## 2021-12-13 MED ORDER — LOSARTAN POTASSIUM 50 MG PO TABS: ORAL_TABLET | ORAL | 3 refills | Status: DC

## 2021-12-13 MED ORDER — IPRATROPIUM-ALBUTEROL 0.5-2.5 (3) MG/3ML IN SOLN: 3.0000 mL | Freq: Four times a day (QID) | RESPIRATORY_TRACT | 3 refills | Status: DC | PRN

## 2021-12-13 NOTE — Progress Notes (Signed)
Established patient visit ? ? ?Patient: Ralph Mack   DOB: 1966-08-24   56 y.o. Male  MRN: [id number] ?Visit Date: 12/13/2021 ? ?Chief Complaint  ?Patient presents with  ? Cough  ? ?Subjective  ?  ?Cough ?Associated symptoms include postnasal drip, rhinorrhea, a sore throat, shortness of breath and wheezing. Pertinent negatives include no chest pain, chills, fever, headaches, myalgias or rash. His past medical history is significant for environmental allergies.   ?The patient states that he has started having cough and nasal congestion. He states that when he coughs, it feels like his chest is being ripped apat. Very painful. He states that he checked his oxygen level this morning, it was 52%. He does have prescription for rescue inhaler. States that he hasn't used it because he doesn't know how he is supposed to use it.  ?Has pain behind his left nipple. This is worse when nipple area is touched, it causes increased pain. States that this has been a problem for him off and on for past three years.  ? ?Medications: ?Outpatient Medications Prior to Visit  ?Medication Sig  ? albuterol (PROAIR HFA) 108 (90 Base) MCG/ACT inhaler Inhale 2 puffs into the lungs every 4 (four) hours as needed for wheezing or shortness of breath.  ? allopurinol (ZYLOPRIM) 300 MG tablet Take 1 tablet by mouth once daily (Patient taking differently: Take 300 mg by mouth daily.)  ? aspirin EC 81 MG tablet Take 1 tablet (81 mg total) by mouth daily. Swallow whole.  ? busPIRone (BUSPAR) 15 MG tablet Take 1 tablet (15 mg total) by mouth in the morning.  ? Cholecalciferol (VITAMIN D3) 5000 units TABS 5,000 IU OTC vitamin D3 daily. (Patient taking differently: Take 5,000 Units by mouth daily.)  ? dicyclomine (BENTYL) 20 MG tablet Take I tablet by mouth 4 times daily before meals and at bedtime as needed  ? diltiazem (CARDIZEM CD) 120 MG 24 hr capsule Take 1 capsule (120 mg total) by mouth daily.  ? diphenhydrAMINE HCl, Sleep, 50 MG CAPS Take 50 mg  by mouth at bedtime as needed (for sleep).  ? fexofenadine (ALLEGRA) 180 MG tablet Take 180 mg by mouth daily as needed for allergies.  ? levothyroxine (EUTHYROX) 50 MCG tablet Take 1 tablet (50 mcg total) by mouth daily.  ? oxymetazoline (AFRIN) 0.05 % nasal spray Place 1 spray into both nostrils 2 (two) times daily as needed for congestion.  ? propranolol (INDERAL) 20 MG tablet Take 1 tablet (20 mg total) by mouth 3 (three) times daily.  ? Sodium Chloride-Sodium Bicarb (AYR SALINE NASAL RINSE NA) Place 1 Dose into the nose daily as needed (congestion).  ? tadalafil (CIALIS) 10 MG tablet Take 1 tablet (10 mg total) by mouth daily as needed for erectile dysfunction.  ? Tiotropium Bromide Monohydrate (SPIRIVA RESPIMAT) 2.5 MCG/ACT AERS Inhale 2 puffs into the lungs daily.  ? vitamin B-12 (CYANOCOBALAMIN) 100 MCG tablet Take 100 mcg by mouth daily.  ? [DISCONTINUED] hydrochlorothiazide (MICROZIDE) 12.5 MG capsule TAKE 1 CAPSULE BY MOUTH ONCE DAILY . APPOINTMENT REQUIRED FOR FUTURE REFILLS  ? [DISCONTINUED] losartan (COZAAR) 50 MG tablet TAKE 1 TABLET BY MOUTH ONCE DAILY . APPOINTMENT REQUIRED FOR FUTURE REFILLS  ? Dextrose-Fructose-Sod Citrate (NAUZENE) [phone] MG CHEW Chew 1 tablet by mouth daily as needed (nausea). (Patient not taking: Reported on 10/05/2021)  ? ferrous sulfate 325 (65 FE) MG tablet Take 1 tablet (325 mg total) by mouth daily with breakfast.  ? fluticasone (FLONASE) 50 MCG/ACT nasal spray Place  2 sprays into the nose daily. (Patient not taking: Reported on 10/05/2021)  ? mometasone-formoterol (DULERA) 200-5 MCG/ACT AERO Inhale 2 puffs into the lungs 2 (two) times daily.  ? ?No facility-administered medications prior to visit.  ? ? ?Review of Systems  ?Constitutional:  Positive for fatigue. Negative for activity change, chills and fever.  ?HENT:  Positive for congestion, postnasal drip, rhinorrhea, sinus pressure, sinus pain and sore throat. Negative for sneezing.   ?Eyes: Negative.   ?Respiratory:   Positive for cough, shortness of breath and wheezing.   ?Cardiovascular:  Negative for chest pain and palpitations.  ?Gastrointestinal:  Negative for constipation, diarrhea, nausea and vomiting.  ?Endocrine: Negative for cold intolerance, heat intolerance, polydipsia and polyuria.  ?Genitourinary:  Negative for dysuria, frequency and urgency.  ?     Tenderness and mild swelling of left nipple.   ?Musculoskeletal:  Negative for back pain and myalgias.  ?Skin:  Negative for rash.  ?Allergic/Immunologic: Positive for environmental allergies.  ?Neurological:  Negative for dizziness, weakness and headaches.  ?Psychiatric/Behavioral:  The patient is not nervous/anxious.   ? ? Objective  ?  ? ?Today's Vitals  ? 12/13/21 1453  ?BP: 132/81  ?Pulse: 76  ?Temp: 98.3 ?F (36.8 ?C)  ?SpO2: 91%  ?Weight: 278 lb 12.8 oz (126.5 kg)  ?Height: 5' 7.5" (1.715 m)  ? ?Body mass index is 43.02 kg/m?.  ? ?Physical Exam ?Vitals and nursing note reviewed.  ?Constitutional:   ?   Appearance: Normal appearance. He is well-developed. He is ill-appearing.  ?HENT:  ?   Head: Normocephalic and atraumatic.  ?   Right Ear: Tympanic membrane is erythematous and bulging.  ?   Left Ear: Tympanic membrane is erythematous and bulging.  ?   Nose: Congestion present.  ?   Mouth/Throat:  ?   Pharynx: Posterior oropharyngeal erythema present.  ?Eyes:  ?   Pupils: Pupils are equal, round, and reactive to light.  ?Cardiovascular:  ?   Rate and Rhythm: Normal rate and regular rhythm.  ?   Pulses: Normal pulses.  ?   Heart sounds: Normal heart sounds.  ?Pulmonary:  ?   Effort: Pulmonary effort is normal.  ?   Breath sounds: Wheezing present.  ?   Comments: Harsh and non-productive cough noted  ?Chest:  ? ? ?Abdominal:  ?   Palpations: Abdomen is soft.  ?Musculoskeletal:     ?   General: Normal range of motion.  ?   Cervical back: Normal range of motion and neck supple.  ?Lymphadenopathy:  ?   Cervical: Cervical adenopathy present.  ?Skin: ?   General: Skin is  warm and dry.  ?   Capillary Refill: Capillary refill takes less than 2 seconds.  ?Neurological:  ?   General: No focal deficit present.  ?   Mental Status: He is alert and oriented to person, place, and time.  ?Psychiatric:     ?   Mood and Affect: Mood normal.     ?   Behavior: Behavior normal.     ?   Thought Content: Thought content normal.     ?   Judgment: Judgment normal.  ?  ? Assessment & Plan  ?  ? ?1. COPD with acute exacerbation ([place]) ?Start ceftin '500mg'$  twice daily for next 7 days. Add DuoNEb solution to be used with home nebulizer machine up to four times daily as needed for wheezing or shortness of breath. Will get chest x-ray for further evaluation of possible pneumonia.  ?-  DG Chest 2 View; Future ?- cefUROXime (CEFTIN) 500 MG tablet; Take 1 tablet (500 mg total) by mouth 2 (two) times daily with a meal.  Dispense: 14 tablet; Refill: 0 ?- ipratropium-albuterol (DUONEB) 0.5-2.5 (3) MG/3ML SOLN; Take 3 mLs by nebulization every 6 (six) hours as needed.  Dispense: 120 mL; Refill: 3 ?- For home use only DME Nebulizer machine ? ?2. Shortness of breath ?Add DuoNEb solution to be used with home nebulizer machine up to four times daily as needed for wheezing or shortness of breath.  ?- ipratropium-albuterol (DUONEB) 0.5-2.5 (3) MG/3ML SOLN; Take 3 mLs by nebulization every 6 (six) hours as needed.  Dispense: 120 mL; Refill: 3 ?- For home use only DME Nebulizer machine ? ?3. HTN (hypertension), malignant ?Blood pressure stable. Continue bp medication as prescribed. Refills provided today.  ?- hydrochlorothiazide (MICROZIDE) 12.5 MG capsule; TAKE 1 CAPSULE BY MOUTH ONCE DAILY . APPOINTMENT REQUIRED FOR FUTURE REFILLS  Dispense: 30 capsule; Refill: 3 ?- losartan (COZAAR) 50 MG tablet; TAKE 1 TABLET BY MOUTH ONCE DAILY . APPOINTMENT REQUIRED FOR FUTURE REFILLS  Dispense: 30 tablet; Refill: 3 ? ?4. Breast tenderness in male ?Breast tenderness with light palpation. Will evaluate further at next evaluation in two  weeks.   ? ?Return in about 2 weeks (around 12/27/2021) for shortness of breath, breast issue, referral to urology .  ?   ? ? ? ?Ronnell Freshwater, NP  ?[facility] at [facility] ?[phone]

## 2021-12-14 ENCOUNTER — Ambulatory Visit: Payer: BC Managed Care – PPO | Admitting: Physician Assistant

## 2021-12-16 NOTE — Progress Notes (Signed)
Please let the patient know that chest x-ray does show developing mild pneumonia. He should continue treatment as prescribed. He should follow up as scheduled. We will set up for repeat chest x-ray at that time. Thanks so much.   -HB

## 2021-12-17 ENCOUNTER — Other Ambulatory Visit: Payer: Self-pay | Admitting: Nurse Practitioner

## 2021-12-17 DIAGNOSIS — J189 Pneumonia, unspecified organism: Secondary | ICD-10-CM

## 2021-12-17 DIAGNOSIS — J441 Chronic obstructive pulmonary disease with (acute) exacerbation: Secondary | ICD-10-CM

## 2021-12-17 MED ORDER — AZITHROMYCIN 250 MG PO TABS: ORAL_TABLET | ORAL | 0 refills | Status: DC

## 2021-12-17 MED ORDER — PREDNISONE 10 MG (21) PO TBPK: ORAL_TABLET | ORAL | 0 refills | Status: DC

## 2021-12-17 NOTE — Progress Notes (Signed)
With chest x-ray showing probable pneumonia, I sent prescription for z-pack and renewed prednisone taper which he should take as directed for 6 days. Both were sent to [facility].

## 2021-12-17 NOTE — Progress Notes (Signed)
With chest x-ray showing probable pneumonia, I sent prescription for z-pack and renewed prednisone taper which he should take as directed for 6 days. Both were sent to [facility].  ?

## 2021-12-23 DIAGNOSIS — N644 Mastodynia: Secondary | ICD-10-CM | POA: Insufficient documentation

## 2021-12-23 DIAGNOSIS — R0602 Shortness of breath: Secondary | ICD-10-CM | POA: Insufficient documentation

## 2021-12-26 ENCOUNTER — Ambulatory Visit (INDEPENDENT_AMBULATORY_CARE_PROVIDER_SITE_OTHER): Payer: BC Managed Care – PPO | Admitting: Nurse Practitioner

## 2021-12-26 ENCOUNTER — Encounter: Payer: Self-pay | Admitting: Nurse Practitioner

## 2021-12-26 VITALS — BP 105/63 | HR 78 | Temp 98.1°F | Ht 67.32 in | Wt 275.0 lb

## 2021-12-26 DIAGNOSIS — J189 Pneumonia, unspecified organism: Secondary | ICD-10-CM

## 2021-12-26 DIAGNOSIS — J441 Chronic obstructive pulmonary disease with (acute) exacerbation: Secondary | ICD-10-CM

## 2021-12-26 DIAGNOSIS — I10 Essential (primary) hypertension: Secondary | ICD-10-CM | POA: Diagnosis not present

## 2021-12-26 DIAGNOSIS — B370 Candidal stomatitis: Secondary | ICD-10-CM | POA: Diagnosis not present

## 2021-12-26 MED ORDER — FLUCONAZOLE 150 MG PO TABS: ORAL_TABLET | ORAL | 0 refills | Status: DC

## 2021-12-26 MED ORDER — PREDNISONE 10 MG (21) PO TBPK: ORAL_TABLET | ORAL | 0 refills | Status: DC

## 2021-12-26 MED ORDER — NYSTATIN 100000 UNIT/ML MT SUSP: OROMUCOSAL | 1 refills | Status: DC

## 2021-12-26 NOTE — Progress Notes (Signed)
Established patient visit ? ? ?Patient: Ralph Mack   DOB: 01-31-1966   56 y.o. Male  MRN: [id number] ?Visit Date: 12/26/2021 ? ? ?Chief Complaint  ?Patient presents with  ? Follow-up  ? ?Subjective  ?  ?HPI  ?The patient is here for follow up.  ?-recently treated for pneumonia. Started with ceftin then did z-pack and prednisone taper.  ?-has cough and congestion.  ?-throat sore and throat and tongue sore.  ?-blood pressure well managed.  ? ? ?Medications: ?Outpatient Medications Prior to Visit  ?Medication Sig  ? albuterol (PROAIR HFA) 108 (90 Base) MCG/ACT inhaler Inhale 2 puffs into the lungs every 4 (four) hours as needed for wheezing or shortness of breath.  ? allopurinol (ZYLOPRIM) 300 MG tablet Take 1 tablet by mouth once daily (Patient taking differently: Take 300 mg by mouth daily.)  ? aspirin EC 81 MG tablet Take 1 tablet (81 mg total) by mouth daily. Swallow whole.  ? azithromycin (ZITHROMAX) 250 MG tablet z-pack - take as directed for 5 days  ? busPIRone (BUSPAR) 15 MG tablet Take 1 tablet (15 mg total) by mouth in the morning.  ? cefUROXime (CEFTIN) 500 MG tablet Take 1 tablet (500 mg total) by mouth 2 (two) times daily with a meal.  ? Cholecalciferol (VITAMIN D3) 5000 units TABS 5,000 IU OTC vitamin D3 daily. (Patient taking differently: Take 5,000 Units by mouth daily.)  ? dicyclomine (BENTYL) 20 MG tablet Take I tablet by mouth 4 times daily before meals and at bedtime as needed  ? diltiazem (CARDIZEM CD) 120 MG 24 hr capsule Take 1 capsule (120 mg total) by mouth daily.  ? diphenhydrAMINE HCl, Sleep, 50 MG CAPS Take 50 mg by mouth at bedtime as needed (for sleep).  ? fexofenadine (ALLEGRA) 180 MG tablet Take 180 mg by mouth daily as needed for allergies.  ? fluticasone (FLONASE) 50 MCG/ACT nasal spray Place 2 sprays into the nose daily.  ? hydrochlorothiazide (MICROZIDE) 12.5 MG capsule TAKE 1 CAPSULE BY MOUTH ONCE DAILY . APPOINTMENT REQUIRED FOR FUTURE REFILLS  ? ipratropium-albuterol (DUONEB)  0.5-2.5 (3) MG/3ML SOLN Take 3 mLs by nebulization every 6 (six) hours as needed.  ? levothyroxine (EUTHYROX) 50 MCG tablet Take 1 tablet (50 mcg total) by mouth daily.  ? losartan (COZAAR) 50 MG tablet TAKE 1 TABLET BY MOUTH ONCE DAILY . APPOINTMENT REQUIRED FOR FUTURE REFILLS  ? oxymetazoline (AFRIN) 0.05 % nasal spray Place 1 spray into both nostrils 2 (two) times daily as needed for congestion.  ? propranolol (INDERAL) 20 MG tablet Take 1 tablet (20 mg total) by mouth 3 (three) times daily.  ? Sodium Chloride-Sodium Bicarb (AYR SALINE NASAL RINSE NA) Place 1 Dose into the nose daily as needed (congestion).  ? tadalafil (CIALIS) 10 MG tablet Take 1 tablet (10 mg total) by mouth daily as needed for erectile dysfunction.  ? Tiotropium Bromide Monohydrate (SPIRIVA RESPIMAT) 2.5 MCG/ACT AERS Inhale 2 puffs into the lungs daily.  ? vitamin B-12 (CYANOCOBALAMIN) 100 MCG tablet Take 100 mcg by mouth daily.  ? [DISCONTINUED] predniSONE (STERAPRED UNI-PAK 21 TAB) 10 MG (21) TBPK tablet 6 day taper - take by mouth as directed for 6 days  ? ferrous sulfate 325 (65 FE) MG tablet Take 1 tablet (325 mg total) by mouth daily with breakfast.  ? mometasone-formoterol (DULERA) 200-5 MCG/ACT AERO Inhale 2 puffs into the lungs 2 (two) times daily.  ? [DISCONTINUED] Dextrose-Fructose-Sod Citrate (NAUZENE) [phone] MG CHEW Chew 1 tablet by mouth daily as needed (  nausea). (Patient not taking: Reported on 10/05/2021)  ? ?No facility-administered medications prior to visit.  ? ? ?Review of Systems  ?Constitutional:  Positive for fatigue. Negative for activity change, chills and fever.  ?HENT:  Positive for congestion, rhinorrhea and sore throat. Negative for postnasal drip, sinus pressure, sinus pain and sneezing.   ?Eyes: Negative.   ?Respiratory:  Positive for cough. Negative for shortness of breath and wheezing.   ?Cardiovascular:  Negative for chest pain and palpitations.  ?Gastrointestinal:  Negative for constipation, diarrhea,  nausea and vomiting.  ?Endocrine: Negative for cold intolerance, heat intolerance, polydipsia and polyuria.  ?Genitourinary:  Negative for dysuria, frequency and urgency.  ?Musculoskeletal:  Negative for back pain and myalgias.  ?Skin:  Negative for rash.  ?Allergic/Immunologic: Negative for environmental allergies.  ?Neurological:  Negative for dizziness, weakness and headaches.  ?Hematological:  Positive for adenopathy.  ?Psychiatric/Behavioral:  The patient is not nervous/anxious.   ? ? ? ? Objective  ?  ? ?Today's Vitals  ? 12/26/21 1312  ?BP: 105/63  ?Pulse: 78  ?Temp: 98.1 ?F (36.7 ?C)  ?SpO2: 91%  ?Weight: 275 lb (124.7 kg)  ?Height: 5' 7.32" (1.71 m)  ? ?Body mass index is 42.66 kg/m?.  ? ? ?BP Readings from Last 3 Encounters:  ?12/26/21 105/63  ?12/13/21 132/81  ?09/07/21 124/80  ?  ?Wt Readings from Last 3 Encounters:  ?12/26/21 275 lb (124.7 kg)  ?12/13/21 278 lb 12.8 oz (126.5 kg)  ?09/07/21 277 lb 12.8 oz (126 kg)  ?  ?Physical Exam ?Vitals and nursing note reviewed.  ?Constitutional:   ?   Appearance: Normal appearance. He is well-developed.  ?HENT:  ?   Head: Normocephalic.  ?Eyes:  ?   Pupils: Pupils are equal, round, and reactive to light.  ?Cardiovascular:  ?   Rate and Rhythm: Normal rate and regular rhythm.  ?   Pulses: Normal pulses.  ?   Heart sounds: Normal heart sounds.  ?Pulmonary:  ?   Effort: Pulmonary effort is normal.  ?   Breath sounds: Normal breath sounds.  ?Abdominal:  ?   Palpations: Abdomen is soft.  ?Musculoskeletal:     ?   General: Normal range of motion.  ?   Cervical back: Normal range of motion and neck supple.  ?Lymphadenopathy:  ?   Cervical: No cervical adenopathy.  ?Skin: ?   General: Skin is warm and dry.  ?   Capillary Refill: Capillary refill takes less than 2 seconds.  ?Neurological:  ?   General: No focal deficit present.  ?   Mental Status: He is alert and oriented to person, place, and time.  ?Psychiatric:     ?   Mood and Affect: Mood normal.     ?   Behavior:  Behavior normal.     ?   Thought Content: Thought content normal.     ?   Judgment: Judgment normal.  ?  ? ? Assessment & Plan  ?  ?1. Community acquired pneumonia, unspecified laterality ?Recent chest x-ray showed vague opacities at the bilateral lung bases, concerning for multifocal infection.  Patient treated initially with Ceftin then Z-Pak.  Patient does have some congestion and mild cough.  Lung sounds are clear and equal throughout.  New chest x-ray should base set up in the near future to ensure clearance of infection. ? ?2. COPD with acute exacerbation ([place]) ?We will repeat prednisone taper.  He should take as directed for 6 days.  Continue use of inhaler as  needed and as prescribed. ?- predniSONE (STERAPRED UNI-PAK 21 TAB) 10 MG (21) TBPK tablet; 6 day taper - take by mouth as directed for 6 days  Dispense: 21 tablet; Refill: 0 ? ?3. Oropharyngeal candidiasis ?Start Diflucan 150 mg 1 time.  May repeat after 3 days for persistent symptoms.  We will also start nystatin suspension.  Patient swish and swallow with 5 mils 4 times daily for next 7 days. ?- nystatin (MYCOSTATIN) 100000 UNIT/ML suspension; Swish and swallow  with 96ms QID for 7 days  Dispense: 200 mL; Refill: 1 ?- fluconazole (DIFLUCAN) 150 MG tablet; Take 1 tablet po once. May repeat dose in 3 days as needed for persistent symptoms.  Dispense: 3 tablet; Refill: 0 ? ?4. Essential hypertension ?Stable.  Continue blood pressure medication as prescribed. ? ? ?Problem List Items Addressed This Visit   ? ?  ? Cardiovascular and Mediastinum  ? Essential hypertension  ?  ? Respiratory  ? [place]ation [place]  ? Relevant Medications  ? predniSONE (STERAPRED UNI-PAK 21 TAB) 10 MG (21) TBPK tablet  ? Community acquired pneumonia - Primary  ? Relevant Medications  ? fluconazole (DIFLUCAN) 150 MG tablet  ?  ? Digestive  ? Oropharyngeal candidiasis  ? Relevant Medications  ? nystatin (MYCOSTATIN) 100000 UNIT/ML suspension  ? fluconazole  (DIFLUCAN) 150 MG tablet  ?  ? ?Return in about 3 months (around 03/28/2022) for health maintenance exam.  ?   ? ? ? ? ?HRonnell Freshwater NP  ?[facility]?[phone](phone) ?[phone]

## 2021-12-30 DIAGNOSIS — J189 Pneumonia, unspecified organism: Secondary | ICD-10-CM | POA: Insufficient documentation

## 2021-12-30 DIAGNOSIS — B370 Candidal stomatitis: Secondary | ICD-10-CM | POA: Insufficient documentation

## 2022-01-01 ENCOUNTER — Other Ambulatory Visit: Payer: Self-pay | Admitting: Physician Assistant

## 2022-01-01 DIAGNOSIS — M1000 Idiopathic gout, unspecified site: Secondary | ICD-10-CM

## 2022-01-03 ENCOUNTER — Other Ambulatory Visit: Payer: Self-pay | Admitting: Cardiology

## 2022-01-28 ENCOUNTER — Encounter: Payer: Self-pay | Admitting: Internal Medicine

## 2022-01-29 NOTE — Progress Notes (Unsigned)
I called [employer] and spoke with Seth Bake.  [phone] option 2. I asked her to please email me a copy of his DL. Will await this and then send msg to Dr Annamaria Boots.  ?

## 2022-01-29 NOTE — Telephone Encounter (Signed)
Dr Annamaria Boots, please advise on pt email: ? ?I was wondering if i could a prescription sent to [facility] at [facility], my concern is the pressure! I think it needs to be adjusted, i am not getting the benefits i need at this time, i feel sleepy and ready for a nap less then 3hrs after waking, i talked to [place], but she said Dr.Young would have to evaluate and prescribe ? ?I had [employer] send [place] a DL and have placed by your desk in C pod for review. Thanks! ?

## 2022-01-30 ENCOUNTER — Other Ambulatory Visit: Payer: Self-pay | Admitting: Adult Health

## 2022-01-30 NOTE — Telephone Encounter (Signed)
Deneise Lever, MD ?to Rosana Berger, CMA   ?   3:04 PM ?The CPAP download shows good compliance and control. I don't think it needs adjustment.  ?I recommend we bring Mr Legler in for an office visit with me- held spot ok. We may need to work to improve night time sleep and daytime alertness.  ?-CDY  ? ? ? ?Tried to call the pt to get him scheduled for an appt but unable to reach. Left message for him to return call. ? ? ?When pt calls back, please schedule a visit with Dr. Annamaria Boots. Dr. Annamaria Boots said it is okay to use held slot. ? ?Routing this to front desk pool to try to help get pt scheduled for appt. ?

## 2022-02-01 NOTE — Telephone Encounter (Signed)
Called patient to schedule office visit- patient states it would be a waste of time and money to come in because he can't say anything in the office visit that he didn't already say. Patient is still restless and needs something changed with his machine. Patient did share that he has an over-active bladder and gets up multiple times during the night.  ? ?Please advise. ?

## 2022-02-06 ENCOUNTER — Other Ambulatory Visit: Payer: Self-pay | Admitting: Cardiology

## 2022-03-05 ENCOUNTER — Other Ambulatory Visit: Payer: Self-pay | Admitting: Cardiology

## 2022-03-14 ENCOUNTER — Other Ambulatory Visit: Payer: Self-pay | Admitting: Physician Assistant

## 2022-03-20 ENCOUNTER — Other Ambulatory Visit: Payer: Self-pay | Admitting: Nurse Practitioner

## 2022-03-20 DIAGNOSIS — M1000 Idiopathic gout, unspecified site: Secondary | ICD-10-CM

## 2022-03-23 ENCOUNTER — Other Ambulatory Visit: Payer: Self-pay | Admitting: Physician Assistant

## 2022-03-23 DIAGNOSIS — E039 Hypothyroidism, unspecified: Secondary | ICD-10-CM

## 2022-04-02 NOTE — Progress Notes (Unsigned)
Cardiology Office Note   Date:  04/03/2022   ID:  Ralph Mack, DOB [birth year], MRN [id number]  PCP:  Ronnell Freshwater, NP  Cardiologist:   Minus Breeding, MD   Chief Complaint  Patient presents with   Atrial Fibrillation      History of Present Illness: Ralph Mack is a 56 y.o. male who presents for evaluation of HTN and minor coronary disease at catheterization in 2009 when he had a heart catheterization in [place].  He did have a negative treadmill in January 2020.  In No 2022 he was admitted with acute respiratory failure.   In April 2022 he had pneumonia.  He was quite ill and hypoxemic.  Echocardiogram demonstrated no significant abnormality.  He was treated with steroids as well as antibiotics.  He did have paroxysmal atrial flutter.   He was in the hospital again with COPD flare in November.   He is status post atrial flutter ablation in July 2022.  He has a loop recorder in place.   He has had no atrial fib on monitor.    The patient denies any new symptoms such as chest discomfort, neck or arm discomfort. There has been no new shortness of breath, PND or orthopnea. There have been no reported palpitations, presyncope or syncope.    Past Medical History:  Diagnosis Date   Acute on chronic respiratory failure ([place]) 01/06/2021   Walk Test 02/20/21- desat to 85% during second lap with HR 90. Improved to 95% on 2L. Patient denied shortness of breath during walk.   Allergy    Arthritis    COPD (chronic obstructive pulmonary disease) (HCC)    Hx of colonic polyp 11/13/2016   10/2016 7 mm ssp/a - recall 2023   Hypertension    Hypothyroidism    IBS (irritable bowel syndrome)    Lymphocytic colitis 04/19/2019   Mild CAD 2009   Obesity    OSA (obstructive sleep apnea) 04/20/2021   Paroxysmal atrial flutter (HCC)    Prediabetes    Recurrent upper respiratory infection (URI)     Past Surgical History:  Procedure Laterality Date   A-FLUTTER ABLATION N/A 04/06/2021    Procedure: A-FLUTTER ABLATION;  Surgeon: Vickie Epley, MD;  Location: [facility];  Service: Cardiovascular;  Laterality: N/A;   CARDIAC ELECTROPHYSIOLOGY STUDY AND ABLATION     COLONOSCOPY  2018   CORONARY ANGIOGRAM  2009     Current Outpatient Medications  Medication Sig Dispense Refill   albuterol (PROAIR HFA) 108 (90 Base) MCG/ACT inhaler Inhale 2 puffs into the lungs every 4 (four) hours as needed for wheezing or shortness of breath. 18 g 3   allopurinol (ZYLOPRIM) 300 MG tablet Take 1 tablet by mouth once daily 90 tablet 0   busPIRone (BUSPAR) 15 MG tablet Take 1 tablet (15 mg total) by mouth in the morning. 90 tablet 2   Cholecalciferol (VITAMIN D3) 5000 units TABS 5,000 IU OTC vitamin D3 daily. (Patient taking differently: Take 5,000 Units by mouth daily.) 90 tablet 3   dicyclomine (BENTYL) 20 MG tablet Take I tablet by mouth 4 times daily before meals and at bedtime as needed 360 tablet 3   diltiazem (CARDIZEM CD) 120 MG 24 hr capsule Take 1 capsule by mouth once daily 90 capsule 0   diphenhydrAMINE HCl, Sleep, 50 MG CAPS Take 50 mg by mouth at bedtime as needed (for sleep).     fexofenadine (ALLEGRA) 180 MG tablet Take 180 mg by mouth  daily as needed for allergies.     fluticasone (FLONASE) 50 MCG/ACT nasal spray Place 2 sprays into the nose daily.     hydrochlorothiazide (MICROZIDE) 12.5 MG capsule TAKE 1 CAPSULE BY MOUTH ONCE DAILY . APPOINTMENT REQUIRED FOR FUTURE REFILLS 30 capsule 3   levothyroxine (SYNTHROID) 50 MCG tablet Take 1 tablet by mouth once daily 90 tablet 0   losartan (COZAAR) 50 MG tablet TAKE 1 TABLET BY MOUTH ONCE DAILY. APPOINTMENT REQUIRED FOR FUTURE REFILLS 30 tablet 0   mometasone-formoterol (DULERA) 200-5 MCG/ACT AERO Inhale 2 puffs into the lungs 2 (two) times daily. 13 g 6   oxymetazoline (AFRIN) 0.05 % nasal spray Place 1 spray into both nostrils 2 (two) times daily as needed for congestion.     propranolol (INDERAL) 20 MG tablet Take 1 tablet  (20 mg total) by mouth 3 (three) times daily.     tadalafil (CIALIS) 10 MG tablet TAKE 1 TABLET BY MOUTH ONCE DAILY AS NEEDED FOR ERECTILE DYSFUNCTION 10 tablet 0   aspirin EC 81 MG tablet Take 1 tablet (81 mg total) by mouth daily. Swallow whole. (Patient not taking: Reported on 04/03/2022) 90 tablet 3   azithromycin (ZITHROMAX) 250 MG tablet z-pack - take as directed for 5 days (Patient not taking: Reported on 04/03/2022) 6 tablet 0   cefUROXime (CEFTIN) 500 MG tablet Take 1 tablet (500 mg total) by mouth 2 (two) times daily with a meal. 14 tablet 0   ferrous sulfate 325 (65 FE) MG tablet Take 1 tablet (325 mg total) by mouth daily with breakfast. (Patient not taking: Reported on 04/03/2022) 30 tablet 0   fluconazole (DIFLUCAN) 150 MG tablet Take 1 tablet po once. May repeat dose in 3 days as needed for persistent symptoms. (Patient not taking: Reported on 04/03/2022) 3 tablet 0   ipratropium-albuterol (DUONEB) 0.5-2.5 (3) MG/3ML SOLN Take 3 mLs by nebulization every 6 (six) hours as needed. (Patient not taking: Reported on 04/03/2022) 120 mL 3   nystatin (MYCOSTATIN) 100000 UNIT/ML suspension Swish and swallow  with 53ms QID for 7 days (Patient not taking: Reported on 04/03/2022) 200 mL 1   predniSONE (STERAPRED UNI-PAK 21 TAB) 10 MG (21) TBPK tablet 6 day taper - take by mouth as directed for 6 days (Patient not taking: Reported on 04/03/2022) 21 tablet 0   Sodium Chloride-Sodium Bicarb (AYR SALINE NASAL RINSE NA) Place 1 Dose into the nose daily as needed (congestion). (Patient not taking: Reported on 04/03/2022)     Tiotropium Bromide Monohydrate (SPIRIVA RESPIMAT) 2.5 MCG/ACT AERS INHALE 2 SPRAY(S) BY MOUTH ONCE DAILY (Patient not taking: Reported on 04/03/2022) 4 g 5   vitamin B-12 (CYANOCOBALAMIN) 100 MCG tablet Take 100 mcg by mouth daily. (Patient not taking: Reported on 04/03/2022)     No current facility-administered medications for this visit.    Allergies:   Patient has no known allergies.    ROS:   Please see the history of present illness.   Otherwise, review of systems are positive for right flank pain, some difficulty urinating.   All other systems are reviewed and negative.    PHYSICAL EXAM: VS:  BP 110/64 (BP Location: Left Arm, Patient Position: Sitting, Cuff Size: Large)   Pulse 61   Ht 5' 7.5" (1.715 m)   Wt 274 lb 12.8 oz (124.6 kg)   SpO2 90%   BMI 42.40 kg/m  , BMI Body mass index is 42.4 kg/m. GENERAL:  Well appearing NECK:  No jugular venous distention, waveform within  normal limits, carotid upstroke brisk and symmetric, no bruits, no thyromegaly LUNGS:  Clear to auscultation bilaterally CHEST:  Unremarkable HEART:  PMI not displaced or sustained,S1 and S2 within normal limits, no S3, no S4, no clicks, no rubs, none murmurs ABD:  Flat, positive bowel sounds normal in frequency in pitch, no bruits, no rebound, no guarding, no midline pulsatile mass, no hepatomegaly, no splenomegaly EXT:  2 plus pulses throughout, no edema, no cyanosis no clubbing  EKG:  EKG is not ordered today.    Recent Labs: 08/11/2021: ALT 24 08/13/2021: BUN 19; Creatinine, Ser 0.73; Hemoglobin 9.8; Magnesium 2.4; Platelets 209; Potassium 4.6; Sodium 136    Lipid Panel    Component Value Date/Time   CHOL 163 08/12/2021 0530   CHOL 190 08/07/2020 1100   TRIG 45 08/12/2021 0530   HDL 46 08/12/2021 0530   HDL 59 08/07/2020 1100   CHOLHDL 3.5 08/12/2021 0530   VLDL 9 08/12/2021 0530   LDLCALC 108 (H) 08/12/2021 0530   LDLCALC 94 08/07/2020 1100      Wt Readings from Last 3 Encounters:  04/03/22 274 lb 12.8 oz (124.6 kg)  12/26/21 275 lb (124.7 kg)  12/13/21 278 lb 12.8 oz (126.5 kg)      Other studies Reviewed: Additional studies/ records that were reviewed today include: EP records, labs Review of the above records demonstrates:  Please see elsewhere in the note.     ASSESSMENT AND PLAN:   ATRIAL FLUTTER: It looks like this was typical and isolated atrial flutter.  Ralph Mack has a CHA2DS2 - VASc score of 2.   He is status post ablation.  We did interrogate his device today and I am waiting on the results.  He is off anticoagulation.  No change in therapy.   HTN: The blood pressure is controlled.  Continue the meds as listed.   CAD:   He had nonobstructive plaque.  No change in therapy.  ALCOHOL USE: Still using some alcohol.  We talked about this.   TOBACCO ABUSE: He is no longer smoking  OBESITY: We have discussed diet and exercise  LUNG NODULES: He will get a noncontrasted CT of his chest in July.  I did look at the CT angiogram done in November and they did not mention these but they were clearly mentioned in June of last year.  He has risk factors and so he will receive follow-up.  Current  testmedicines are reviewed at length with the patient today.  The patient does not have concerns regarding medicines.  The following changes have been made:  None  Labs/ tests ordered today include:   Orders Placed This Encounter  Procedures   CT CHEST WO CONTRAST     Disposition:   FU with me in 12 months.    Signed, Minus Breeding, MD  04/03/2022 12:54 PM    [facility]

## 2022-04-03 ENCOUNTER — Encounter: Payer: Self-pay | Admitting: Cardiology

## 2022-04-03 ENCOUNTER — Other Ambulatory Visit: Payer: Self-pay | Admitting: Nurse Practitioner

## 2022-04-03 ENCOUNTER — Encounter: Payer: Self-pay | Admitting: Nurse Practitioner

## 2022-04-03 ENCOUNTER — Ambulatory Visit: Payer: BC Managed Care – PPO | Admitting: Cardiology

## 2022-04-03 VITALS — BP 110/64 | HR 61 | Ht 67.5 in | Wt 274.8 lb

## 2022-04-03 DIAGNOSIS — R350 Frequency of micturition: Secondary | ICD-10-CM

## 2022-04-03 DIAGNOSIS — R5383 Other fatigue: Secondary | ICD-10-CM

## 2022-04-03 DIAGNOSIS — I10 Essential (primary) hypertension: Secondary | ICD-10-CM

## 2022-04-03 DIAGNOSIS — R7303 Prediabetes: Secondary | ICD-10-CM

## 2022-04-03 DIAGNOSIS — R911 Solitary pulmonary nodule: Secondary | ICD-10-CM

## 2022-04-03 DIAGNOSIS — I483 Typical atrial flutter: Secondary | ICD-10-CM

## 2022-04-03 DIAGNOSIS — E039 Hypothyroidism, unspecified: Secondary | ICD-10-CM

## 2022-04-03 DIAGNOSIS — Z0000 Encounter for general adult medical examination without abnormal findings: Secondary | ICD-10-CM

## 2022-04-03 NOTE — Patient Instructions (Addendum)
Medication Instructions:  Your Physician recommend you continue on your current medication as directed.    *If you need a refill on your cardiac medications before your next appointment, please call your pharmacy*   Lab Work: None ordered today   Testing/Procedures: Chest CT scanning, (CAT scanning), is a noninvasive, special x-ray that produces cross-sectional images of the body using x-rays and a computer. CT scans help physicians diagnose and treat medical conditions. For some CT exams, a contrast material is used to enhance visibility in the area of the body being studied. CT scans provide greater clarity and reveal more details than regular x-ray exams.    Follow-Up: At [facility], you and your health needs are our priority.  As part of our continuing mission to provide you with exceptional heart care, we have created designated Provider Care Teams.  These Care Teams include your primary Cardiologist (physician) and Advanced Practice Providers (APPs -  Physician Assistants and Nurse Practitioners) who all work together to provide you with the care you need, when you need it.  We recommend signing up for the patient portal called "MyChart".  Sign up information is provided on this After Visit Summary.  MyChart is used to connect with patients for Virtual Visits (Telemedicine).  Patients are able to view lab/test results, encounter notes, upcoming appointments, etc.  Non-urgent messages can be sent to your provider as well.   To learn more about what you can do with MyChart, go to [email or web address].    Your next appointment:   1 year(s)  The format for your next appointment:   In Person  Provider:   Minus Breeding, MD {

## 2022-04-03 NOTE — Progress Notes (Signed)
Routine, fasting labs ordered. Included PSA, magnesium, phosphorus, B12, and folate, and HgbA1c

## 2022-04-04 ENCOUNTER — Ambulatory Visit: Payer: BC Managed Care – PPO | Admitting: Psychiatry

## 2022-04-09 NOTE — Progress Notes (Deleted)
Complete physical exam   Patient: Ralph Mack   DOB: 1966/06/05   56 y.o. Male  MRN: [id number] Visit Date: 04/10/2022    No chief complaint on file.  Subjective    Ralph Mack is a 56 y.o. male who presents today for a complete physical exam.  He reports consuming a {diet types:17450} diet. {Exercise:19826} He generally feels {well/fairly well/poorly:18703}. He {does/does not:200015} have additional problems to discuss today.  HPI  Annual physical.  -labs have been ordered. Patient had asked to have done prior to visit. Has not had done yet.   Past Medical History:  Diagnosis Date   Acute on chronic respiratory failure ([place]) 01/06/2021   Walk Test 02/20/21- desat to 85% during second lap with HR 90. Improved to 95% on 2L. Patient denied shortness of breath during walk.   Allergy    Arthritis    COPD (chronic obstructive pulmonary disease) (HCC)    Hx of colonic polyp 11/13/2016   10/2016 7 mm ssp/a - recall 2023   Hypertension    Hypothyroidism    IBS (irritable bowel syndrome)    Lymphocytic colitis 04/19/2019   Mild CAD 2009   Obesity    OSA (obstructive sleep apnea) 04/20/2021   Paroxysmal atrial flutter (HCC)    Prediabetes    Recurrent upper respiratory infection (URI)    Past Surgical History:  Procedure Laterality Date   A-FLUTTER ABLATION N/A 04/06/2021   Procedure: A-FLUTTER ABLATION;  Surgeon: Vickie Epley, MD;  Location: [facility];  Service: Cardiovascular;  Laterality: N/A;   CARDIAC ELECTROPHYSIOLOGY STUDY AND ABLATION     COLONOSCOPY  2018   CORONARY ANGIOGRAM  2009   Social History   Socioeconomic History   Marital status: Married    Spouse name: Not on file   Number of children: 2   Years of education: Not on file   Highest education level: Not on file  Occupational History   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     Social Determinants of Health   Financial Resource Strain: Not on file  Food Insecurity: Not on file  Transportation Needs: Not on file  Physical Activity: Not on file  Stress: Not on file  Social Connections: Not on file  Intimate Partner Violence: Not on file   Family Status  Relation Name Status   Mother  Deceased   Father  Deceased   Daughter  Alive   Son  Alive   MGM  Deceased   MGF  Deceased   PGM  Deceased   PGF  Deceased   Neg Hx  (Not Specified)   Family History  Problem Relation Age of Onset   Asthma Mother    COPD Mother    Cancer Mother  Skin   Cancer - Other Mother        bone, liver, breast, lung   Emphysema Mother    Heart disease Father        Pacemaker   Parkinson's disease Father    Depression Father    Depression Daughter    OCD Daughter    Alcohol abuse Maternal Grandmother    Schizophrenia Maternal Grandmother    Diabetes Paternal Grandfather    Kidney disease Paternal Grandfather    Colon polyps Neg Hx    Colon cancer Neg Hx    Esophageal cancer Neg Hx    Stomach cancer Neg Hx    Rectal cancer Neg Hx    No Known Allergies  Patient Care Team: Ronnell Freshwater, NP as PCP - General (Family Medicine) Minus Breeding, MD as PCP - Cardiology (Cardiology) Vickie Epley, MD as PCP - Electrophysiology (Cardiology) Melida Quitter, MD as Consulting Physician (Otolaryngology) Kennith Gain, MD as Consulting Physician (Allergy) Gatha Mayer, MD  as Consulting Physician (Gastroenterology) Cottle, Billey Co., MD as Attending Physician (Psychiatry)   Medications: Outpatient Medications Prior to Visit  Medication Sig   albuterol (PROAIR HFA) 108 (90 Base) MCG/ACT inhaler Inhale 2 puffs into the lungs every 4 (four) hours as needed for wheezing or shortness of breath.   allopurinol (ZYLOPRIM) 300 MG tablet Take 1 tablet by mouth once daily   aspirin EC 81 MG tablet Take 1 tablet (81 mg total) by mouth daily. Swallow whole. (Patient not taking: Reported on 04/03/2022)   azithromycin (ZITHROMAX) 250 MG tablet z-pack - take as directed for 5 days (Patient not taking: Reported on 04/03/2022)   busPIRone (BUSPAR) 15 MG tablet Take 1 tablet (15 mg total) by mouth in the morning.   cefUROXime (CEFTIN) 500 MG tablet Take 1 tablet (500 mg total) by mouth 2 (two) times daily with a meal.   Cholecalciferol (VITAMIN D3) 5000 units TABS 5,000 IU OTC vitamin D3 daily. (Patient taking differently: Take 5,000 Units by mouth daily.)   dicyclomine (BENTYL) 20 MG tablet Take I tablet by mouth 4 times daily before meals and at bedtime as needed   diltiazem (CARDIZEM CD) 120 MG 24 hr capsule Take 1 capsule by mouth once daily   diphenhydrAMINE HCl, Sleep, 50 MG CAPS Take 50 mg by mouth at bedtime as needed (for sleep).   ferrous sulfate 325 (65 FE) MG tablet Take 1 tablet (325 mg total) by mouth daily with breakfast. (Patient not taking: Reported on 04/03/2022)   fexofenadine (ALLEGRA) 180 MG tablet Take 180 mg by mouth daily as needed for allergies.   fluconazole (DIFLUCAN) 150 MG tablet Take 1 tablet po once. May repeat dose in 3 days as needed for persistent symptoms. (Patient not taking: Reported on 04/03/2022)   fluticasone (FLONASE) 50 MCG/ACT nasal spray Place 2 sprays into the nose daily.   hydrochlorothiazide (MICROZIDE) 12.5 MG capsule TAKE 1 CAPSULE BY MOUTH ONCE DAILY . APPOINTMENT REQUIRED FOR FUTURE REFILLS   ipratropium-albuterol (DUONEB) 0.5-2.5 (3)  MG/3ML SOLN Take 3 mLs by nebulization every 6 (six) hours as needed. (Patient not taking: Reported on 04/03/2022)   levothyroxine (SYNTHROID) 50 MCG tablet Take 1 tablet by mouth once daily   losartan (COZAAR) 50 MG tablet TAKE 1 TABLET BY MOUTH ONCE DAILY. APPOINTMENT REQUIRED FOR FUTURE REFILLS   mometasone-formoterol (DULERA) 200-5 MCG/ACT AERO Inhale 2 puffs into the lungs 2 (two) times daily.   nystatin (MYCOSTATIN) 100000 UNIT/ML suspension Swish and swallow  with 14ms QID for 7 days (Patient not taking: Reported on 04/03/2022)   oxymetazoline (AFRIN) 0.05 % nasal spray Place 1 spray into both nostrils 2 (two) times daily as needed for congestion.   predniSONE (STERAPRED UNI-PAK 21 TAB) 10 MG (21) TBPK tablet 6 day taper - take by mouth as directed for 6 days (Patient not taking: Reported on 04/03/2022)   propranolol (INDERAL) 20 MG tablet Take 1 tablet (20 mg total) by mouth 3 (three) times daily.   Sodium Chloride-Sodium Bicarb (AYR SALINE NASAL RINSE NA) Place 1 Dose into the nose daily as needed (congestion). (Patient not taking: Reported on 04/03/2022)   tadalafil (CIALIS) 10 MG tablet TAKE 1 TABLET BY MOUTH ONCE DAILY AS NEEDED FOR ERECTILE DYSFUNCTION   Tiotropium Bromide Monohydrate (SPIRIVA RESPIMAT) 2.5 MCG/ACT AERS INHALE 2 SPRAY(S) BY MOUTH ONCE DAILY (Patient not taking: Reported on 04/03/2022)   vitamin B-12 (CYANOCOBALAMIN) 100 MCG tablet Take 100 mcg by mouth daily. (Patient not taking: Reported on 04/03/2022)   No facility-administered medications prior to visit.    Review of Systems  {Labs (Optional):23779}   Objective    There were no vitals taken for this visit. BP Readings from Last 3 Encounters:  04/03/22 110/64  12/26/21 105/63  12/13/21 132/81    Wt Readings from Last 3 Encounters:  04/03/22 274 lb 12.8 oz (124.6 kg)  12/26/21 275 lb (124.7 kg)  12/13/21 278 lb 12.8 oz (126.5 kg)     Physical Exam  ***  Last depression screening scores    12/26/2021    1:14  PM 04/24/2021    9:47 AM 01/30/2021   11:09 AM  PHQ 2/9 Scores  PHQ - 2 Score 0 0 0  PHQ- 9 Score 1 0 0   Last fall risk screening    04/24/2021    9:47 AM  Fall Risk   Falls in the past year? 0  Number falls in past yr: 0  Injury with Fall? 0  Risk for fall due to : No Fall Risks  Follow up Falls evaluation completed   Last Audit-C alcohol use screening     No data to display         A score of 3 or more in women, and 4 or more in men indicates increased risk for alcohol abuse, EXCEPT if all of the points are from question 1   No results found for any visits on 04/10/22.  Assessment & Plan    Routine Health Maintenance and Physical Exam  Exercise Activities and Dietary recommendations  Goals   None     Immunization History  Administered Date(s) Administered   Influenza,inj,Quad PF,6+ Mos 07/26/2015    Health Maintenance  Topic Date Due   COVID-19 Vaccine (1) Never done   TETANUS/TDAP  Never done   Zoster Vaccines- Shingrix (1 of 2) Never done   INFLUENZA VACCINE  04/30/2022   COLONOSCOPY (Pts [id number] coverage will need to be confirmed)  04/08/2024   Hepatitis C Screening  Completed   HIV Screening  Completed   HPV VACCINES  Aged Out    Discussed health benefits of physical activity, and encouraged him to engage in regular exercise appropriate for his age and condition.  Problem List Items Addressed This Visit   None    No follow-ups on file.        HeRonnell FreshwaterNP  [phone]) [phone])  [facility]

## 2022-04-10 ENCOUNTER — Encounter: Payer: BC Managed Care – PPO | Admitting: Nurse Practitioner

## 2022-04-10 ENCOUNTER — Encounter: Payer: Self-pay | Admitting: Cardiology

## 2022-04-10 ENCOUNTER — Other Ambulatory Visit: Payer: BC Managed Care – PPO

## 2022-04-10 DIAGNOSIS — I10 Essential (primary) hypertension: Secondary | ICD-10-CM | POA: Diagnosis not present

## 2022-04-10 DIAGNOSIS — E039 Hypothyroidism, unspecified: Secondary | ICD-10-CM | POA: Diagnosis not present

## 2022-04-10 DIAGNOSIS — R7303 Prediabetes: Secondary | ICD-10-CM | POA: Diagnosis not present

## 2022-04-10 DIAGNOSIS — R350 Frequency of micturition: Secondary | ICD-10-CM | POA: Diagnosis not present

## 2022-04-10 DIAGNOSIS — Z0000 Encounter for general adult medical examination without abnormal findings: Secondary | ICD-10-CM

## 2022-04-10 DIAGNOSIS — R5383 Other fatigue: Secondary | ICD-10-CM | POA: Diagnosis not present

## 2022-04-11 ENCOUNTER — Other Ambulatory Visit: Payer: Self-pay | Admitting: Physician Assistant

## 2022-04-11 ENCOUNTER — Ambulatory Visit (HOSPITAL_COMMUNITY): Payer: BC Managed Care – PPO

## 2022-04-11 ENCOUNTER — Encounter (HOSPITAL_COMMUNITY): Payer: Self-pay

## 2022-04-11 LAB — CBC
Hematocrit: 46.5 % (ref 37.5–51.0)
Hemoglobin: 15.5 g/dL (ref 13.0–17.7)
MCH: 32.3 pg (ref 26.6–33.0)
MCHC: 33.3 g/dL (ref 31.5–35.7)
MCV: 97 fL (ref 79–97)
Platelets: 179 10*3/uL (ref 150–450)
RBC: 4.8 x10E6/uL (ref 4.14–5.80)
RDW: 12 % (ref 11.6–15.4)
WBC: 6.8 10*3/uL (ref 3.4–10.8)

## 2022-04-11 LAB — COMPREHENSIVE METABOLIC PANEL
ALT: 31 IU/L (ref 0–44)
AST: 19 IU/L (ref 0–40)
Albumin/Globulin Ratio: 1.9 (ref 1.2–2.2)
Albumin: 4.4 g/dL (ref 3.8–4.9)
Alkaline Phosphatase: 72 IU/L (ref 44–121)
BUN/Creatinine Ratio: 15 (ref 9–20)
BUN: 12 mg/dL (ref 6–24)
Bilirubin Total: 0.5 mg/dL (ref 0.0–1.2)
CO2: 27 mmol/L (ref 20–29)
Calcium: 9.6 mg/dL (ref 8.7–10.2)
Chloride: 94 mmol/L — ABNORMAL LOW (ref 96–106)
Creatinine, Ser: 0.78 mg/dL (ref 0.76–1.27)
Globulin, Total: 2.3 g/dL (ref 1.5–4.5)
Glucose: 107 mg/dL — ABNORMAL HIGH (ref 70–99)
Potassium: 5.1 mmol/L (ref 3.5–5.2)
Sodium: 137 mmol/L (ref 134–144)
Total Protein: 6.7 g/dL (ref 6.0–8.5)
eGFR: 103 mL/min/{1.73_m2} (ref >59)

## 2022-04-11 LAB — HEMOGLOBIN A1C
Est. average glucose Bld gHb Est-mCnc: 117 mg/dL
Hgb A1c MFr Bld: 5.7 % — ABNORMAL HIGH (ref 4.8–5.6)

## 2022-04-11 LAB — LIPID PANEL
Chol/HDL Ratio: 4 ratio (ref 0.0–5.0)
Cholesterol, Total: 188 mg/dL (ref 100–199)
HDL: 47 mg/dL (ref >39)
LDL Chol Calc (NIH): 119 mg/dL — ABNORMAL HIGH (ref 0–99)
Triglycerides: 125 mg/dL (ref 0–149)
VLDL Cholesterol Cal: 22 mg/dL (ref 5–40)

## 2022-04-11 LAB — PSA: Prostate Specific Ag, Serum: 0.7 ng/mL (ref 0.0–4.0)

## 2022-04-11 LAB — TSH+FREE T4
Free T4: 1.33 ng/dL (ref 0.82–1.77)
TSH: 1.95 u[IU]/mL (ref 0.450–4.500)

## 2022-04-11 LAB — B12 AND FOLATE PANEL
Folate: 10.4 ng/mL (ref >3.0)
Vitamin B-12: 542 pg/mL (ref 232–1245)

## 2022-04-11 LAB — MAGNESIUM: Magnesium: 2.1 mg/dL (ref 1.6–2.3)

## 2022-04-11 LAB — PHOSPHORUS: Phosphorus: 3.5 mg/dL (ref 2.8–4.1)

## 2022-04-14 NOTE — Progress Notes (Signed)
Mild elevation of LDL and glucose. HgbA1c 5.7. other labs normal. Discuss with patient at visit 04/18/2022.

## 2022-04-15 ENCOUNTER — Encounter: Payer: Self-pay | Admitting: Cardiology

## 2022-04-17 ENCOUNTER — Other Ambulatory Visit: Payer: Self-pay | Admitting: Cardiology

## 2022-04-17 NOTE — Progress Notes (Signed)
Complete physical exam   Patient: Ralph Mack   DOB: [birth year]   56 y.o. Male  MRN: [id number] Visit Date: 04/18/2022    Chief Complaint  Patient presents with   Annual Exam   Subjective    Ralph Mack is a 56 y.o. male who presents today for a complete physical exam.  He reports consuming a  generally healthy  diet. The patient has a physically strenuous job, but has no regular exercise apart from work.  He generally feels fairly well. He does have additional problems to discuss today.   HPI  Annual physical -had routine, fasting labs prior to this visit . --mild elevation of LDL at 119 --glucose 107 with HgbA1c 5.7 --other labs essentially normal.    Past Medical History:  Diagnosis Date   Acute on chronic respiratory failure ([place]) 01/06/2021   Walk Test 02/20/21- desat to 85% during second lap with HR 90. Improved to 95% on 2L. Patient denied shortness of breath during walk.   Allergy    Arthritis    COPD (chronic obstructive pulmonary disease) (HCC)    Hx of colonic polyp 11/13/2016   10/2016 7 mm ssp/a - recall 2023   Hypertension    Hypothyroidism    IBS (irritable bowel syndrome)    Lymphocytic colitis 04/19/2019   Mild CAD 2009   Obesity    OSA (obstructive sleep apnea) 04/20/2021   Paroxysmal atrial flutter (HCC)    Prediabetes    Recurrent upper respiratory infection (URI)    Past Surgical History:  Procedure Laterality Date   A-FLUTTER ABLATION N/A 04/06/2021   Procedure: A-FLUTTER ABLATION;  Surgeon: Vickie Epley, MD;  Location: [facility];  Service: Cardiovascular;  Laterality: N/A;   CARDIAC ELECTROPHYSIOLOGY STUDY AND ABLATION     COLONOSCOPY  2018   CORONARY ANGIOGRAM  2009   Social History   Socioeconomic History   Marital status: Married    Spouse name: Not on file   Number of children: 2   Years of education: Not on file   Highest education level: Not on file  Occupational History   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     Social Determinants of Health   Financial Resource Strain: Not on file  Food Insecurity: Not on file  Transportation Needs: Not on file  Physical Activity: Not on file  Stress: Not on file  Social Connections: Not on file  Intimate Partner Violence: Not on file   Family Status  Relation Name Status   Mother  Deceased   Father  Deceased   Daughter  Alive   Son  Alive   MGM  Deceased   MGF  Deceased   [place]  Deceased   PGF  Deceased   Neg  Hx  (Not Specified)   Family History  Problem Relation Age of Onset   Asthma Mother    COPD Mother    Cancer Mother        Skin   Cancer - Other Mother        bone, liver, breast, lung   Emphysema Mother    Heart disease Father        Pacemaker   Parkinson's disease Father    Depression Father    Depression Daughter    OCD Daughter    Alcohol abuse Maternal Grandmother    Schizophrenia Maternal Grandmother    Diabetes Paternal Grandfather    Kidney disease Paternal Grandfather    Colon polyps Neg Hx    Colon cancer Neg Hx    Esophageal cancer Neg Hx    Stomach cancer Neg Hx    Rectal cancer Neg Hx    No Known Allergies  Patient Care Team: Ronnell Freshwater, NP as PCP - General (Family Medicine) Minus Breeding, MD as PCP - Cardiology (Cardiology) Vickie Epley, MD as PCP - Electrophysiology (Cardiology) Melida Quitter, MD as  Consulting Physician (Otolaryngology) Kennith Gain, MD as Consulting Physician (Allergy) Gatha Mayer, MD as Consulting Physician (Gastroenterology) Cottle, Billey Co., MD as Attending Physician (Psychiatry)   Medications: Outpatient Medications Prior to Visit  Medication Sig   allopurinol (ZYLOPRIM) 300 MG tablet Take 1 tablet by mouth once daily   cefUROXime (CEFTIN) 500 MG tablet Take 1 tablet (500 mg total) by mouth 2 (two) times daily with a meal.   Cholecalciferol (VITAMIN D3) 5000 units TABS 5,000 IU OTC vitamin D3 daily. (Patient taking differently: Take 5,000 Units by mouth daily.)   dicyclomine (BENTYL) 20 MG tablet Take I tablet by mouth 4 times daily before meals and at bedtime as needed   diltiazem (CARDIZEM CD) 120 MG 24 hr capsule Take 1 capsule by mouth once daily   diphenhydrAMINE HCl, Sleep, 50 MG CAPS Take 50 mg by mouth at bedtime as needed (for sleep).   fexofenadine (ALLEGRA) 180 MG tablet Take 180 mg by mouth daily as needed for allergies.   fluticasone (FLONASE) 50 MCG/ACT nasal spray Place 2 sprays into the nose daily.   hydrochlorothiazide (MICROZIDE) 12.5 MG capsule TAKE 1 CAPSULE BY MOUTH ONCE DAILY . APPOINTMENT REQUIRED FOR FUTURE REFILLS   levothyroxine (SYNTHROID) 50 MCG tablet Take 1 tablet by mouth once daily   losartan (COZAAR) 50 MG tablet TAKE 1 TABLET BY MOUTH ONCE DAILY. APPOINTMENT REQUIRED FOR FUTURE REFILLS   oxymetazoline (AFRIN) 0.05 % nasal spray Place 1 spray into both nostrils 2 (two) times daily as needed for congestion.   [DISCONTINUED] albuterol (PROAIR HFA) 108 (90 Base) MCG/ACT inhaler Inhale 2 puffs into the lungs every 4 (four) hours as needed for wheezing or shortness of breath.   [DISCONTINUED] propranolol (INDERAL) 20 MG tablet Take 1 tablet (20 mg total) by mouth 3 (three) times daily.   [DISCONTINUED] tadalafil (CIALIS) 10 MG tablet TAKE 1 TABLET BY MOUTH ONCE DAILY AS NEEDED FOR ERECTILE DYSFUNCTION   aspirin EC 81 MG  tablet Take 1 tablet (81 mg total) by mouth daily. Swallow whole. (Patient not taking: Reported on 04/03/2022)   busPIRone (BUSPAR) 15 MG tablet Take 1 tablet (15 mg total) by mouth in the morning.   fluconazole (DIFLUCAN) 150 MG tablet Take 1 tablet po once. May repeat dose in 3 days as needed for persistent symptoms. (Patient not taking: Reported on 04/03/2022)  ipratropium-albuterol (DUONEB) 0.5-2.5 (3) MG/3ML SOLN Take 3 mLs by nebulization every 6 (six) hours as needed. (Patient not taking: Reported on 04/03/2022)   mometasone-formoterol (DULERA) 200-5 MCG/ACT AERO Inhale 2 puffs into the lungs 2 (two) times daily.   nystatin (MYCOSTATIN) 100000 UNIT/ML suspension Swish and swallow  with 35ms QID for 7 days (Patient not taking: Reported on 04/03/2022)   Sodium Chloride-Sodium Bicarb (AYR SALINE NASAL RINSE NA) Place 1 Dose into the nose daily as needed (congestion). (Patient not taking: Reported on 04/03/2022)   Tiotropium Bromide Monohydrate (SPIRIVA RESPIMAT) 2.5 MCG/ACT AERS INHALE 2 SPRAY(S) BY MOUTH ONCE DAILY (Patient not taking: Reported on 04/03/2022)   vitamin B-12 (CYANOCOBALAMIN) 100 MCG tablet Take 100 mcg by mouth daily. (Patient not taking: Reported on 04/03/2022)   [DISCONTINUED] azithromycin (ZITHROMAX) 250 MG tablet z-pack - take as directed for 5 days (Patient not taking: Reported on 04/03/2022)   [DISCONTINUED] ferrous sulfate 325 (65 FE) MG tablet Take 1 tablet (325 mg total) by mouth daily with breakfast. (Patient not taking: Reported on 04/03/2022)   [DISCONTINUED] predniSONE (STERAPRED UNI-PAK 21 TAB) 10 MG (21) TBPK tablet 6 day taper - take by mouth as directed for 6 days (Patient not taking: Reported on 04/03/2022)   No facility-administered medications prior to visit.    Review of Systems  Constitutional:  Negative for activity change, chills, fatigue and fever.  HENT:  Negative for congestion, postnasal drip, rhinorrhea, sinus pressure, sinus pain, sneezing and sore throat.   Eyes:  Negative.   Respiratory:  Positive for cough and wheezing. Negative for shortness of breath.        Intermittent.   Cardiovascular:  Negative for chest pain and palpitations.  Gastrointestinal:  Negative for constipation, diarrhea, nausea and vomiting.  Endocrine: Negative for cold intolerance, heat intolerance, polydipsia and polyuria.  Genitourinary:  Negative for dysuria, frequency and urgency.  Musculoskeletal:  Negative for back pain and myalgias.  Skin:  Negative for rash.  Allergic/Immunologic: Positive for environmental allergies.  Neurological:  Negative for dizziness, weakness and headaches.  Psychiatric/Behavioral:  The patient is not nervous/anxious.     Last CBC Lab Results  Component Value Date   WBC 6.8 04/10/2022   HGB 15.5 04/10/2022   HCT 46.5 04/10/2022   MCV 97 04/10/2022   MCH 32.3 04/10/2022   RDW 12.0 04/10/2022   PLT 179 016/03/3709  Last metabolic panel Lab Results  Component Value Date   GLUCOSE 107 (H) 04/10/2022   NA 137 04/10/2022   K 5.1 04/10/2022   CL 94 (L) 04/10/2022   CO2 27 04/10/2022   BUN 12 04/10/2022   CREATININE 0.78 04/10/2022   EGFR 103 04/10/2022   CALCIUM 9.6 04/10/2022   PHOS 3.5 04/10/2022   PROT 6.7 04/10/2022   ALBUMIN 4.4 04/10/2022   LABGLOB 2.3 04/10/2022   AGRATIO 1.9 04/10/2022   BILITOT 0.5 04/10/2022   ALKPHOS 72 04/10/2022   AST 19 04/10/2022   ALT 31 04/10/2022   ANIONGAP 7 08/13/2021   Last lipids Lab Results  Component Value Date   CHOL 188 04/10/2022   HDL 47 04/10/2022   LDLCALC 119 (H) 04/10/2022   TRIG 125 04/10/2022   CHOLHDL 4.0 04/10/2022   Last hemoglobin A1c Lab Results  Component Value Date   HGBA1C 5.7 (H) 04/10/2022   Last thyroid functions Lab Results  Component Value Date   TSH 1.950 04/10/2022   Last vitamin D Lab Results  Component Value Date   VD25OH 35.0 08/07/2020  Last vitamin B12 and Folate Lab Results  Component Value Date   VITAMINB12 542 04/10/2022   FOLATE  10.4 04/10/2022       Objective     Today's Vitals   04/18/22 1454  BP: 108/66  Pulse: 66  SpO2: 92%  Weight: 276 lb 12.8 oz (125.6 kg)  Height: 5' 7.32" (1.71 m)   Body mass index is 42.94 kg/m.   BP Readings from Last 3 Encounters:  04/18/22 108/66  04/03/22 110/64  12/26/21 105/63    Wt Readings from Last 3 Encounters:  04/18/22 276 lb 12.8 oz (125.6 kg)  04/03/22 274 lb 12.8 oz (124.6 kg)  12/26/21 275 lb (124.7 kg)     Physical Exam Vitals and nursing note reviewed.  Constitutional:      Appearance: Normal appearance. He is well-developed.  HENT:     Head: Normocephalic and atraumatic.     Right Ear: Tympanic membrane, ear canal and external ear normal.     Left Ear: Tympanic membrane, ear canal and external ear normal.     Nose: Nose normal.     Mouth/Throat:     Mouth: Mucous membranes are moist.     Pharynx: Oropharynx is clear.  Eyes:     Extraocular Movements: Extraocular movements intact.     Conjunctiva/sclera: Conjunctivae normal.     Pupils: Pupils are equal, round, and reactive to light.  Neck:     Vascular: No carotid bruit.  Cardiovascular:     Rate and Rhythm: Normal rate and regular rhythm.     Pulses: Normal pulses.     Heart sounds: Normal heart sounds.  Pulmonary:     Effort: Pulmonary effort is normal.     Breath sounds: Normal breath sounds.  Abdominal:     General: Bowel sounds are normal. There is no distension.     Palpations: Abdomen is soft. There is no mass.     Tenderness: There is no abdominal tenderness. There is no right CVA tenderness, left CVA tenderness, guarding or rebound.     Hernia: No hernia is present.  Musculoskeletal:        General: Normal range of motion.     Cervical back: Normal range of motion and neck supple.  Lymphadenopathy:     Cervical: No cervical adenopathy.  Skin:    General: Skin is warm and dry.     Capillary Refill: Capillary refill takes less than 2 seconds.  Neurological:     General: No  focal deficit present.     Mental Status: He is alert and oriented to person, place, and time.  Psychiatric:        Mood and Affect: Mood normal.        Behavior: Behavior normal.        Thought Content: Thought content normal.        Judgment: Judgment normal.       Last depression screening scores    04/18/2022    2:58 PM 12/26/2021    1:14 PM 04/24/2021    9:47 AM  PHQ 2/9 Scores  PHQ - 2 Score 0 0 0  PHQ- 9 Score 1 1 0   Last fall risk screening    04/24/2021    9:47 AM  Fall Risk   Falls in the past year? 0  Number falls in past yr: 0  Injury with Fall? 0  Risk for fall due to : No Fall Risks  Follow up Falls evaluation completed  Assessment & Plan    1. Encounter for general adult medical examination with abnormal findings Annual physical today   2. Acquired hypothyroidism Stable. Continue levothyroxine as prescribed   3. Essential hypertension Stable. Continue bp medication as prescribed   4. Generalized anxiety disorder Continue buspirone as prescribed.   5. Prediabetes Recent labs showing HgbA1c 5.7. recommend decreasing carbohydrates and sugar in the diet. Increase water daily.   6. Hyperlipidemia, unspecified hyperlipidemia type Recommend patient limit intake of fried and fatty foods. He should increase intake of lean proteins and green leafy vegetables.    Immunization History  Administered Date(s) Administered   Influenza,inj,Quad PF,6+ Mos 07/26/2015   Tdap 02/27/2022   Zoster Recombinat (Shingrix) 02/27/2022    Health Maintenance  Topic Date Due   Zoster Vaccines- Shingrix (2 of 2) 04/24/2022   INFLUENZA VACCINE  04/30/2022   COVID-19 Vaccine (1) 05/04/2022 (Originally 08/23/1966)   COLONOSCOPY (Pts 45-4yr Insurance coverage will need to be confirmed)  04/08/2024   TETANUS/TDAP  02/28/2032   Hepatitis C Screening  Completed   HIV Screening  Completed   HPV VACCINES  Aged Out    Discussed health benefits of physical activity, and  encouraged him to engage in regular exercise appropriate for his age and condition.  Problem List Items Addressed This Visit       Cardiovascular and Mediastinum   Essential hypertension     Endocrine   Hypothyroidism (Chronic)     Other   Prediabetes (Chronic)   Hyperlipidemia   Generalized anxiety disorder   Other Visit Diagnoses     Encounter for general adult medical examination with abnormal findings    -  Primary        Return in about 6 months (around 10/19/2022) for blood pressure, med refills.        HRonnell Freshwater NP  [phone](phone) [phone](fax)  [place]

## 2022-04-18 ENCOUNTER — Ambulatory Visit (INDEPENDENT_AMBULATORY_CARE_PROVIDER_SITE_OTHER): Payer: BC Managed Care – PPO | Admitting: Nurse Practitioner

## 2022-04-18 ENCOUNTER — Encounter: Payer: Self-pay | Admitting: Nurse Practitioner

## 2022-04-18 VITALS — BP 108/66 | HR 66 | Ht 67.32 in | Wt 276.8 lb

## 2022-04-18 DIAGNOSIS — I10 Essential (primary) hypertension: Secondary | ICD-10-CM

## 2022-04-18 DIAGNOSIS — E039 Hypothyroidism, unspecified: Secondary | ICD-10-CM | POA: Diagnosis not present

## 2022-04-18 DIAGNOSIS — E785 Hyperlipidemia, unspecified: Secondary | ICD-10-CM

## 2022-04-18 DIAGNOSIS — Z0001 Encounter for general adult medical examination with abnormal findings: Secondary | ICD-10-CM | POA: Diagnosis not present

## 2022-04-18 DIAGNOSIS — R7303 Prediabetes: Secondary | ICD-10-CM

## 2022-04-18 DIAGNOSIS — F411 Generalized anxiety disorder: Secondary | ICD-10-CM | POA: Diagnosis not present

## 2022-04-18 MED ORDER — PROPRANOLOL HCL 20 MG PO TABS: 20.0000 mg | ORAL_TABLET | Freq: Three times a day (TID) | ORAL | 1 refills | Status: DC

## 2022-04-19 ENCOUNTER — Other Ambulatory Visit: Payer: Self-pay | Admitting: Physician Assistant

## 2022-04-19 ENCOUNTER — Other Ambulatory Visit: Payer: Self-pay | Admitting: Nurse Practitioner

## 2022-04-19 DIAGNOSIS — F411 Generalized anxiety disorder: Secondary | ICD-10-CM

## 2022-04-19 DIAGNOSIS — I10 Essential (primary) hypertension: Secondary | ICD-10-CM

## 2022-04-19 MED ORDER — PROPRANOLOL HCL 20 MG PO TABS: 20.0000 mg | ORAL_TABLET | Freq: Three times a day (TID) | ORAL | 1 refills | Status: DC

## 2022-04-27 IMAGING — DX DG CHEST 1V PORT
1 series · 1 of 1 positions shown · non-contrast
Comparison: Chest x-ray 01/06/2021.

CLINICAL DATA: Dyspnea, cough.

EXAM:
PORTABLE CHEST 1 VIEW

[chest ap]
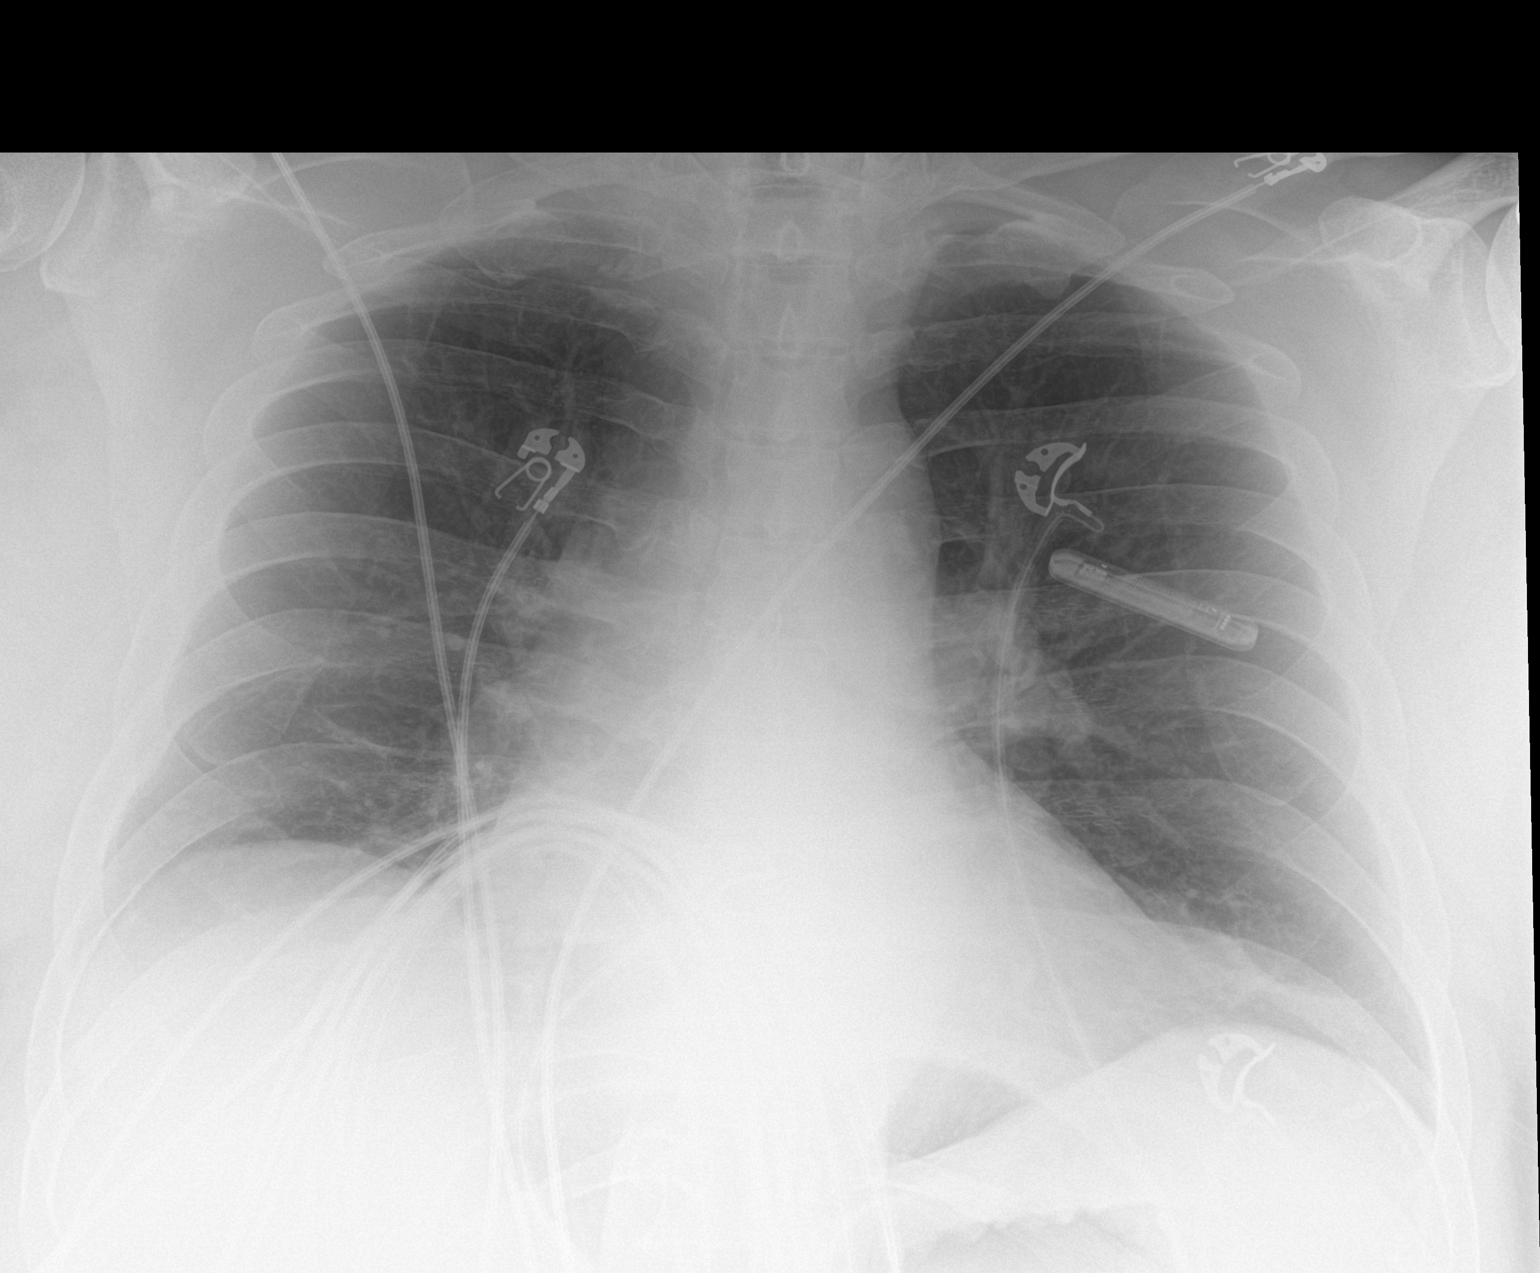

[1 of 1 positions shown; findings below may reference images not displayed]

FINDINGS: The heart size and mediastinal contours are within normal limits.
Linear atelectasis is noted in the left lung base. The lungs are
otherwise clear. Skeletal structures are unremarkable.
IMPRESSION: No active disease.

## 2022-04-30 MED ORDER — ALBUTEROL SULFATE HFA 108 (90 BASE) MCG/ACT IN AERS: 2.0000 | INHALATION_SPRAY | RESPIRATORY_TRACT | 3 refills | Status: DC | PRN

## 2022-05-04 ENCOUNTER — Other Ambulatory Visit: Payer: Self-pay | Admitting: Cardiology

## 2022-05-04 DIAGNOSIS — F411 Generalized anxiety disorder: Secondary | ICD-10-CM | POA: Insufficient documentation

## 2022-05-09 ENCOUNTER — Other Ambulatory Visit: Payer: Self-pay | Admitting: Nurse Practitioner

## 2022-05-09 ENCOUNTER — Other Ambulatory Visit: Payer: Self-pay | Admitting: Cardiology

## 2022-05-09 DIAGNOSIS — I10 Essential (primary) hypertension: Secondary | ICD-10-CM

## 2022-05-30 ENCOUNTER — Ambulatory Visit: Payer: BC Managed Care – PPO | Admitting: Nurse Practitioner

## 2022-05-30 ENCOUNTER — Ambulatory Visit
Admission: EM | Admit: 2022-05-30 | Discharge: 2022-05-30 | Disposition: A | Discharge status: Home / Self Care | Payer: BC Managed Care – PPO | Attending: Physician Assistant | Admitting: Physician Assistant

## 2022-05-30 ENCOUNTER — Encounter: Payer: Self-pay | Admitting: Emergency Medicine

## 2022-05-30 DIAGNOSIS — J441 Chronic obstructive pulmonary disease with (acute) exacerbation: Secondary | ICD-10-CM

## 2022-05-30 MED ORDER — IPRATROPIUM-ALBUTEROL 0.5-2.5 (3) MG/3ML IN SOLN
3.0000 mL | Freq: Once | RESPIRATORY_TRACT | Status: AC
  Administered 2022-05-30: 3 mL via RESPIRATORY_TRACT

## 2022-05-30 MED ORDER — CEFDINIR 300 MG PO CAPS: 300.0000 mg | ORAL_CAPSULE | Freq: Two times a day (BID) | ORAL | 0 refills | Status: DC

## 2022-05-30 MED ORDER — BENZONATATE 100 MG PO CAPS: 100.0000 mg | ORAL_CAPSULE | Freq: Three times a day (TID) | ORAL | 0 refills | Status: DC

## 2022-05-30 MED ORDER — METHYLPREDNISOLONE ACETATE 40 MG/ML IJ SUSP
40.0000 mg | Freq: Once | INTRAMUSCULAR | Status: AC
  Administered 2022-05-30: 40 mg via INTRAMUSCULAR

## 2022-05-30 MED ORDER — PREDNISONE 10 MG (21) PO TBPK: ORAL_TABLET | ORAL | 0 refills | Status: DC

## 2022-05-30 NOTE — Discharge Instructions (Addendum)
I believe that you are having a COPD exacerbation.  I would use your nebulizer every 4-6 hours for the next several days.  Start prednisone tomorrow (05/31/2022).  Do not take NSAIDs including aspirin, ibuprofen/Advil, naproxen/Aleve with this medication.  Use Mucinex, Tylenol, Flonase for symptom relief.  Use Tessalon for cough.  I have called in an antibiotic noted cefdinir.  Take this twice a day for the next 10 days.  Make sure that you rest and drink plenty of fluid.  If your symptoms or not improving you need to be seen immediately.

## 2022-05-30 NOTE — ED Triage Notes (Signed)
Pt is present today with c/o cough. Pt sx started one week ago.

## 2022-05-30 NOTE — ED Provider Notes (Signed)
[facility]    CSN: [id number] Arrival date & time: 05/30/22  1046      History   Chief Complaint Chief Complaint  Patient presents with   Cough    HPI Ralph Mack is a 56 y.o. male.   Patient presents today with a 1 week history of worsening cough and shortness of breath.  He has a history of COPD currently managed with Dulera, Spiriva, DuoNebs.  He has not taken nebulizer treatment in approximately a week.  Reports that he has been exposed to allergens as he was working in the yard before symptoms began.  He thought that he could manage symptoms with his medication but reports that symptoms have been persistent/worsening and he is now having coughing fits that keep him up at night prompting evaluation.  He does not have oxygen at home.  He denies any recent antibiotic or steroids; was last treated for COPD exacerbation in March 2023.  Denies history of diabetes.  He is a former smoker who quit over a year ago.    Past Medical History:  Diagnosis Date   Acute on chronic respiratory failure ([place]) 01/06/2021   Walk Test 02/20/21- desat to 85% during second lap with HR 90. Improved to 95% on 2L. Patient denied shortness of breath during walk.   Allergy    Arthritis    COPD (chronic obstructive pulmonary disease) (HCC)    Hx of colonic polyp 11/13/2016   10/2016 7 mm ssp/a - recall 2023   Hypertension    Hypothyroidism    IBS (irritable bowel syndrome)    Lymphocytic colitis 04/19/2019   Mild CAD 2009   Obesity    OSA (obstructive sleep apnea) 04/20/2021   Paroxysmal atrial flutter (HCC)    Prediabetes    Recurrent upper respiratory infection (URI)     Patient Active Problem List   Diagnosis Date Noted   Generalized anxiety disorder 05/04/2022   Community acquired pneumonia 12/30/2021   Oropharyngeal candidiasis 12/30/2021   Shortness of breath 12/23/2021   Breast tenderness in male 12/23/2021   Chronic respiratory failure with hypoxia ([place]) 08/17/2021    COPD with acute exacerbation ([place]) 08/11/2021   OSA (obstructive sleep apnea) 04/20/2021   COPD (chronic obstructive pulmonary disease) ([place]) 03/08/2021   Multiple pulmonary nodules 03/08/2021   Typical atrial flutter ([place]) 02/08/2021   Acute pain of left knee 02/01/2021   Cough 12/19/2020   Wheezing 12/19/2020   Simple chronic bronchitis ([place]) 12/18/2020   Morbid (severe) obesity due to excess calories ([place]) 12/18/2020   CAD (coronary artery disease) 02/08/2020   Rapid weight gain 10/25/2019   Head injury 06/24/2019   ?able Irritable bowel syndrome with primarily diarrhea 05/25/2019   Orthostatic lightheadedness 05/25/2019   Lymphocytic colitis 04/19/2019   Hyperlipidemia 01/06/2019   Mood disorder ([place]) 10/05/2018   Dyslipidemia 09/11/2018   Essential hypertension 09/11/2018   Chest pain 09/11/2018   HTN (hypertension), malignant 09/02/2018   Stress-related physiological response affecting physical condition 12-Sep-2018   Death of family member 09-12-18   Sleeping difficulty 08/17/2018   Irritability and anger 08/17/2018   Low testosterone in male 08/17/2018   White coat syndrome without diagnosis of hypertension 10/14/2017   Hx of colonic polyp 11/13/2016   Hypothyroidism 10/24/2016   Urinary frequency 10/24/2016   Fatigue 10/24/2016   OAB (overactive bladder) 10/24/2016   Low libido 09/26/2016   Environmental and seasonal allergies 09/26/2016   History of substance abuse ([place]) 09/26/2016   Hematochezia 09/26/2016   Family  hx of colon cancer requiring screening colonoscopy 09/26/2016   Prediabetes 09/05/2015   Vitamin D deficiency 09/05/2015   Hypertriglyceridemia 09/05/2015   Gout 07/26/2015   Obesity, Class II, BMI 35-39.9, isolated 07/26/2015   Former smoker 07/26/2015   Recurrent sinusitis 07/26/2015   Cervical disc disease 07/26/2015    Past Surgical History:  Procedure Laterality Date   A-FLUTTER ABLATION N/A 04/06/2021   Procedure: A-FLUTTER ABLATION;   Surgeon: Vickie Epley, MD;  Location: [facility];  Service: Cardiovascular;  Laterality: N/A;   CARDIAC ELECTROPHYSIOLOGY STUDY AND ABLATION     COLONOSCOPY  2018   CORONARY ANGIOGRAM  2009       Home Medications    Prior to Admission medications   Medication Sig Start Date End Date Taking? Authorizing Provider  benzonatate (TESSALON) 100 MG capsule Take 1 capsule (100 mg total) by mouth every 8 (eight) hours. 05/30/22  Yes Terah Robey K, PA-C  cefdinir (OMNICEF) 300 MG capsule Take 1 capsule (300 mg total) by mouth 2 (two) times daily. 05/30/22  Yes Lazette Estala K, PA-C  predniSONE (STERAPRED UNI-PAK 21 TAB) 10 MG (21) TBPK tablet As directed 05/30/22  Yes Jerine Surles K, PA-C  albuterol (PROAIR HFA) 108 (90 Base) MCG/ACT inhaler Inhale 2 puffs into the lungs every 4 (four) hours as needed for wheezing or shortness of breath. 04/30/22   Brand Males, MD  allopurinol (ZYLOPRIM) 300 MG tablet Take 1 tablet by mouth once daily 03/20/22   Ronnell Freshwater, NP  aspirin EC 81 MG tablet Take 1 tablet (81 mg total) by mouth daily. Swallow whole. Patient not taking: Reported on 04/03/2022 07/16/21   Vickie Epley, MD  busPIRone (BUSPAR) 15 MG tablet Take 1 tablet (15 mg total) by mouth in the morning. 10/05/21 04/03/22  Thayer Headings, PMHNP  Cholecalciferol (VITAMIN D3) 5000 units TABS 5,000 IU OTC vitamin D3 daily. Patient taking differently: Take 5,000 Units by mouth daily. 10/24/16   [name], DO  dicyclomine (BENTYL) 20 MG tablet Take I tablet by mouth 4 times daily before meals and at bedtime as needed 09/05/21   Gatha Mayer, MD  diltiazem (CARDIZEM CD) 120 MG 24 hr capsule Take 1 capsule by mouth once daily 05/06/22   Minus Breeding, MD  diphenhydrAMINE HCl, Sleep, 50 MG CAPS Take 50 mg by mouth at bedtime as needed (for sleep).    [provider]  fexofenadine (ALLEGRA) 180 MG tablet Take 180 mg by mouth daily as needed for allergies.    [provider]  fluconazole (DIFLUCAN) 150 MG tablet Take 1 tablet po once. May repeat dose in 3 days as needed for persistent symptoms. Patient not taking: Reported on 04/03/2022 12/26/21   Ronnell Freshwater, NP  fluticasone (FLONASE) 50 MCG/ACT nasal spray Place 2 sprays into the nose daily.    [provider]  hydrochlorothiazide (MICROZIDE) 12.5 MG capsule TAKE 1 CAPSULE BY MOUTH ONCE DAILY. APPOINTMENTS  REQUIRED  FOR  FUTURE  REFILLS 05/09/22   Ronnell Freshwater, NP  ipratropium-albuterol (DUONEB) 0.5-2.5 (3) MG/3ML SOLN Take 3 mLs by nebulization every 6 (six) hours as needed. Patient not taking: Reported on 04/03/2022 12/13/21   Ronnell Freshwater, NP  levothyroxine (SYNTHROID) 50 MCG tablet Take 1 tablet by mouth once daily 03/25/22   Abonza, Maritza, PA-C  losartan (COZAAR) 50 MG tablet TAKE 1 TABLET BY MOUTH ONCE DAILY. APPOINTMENT NEEDED FOR FUTURE REFILLS 05/09/22   Ronnell Freshwater, NP  mometasone-formoterol [facility])  200-5 MCG/ACT AERO Inhale 2 puffs into the lungs 2 (two) times daily. 09/18/21 04/03/22  Brand Males, MD  nystatin (MYCOSTATIN) 100000 UNIT/ML suspension Swish and swallow  with 37ms QID for 7 days Patient not taking: Reported on 04/03/2022 12/26/21   BRonnell Freshwater NP  oxymetazoline (AFRIN) 0.05 % nasal spray Place 1 spray into both nostrils 2 (two) times daily as needed for congestion.    [provider]  propranolol (INDERAL) 20 MG tablet Take 1 tablet (20 mg total) by mouth 3 (three) times daily. 04/19/22   BRonnell Freshwater NP  Sodium Chloride-Sodium Bicarb (AYR SALINE NASAL RINSE NA) Place 1 Dose into the nose daily as needed (congestion). Patient not taking: Reported on 04/03/2022    [provider]  tadalafil (CIALIS) 10 MG tablet TAKE 1 TABLET BY MOUTH ONCE DAILY AS NEEDED FOR ERECTILE DYSFUNCTION 05/14/22   HMinus Breeding MD  Tiotropium Bromide Monohydrate (SPIRIVA RESPIMAT) 2.5 MCG/ACT AERS INHALE 2 SPRAY(S) BY MOUTH ONCE DAILY Patient not  taking: Reported on 04/03/2022 01/30/22   Parrett, TFonnie Mu NP  vitamin B-12 (CYANOCOBALAMIN) 100 MCG tablet Take 100 mcg by mouth daily. Patient not taking: Reported on 04/03/2022    [provider]  apixaban (ELIQUIS) 5 MG TABS tablet Take 1 tablet (5 mg total) by mouth 2 (two) times daily. Patient not taking: No sig reported 01/10/21 04/06/21  PFlora Lipps MD    Family History Family History  Problem Relation Age of Onset   Asthma Mother    COPD Mother    Cancer Mother        Skin   Cancer - Other Mother        bone, liver, breast, lung   Emphysema Mother    Heart disease Father        Pacemaker   Parkinson's disease Father    Depression Father    Depression Daughter    OCD Daughter    Alcohol abuse Maternal Grandmother    Schizophrenia Maternal Grandmother    Diabetes Paternal Grandfather    Kidney disease Paternal Grandfather    Colon polyps Neg Hx    Colon cancer Neg Hx    Esophageal cancer Neg Hx    Stomach cancer Neg Hx    Rectal cancer Neg Hx     Social History Social History   Tobacco Use   Smoking status: Former    Packs/day: 0.50    Years: 37.00    Total pack years: 18.50    Types: Cigarettes    Quit date: 01/10/2021    Years since quitting: 1.3   Smokeless tobacco: Never   Tobacco comments:    not smoking currently-[place] 02/20/21  Vaping Use   Vaping Use: Former  Substance Use Topics   Alcohol use: Yes    Alcohol/week: 12.0 standard drinks of alcohol    Types: 12 Cans of beer per week   Drug use: No     Allergies   Patient has no known allergies.   Review of Systems Review of Systems  Constitutional:  Positive for activity change. Negative for appetite change, fatigue and fever.  HENT:  Negative for congestion, sinus pressure, sneezing and sore throat.   Respiratory:  Positive for cough, chest tightness and shortness of breath. Negative for wheezing.   Cardiovascular:  Negative for chest pain.  Gastrointestinal:  Negative for abdominal  pain, diarrhea, nausea and vomiting.  Neurological:  Negative for dizziness, light-headedness and headaches.     Physical Exam Triage Vital  Signs ED Triage Vitals  Enc Vitals Group     BP 05/30/22 1055 115/78     Pulse Rate 05/30/22 1055 61     Resp 05/30/22 1055 16     Temp 05/30/22 1055 98 F (36.7 C)     Temp src --      SpO2 05/30/22 1055 90 %     Weight --      Height --      Head Circumference --      Peak Flow --      Pain Score 05/30/22 1053 0     Pain Loc --      Pain Edu? --      Excl. in [place]? --    No data found.  Updated Vital Signs BP 115/78   Pulse (!) 58   Temp 98 F (36.7 C)   Resp 16   SpO2 92%   Visual Acuity Right Eye Distance:   Left Eye Distance:   Bilateral Distance:    Right Eye Near:   Left Eye Near:    Bilateral Near:     Physical Exam Vitals reviewed.  Constitutional:      General: He is awake.     Appearance: Normal appearance. He is well-developed. He is not ill-appearing.     Comments: Very pleasant male appears stated age in no acute distress sitting comfortably in exam room.  Speaking in full sentences.  HENT:     Head: Normocephalic and atraumatic.     Right Ear: Tympanic membrane, ear canal and external ear normal. Tympanic membrane is not erythematous or bulging.     Left Ear: Tympanic membrane, ear canal and external ear normal. Tympanic membrane is not erythematous or bulging.     Nose: Nose normal.     Mouth/Throat:     Pharynx: Uvula midline. Posterior oropharyngeal erythema present. No oropharyngeal exudate or uvula swelling.  Cardiovascular:     Rate and Rhythm: Normal rate and regular rhythm.     Heart sounds: Normal heart sounds, S1 normal and S2 normal. No murmur heard. Pulmonary:     Effort: Pulmonary effort is normal. No accessory muscle usage or respiratory distress.     Breath sounds: No stridor. Wheezing present. No rhonchi or rales.     Comments: Scattered wheezing throughout lung fields Neurological:      Mental Status: He is alert.  Psychiatric:        Behavior: Behavior is cooperative.      UC Treatments / Results  Labs (all labs ordered are listed, but only abnormal results are displayed) Labs Reviewed - No data to display  EKG   Radiology No results found.  Procedures Procedures (including critical care time)  Medications Ordered in UC Medications  ipratropium-albuterol (DUONEB) 0.5-2.5 (3) MG/3ML nebulizer solution 3 mL (3 mLs Nebulization Given 05/30/22 1113)  methylPREDNISolone acetate (DEPO-MEDROL) injection 40 mg (40 mg Intramuscular Given 05/30/22 1113)    Initial Impression / Assessment and Plan / UC Course  I have reviewed the triage vital signs and the nursing notes.  Pertinent labs & imaging results that were available during my care of the patient were reviewed by me and considered in my medical decision making (see chart for details).     Patient is well-appearing, afebrile, nontoxic, nontachycardic.  He did have slightly low oxygen saturation on intake at 90% but this improved following DuoNeb to 92/93%.  This is his baseline according to previous records.  Discussed that he should use  DuoNebs on a scheduled basis to manage COPD exacerbation.  He was given 40 mg of Depo-Medrol in clinic and started on prednisone taper tomorrow (05/31/2022).  Discussed that he should avoid NSAIDs with this medication due to risk of GI bleeding.  We will start Omnicef to for additional symptom relief to cover for infectious etiology.  Can use over-the-counter medications including Mucinex, Tylenol, Flonase for symptom relief.  He was prescribed Tessalon for cough.  Discussed that if his symptoms are not improving or if anything worsens he needs to be reevaluated immediately.  Strict return precautions given to which he expressed understanding.  Work excuse note provided.  Final Clinical Impressions(s) / UC Diagnoses   Final diagnoses:  COPD exacerbation ([place])     Discharge  Instructions      I believe that you are having a COPD exacerbation.  I would use your nebulizer every 4-6 hours for the next several days.  Start prednisone tomorrow (05/31/2022).  Do not take NSAIDs including aspirin, ibuprofen/Advil, naproxen/Aleve with this medication.  Use Mucinex, Tylenol, Flonase for symptom relief.  I have called in an antibiotic noted cefdinir.  Take this twice a day for the next 10 days.  Make sure that you rest and drink plenty of fluid.  If your symptoms or not improving you need to be seen immediately.       ED Prescriptions     Medication Sig Dispense Auth. Provider   predniSONE (STERAPRED UNI-PAK 21 TAB) 10 MG (21) TBPK tablet As directed 21 tablet Teshaun Olarte K, PA-C   cefdinir (OMNICEF) 300 MG capsule Take 1 capsule (300 mg total) by mouth 2 (two) times daily. 20 capsule Tuyet Bader K, PA-C   benzonatate (TESSALON) 100 MG capsule Take 1 capsule (100 mg total) by mouth every 8 (eight) hours. 21 capsule Orlanda Frankum K, PA-C      PDMP not reviewed this encounter.   Terrilee Croak, PA-C 05/30/22 1132

## 2022-06-01 ENCOUNTER — Other Ambulatory Visit: Payer: Self-pay | Admitting: Cardiology

## 2022-06-06 ENCOUNTER — Other Ambulatory Visit: Payer: Self-pay | Admitting: Nurse Practitioner

## 2022-06-06 ENCOUNTER — Other Ambulatory Visit: Payer: Self-pay | Admitting: Physician Assistant

## 2022-06-06 DIAGNOSIS — E039 Hypothyroidism, unspecified: Secondary | ICD-10-CM

## 2022-06-06 DIAGNOSIS — I10 Essential (primary) hypertension: Secondary | ICD-10-CM

## 2022-06-07 ENCOUNTER — Encounter: Payer: Self-pay | Admitting: Nurse Practitioner

## 2022-06-07 NOTE — Telephone Encounter (Signed)
Ralph Mack.  Looks like the CT has been ordered. Radiology calls him to schedule the CT, correct?

## 2022-06-09 NOTE — Telephone Encounter (Signed)
Sorry Judson Roch.  I wasn't really sure either. Thank you for responding, though. Nira Conn

## 2022-06-18 ENCOUNTER — Other Ambulatory Visit: Payer: Self-pay | Admitting: Nurse Practitioner

## 2022-06-18 ENCOUNTER — Ambulatory Visit (HOSPITAL_COMMUNITY)
Admission: RE | Admit: 2022-06-18 | Discharge: 2022-06-18 | Disposition: A | Discharge status: Home / Self Care | Payer: BC Managed Care – PPO | Source: Ambulatory Visit | Attending: Cardiology | Admitting: Cardiology

## 2022-06-18 DIAGNOSIS — R918 Other nonspecific abnormal finding of lung field: Secondary | ICD-10-CM | POA: Diagnosis not present

## 2022-06-18 DIAGNOSIS — R911 Solitary pulmonary nodule: Secondary | ICD-10-CM | POA: Diagnosis not present

## 2022-06-18 DIAGNOSIS — M1000 Idiopathic gout, unspecified site: Secondary | ICD-10-CM

## 2022-06-19 ENCOUNTER — Other Ambulatory Visit: Payer: Self-pay | Admitting: Nurse Practitioner

## 2022-06-19 DIAGNOSIS — I10 Essential (primary) hypertension: Secondary | ICD-10-CM

## 2022-07-04 ENCOUNTER — Other Ambulatory Visit: Payer: Self-pay | Admitting: Nurse Practitioner

## 2022-07-04 DIAGNOSIS — I10 Essential (primary) hypertension: Secondary | ICD-10-CM

## 2022-07-05 ENCOUNTER — Other Ambulatory Visit: Payer: Self-pay | Admitting: Cardiology

## 2022-07-11 ENCOUNTER — Other Ambulatory Visit: Payer: Self-pay | Admitting: Nurse Practitioner

## 2022-07-11 DIAGNOSIS — I10 Essential (primary) hypertension: Secondary | ICD-10-CM

## 2022-07-19 ENCOUNTER — Other Ambulatory Visit: Payer: Self-pay | Admitting: Nurse Practitioner

## 2022-07-19 DIAGNOSIS — I10 Essential (primary) hypertension: Secondary | ICD-10-CM

## 2022-08-02 ENCOUNTER — Other Ambulatory Visit: Payer: Self-pay | Admitting: Internal Medicine

## 2022-08-02 ENCOUNTER — Other Ambulatory Visit: Payer: Self-pay | Admitting: Cardiology

## 2022-08-02 ENCOUNTER — Other Ambulatory Visit: Payer: Self-pay | Admitting: Adult Health

## 2022-08-08 ENCOUNTER — Ambulatory Visit (INDEPENDENT_AMBULATORY_CARE_PROVIDER_SITE_OTHER): Payer: BC Managed Care – PPO

## 2022-08-08 ENCOUNTER — Encounter: Payer: Self-pay | Admitting: Emergency Medicine

## 2022-08-08 ENCOUNTER — Ambulatory Visit
Admission: EM | Admit: 2022-08-08 | Discharge: 2022-08-08 | Disposition: A | Discharge status: Home / Self Care | Payer: BC Managed Care – PPO | Attending: Internal Medicine | Admitting: Internal Medicine

## 2022-08-08 DIAGNOSIS — J069 Acute upper respiratory infection, unspecified: Secondary | ICD-10-CM | POA: Diagnosis not present

## 2022-08-08 DIAGNOSIS — J441 Chronic obstructive pulmonary disease with (acute) exacerbation: Secondary | ICD-10-CM | POA: Diagnosis not present

## 2022-08-08 DIAGNOSIS — R059 Cough, unspecified: Secondary | ICD-10-CM | POA: Insufficient documentation

## 2022-08-08 DIAGNOSIS — R0602 Shortness of breath: Secondary | ICD-10-CM | POA: Diagnosis not present

## 2022-08-08 DIAGNOSIS — Z1152 Encounter for screening for COVID-19: Secondary | ICD-10-CM | POA: Diagnosis not present

## 2022-08-08 DIAGNOSIS — J9811 Atelectasis: Secondary | ICD-10-CM | POA: Diagnosis not present

## 2022-08-08 DIAGNOSIS — H9209 Otalgia, unspecified ear: Secondary | ICD-10-CM | POA: Insufficient documentation

## 2022-08-08 LAB — RESP PANEL BY RT-PCR (RSV, FLU A&B, COVID)  RVPGX2
Influenza A by PCR: NEGATIVE
Influenza B by PCR: NEGATIVE
Resp Syncytial Virus by PCR: NEGATIVE
SARS Coronavirus 2 by RT PCR: NEGATIVE

## 2022-08-08 MED ORDER — IPRATROPIUM-ALBUTEROL 0.5-2.5 (3) MG/3ML IN SOLN
3.0000 mL | Freq: Once | RESPIRATORY_TRACT | Status: AC
  Administered 2022-08-08: 3 mL via RESPIRATORY_TRACT

## 2022-08-08 MED ORDER — METHYLPREDNISOLONE SODIUM SUCC 125 MG IJ SOLR
80.0000 mg | Freq: Once | INTRAMUSCULAR | Status: AC
  Administered 2022-08-08: 80 mg via INTRAMUSCULAR

## 2022-08-08 MED ORDER — PREDNISONE 10 MG (21) PO TBPK: ORAL_TABLET | Freq: Every day | ORAL | 0 refills | Status: DC

## 2022-08-08 MED ORDER — BENZONATATE 100 MG PO CAPS: 100.0000 mg | ORAL_CAPSULE | Freq: Three times a day (TID) | ORAL | 0 refills | Status: DC | PRN

## 2022-08-08 MED ORDER — DOXYCYCLINE HYCLATE 100 MG PO CAPS: 100.0000 mg | ORAL_CAPSULE | Freq: Two times a day (BID) | ORAL | 0 refills | Status: DC

## 2022-08-08 NOTE — ED Triage Notes (Signed)
Pt is present today with left ear pain, cough, and congestion. Pt sx started one week ago.

## 2022-08-08 NOTE — Discharge Instructions (Signed)
I have prescribed you prednisone steroid which you will start taking tomorrow given that you received a steroid injection today in urgent care.  I have also prescribed an antibiotic and a cough medication.  You may start taking these today.  Please start taking your albuterol nebulizer treatments today and routinely take them every 6 hours for the next 24 hours while awake and then as needed.  Please monitor your oxygen very closely at home and go to the hospital if it remains low.  Please also go to the hospital if any symptoms persist or worsen.

## 2022-08-08 NOTE — ED Provider Notes (Signed)
[facility]    CSN: [id number] Arrival date & time: 08/08/22  0904      History   Chief Complaint Chief Complaint  Patient presents with   Otalgia   Cough    Congestion    Shortness of Breath    HPI Ralph Mack is a 56 y.o. male.   Patient presents with 10-day history of left ear pain, cough, nasal congestion, shortness of breath.  Patient reports that he has not had any known sick contacts or fevers at home.  He does have shortness of breath at baseline given history of COPD but reports that it has felt worsened since he has been sick.  Denies chest pain, sore throat, nausea, vomiting, diarrhea, abdominal pain.  Patient has been using his prescribed inhalers with minimal improvement.  States that his normal oxygen is low 90s to 90-92.  He does not wear oxygen at home.   Otalgia Cough Shortness of Breath   Past Medical History:  Diagnosis Date   Acute on chronic respiratory failure ([place]) 01/06/2021   Walk Test 02/20/21- desat to 85% during second lap with HR 90. Improved to 95% on 2L. Patient denied shortness of breath during walk.   Allergy    Arthritis    COPD (chronic obstructive pulmonary disease) (HCC)    Hx of colonic polyp 11/13/2016   10/2016 7 mm ssp/a - recall 2023   Hypertension    Hypothyroidism    IBS (irritable bowel syndrome)    Lymphocytic colitis 04/19/2019   Mild CAD 2009   Obesity    OSA (obstructive sleep apnea) 04/20/2021   Paroxysmal atrial flutter (HCC)    Prediabetes    Recurrent upper respiratory infection (URI)     Patient Active Problem List   Diagnosis Date Noted   Generalized anxiety disorder 05/04/2022   Community acquired pneumonia 12/30/2021   Oropharyngeal candidiasis 12/30/2021   Shortness of breath 12/23/2021   Breast tenderness in male 12/23/2021   Chronic respiratory failure with hypoxia ([place]) 08/17/2021   COPD with acute exacerbation ([place]) 08/11/2021   OSA (obstructive sleep apnea) 04/20/2021   COPD  (chronic obstructive pulmonary disease) ([place]) 03/08/2021   Multiple pulmonary nodules 03/08/2021   Typical atrial flutter ([place]) 02/08/2021   Acute pain of left knee 02/01/2021   Cough 12/19/2020   Wheezing 12/19/2020   Simple chronic bronchitis ([place]) 12/18/2020   Morbid (severe) obesity due to excess calories ([place]) 12/18/2020   CAD (coronary artery disease) 02/08/2020   Rapid weight gain 10/25/2019   Head injury 06/24/2019   ?able Irritable bowel syndrome with primarily diarrhea 05/25/2019   Orthostatic lightheadedness 05/25/2019   Lymphocytic colitis 04/19/2019   Hyperlipidemia 01/06/2019   Mood disorder ([place]) 10/05/2018   Dyslipidemia 09/11/2018   Essential hypertension 09/11/2018   Chest pain 09/11/2018   HTN (hypertension), malignant 09/02/2018   Stress-related physiological response affecting physical condition 2018/09/08   Death of family member 09-08-18   Sleeping difficulty 08/17/2018   Irritability and anger 08/17/2018   Low testosterone in male 08/17/2018   White coat syndrome without diagnosis of hypertension 10/14/2017   Hx of colonic polyp 11/13/2016   Hypothyroidism 10/24/2016   Urinary frequency 10/24/2016   Fatigue 10/24/2016   OAB (overactive bladder) 10/24/2016   Low libido 09/26/2016   Environmental and seasonal allergies 09/26/2016   History of substance abuse ([place]) 09/26/2016   Hematochezia 09/26/2016   Family hx of colon cancer requiring screening colonoscopy 09/26/2016   Prediabetes 09/05/2015   Vitamin D deficiency  09/05/2015   Hypertriglyceridemia 09/05/2015   Gout 07/26/2015   Obesity, Class II, BMI 35-39.9, isolated 07/26/2015   Former smoker 07/26/2015   Recurrent sinusitis 07/26/2015   Cervical disc disease 07/26/2015    Past Surgical History:  Procedure Laterality Date   A-FLUTTER ABLATION N/A 04/06/2021   Procedure: A-FLUTTER ABLATION;  Surgeon: Vickie Epley, MD;  Location: [facility];  Service: Cardiovascular;   Laterality: N/A;   CARDIAC ELECTROPHYSIOLOGY STUDY AND ABLATION     COLONOSCOPY  2018   CORONARY ANGIOGRAM  2009       Home Medications    Prior to Admission medications   Medication Sig Start Date End Date Taking? Authorizing Provider  benzonatate (TESSALON) 100 MG capsule Take 1 capsule (100 mg total) by mouth every 8 (eight) hours as needed for cough. 08/08/22  Yes Alexis Mizuno, Hildred Alamin E, FNP  doxycycline (VIBRAMYCIN) 100 MG capsule Take 1 capsule (100 mg total) by mouth 2 (two) times daily. 08/08/22  Yes Kenyen Candy, Hildred Alamin E, FNP  predniSONE (STERAPRED UNI-PAK 21 TAB) 10 MG (21) TBPK tablet Take by mouth daily. Take 6 tabs by mouth daily  for 2 days, then 5 tabs for 2 days, then 4 tabs for 2 days, then 3 tabs for 2 days, 2 tabs for 2 days, then 1 tab by mouth daily for 2 days 08/08/22  Yes Elmer Merwin, Hildred Alamin E, FNP  albuterol (PROAIR HFA) 108 (90 Base) MCG/ACT inhaler Inhale 2 puffs into the lungs every 4 (four) hours as needed for wheezing or shortness of breath. 04/30/22   Brand Males, MD  allopurinol (ZYLOPRIM) 300 MG tablet Take 1 tablet by mouth once daily 06/18/22   Ronnell Freshwater, NP  aspirin EC 81 MG tablet Take 1 tablet (81 mg total) by mouth daily. Swallow whole. Patient not taking: Reported on 04/03/2022 07/16/21   Vickie Epley, MD  busPIRone (BUSPAR) 15 MG tablet Take 1 tablet (15 mg total) by mouth in the morning. 10/05/21 04/03/22  Thayer Headings, PMHNP  cefdinir (OMNICEF) 300 MG capsule Take 1 capsule (300 mg total) by mouth 2 (two) times daily. 05/30/22   Raspet, Derry Skill, PA-C  Cholecalciferol (VITAMIN D3) 5000 units TABS 5,000 IU OTC vitamin D3 daily. Patient taking differently: Take 5,000 Units by mouth daily. 10/24/16   [name], DO  dicyclomine (BENTYL) 20 MG tablet Take I tablet by mouth 4 times daily before meals and at bedtime as needed 09/05/21   Gatha Mayer, MD  diltiazem (CARDIZEM CD) 120 MG 24 hr capsule Take 1 capsule by mouth once daily 08/02/22   Minus Breeding, MD   diphenhydrAMINE HCl, Sleep, 50 MG CAPS Take 50 mg by mouth at bedtime as needed (for sleep).    [provider]  DULERA 200-5 MCG/ACT AERO INHALE 2 PUFFS TWICE DAILY 08/02/22   Brand Males, MD  fexofenadine (ALLEGRA) 180 MG tablet Take 180 mg by mouth daily as needed for allergies.    [provider]  fluconazole (DIFLUCAN) 150 MG tablet Take 1 tablet po once. May repeat dose in 3 days as needed for persistent symptoms. Patient not taking: Reported on 04/03/2022 12/26/21   Ronnell Freshwater, NP  fluticasone (FLONASE) 50 MCG/ACT nasal spray Place 2 sprays into the nose daily.    [provider]  hydrochlorothiazide (MICROZIDE) 12.5 MG capsule TAKE 1 CAPSULE BY MOUTH ONCE DAILY. APPOINTMENTS REQUIRED FOR FUTURE REFILLS 07/19/22   Ronnell Freshwater, NP  ipratropium-albuterol (DUONEB) 0.5-2.5 (3) MG/3ML SOLN Take 3 mLs  by nebulization every 6 (six) hours as needed. Patient not taking: Reported on 04/03/2022 12/13/21   Ronnell Freshwater, NP  levothyroxine (SYNTHROID) 50 MCG tablet Take 1 tablet by mouth once daily 06/06/22   Ronnell Freshwater, NP  losartan (COZAAR) 50 MG tablet TAKE 1 TABLET BY MOUTH ONCE DAILY . APPOINTMENT REQUIRED FOR FUTURE REFILLS 07/04/22   Ronnell Freshwater, NP  nystatin (MYCOSTATIN) 100000 UNIT/ML suspension Swish and swallow  with 44ms QID for 7 days Patient not taking: Reported on 04/03/2022 12/26/21   BRonnell Freshwater NP  oxymetazoline (AFRIN) 0.05 % nasal spray Place 1 spray into both nostrils 2 (two) times daily as needed for congestion.    [provider]  propranolol (INDERAL) 20 MG tablet Take 1 tablet (20 mg total) by mouth 3 (three) times daily. 04/19/22   BRonnell Freshwater NP  Sodium Chloride-Sodium Bicarb (AYR SALINE NASAL RINSE NA) Place 1 Dose into the nose daily as needed (congestion). Patient not taking: Reported on 04/03/2022    [provider]  tadalafil (CIALIS) 10 MG tablet TAKE 1 TABLET BY MOUTH ONCE DAILY AS NEEDED FOR  ERECTILE DYSFUNCTION 07/05/22   HMinus Breeding MD  Tiotropium Bromide Monohydrate (SPIRIVA RESPIMAT) 2.5 MCG/ACT AERS INHALE 2 SPRAY(S) BY MOUTH ONCE DAILY Patient not taking: Reported on 04/03/2022 01/30/22   Parrett, TFonnie Mu NP  vitamin B-12 (CYANOCOBALAMIN) 100 MCG tablet Take 100 mcg by mouth daily. Patient not taking: Reported on 04/03/2022    [provider]  apixaban (ELIQUIS) 5 MG TABS tablet Take 1 tablet (5 mg total) by mouth 2 (two) times daily. Patient not taking: No sig reported 01/10/21 04/06/21  PFlora Lipps MD    Family History Family History  Problem Relation Age of Onset   Asthma Mother    COPD Mother    Cancer Mother        Skin   Cancer - Other Mother        bone, liver, breast, lung   Emphysema Mother    Heart disease Father        Pacemaker   Parkinson's disease Father    Depression Father    Depression Daughter    OCD Daughter    Alcohol abuse Maternal Grandmother    Schizophrenia Maternal Grandmother    Diabetes Paternal Grandfather    Kidney disease Paternal Grandfather    Colon polyps Neg Hx    Colon cancer Neg Hx    Esophageal cancer Neg Hx    Stomach cancer Neg Hx    Rectal cancer Neg Hx     Social History Social History   Tobacco Use   Smoking status: Former    Packs/day: 0.50    Years: 37.00    Total pack years: 18.50    Types: Cigarettes    Quit date: 01/10/2021    Years since quitting: 1.5   Smokeless tobacco: Never   Tobacco comments:    not smoking currently-[place] 02/20/21  Vaping Use   Vaping Use: Former  Substance Use Topics   Alcohol use: Yes    Alcohol/week: 12.0 standard drinks of alcohol    Types: 12 Cans of beer per week   Drug use: No     Allergies   Patient has no known allergies.   Review of Systems Review of Systems Per HPI  Physical Exam Triage Vital Signs ED Triage Vitals [08/08/22 1010]  Enc Vitals Group     BP 126/82     Pulse Rate 75  Resp 20     Temp 98 F (36.7 C)     Temp src       SpO2 91 %     Weight      Height      Head Circumference      Peak Flow      Pain Score 0     Pain Loc      Pain Edu?      Excl. in [place]?    No data found.  Updated Vital Signs BP 126/82   Pulse 75   Temp 98 F (36.7 C)   Resp 20   SpO2 91%   Visual Acuity Right Eye Distance:   Left Eye Distance:   Bilateral Distance:    Right Eye Near:   Left Eye Near:    Bilateral Near:     Physical Exam Constitutional:      General: He is not in acute distress.    Appearance: Normal appearance. He is not toxic-appearing or diaphoretic.  HENT:     Head: Normocephalic and atraumatic.     Right Ear: Tympanic membrane and ear canal normal.     Left Ear: Tympanic membrane and ear canal normal.     Nose: Congestion present.     Mouth/Throat:     Mouth: Mucous membranes are moist.     Pharynx: No posterior oropharyngeal erythema.  Eyes:     Extraocular Movements: Extraocular movements intact.     Conjunctiva/sclera: Conjunctivae normal.     Pupils: Pupils are equal, round, and reactive to light.  Cardiovascular:     Rate and Rhythm: Normal rate and regular rhythm.     Pulses: Normal pulses.     Heart sounds: Normal heart sounds.  Pulmonary:     Effort: Pulmonary effort is normal. No respiratory distress.     Breath sounds: Normal breath sounds. No stridor. No wheezing, rhonchi or rales.  Abdominal:     General: Abdomen is flat. Bowel sounds are normal.     Palpations: Abdomen is soft.  Musculoskeletal:        General: Normal range of motion.     Cervical back: Normal range of motion.  Skin:    General: Skin is warm and dry.  Neurological:     General: No focal deficit present.     Mental Status: He is alert and oriented to person, place, and time. Mental status is at baseline.  Psychiatric:        Mood and Affect: Mood normal.        Behavior: Behavior normal.      UC Treatments / Results  Labs (all labs ordered are listed, but only abnormal results are  displayed) Labs Reviewed  RESP PANEL BY RT-PCR (RSV, FLU A&B, COVID)  [id number]    EKG   Radiology DG Chest 2 View  Result Date: 08/08/2022 CLINICAL DATA:  Shortness of breath. EXAM: CHEST - 2 VIEW COMPARISON:  12/13/2021 FINDINGS: The lungs are clear without focal pneumonia, edema, pneumothorax or pleural effusion. Trace atelectasis noted right base. Improved aeration at the left base since prior study. No focal consolidation or pleural effusion. The visualized bony structures of the thorax are unremarkable. IMPRESSION: Right base atelectasis. Otherwise, no acute cardiopulmonary findings. Electronically Signed   By: Misty Stanley M.D.   On: 08/08/2022 11:48    Procedures Procedures (including critical care time)  Medications Ordered in UC Medications  methylPREDNISolone sodium succinate (SOLU-MEDROL) 125 mg/2 mL injection 80 mg (80 mg  Intramuscular Given 08/08/22 1030)  ipratropium-albuterol (DUONEB) 0.5-2.5 (3) MG/3ML nebulizer solution 3 mL (3 mLs Nebulization Given 08/08/22 1031)    Initial Impression / Assessment and Plan / UC Course  I have reviewed the triage vital signs and the nursing notes.  Pertinent labs & imaging results that were available during my care of the patient were reviewed by me and considered in my medical decision making (see chart for details).    It appears that patient has viral upper respiratory infection that is causing a COPD exacerbation.  Duoneb and IM Solu-Medrol was administered in urgent care with no improvement in patient's oxygen.  Patient's oxygen was ranging from 84 to 91% while patient was here in urgent care.  Average was 86 to 88%.  He reported that his baseline oxygen ranges from 90 to 92%.  Given that there was not much improvement in oxygen saturation with medications, patient was advised that it would be best to go to the hospital for further evaluation and management for COPD exacerbation treatment.  Patient declined going to the hospital and  was very adamant about not going to the hospital.  Risks associated with not going to the hospital were discussed with patient.  Patient voiced understanding.  Given the patient did not want to go to the hospital, will do limited work-up and treatment here in urgent care.  Chest x-ray was negative for any obvious infections.  Did show some mild atelectasis which albuterol and steroids should be helpful with.  Obtained COVID, flu, RSV test as well which is pending.  Will treat with prednisone steroid taper, doxycycline, benzonatate, albuterol nebulizers.  Patient was advised to take prednisone tomorrow given that he received IM steroid today in urgent care.  Patient reports that he has albuterol nebulizer medication at home and he was encouraged to use this every 6 hours for the next 24 hours while awake.  He states that he also has a pulse oximeter at home and he was advised to monitor this very closely and to go to hospital if it does not improve with his current treatment plan.  He was given strict return and ER precautions.  Patient verbalized understanding and was agreeable with plan. Final Clinical Impressions(s) / UC Diagnoses   Final diagnoses:  COPD exacerbation ([place])  Viral upper respiratory tract infection with cough     Discharge Instructions      I have prescribed you prednisone steroid which you will start taking tomorrow given that you received a steroid injection today in urgent care.  I have also prescribed an antibiotic and a cough medication.  You may start taking these today.  Please start taking your albuterol nebulizer treatments today and routinely take them every 6 hours for the next 24 hours while awake and then as needed.  Please monitor your oxygen very closely at home and go to the hospital if it remains low.  Please also go to the hospital if any symptoms persist or worsen.     ED Prescriptions     Medication Sig Dispense Auth. Provider   doxycycline (VIBRAMYCIN) 100  MG capsule Take 1 capsule (100 mg total) by mouth 2 (two) times daily. 20 capsule [place], [place] E, [place]   predniSONE (STERAPRED UNI-PAK 21 TAB) 10 MG (21) TBPK tablet Take by mouth daily. Take 6 tabs by mouth daily  for 2 days, then 5 tabs for 2 days, then 4 tabs for 2 days, then 3 tabs for 2 days, 2 tabs for 2  days, then 1 tab by mouth daily for 2 days 42 tablet [place], [place] E, [place]   benzonatate (TESSALON) 100 MG capsule Take 1 capsule (100 mg total) by mouth every 8 (eight) hours as needed for cough. 21 capsule [place], Michele Rockers, [place]      PDMP not reviewed this encounter.   Teodora Medici, [place] 08/08/22 1215

## 2022-08-14 ENCOUNTER — Other Ambulatory Visit: Payer: Self-pay | Admitting: Cardiology

## 2022-08-14 ENCOUNTER — Ambulatory Visit: Payer: BC Managed Care – PPO | Admitting: Adult Health

## 2022-08-14 ENCOUNTER — Other Ambulatory Visit: Payer: Self-pay | Admitting: Nurse Practitioner

## 2022-08-14 DIAGNOSIS — I10 Essential (primary) hypertension: Secondary | ICD-10-CM

## 2022-08-15 ENCOUNTER — Encounter: Payer: Self-pay | Admitting: Adult Health

## 2022-08-15 ENCOUNTER — Ambulatory Visit: Payer: BC Managed Care – PPO | Admitting: Adult Health

## 2022-08-15 ENCOUNTER — Telehealth: Payer: Self-pay | Admitting: *Deleted

## 2022-08-15 VITALS — BP 106/60 | HR 68 | Temp 97.9°F | Ht 67.5 in | Wt 265.8 lb

## 2022-08-15 DIAGNOSIS — J441 Chronic obstructive pulmonary disease with (acute) exacerbation: Secondary | ICD-10-CM | POA: Diagnosis not present

## 2022-08-15 DIAGNOSIS — J189 Pneumonia, unspecified organism: Secondary | ICD-10-CM

## 2022-08-15 DIAGNOSIS — G4733 Obstructive sleep apnea (adult) (pediatric): Secondary | ICD-10-CM | POA: Diagnosis not present

## 2022-08-15 MED ORDER — ALBUTEROL SULFATE HFA 108 (90 BASE) MCG/ACT IN AERS: 2.0000 | INHALATION_SPRAY | RESPIRATORY_TRACT | 3 refills | Status: DC | PRN

## 2022-08-15 MED ORDER — SPIRIVA RESPIMAT 2.5 MCG/ACT IN AERS: INHALATION_SPRAY | RESPIRATORY_TRACT | 5 refills | Status: DC

## 2022-08-15 MED ORDER — BENZONATATE 200 MG PO CAPS: 200.0000 mg | ORAL_CAPSULE | Freq: Three times a day (TID) | ORAL | 1 refills | Status: DC | PRN

## 2022-08-15 NOTE — Progress Notes (Signed)
$'@Patient'D$  ID: Ralph Mack, male    DOB: 1966-02-14, 56 y.o.   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  Last Seen December 2022.  Patient Complains over the 2-3 months has had 2 COPD flares requiring antibiotics and prednisone.  Most recently has had increased cough congestion sinus drainage over the last 3 weeks.  Was seen in urgent care and given doxycycline and prednisone has few days left of prescriptions.  Patient says he is started feel better but continues to have significant cough.  Cough is keeping him up at night.  Patient denies any hemoptysis chest pain orthopnea PND or leg swelling. CT chest June 18, 2022 showed stable subcentimeter left lung nodules measuring up to 7 mm and new patchy airspace opacity in the right lower lobe.  Patient has been set up for a CT chest in December for serial follow-up.  He denies any weight loss. He remains on [place] and Spiriva.  Uses his albuterol inhaler as needed.  Has not used any of his albuterol nebulizer.  He  Patient is on nocturnal CPAP for sleep apnea.  Says he wears his CPAP every single night cannot sleep without it.  Patient feels that he benefits from CPAP.  CPAP download shows 100% compliance.  Daily average usage around 6 to 8 hours.  AHI 1.4/hour.   No Known Allergies  Immunization History  Administered Date(s) Administered   Influenza,inj,Quad PF,6+ Mos 07/26/2015   Tdap  02/27/2022   Zoster Recombinat (Shingrix) 02/27/2022    Past Medical History:  Diagnosis Date   Acute on chronic respiratory failure ([facility]) 01/06/2021   Walk Test 02/20/21- desat to 85% during second lap with HR 90. Improved to 95% on 2L. Patient denied shortness of breath during walk.   Allergy    Arthritis    COPD (chronic obstructive pulmonary disease) (HCC)    Hx of colonic polyp 11/13/2016   10/2016 7 mm ssp/a - recall 2023   Hypertension    Hypothyroidism    IBS (irritable bowel syndrome)    Lymphocytic colitis 04/19/2019   Mild CAD 2009   Obesity    OSA (obstructive sleep apnea) 04/20/2021   Paroxysmal atrial flutter (HCC)    Prediabetes    Recurrent upper respiratory infection (URI)     Tobacco History: Social History   Tobacco Use  Smoking Status Former   Packs/day: 0.50   Years: 37.00   Total pack years: 18.50   Types: Cigarettes   Quit date: 01/10/2021   Years since quitting: 1.5  Smokeless Tobacco Never  Tobacco Comments   not smoking currently-[place] 02/20/21   Counseling given: Not Answered Tobacco comments: not smoking currently-[place] 02/20/21   Outpatient Medications Prior to Visit  Medication Sig Dispense Refill   allopurinol (ZYLOPRIM) 300 MG tablet Take 1 tablet by mouth once daily 90 tablet 0   aspirin EC 81 MG tablet Take 1 tablet (81  mg total) by mouth daily. Swallow whole. 90 tablet 3   Cholecalciferol (VITAMIN D3) 5000 units TABS 5,000 IU OTC vitamin D3 daily. (Patient taking differently: Take 5,000 Units by mouth daily.) 90 tablet 3   dicyclomine (BENTYL) 20 MG tablet Take I tablet by mouth 4 times daily before meals and at bedtime as needed 360 tablet 3   diltiazem (CARDIZEM CD) 120 MG 24 hr capsule Take 1 capsule by mouth once daily 90 capsule 0   doxycycline (VIBRAMYCIN) 100 MG capsule Take 1 capsule (100 mg total) by mouth 2 (two) times daily. 20 capsule 0   DULERA 200-5 MCG/ACT AERO INHALE 2 PUFFS TWICE DAILY 13 g 0   fexofenadine (ALLEGRA) 180 MG  tablet Take 180 mg by mouth daily as needed for allergies.     fluticasone (FLONASE) 50 MCG/ACT nasal spray Place 2 sprays into the nose daily.     hydrochlorothiazide (MICROZIDE) 12.5 MG capsule Take 1 capsule by mouth once daily 30 capsule 0   levothyroxine (SYNTHROID) 50 MCG tablet Take 1 tablet by mouth once daily 90 tablet 0   losartan (COZAAR) 50 MG tablet TAKE 1 TABLET BY MOUTH ONCE DAILY . APPOINTMENT REQUIRED FOR FUTURE REFILLS 90 tablet 1   Melatonin 10 MG TABS Take 1 tablet by mouth at bedtime as needed.     predniSONE (STERAPRED UNI-PAK 21 TAB) 10 MG (21) TBPK tablet Take by mouth daily. Take 6 tabs by mouth daily  for 2 days, then 5 tabs for 2 days, then 4 tabs for 2 days, then 3 tabs for 2 days, 2 tabs for 2 days, then 1 tab by mouth daily for 2 days 42 tablet 0   propranolol (INDERAL) 20 MG tablet Take 1 tablet (20 mg total) by mouth 3 (three) times daily. 270 tablet 1   Sodium Chloride-Sodium Bicarb (AYR SALINE NASAL RINSE NA) Place 1 Dose into the nose daily as needed (congestion).     tadalafil (CIALIS) 10 MG tablet TAKE 1 TABLET BY MOUTH ONCE DAILY AS NEEDED FOR ERECTILE DYSFUNCTION 10 tablet 0   vitamin B-12 (CYANOCOBALAMIN) 100 MCG tablet Take 100 mcg by mouth daily.     albuterol (PROAIR HFA) 108 (90 Base) MCG/ACT inhaler Inhale 2 puffs into the lungs every 4 (four) hours as needed for wheezing or shortness of breath. 18 g 3   benzonatate (TESSALON) 100 MG capsule Take 1 capsule (100 mg total) by mouth every 8 (eight) hours as needed for cough. 21 capsule 0   Tiotropium Bromide Monohydrate (SPIRIVA RESPIMAT) 2.5 MCG/ACT AERS INHALE 2 SPRAY(S) BY MOUTH ONCE DAILY 4 g 5   busPIRone (BUSPAR) 15 MG tablet Take 1 tablet (15 mg total) by mouth in the morning. 90 tablet 2   ipratropium-albuterol (DUONEB) 0.5-2.5 (3) MG/3ML SOLN Take 3 mLs by nebulization every 6 (six) hours as needed. (Patient not taking: Reported on 08/15/2022) 120 mL 3   cefdinir (OMNICEF) 300 MG capsule Take 1  capsule (300 mg total) by mouth 2 (two) times daily. (Patient not taking: Reported on 08/15/2022) 20 capsule 0   diphenhydrAMINE HCl, Sleep, 50 MG CAPS Take 50 mg by mouth at bedtime as needed (for sleep). (Patient not taking: Reported on 08/15/2022)     fluconazole (DIFLUCAN) 150 MG tablet Take 1 tablet po once. May repeat dose in 3 days as needed for persistent symptoms. (Patient not taking: Reported on 04/03/2022) 3 tablet 0   nystatin (MYCOSTATIN) 100000 UNIT/ML suspension Swish and swallow  with 46ms QID for  7 days (Patient not taking: Reported on 04/03/2022) 200 mL 1   oxymetazoline (AFRIN) 0.05 % nasal spray Place 1 spray into both nostrils 2 (two) times daily as needed for congestion. (Patient not taking: Reported on 08/15/2022)     No facility-administered medications prior to visit.     Review of Systems:   Constitutional:   No  weight loss, night sweats,  Fevers, chills, + fatigue, or  lassitude.  HEENT:   No headaches,  Difficulty swallowing,  Tooth/dental problems, or  Sore throat,                No sneezing, itching, ear ache,  +nasal congestion, post nasal drip,   CV:  No chest pain,  Orthopnea, PND, swelling in lower extremities, anasarca, dizziness, palpitations, syncope.   GI  No heartburn, indigestion, abdominal pain, nausea, vomiting, diarrhea, change in bowel habits, loss of appetite, bloody stools.   Resp: .  No chest wall deformity  Skin: no rash or lesions.  GU: no dysuria, change in color of urine, no urgency or frequency.  No flank pain, no hematuria   MS:  No joint pain or swelling.  No decreased range of motion.  No back pain.    Physical Exam  BP 106/60 (BP Location: Left Arm, Patient Position: Sitting, Cuff Size: Large)   Pulse 68   Temp 97.9 F (36.6 C) (Oral)   Ht 5' 7.5" (1.715 m)   Wt 265 lb 12.8 oz (120.6 kg)   SpO2 91%   BMI 41.02 kg/m   GEN: A/Ox3; pleasant , NAD, well nourished    HEENT:  [place]/AT,  NOSE-clear, THROAT-clear, no lesions, no  postnasal drip or exudate noted.   NECK:  Supple w/ fair ROM; no JVD; normal carotid impulses w/o bruits; no thyromegaly or nodules palpated; no lymphadenopathy.    RESP  Clear  P & A; w/o, wheezes/ rales/ or rhonchi. no accessory muscle use, no dullness to percussion  CARD:  RRR, no m/r/g, no peripheral edema, pulses intact, no cyanosis or clubbing.  GI:   Soft & nt; nml bowel sounds; no organomegaly or masses detected.   Musco: Warm bil, no deformities or joint swelling noted.   Neuro: alert, no focal deficits noted.    Skin: Warm, no lesions or rashes    Lab Results:  CBC    BNP   ProBNP No results found for: "PROBNP"  Imaging: DG Chest 2 View  Result Date: 08/08/2022 CLINICAL DATA:  Shortness of breath. EXAM: CHEST - 2 VIEW COMPARISON:  12/13/2021 FINDINGS: The lungs are clear without focal pneumonia, edema, pneumothorax or pleural effusion. Trace atelectasis noted right base. Improved aeration at the left base since prior study. No focal consolidation or pleural effusion. The visualized bony structures of the thorax are unremarkable. IMPRESSION: Right base atelectasis. Otherwise, no acute cardiopulmonary findings. Electronically Signed   By: Misty Stanley M.D.   On: 08/08/2022 11:48         Latest Ref Rng & Units 03/08/2021    9:04 AM  PFT Results  FVC-Pre L 3.04   FVC-Predicted Pre % 69   FVC-Post L 3.15   FVC-Predicted Post % 72   Pre FEV1/FVC % % 71   Post FEV1/FCV % % 70   FEV1-Pre L 2.16   FEV1-Predicted Pre % 64   FEV1-Post L 2.22   DLCO uncorrected ml/min/mmHg 16.68   DLCO UNC% % 65   DLCO corrected ml/min/mmHg 16.68   DLCO COR %Predicted % 65  DLVA Predicted % 97   TLC L 5.65   TLC % Predicted % 88   RV % Predicted % 88     No results found for: "NITRICOXIDE"      Assessment & Plan:   COPD with acute exacerbation (HCC) Recurrent COPD exacerbation -continue on triple therapy with Dulera and Spiriva.  On return visit consider changing to  [facility] or Trelegy.  We will have him finish up antibiotics with doxycycline and prednisone.  Add in supportive care with Delsym and Tessalon for cough control.  May use Chlor-[place]eton at bedtime for postnasal drainage.  Plan  Patient Instructions  Finish Prednisone and Doxycycline as directed.  Continue on Dulera 2 puffs Twice daily , rinse after use.  Continue on Spiriva 2 puffs daily  Albuterol inhaler or neb  As needed   Tessalon Three times a day  As needed  cough  Delsym 2 tsp Twice daily  As needed  cough .  Chlorpheniramine '4mg'$  1 At bedtime  As needed  drainage.   Continue on CPAP At bedtime   Work on healthy weight loss.  Do not drive if sleepy  Avoid sedating medications as able.   CT chest in December as planned.   Follow up with Dr. Chase Caller in 6-8 weeks and As needed   Please contact office for sooner follow up if symptoms do not improve or worsen or seek emergency care             Community acquired pneumonia CT chest with right lower lobe patchy opacity consistent with pneumonia.  Status post antibiotic course.  Follow-up CT in December as planned  OSA (obstructive sleep apnea) Excellent control compliance on nocturnal CPAP.  Continue on current settings.  Plan  Patient Instructions  Finish Prednisone and Doxycycline as directed.  Continue on Dulera 2 puffs Twice daily , rinse after use.  Continue on Spiriva 2 puffs daily  Albuterol inhaler or neb  As needed   Tessalon Three times a day  As needed  cough  Delsym 2 tsp Twice daily  As needed  cough .  Chlorpheniramine '4mg'$  1 At bedtime  As needed  drainage.   Continue on CPAP At bedtime   Work on healthy weight loss.  Do not drive if sleepy  Avoid sedating medications as able.   CT chest in December as planned.   Follow up with Dr. Chase Caller in 6-8 weeks and As needed   Please contact office for sooner follow up if symptoms do not improve or worsen or seek emergency care                Rexene Edison, NP 08/15/2022

## 2022-08-15 NOTE — Telephone Encounter (Signed)
Called and spoke with patient, clarified that he is to follow up in 6-8 weeks and not 6-8 months.  Her verbalized understanding.  Nothing further needed.

## 2022-08-15 NOTE — Patient Instructions (Addendum)
Finish Prednisone and Doxycycline as directed.  Continue on Dulera 2 puffs Twice daily , rinse after use.  Continue on Spiriva 2 puffs daily  Albuterol inhaler or neb  As needed   Tessalon Three times a day  As needed  cough  Delsym 2 tsp Twice daily  As needed  cough .  Chlorpheniramine '4mg'$  1 At bedtime  As needed  drainage.   Continue on CPAP At bedtime   Work on healthy weight loss.  Do not drive if sleepy  Avoid sedating medications as able.   CT chest in December as planned.   Follow up with Dr. Chase Caller in 6-8 weeks and As needed   Please contact office for sooner follow up if symptoms do not improve or worsen or seek emergency care

## 2022-08-15 NOTE — Assessment & Plan Note (Signed)
Recurrent COPD exacerbation -continue on triple therapy with Dulera and Spiriva.  On return visit consider changing to [facility] or Trelegy.  We will have him finish up antibiotics with doxycycline and prednisone.  Add in supportive care with Delsym and Tessalon for cough control.  May use Chlor-Trimeton at bedtime for postnasal drainage.  Plan  Patient Instructions  Finish Prednisone and Doxycycline as directed.  Continue on Dulera 2 puffs Twice daily , rinse after use.  Continue on Spiriva 2 puffs daily  Albuterol inhaler or neb  As needed   Tessalon Three times a day  As needed  cough  Delsym 2 tsp Twice daily  As needed  cough .  Chlorpheniramine '4mg'$  1 At bedtime  As needed  drainage.   Continue on CPAP At bedtime   Work on healthy weight loss.  Do not drive if sleepy  Avoid sedating medications as able.   CT chest in December as planned.   Follow up with Dr. Chase Caller in 6-8 weeks and As needed   Please contact office for sooner follow up if symptoms do not improve or worsen or seek emergency care

## 2022-08-15 NOTE — Assessment & Plan Note (Signed)
CT chest with right lower lobe patchy opacity consistent with pneumonia.  Status post antibiotic course.  Follow-up CT in December as planned

## 2022-08-15 NOTE — Assessment & Plan Note (Signed)
Excellent control compliance on nocturnal CPAP.  Continue on current settings.  Plan  Patient Instructions  Finish Prednisone and Doxycycline as directed.  Continue on Dulera 2 puffs Twice daily , rinse after use.  Continue on Spiriva 2 puffs daily  Albuterol inhaler or neb  As needed   Tessalon Three times a day  As needed  cough  Delsym 2 tsp Twice daily  As needed  cough .  Chlorpheniramine '4mg'$  1 At bedtime  As needed  drainage.   Continue on CPAP At bedtime   Work on healthy weight loss.  Do not drive if sleepy  Avoid sedating medications as able.   CT chest in December as planned.   Follow up with Dr. Chase Caller in 6-8 weeks and As needed   Please contact office for sooner follow up if symptoms do not improve or worsen or seek emergency care

## 2022-08-24 ENCOUNTER — Other Ambulatory Visit: Payer: Self-pay | Admitting: Internal Medicine

## 2022-08-28 ENCOUNTER — Other Ambulatory Visit: Payer: Self-pay | Admitting: Nurse Practitioner

## 2022-08-28 DIAGNOSIS — E039 Hypothyroidism, unspecified: Secondary | ICD-10-CM

## 2022-08-29 IMAGING — CR DG CHEST 2V
3 series · 3 of 3 positions shown · non-contrast
Comparison: 08/11/2021, CT 03/06/2021, plain film 12/06/2020

CLINICAL DATA: 55-year-old male with a history of cough wheezing
low oxygen saturation

EXAM:
CHEST - 2 VIEW

[w chest pa]
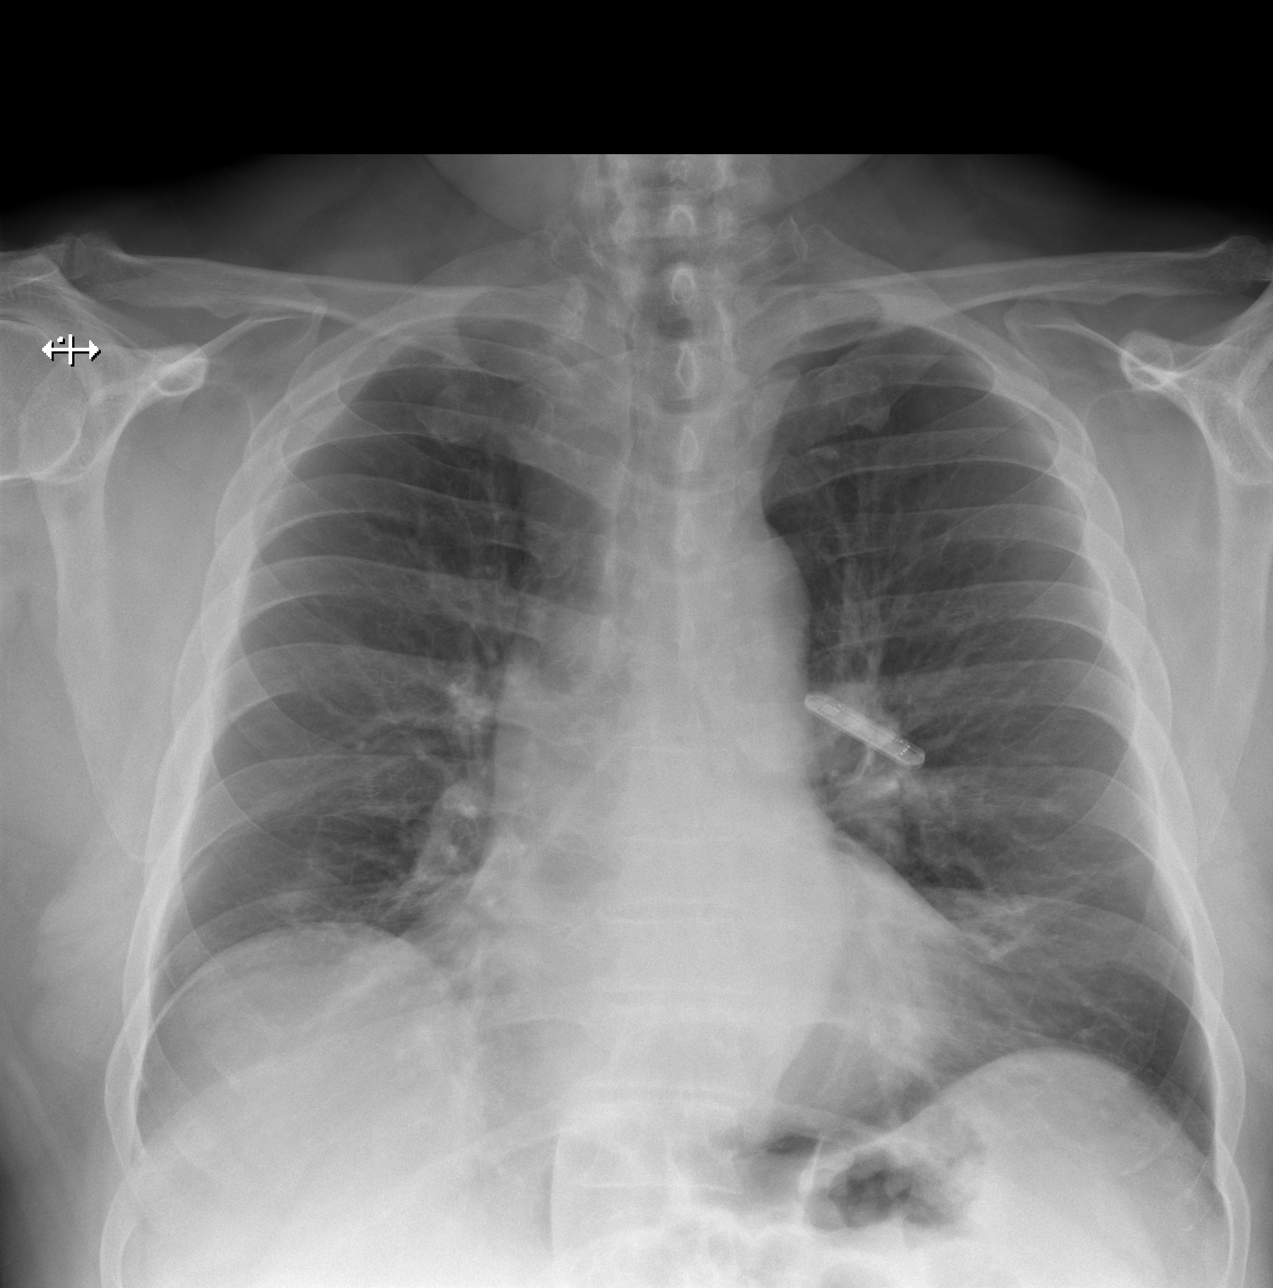

[w chest lat (1 of 2)]
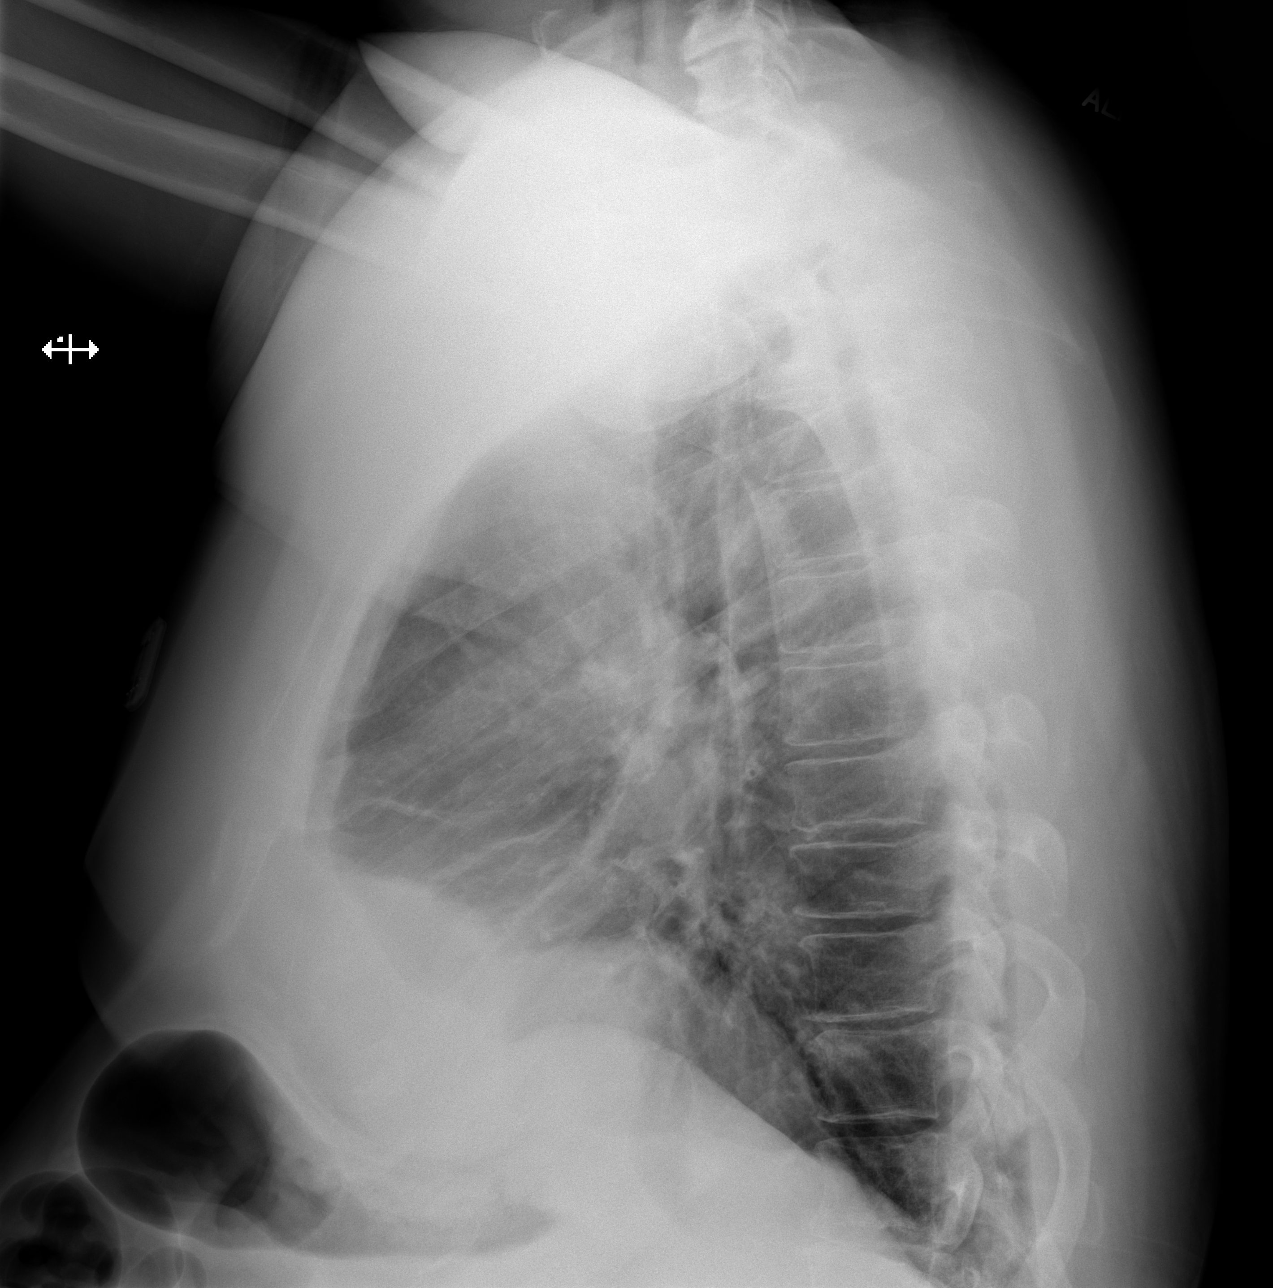

[w chest lat (2 of 2)]
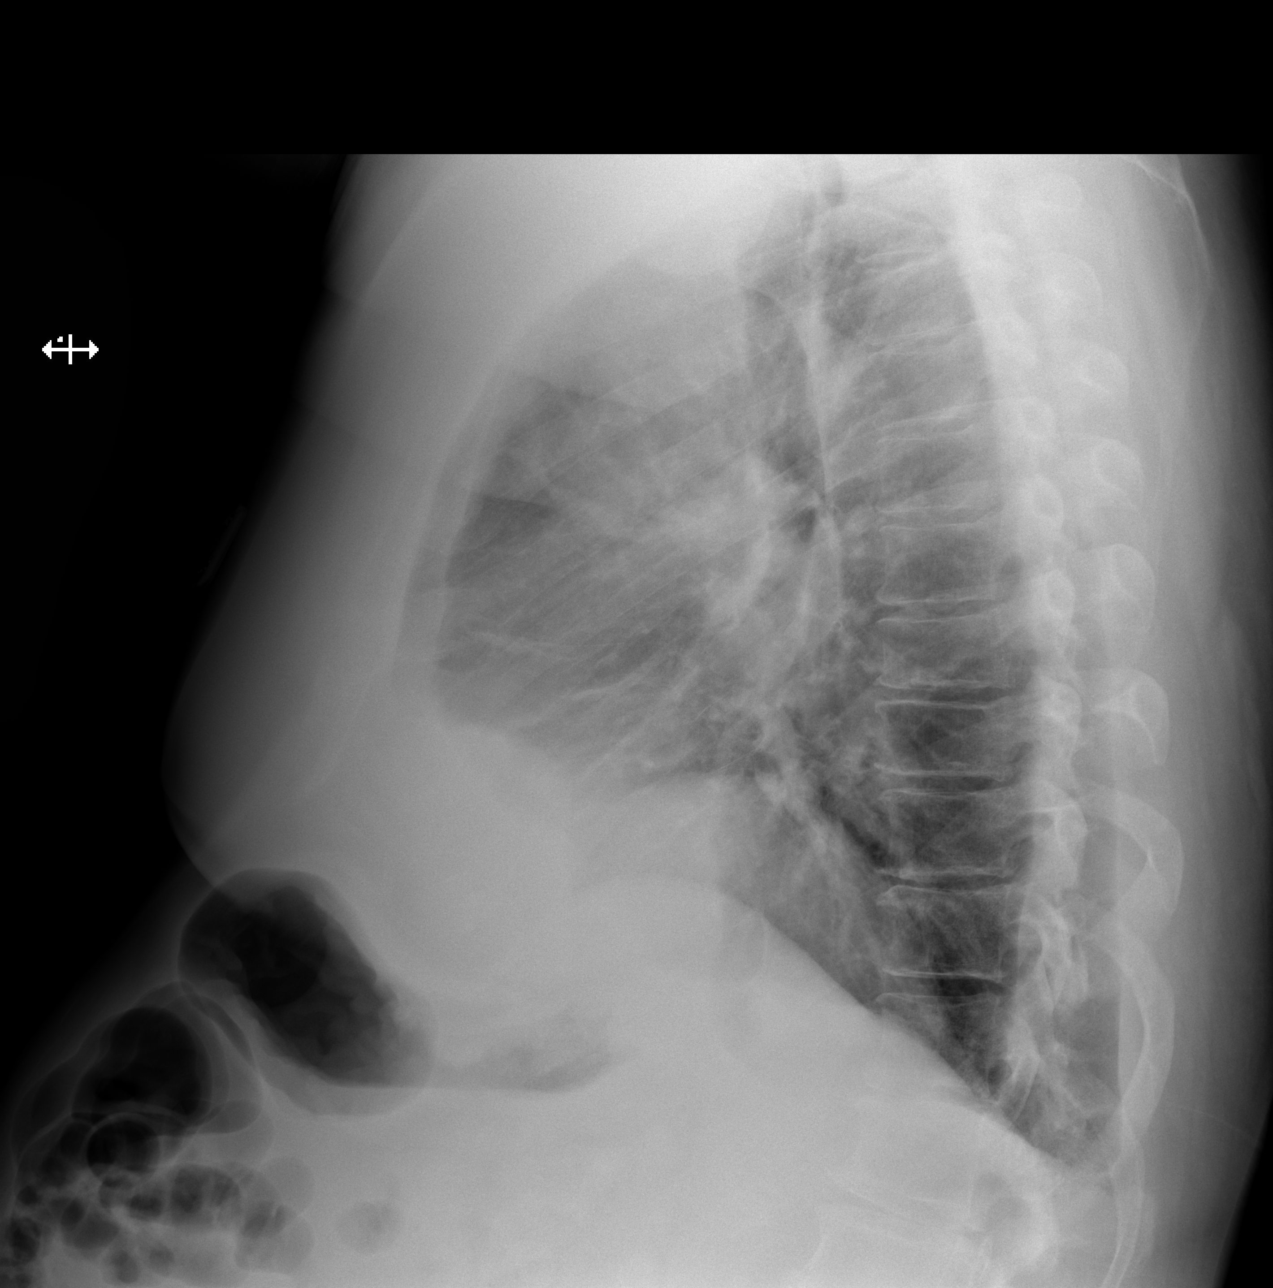

[3 of 3 positions shown; findings below may reference images not displayed]

FINDINGS: Cardiomediastinal silhouette unchanged in size and contour. No
evidence of central vascular congestion. No interlobular septal
thickening.

Event recorder projects over the left hemithorax.

No pneumothorax or pleural effusion. Rounded vague opacity at the
base of the lungs on the lateral view just anterior to the spine,
may correspond to the opacity on the frontal view. New patchy
opacity at the right lung base.

No acute displaced fracture. Degenerative changes of the spine.
IMPRESSION: Vague opacities at the bilateral lung bases, concerning for
multifocal infection. Followup PA and lateral chest X-ray is
recommended in 3-4 weeks following trial of therapy to assure
resolution.

## 2022-08-30 ENCOUNTER — Encounter: Payer: Self-pay | Admitting: Cardiology

## 2022-08-30 ENCOUNTER — Telehealth: Payer: Self-pay | Admitting: Cardiology

## 2022-08-30 DIAGNOSIS — R911 Solitary pulmonary nodule: Secondary | ICD-10-CM

## 2022-08-30 NOTE — Telephone Encounter (Signed)
Pt would like a call back in regards to orders for a CT he would like to get done before his next appt with Dr. Percival Spanish 12/07.

## 2022-08-30 NOTE — Telephone Encounter (Signed)
Left detailed message, there is an order for CT Chest Wo Contrast  in chart.  Please transfer to scheduling.

## 2022-09-03 ENCOUNTER — Ambulatory Visit: Payer: BC Managed Care – PPO | Admitting: Cardiology

## 2022-09-04 ENCOUNTER — Ambulatory Visit
Admission: RE | Admit: 2022-09-04 | Discharge: 2022-09-04 | Disposition: A | Discharge status: Home / Self Care | Payer: BC Managed Care – PPO | Source: Ambulatory Visit | Attending: Cardiology | Admitting: Cardiology

## 2022-09-04 DIAGNOSIS — R911 Solitary pulmonary nodule: Secondary | ICD-10-CM

## 2022-09-04 DIAGNOSIS — I700 Atherosclerosis of aorta: Secondary | ICD-10-CM | POA: Diagnosis not present

## 2022-09-04 NOTE — Progress Notes (Unsigned)
Cardiology Office Note   Date:  09/05/2022   ID:  Ralph Mack, DOB 24-Oct-1965, MRN [id number]  PCP:  Ronnell Freshwater, NP  Cardiologist:   Minus Breeding, MD   Chief Complaint  Patient presents with   Atrial Fibrillation     History of Present Illness: Ralph Mack is a 56 y.o. male who presents for evaluation of HTN and minor coronary disease at catheterization in 2009 when he had a heart catheterization in [place].  He did have a negative treadmill in January 2020.  In No 2022 he was admitted with acute respiratory failure.   In April 2022 he had pneumonia.  He was quite ill and hypoxemic.  Echocardiogram demonstrated no significant abnormality.  He was treated with steroids as well as antibiotics.  He did have paroxysmal atrial flutter.   He was in the hospital again with COPD flare in November 2022.   He is status post atrial flutter ablation in July 2022.  He had another flair 4 weeks ago.  He was not hospitalized.  Since I last saw him he has had no new cardiovascular complaints.  He might have occasional palpitations. The patient denies any new symptoms such as chest discomfort, neck or arm discomfort. There has been no new shortness of breath, PND or orthopnea. There have been no reported palpitations, presyncope or syncope.    Past Medical History:  Diagnosis Date   Acute on chronic respiratory failure ([place]) 01/06/2021   Walk Test 02/20/21- desat to 85% during second lap with HR 90. Improved to 95% on 2L. Patient denied shortness of breath during walk.   Allergy    Arthritis    COPD (chronic obstructive pulmonary disease) (HCC)    Hx of colonic polyp 11/13/2016   10/2016 7 mm ssp/a - recall 2023   Hypertension    Hypothyroidism    IBS (irritable bowel syndrome)    Lymphocytic colitis 04/19/2019   Mild CAD 2009   Obesity    OSA (obstructive sleep apnea) 04/20/2021   Paroxysmal atrial flutter (HCC)    Prediabetes    Recurrent upper respiratory infection (URI)     Past  Surgical History:  Procedure Laterality Date   A-FLUTTER ABLATION N/A 04/06/2021   Procedure: A-FLUTTER ABLATION;  Surgeon: Vickie Epley, MD;  Location: [facility];  Service: Cardiovascular;  Laterality: N/A;   CARDIAC ELECTROPHYSIOLOGY STUDY AND ABLATION     COLONOSCOPY  2018   CORONARY ANGIOGRAM  2009     Current Outpatient Medications  Medication Sig Dispense Refill   albuterol (PROAIR HFA) 108 (90 Base) MCG/ACT inhaler Inhale 2 puffs into the lungs every 4 (four) hours as needed for wheezing or shortness of breath. 18 g 3   allopurinol (ZYLOPRIM) 300 MG tablet Take 1 tablet by mouth once daily 90 tablet 0   aspirin EC 81 MG tablet Take 1 tablet (81 mg total) by mouth daily. Swallow whole. 90 tablet 3   benzonatate (TESSALON) 200 MG capsule Take 1 capsule (200 mg total) by mouth 3 (three) times daily as needed. 45 capsule 1   busPIRone (BUSPAR) 15 MG tablet Take 1 tablet (15 mg total) by mouth in the morning. 90 tablet 2   Cholecalciferol (VITAMIN D3) 5000 units TABS 5,000 IU OTC vitamin D3 daily. (Patient taking differently: Take 5,000 Units by mouth daily.) 90 tablet 3   dicyclomine (BENTYL) 20 MG tablet Take I tablet by mouth 4 times daily before meals and at bedtime as needed  360 tablet 3   diltiazem (CARDIZEM CD) 120 MG 24 hr capsule Take 1 capsule by mouth once daily 90 capsule 0   DULERA 200-5 MCG/ACT AERO INHALE 2 PUFFS TWICE DAILY . APPOINTMENT REQUIRED FOR FUTURE REFILLS 13 g 0   fexofenadine (ALLEGRA) 180 MG tablet Take 180 mg by mouth daily as needed for allergies.     fluticasone (FLONASE) 50 MCG/ACT nasal spray Place 2 sprays into the nose daily.     hydrochlorothiazide (MICROZIDE) 12.5 MG capsule Take 1 capsule by mouth once daily 30 capsule 0   ipratropium-albuterol (DUONEB) 0.5-2.5 (3) MG/3ML SOLN Take 3 mLs by nebulization every 6 (six) hours as needed. 120 mL 3   levothyroxine (SYNTHROID) 50 MCG tablet Take 1 tablet by mouth once daily 30 tablet 1    losartan (COZAAR) 50 MG tablet TAKE 1 TABLET BY MOUTH ONCE DAILY . APPOINTMENT REQUIRED FOR FUTURE REFILLS 90 tablet 1   Melatonin 10 MG TABS Take 1 tablet by mouth at bedtime as needed.     propranolol (INDERAL) 20 MG tablet Take 1 tablet (20 mg total) by mouth 3 (three) times daily. 270 tablet 1   Sodium Chloride-Sodium Bicarb (AYR SALINE NASAL RINSE NA) Place 1 Dose into the nose daily as needed (congestion).     tadalafil (CIALIS) 10 MG tablet TAKE 1 TABLET BY MOUTH ONCE DAILY AS NEEDED FOR ERECTILE DYSFUNCTION 10 tablet 0   Tiotropium Bromide Monohydrate (SPIRIVA RESPIMAT) 2.5 MCG/ACT AERS INHALE 2 SPRAY(S) BY MOUTH ONCE DAILY 4 g 5   vitamin B-12 (CYANOCOBALAMIN) 100 MCG tablet Take 100 mcg by mouth daily.     doxycycline (VIBRAMYCIN) 100 MG capsule Take 1 capsule (100 mg total) by mouth 2 (two) times daily. (Patient not taking: Reported on 09/05/2022) 20 capsule 0   predniSONE (STERAPRED UNI-PAK 21 TAB) 10 MG (21) TBPK tablet Take by mouth daily. Take 6 tabs by mouth daily  for 2 days, then 5 tabs for 2 days, then 4 tabs for 2 days, then 3 tabs for 2 days, 2 tabs for 2 days, then 1 tab by mouth daily for 2 days (Patient not taking: Reported on 09/05/2022) 42 tablet 0   No current facility-administered medications for this visit.    Allergies:   Patient has no known allergies.    ROS:  Please see the history of present illness.   Otherwise, review of systems are positive for none.   All other systems are reviewed and negative.    PHYSICAL EXAM: VS:  BP 124/70   Pulse 76   Ht '5\' 7"'$  (1.702 m)   Wt 269 lb 6.4 oz (122.2 kg)   SpO2 92% Comment: did a breathing treatment this morning.  BMI 42.19 kg/m  , BMI Body mass index is 42.19 kg/m. GENERAL:  Well appearing NECK:  No jugular venous distention, waveform within normal limits, carotid upstroke brisk and symmetric, no bruits, no thyromegaly LUNGS:  Clear to auscultation bilaterally CHEST:  Unremarkable HEART:  PMI not displaced or  sustained,S1 and S2 within normal limits, no S3, no S4, no clicks, no rubs, no murmurs ABD:  Flat, positive bowel sounds normal in frequency in pitch, no bruits, no rebound, no guarding, no midline pulsatile mass, no hepatomegaly, no splenomegaly EXT:  2 plus pulses throughout, no edema, no cyanosis no clubbing  EKG:  EKG is  ordered today. Sinus rhythm, rate 76, nonspecific interventricular conduction delay, left axis deviation, premature atrial contractions, no change from previous.   Recent Labs: 04/10/2022:  ALT 31; BUN 12; Creatinine, Ser 0.78; Hemoglobin 15.5; Magnesium 2.1; Platelets 179; Potassium 5.1; Sodium 137; TSH 1.950    Lipid Panel    Component Value Date/Time   CHOL 188 04/10/2022 0844   TRIG 125 04/10/2022 0844   HDL 47 04/10/2022 0844   CHOLHDL 4.0 04/10/2022 0844   CHOLHDL 3.5 08/12/2021 0530   VLDL 9 08/12/2021 0530   LDLCALC 119 (H) 04/10/2022 0844      Wt Readings from Last 3 Encounters:  09/05/22 269 lb 6.4 oz (122.2 kg)  08/15/22 265 lb 12.8 oz (120.6 kg)  04/18/22 276 lb 12.8 oz (125.6 kg)      Other studies Reviewed: Additional studies/ records that were reviewed today include: Device interrogation Review of the above records demonstrates:  Please see elsewhere in the note.     ASSESSMENT AND PLAN:   ATRIAL FLUTTER: It looks like this was typical and isolated atrial flutter.  Ralph Mack has a CHA2DS2 - VASc score of 2.   We were able to interrogate the device today.  It is atrial fibrillation as a has previously been reviewing the strips he is in sinus rhythm with premature atrial contractions.  There is no evidence of A-fib or flutter.  HTN: The blood pressure is at target.  No change in therapy.   CAD:   He had nonobstructive plaque.   He will continue with risk reduction.  ALCOHOL USE: He understands the suggestion for complete abstinence.  TOBACCO ABUSE: I am proud of him for no longer smoking.   OBESITY: We talked about exercise.    LUNG NODULES:   CT done yesterday demonstrated   I did get the results back after the patient left the office.  The nodules appear to be benign.  They are unchanged from previous.  Current  testmedicines are reviewed at length with the patient today.  The patient does not have concerns regarding medicines.  The following changes have been made:  None  Labs/ tests ordered today include:  None  Orders Placed This Encounter  Procedures   EKG 12-Lead     Disposition:   FU with me in 12 months.    Signed, Minus Breeding, MD  09/05/2022 11:19 AM    [place]

## 2022-09-05 ENCOUNTER — Encounter: Payer: Self-pay | Admitting: Cardiology

## 2022-09-05 ENCOUNTER — Ambulatory Visit: Payer: BC Managed Care – PPO | Attending: Cardiology | Admitting: Cardiology

## 2022-09-05 VITALS — BP 124/70 | HR 76 | Ht 67.0 in | Wt 269.4 lb

## 2022-09-05 DIAGNOSIS — I2510 Atherosclerotic heart disease of native coronary artery without angina pectoris: Secondary | ICD-10-CM | POA: Diagnosis not present

## 2022-09-05 DIAGNOSIS — I4892 Unspecified atrial flutter: Secondary | ICD-10-CM | POA: Diagnosis not present

## 2022-09-05 DIAGNOSIS — I10 Essential (primary) hypertension: Secondary | ICD-10-CM

## 2022-09-05 DIAGNOSIS — Z720 Tobacco use: Secondary | ICD-10-CM

## 2022-09-05 NOTE — Patient Instructions (Signed)
Medication Instructions:  Your physician recommends that you continue on your current medications as directed. Please refer to the Current Medication list given to you today.  *If you need a refill on your cardiac medications before your next appointment, please call your pharmacy*   Lab Work: NONE If you have labs (blood work) drawn today and your tests are completely normal, you will receive your results only by: MyChart Message (if you have MyChart) OR A paper copy in the mail If you have any lab test that is abnormal or we need to change your treatment, we will call you to review the results.   Testing/Procedures: NONE   Follow-Up: At [facility], you and your health needs are our priority.  As part of our continuing mission to provide you with exceptional heart care, we have created designated Provider Care Teams.  These Care Teams include your primary Cardiologist (physician) and Advanced Practice Providers (APPs -  Physician Assistants and Nurse Practitioners) who all work together to provide you with the care you need, when you need it.  We recommend signing up for the patient portal called "MyChart".  Sign up information is provided on this After Visit Summary.  MyChart is used to connect with patients for Virtual Visits (Telemedicine).  Patients are able to view lab/test results, encounter notes, upcoming appointments, etc.  Non-urgent messages can be sent to your provider as well.   To learn more about what you can do with MyChart, go to [email or web address].    Your next appointment:   1 year(s)  The format for your next appointment:   In Person  Provider:   James Hochrein, MD    

## 2022-09-09 ENCOUNTER — Other Ambulatory Visit: Payer: Self-pay | Admitting: Nurse Practitioner

## 2022-09-09 ENCOUNTER — Other Ambulatory Visit: Payer: Self-pay | Admitting: Cardiology

## 2022-09-09 DIAGNOSIS — I10 Essential (primary) hypertension: Secondary | ICD-10-CM

## 2022-10-02 ENCOUNTER — Other Ambulatory Visit: Payer: Self-pay | Admitting: Nurse Practitioner

## 2022-10-02 DIAGNOSIS — R0602 Shortness of breath: Secondary | ICD-10-CM

## 2022-10-02 DIAGNOSIS — J441 Chronic obstructive pulmonary disease with (acute) exacerbation: Secondary | ICD-10-CM

## 2022-10-04 ENCOUNTER — Other Ambulatory Visit: Payer: Self-pay | Admitting: Nurse Practitioner

## 2022-10-04 DIAGNOSIS — I10 Essential (primary) hypertension: Secondary | ICD-10-CM

## 2022-10-15 NOTE — Progress Notes (Signed)
Established patient visit   Patient: Ralph Mack   DOB: [birth year]   57 y.o. Male  MRN: [id number] Visit Date: 10/16/2022   No chief complaint on file.  Subjective    HPI  Follow up  -hypertension  -hypothyroid  -COPD  -GAD    Medications: Outpatient Medications Prior to Visit  Medication Sig   albuterol (PROAIR HFA) 108 (90 Base) MCG/ACT inhaler Inhale 2 puffs into the lungs every 4 (four) hours as needed for wheezing or shortness of breath.   aspirin EC 81 MG tablet Take 1 tablet (81 mg total) by mouth daily. Swallow whole.   busPIRone (BUSPAR) 15 MG tablet Take 1 tablet (15 mg total) by mouth in the morning.   Cholecalciferol (VITAMIN D3) 5000 units TABS 5,000 IU OTC vitamin D3 daily. (Patient taking differently: Take 5,000 Units by mouth daily.)   doxycycline (VIBRAMYCIN) 100 MG capsule Take 1 capsule (100 mg total) by mouth 2 (two) times daily.   fexofenadine (ALLEGRA) 180 MG tablet Take 180 mg by mouth daily as needed for allergies.   fluticasone (FLONASE) 50 MCG/ACT nasal spray Place 2 sprays into the nose daily.   ipratropium-albuterol (DUONEB) 0.5-2.5 (3) MG/3ML SOLN TAKE 3ML IN NEBULIZER EVERY 6 HOURS AS NEEDED   losartan (COZAAR) 50 MG tablet TAKE 1 TABLET BY MOUTH ONCE DAILY . APPOINTMENT REQUIRED FOR FUTURE REFILLS   Melatonin 10 MG TABS Take 1 tablet by mouth at bedtime as needed.   predniSONE (STERAPRED UNI-PAK 21 TAB) 10 MG (21) TBPK tablet Take by mouth daily. Take 6 tabs by mouth daily  for 2 days, then 5 tabs for 2 days, then 4 tabs for 2 days, then 3 tabs for 2 days, 2 tabs for 2 days, then 1 tab by mouth daily for 2 days   Sodium Chloride-Sodium Bicarb (AYR SALINE NASAL RINSE NA) Place 1 Dose into the nose daily as needed (congestion).   tadalafil (CIALIS) 10 MG tablet TAKE 1 TABLET BY MOUTH ONCE DAILY AS NEEDED FOR ERECTILE DYSFUNCTION   vitamin B-12 (CYANOCOBALAMIN) 100 MCG tablet Take 100 mcg by mouth daily.   No facility-administered medications prior to  visit.    Review of Systems     Objective    There were no vitals filed for this visit. There is no height or weight on file to calculate BMI.  BP Readings from Last 3 Encounters:  11/27/22 124/78  09/05/22 124/70  08/15/22 106/60    Wt Readings from Last 3 Encounters:  11/27/22 264 lb (119.7 kg)  09/05/22 269 lb 6.4 oz (122.2 kg)  08/15/22 265 lb 12.8 oz (120.6 kg)    Physical Exam    No results found for any visits on 10/16/22.  Assessment & Plan     Problem List Items Addressed This Visit   None    Return in about 6 months (around 04/16/2023) for health maintenance exam, FBW a week prior to visit.         Ronnell Freshwater, NP  [facility] [phone] (phone) [phone] (fax)  [place]

## 2022-10-16 ENCOUNTER — Other Ambulatory Visit: Payer: Self-pay | Admitting: Nurse Practitioner

## 2022-10-16 ENCOUNTER — Encounter: Payer: Self-pay | Admitting: Nurse Practitioner

## 2022-10-16 ENCOUNTER — Ambulatory Visit (INDEPENDENT_AMBULATORY_CARE_PROVIDER_SITE_OTHER): Payer: BC Managed Care – PPO | Admitting: Nurse Practitioner

## 2022-10-16 DIAGNOSIS — I10 Essential (primary) hypertension: Secondary | ICD-10-CM

## 2022-10-16 DIAGNOSIS — M1000 Idiopathic gout, unspecified site: Secondary | ICD-10-CM

## 2022-10-16 MED ORDER — DULERA 200-5 MCG/ACT IN AERO: INHALATION_SPRAY | RESPIRATORY_TRACT | 5 refills | Status: DC

## 2022-10-16 MED ORDER — SPIRIVA RESPIMAT 2.5 MCG/ACT IN AERS: INHALATION_SPRAY | RESPIRATORY_TRACT | 5 refills | Status: DC

## 2022-11-07 ENCOUNTER — Other Ambulatory Visit: Payer: Self-pay | Admitting: Adult Health

## 2022-11-07 ENCOUNTER — Other Ambulatory Visit: Payer: Self-pay | Admitting: Internal Medicine

## 2022-11-07 ENCOUNTER — Other Ambulatory Visit: Payer: Self-pay | Admitting: Cardiology

## 2022-11-07 ENCOUNTER — Other Ambulatory Visit: Payer: Self-pay | Admitting: Nurse Practitioner

## 2022-11-07 DIAGNOSIS — F411 Generalized anxiety disorder: Secondary | ICD-10-CM

## 2022-11-07 DIAGNOSIS — I10 Essential (primary) hypertension: Secondary | ICD-10-CM

## 2022-11-08 NOTE — Telephone Encounter (Signed)
Approved benzonatate  #45 tabs no refill.

## 2022-11-11 ENCOUNTER — Other Ambulatory Visit: Payer: Self-pay | Admitting: Nurse Practitioner

## 2022-11-11 DIAGNOSIS — M1000 Idiopathic gout, unspecified site: Secondary | ICD-10-CM

## 2022-11-14 ENCOUNTER — Other Ambulatory Visit: Payer: Self-pay | Admitting: Cardiology

## 2022-11-21 ENCOUNTER — Other Ambulatory Visit: Payer: Self-pay | Admitting: Nurse Practitioner

## 2022-11-21 ENCOUNTER — Ambulatory Visit: Payer: BC Managed Care – PPO | Admitting: Nurse Practitioner

## 2022-11-21 DIAGNOSIS — E039 Hypothyroidism, unspecified: Secondary | ICD-10-CM

## 2022-11-24 ENCOUNTER — Encounter: Payer: Self-pay | Admitting: Cardiology

## 2022-11-26 NOTE — Progress Notes (Unsigned)
Cardiology Clinic Note   Patient Name: Ralph Mack Date of Encounter: 11/27/2022  Primary Care Provider:  Ronnell Freshwater, NP Primary Cardiologist:  Ralph Breeding, MD  Patient Profile    [name] 57 year old male presents to the clinic today for follow-up evaluation of his coronary artery disease and atrial flutter.  Past Medical History    Past Medical History:  Diagnosis Date   Acute on chronic respiratory failure ([place]) 01/06/2021   Walk Test 02/20/21- desat to 85% during second lap with HR 90. Improved to 95% on 2L. Patient denied shortness of breath during walk.   Allergy    Arthritis    COPD (chronic obstructive pulmonary disease) (HCC)    Hx of colonic polyp 11/13/2016   10/2016 7 mm ssp/a - recall 2023   Hypertension    Hypothyroidism    IBS (irritable bowel syndrome)    Lymphocytic colitis 04/19/2019   Mild CAD 2009   Obesity    OSA (obstructive sleep apnea) 04/20/2021   Paroxysmal atrial flutter (HCC)    Prediabetes    Recurrent upper respiratory infection (URI)    Past Surgical History:  Procedure Laterality Date   A-FLUTTER ABLATION N/A 04/06/2021   Procedure: A-FLUTTER ABLATION;  Surgeon: Ralph Epley, MD;  Location: [facility];  Service: Cardiovascular;  Laterality: N/A;   CARDIAC ELECTROPHYSIOLOGY STUDY AND ABLATION     COLONOSCOPY  2018   CORONARY ANGIOGRAM  2009    Allergies  No Known Allergies  History of Present Illness    Ralph Mack has a PMH of atrial flutter, coronary artery disease, tobacco abuse, HLD and essential hypertension.  He underwent cardiac catheterization in 2009 in [place].  He was admitted in 2022 with acute respiratory failure and was noted to have pneumonia 4/22.  His echocardiogram at that time showed no significant abnormalities.  He was treated with steroids and antibiotics.  He was noted to have paroxysmal atrial flutter.  He was admitted to the hospital with COPD exacerbation 11/22.  He was seen in  follow-up by Dr. Percival Mack 09/05/2022.  During that time he reported he had another COPD flare 4 weeks prior.  He was not hospitalized.  He denied new cardiac complaints.  He did note occasional palpitations.  He denied chest discomfort, shortness of breath, PND and orthopnea.  He denied presyncope and syncope.  His blood pressure was noted to be 124/70 with a pulse of 76.  Weight 269 pounds.  CHA2DS2-VASc score 2.  His device was interrogated during his appointment and he was noted to have sinus rhythm with PACs and no evidence of A-fib or a flutter.  He contacted the office via myChart 11/24/22 with concern for chest discomfort after eating a fish sandwich and spicy tacos. Discomfort lasted for 8 hrs.   He presents to the clinic today for follow-up evaluation and states he had an episode of chest discomfort after eating a fish sandwich and 2 spicy tacos.  He returned home and took calcium chews which did not help.  He tried to drink a carbonated water which did not help.  He had to sodium bicarbonate cocktails and was able to go to sleep.  He woke up later and continued to not feel well.  He had an episode of dizziness and went back to sleep for around 12 hours.  I interrogated his ILR today with the help of Ralph Mack.  Results were reassuring and showed only occasional PVCs.  No atrial fibrillation pauses  or other arrhythmias were noted.  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  I will order CBC, BMP, echocardiogram and plan follow-up after testing.  Today he denies  lower extremity edema, palpitations, melena, hematuria, hemoptysis, diaphoresis, weakness, presyncope, syncope, orthopnea, and PND.    Home Medications    Prior to Admission medications   Medication Sig Start Date End Date Taking? Authorizing Provider  albuterol (PROAIR HFA) 108 (90 Base) MCG/ACT inhaler Inhale 2 puffs into the lungs every 4 (four) hours as needed for wheezing or shortness  of breath. 08/15/22   Mack, Ralph Mu, NP  allopurinol (ZYLOPRIM) 300 MG tablet Take 1 tablet by mouth once daily 11/12/22   Ralph Freshwater, NP  aspirin EC 81 MG tablet Take 1 tablet (81 mg total) by mouth daily. Swallow whole. 07/16/21   Ralph Epley, MD  benzonatate (TESSALON) 200 MG capsule Take 1 capsule by mouth three times daily as needed 11/08/22   Mack, Ralph S, NP  busPIRone (BUSPAR) 15 MG tablet Take 1 tablet (15 mg total) by mouth in the morning. 10/05/21 09/05/22  Ralph Mack, Ralph Mack  Cholecalciferol (VITAMIN D3) 5000 units TABS 5,000 IU OTC vitamin D3 daily. Patient taking differently: Take 5,000 Units by mouth daily. 10/24/16   Opalski, Neoma Laming, DO  dicyclomine (BENTYL) 20 MG tablet TAKE 1 TABLET BY MOUTH 4 TIMES DAILY BEFORE MEAL(S) AND AT BEDTIME AS NEEDED 11/07/22   Gatha Mayer, MD  diltiazem (CARDIZEM CD) 120 MG 24 hr capsule Take 1 capsule by mouth once daily 11/08/22   Ralph Breeding, MD  doxycycline (VIBRAMYCIN) 100 MG capsule Take 1 capsule (100 mg total) by mouth 2 (two) times daily. Patient not taking: Reported on 09/05/2022 08/08/22   Teodora Medici, FNP  fexofenadine (ALLEGRA) 180 MG tablet Take 180 mg by mouth daily as needed for allergies.    [provider]  fluticasone (FLONASE) 50 MCG/ACT nasal spray Place 2 sprays into the nose daily.    [provider]  hydrochlorothiazide (MICROZIDE) 12.5 MG capsule Take 1 capsule by mouth once daily 10/17/22   Ralph Freshwater, NP  ipratropium-albuterol (DUONEB) 0.5-2.5 (3) MG/3ML SOLN TAKE 3ML IN NEBULIZER EVERY 6 HOURS AS NEEDED 10/02/22   Ralph Freshwater, NP  levothyroxine (SYNTHROID) 50 MCG tablet Take 1 tablet by mouth once daily 11/21/22   Ralph Freshwater, NP  losartan (COZAAR) 50 MG tablet TAKE 1 TABLET BY MOUTH ONCE DAILY . APPOINTMENT REQUIRED FOR FUTURE REFILLS 07/04/22   Ralph Freshwater, NP  Melatonin 10 MG TABS Take 1 tablet by mouth at bedtime as needed.    [provider]   mometasone-formoterol (DULERA) 200-5 MCG/ACT AERO INHALE 2 PUFFS TWICE DAILY . 10/16/22   Brand Males, MD  predniSONE (STERAPRED UNI-PAK 21 TAB) 10 MG (21) TBPK tablet Take by mouth daily. Take 6 tabs by mouth daily  for 2 days, then 5 tabs for 2 days, then 4 tabs for 2 days, then 3 tabs for 2 days, 2 tabs for 2 days, then 1 tab by mouth daily for 2 days Patient not taking: Reported on 09/05/2022 08/08/22   Teodora Medici, FNP  propranolol (INDERAL) 20 MG tablet TAKE 1 TABLET BY MOUTH THREE TIMES DAILY 11/07/22   Ralph Freshwater, NP  Sodium Chloride-Sodium Bicarb (AYR SALINE NASAL RINSE NA) Place 1 Dose into the nose daily as needed (congestion).    [provider]  tadalafil (CIALIS) 10 MG tablet  TAKE 1 TABLET BY MOUTH ONCE DAILY AS NEEDED FOR ERECTILE DYSFUNCTION 09/13/22   Ralph Breeding, MD  Tiotropium Bromide Monohydrate (SPIRIVA RESPIMAT) 2.5 MCG/ACT AERS INHALE 2 SPRAY(S) BY MOUTH ONCE DAILY 10/16/22   Brand Males, MD  vitamin B-12 (CYANOCOBALAMIN) 100 MCG tablet Take 100 mcg by mouth daily.    [provider]  apixaban (ELIQUIS) 5 MG TABS tablet Take 1 tablet (5 mg total) by mouth 2 (two) times daily. Patient not taking: No sig reported 01/10/21 04/06/21  Flora Lipps, MD    Family History    Family History  Problem Relation Age of Onset   Asthma Mother    COPD Mother    Cancer Mother        Skin   Cancer - Other Mother        bone, liver, breast, lung   Emphysema Mother    Heart disease Father        Pacemaker   Parkinson's disease Father    Depression Father    Depression Daughter    OCD Daughter    Alcohol abuse Maternal Grandmother    Schizophrenia Maternal Grandmother    Diabetes Paternal Grandfather    Kidney disease Paternal Grandfather    Colon polyps Neg Hx    Colon cancer Neg Hx    Esophageal cancer Neg Hx    Stomach cancer Neg Hx    Rectal cancer Neg Hx    He indicated that his mother is deceased. He indicated that his father is  deceased. He indicated that his maternal grandmother is deceased. He indicated that his maternal grandfather is deceased. He indicated that his paternal grandmother is deceased. He indicated that his paternal grandfather is deceased. He indicated that his daughter is alive. He indicated that his son is alive. He indicated that the status of his neg hx is unknown.  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     Social Determinants of Health   Financial Resource Strain: Not on file  Food Insecurity: Not on file  Transportation Needs: Not on file  Physical Activity: Not on file  Stress: Not on file  Social Connections: Not on file  Intimate Partner Violence: Not on file     Review of Systems    General:  No chills, fever, night sweats or weight changes.  Cardiovascular:  No chest pain, dyspnea on exertion, edema, orthopnea, palpitations, paroxysmal nocturnal dyspnea. Dermatological: No rash, lesions/masses Respiratory: No cough, dyspnea Urologic: No  hematuria, dysuria Abdominal:   No nausea, vomiting, diarrhea, bright red blood per rectum, melena, or hematemesis Neurologic:   No visual changes, wkns, changes in mental status. All other systems reviewed and are otherwise negative except as noted above.  Physical Exam    VS:  BP 124/78 (BP Location: Left Arm, Patient Position: Sitting, Cuff Size: Large)   Pulse 65   Ht '5\' 7"'$  (1.702 m)   Wt 264 lb (119.7 kg)   BMI 41.35 kg/m  , BMI Body mass index is 41.35 kg/m. GEN: Well nourished, well developed, in no acute distress. HEENT: normal. Neck: Supple, no JVD, carotid bruits, or masses. Cardiac: RRR, no murmurs, rubs, or gallops. No clubbing, cyanosis, edema.  Radials/DP/PT 2+ and equal bilaterally.  Respiratory:  Respirations regular and unlabored, clear to auscultation bilaterally. GI: Soft, nontender, nondistended, BS + x 4. MS: no deformity or atrophy. Skin: warm and dry, no rash. Neuro:  Strength and sensation are intact. Psych: Normal affect.  Accessory Clinical Findings    Recent Labs: 04/10/2022: ALT 31; BUN 12; Creatinine, Ser 0.78; Hemoglobin 15.5; Magnesium 2.1; Platelets 179; Potassium 5.1; Sodium 137; TSH 1.950   Recent Lipid Panel    Component Value Date/Time   CHOL 188 04/10/2022 0844   TRIG 125 04/10/2022 0844   HDL 47 04/10/2022 0844   CHOLHDL 4.0 04/10/2022 0844   CHOLHDL 3.5 08/12/2021 0530   VLDL 9 08/12/2021 0530   LDLCALC 119 (H) 04/10/2022 0844         ECG personally reviewed by me today-sinus rhythm with marked sinus arrhythmia left axis deviation right bundle branch block lateral infarct undetermined age inferior infarct undetermined age 56 bpm- No acute changes  Echocardiogram 01/07/2021  IMPRESSIONS     1. Left ventricular ejection fraction, by estimation, is 60 to 65%. Left  ventricular ejection fraction by 3D volume is 67 %. The left ventricle has  normal function. The left ventricle has no regional wall motion  abnormalities. There is mild-to-moderate  concentric left ventricular hypertrophy. Left ventricular diastolic  parameters are consistent with Grade II  diastolic dysfunction  (pseudonormalization).   2. Right ventricular systolic function is normal. The right ventricular  size is normal.   3. The mitral valve is normal in structure. Trivial mitral valve  regurgitation. No evidence of mitral stenosis.   4. The aortic valve is tricuspid. Aortic valve regurgitation is not  visualized. No aortic stenosis is present.   5. Aortic dilatation noted. There is mild dilatation of the ascending  aorta, measuring 41 mm.   6. The inferior vena cava is dilated in size with <50% respiratory  variability, suggesting right atrial pressure of 15 mmHg.   Comparison(s): No prior Echocardiogram.   FINDINGS   Left Ventricle: Left ventricular ejection fraction, by estimation, is 60  to 65%. Left ventricular ejection fraction by 3D volume is 67 %. The left  ventricle has normal function. The left ventricle has no regional wall  motion abnormalities. The left  ventricular internal cavity size was normal in size. There is  mild-to-moderate concentric left ventricular hypertrophy. Left ventricular  diastolic parameters are consistent with Grade II diastolic dysfunction  (pseudonormalization).   Right Ventricle: The right ventricular size is normal. No increase in  right ventricular wall thickness. Right ventricular systolic function is  normal.   Left Atrium: Left atrial size was normal in size.   Right Atrium: Right atrial size was normal in size.   Pericardium: There is no evidence of pericardial effusion.  Mitral Valve: The mitral valve is normal in structure. There is mild  thickening of the mitral valve leaflet(s). There is mild calcification of  the mitral valve leaflet(s). Mild mitral annular calcification. Trivial  mitral valve regurgitation. No evidence   of mitral valve stenosis.   Tricuspid Valve: The tricuspid valve is normal in structure. Tricuspid  valve regurgitation is trivial.   Aortic Valve: The aortic valve is tricuspid. Aortic  valve regurgitation is  not visualized. No aortic stenosis is present.   Pulmonic Valve: The pulmonic valve was not well visualized. Pulmonic valve  regurgitation is trivial.   Aorta: Aortic dilatation noted. There is mild dilatation of the ascending  aorta, measuring 41 mm.   Venous: The inferior vena cava is dilated in size with less than 50%  respiratory variability, suggesting right atrial pressure of 15 mmHg.   IAS/Shunts: No atrial level shunt detected by color flow Doppler.    Assessment & Plan   1.  Coronary artery disease, DOE-no chest pain today.  Negative treadmill 1/22.  Cardiac catheterization 2009 noted minor CAD.  Denies recent episodes of chest discomfort. Continue Cardizem, losartan, propranolol Heart healthy low-sodium diet-salty 6 given Increase physical activity as tolerated Order CBC, BMP Order echocardiogram  Hyperlipidemia-LDL 119 on 04/10/2022 High-fiber diet Increase physical activity as tolerated Follows with PCP  Essential hypertension-BP today 124/78 Continue current medical therapy Maintain blood pressure log  Paroxysmal atrial flutter-underwent a flutter ablation 04/06/2021.  Status post ILR, interrogated 10/02/2021, device functioning well, battery status okay, A-fib burden 1.6%.  ILR interrogated today by Ralph Mack which showed reassuring results and occasional PVCs.  No true A-fib, pauses or other arrhythmias. Continue diltiazem, propranolol   Obesity-weight today 264 pounds. Continue reduced calorie diet Maintain physical activity as tolerated  History of lung nodule-[place] [address] showed benign nodules that were unchanged from previous study. Follows with PCP   Disposition: Follow-up with Dr. Percival Mack after echocardiogram   Jossie Ng. Trestin Vences NP-C     11/27/2022, 2:28 PM [address] Office [phone] Fax [phone]    I spent 20 minutes examining this patient, reviewing  medications, and using patient centered shared decision making involving her cardiac care.  Prior to her visit I spent greater than 20 minutes reviewing her past medical history,  medications, and prior cardiac tests.

## 2022-11-27 ENCOUNTER — Ambulatory Visit: Payer: BC Managed Care – PPO | Attending: General Practice | Admitting: General Practice

## 2022-11-27 ENCOUNTER — Encounter: Payer: Self-pay | Admitting: General Practice

## 2022-11-27 VITALS — BP 124/78 | HR 65 | Ht 67.0 in | Wt 264.0 lb

## 2022-11-27 DIAGNOSIS — E782 Mixed hyperlipidemia: Secondary | ICD-10-CM

## 2022-11-27 DIAGNOSIS — I4892 Unspecified atrial flutter: Secondary | ICD-10-CM

## 2022-11-27 DIAGNOSIS — Z8679 Personal history of other diseases of the circulatory system: Secondary | ICD-10-CM | POA: Diagnosis not present

## 2022-11-27 DIAGNOSIS — I10 Essential (primary) hypertension: Secondary | ICD-10-CM

## 2022-11-27 DIAGNOSIS — I2510 Atherosclerotic heart disease of native coronary artery without angina pectoris: Secondary | ICD-10-CM

## 2022-11-27 DIAGNOSIS — R0609 Other forms of dyspnea: Secondary | ICD-10-CM

## 2022-11-27 DIAGNOSIS — R911 Solitary pulmonary nodule: Secondary | ICD-10-CM

## 2022-11-27 NOTE — Progress Notes (Signed)
Loop recorder interrogation performed in the office today for recent complaints of palpitations.  Device interrogation does not show any evidence of significant bradycardia or pauses or meaningful ventricular arrhythmia. Multiple episodes labeled by the device as representing "atrial fibrillation" actually represent sinus rhythm or mild sinus tachycardia with frequent premature atrial contractions.  Distinct P waves are seen preceding each QRS complex.  There is no true atrial fibrillation seen.

## 2022-11-27 NOTE — Patient Instructions (Signed)
Medication Instructions:  The current medical regimen is effective;  continue present plan and medications as directed. Please refer to the Current Medication list given to you today.  *If you need a refill on your cardiac medications before your next appointment, please call your pharmacy*  Lab Work: [place] If you have labs (blood work) drawn today and your tests are completely normal, you will receive your results only by: [place] (if you have MyChart) OR  A paper copy in the mail If you have any lab test that is abnormal or we need to change your treatment, we will call you to review the results.  Testing/Procedures: Echocardiogram - Your physician has requested that you have an echocardiogram. Echocardiography is a painless test that uses sound waves to create images of your heart. It provides your doctor with information about the size and shape of your heart and how well your heart's chambers and valves are working. This procedure takes approximately one hour. There are no restrictions for this procedure.    Follow-Up: At [facility], you and your health needs are our priority.  As part of our continuing mission to provide you with exceptional heart care, we have created designated Provider Care Teams.  These Care Teams include your primary Cardiologist (physician) and Advanced Practice Providers (APPs -  Physician Assistants and Nurse Practitioners) who all work together to provide you with the care you need, when you need it.  Your next appointment:   AFTER ECHO   Provider:   Minus Breeding, MD     Other Instructions

## 2022-11-28 ENCOUNTER — Encounter: Payer: Self-pay | Admitting: Nurse Practitioner

## 2022-11-28 LAB — BASIC METABOLIC PANEL
BUN/Creatinine Ratio: 21 — ABNORMAL HIGH (ref 9–20)
BUN: 17 mg/dL (ref 6–24)
CO2: 27 mmol/L (ref 20–29)
Calcium: 9.5 mg/dL (ref 8.7–10.2)
Chloride: 97 mmol/L (ref 96–106)
Creatinine, Ser: 0.82 mg/dL (ref 0.76–1.27)
Glucose: 123 mg/dL — ABNORMAL HIGH (ref 70–99)
Potassium: 4.9 mmol/L (ref 3.5–5.2)
Sodium: 139 mmol/L (ref 134–144)
eGFR: 102 mL/min/{1.73_m2} (ref >59)

## 2022-11-28 LAB — CBC
Hematocrit: 41 % (ref 37.5–51.0)
Hemoglobin: 13.2 g/dL (ref 13.0–17.7)
MCH: 30.5 pg (ref 26.6–33.0)
MCHC: 32.2 g/dL (ref 31.5–35.7)
MCV: 95 fL (ref 79–97)
Platelets: 217 10*3/uL (ref 150–450)
RBC: 4.33 x10E6/uL (ref 4.14–5.80)
RDW: 12.6 % (ref 11.6–15.4)
WBC: 6.3 10*3/uL (ref 3.4–10.8)

## 2022-12-03 ENCOUNTER — Telehealth: Payer: Self-pay | Admitting: General Practice

## 2022-12-03 NOTE — Telephone Encounter (Signed)
Spoke with pt regarding an episode of chest discomfort that happened after driving all night, he had a fish sandwich and spicy tacos and found himself feeling like he needed to burp. Pt tried multiple things to try to burp but was unable. Pt felt run down for the next day and ended up sleeping for over 14 hours. Pt was recently seen by Coletta Memos, NP in regards to this situation. An EKG was done at this visit. Pt does not recall that the EKG that was done was reviewed with him. Pt also messaged in via Mychart in regards to this. Pt saw the EKG and started googling what it said. Pt is alarmed by what he found out via google. Pt would like for Dr. Percival Spanish to review EKG and let him know what it means. Pt verbalizes understanding.

## 2022-12-03 NOTE — Telephone Encounter (Signed)
New Message:       Patient would like for Denyse Amass to call today if possible please. He said if Denyse Amass is not here, he wants someone to please call and explain his EKG today. He wants to know what his EKG  was , when he was in on 11-27-22. He is very concerned about this and wants to know what he needs to do please.

## 2022-12-05 ENCOUNTER — Other Ambulatory Visit: Payer: Self-pay | Admitting: Psychiatry

## 2022-12-05 DIAGNOSIS — F411 Generalized anxiety disorder: Secondary | ICD-10-CM

## 2022-12-06 NOTE — Telephone Encounter (Signed)
Left message for pt to call.

## 2022-12-07 ENCOUNTER — Other Ambulatory Visit: Payer: Self-pay | Admitting: Nurse Practitioner

## 2022-12-07 DIAGNOSIS — I10 Essential (primary) hypertension: Secondary | ICD-10-CM

## 2022-12-08 NOTE — Telephone Encounter (Signed)
Please call to schedule appt..

## 2022-12-09 NOTE — Telephone Encounter (Signed)
Lvm for patient to call and schedule 

## 2022-12-09 NOTE — Telephone Encounter (Signed)
Made appt for 3/21

## 2022-12-10 NOTE — Telephone Encounter (Signed)
Left message for pt to call.

## 2022-12-10 NOTE — Telephone Encounter (Signed)
Spoke with pt, aware of dr hochrein's reading of ECG. He has a follow up appointment 12/19/22.

## 2022-12-12 ENCOUNTER — Ambulatory Visit: Payer: BC Managed Care – PPO | Admitting: Nurse Practitioner

## 2022-12-12 ENCOUNTER — Other Ambulatory Visit: Payer: Self-pay | Admitting: Nurse Practitioner

## 2022-12-12 ENCOUNTER — Encounter: Payer: Self-pay | Admitting: Nurse Practitioner

## 2022-12-12 VITALS — BP 114/73 | HR 72 | Ht 67.0 in | Wt 260.1 lb

## 2022-12-12 DIAGNOSIS — J449 Chronic obstructive pulmonary disease, unspecified: Secondary | ICD-10-CM | POA: Diagnosis not present

## 2022-12-12 DIAGNOSIS — I2510 Atherosclerotic heart disease of native coronary artery without angina pectoris: Secondary | ICD-10-CM

## 2022-12-12 DIAGNOSIS — E039 Hypothyroidism, unspecified: Secondary | ICD-10-CM | POA: Diagnosis not present

## 2022-12-12 DIAGNOSIS — I10 Essential (primary) hypertension: Secondary | ICD-10-CM | POA: Diagnosis not present

## 2022-12-12 NOTE — Progress Notes (Signed)
Established patient visit   Patient: Ralph Mack   DOB: Jul 31, 1966   57 y.o. Male  MRN: [id number] Visit Date: 12/12/2022   Chief Complaint  Patient presents with  . Medical Management of Chronic Issues   Subjective    HPI  Follow up visit mmmn   Medications: Outpatient Medications Prior to Visit  Medication Sig  . albuterol (PROAIR HFA) 108 (90 Base) MCG/ACT inhaler Inhale 2 puffs into the lungs every 4 (four) hours as needed for wheezing or shortness of breath.  . allopurinol (ZYLOPRIM) 300 MG tablet Take 1 tablet by mouth once daily  . aspirin EC 81 MG tablet Take 1 tablet (81 mg total) by mouth daily. Swallow whole. (Patient not taking: Reported on 12/19/2022)  . benzonatate (TESSALON) 200 MG capsule Take 1 capsule by mouth three times daily as needed  . Cholecalciferol (VITAMIN D3) 5000 units TABS 5,000 IU OTC vitamin D3 daily. (Patient taking differently: Take 5,000 Units by mouth daily.)  . dicyclomine (BENTYL) 20 MG tablet TAKE 1 TABLET BY MOUTH 4 TIMES DAILY BEFORE MEAL(S) AND AT BEDTIME AS NEEDED  . diltiazem (CARDIZEM CD) 120 MG 24 hr capsule Take 1 capsule by mouth once daily  . doxycycline (VIBRAMYCIN) 100 MG capsule Take 1 capsule (100 mg total) by mouth 2 (two) times daily. (Patient not taking: Reported on 12/19/2022)  . fexofenadine (ALLEGRA) 180 MG tablet Take 180 mg by mouth daily as needed for allergies.  . fluticasone (FLONASE) 50 MCG/ACT nasal spray Place 2 sprays into the nose daily.  Marland Kitchen ipratropium-albuterol (DUONEB) 0.5-2.5 (3) MG/3ML SOLN TAKE IN NEBULIZER EVERY 6 HOURS AS NEEDED  . Melatonin 10 MG TABS Take 1 tablet by mouth at bedtime as needed.  . mometasone-formoterol (DULERA) 200-5 MCG/ACT AERO INHALE 2 PUFFS TWICE DAILY .  Marland Kitchen predniSONE (STERAPRED UNI-PAK 21 TAB) 10 MG (21) TBPK tablet Take by mouth daily. Take 6 tabs by mouth daily  for 2 days, then 5 tabs for 2 days, then 4 tabs for 2 days, then 3 tabs for 2 days, 2 tabs for 2 days, then 1 tab by  mouth daily for 2 days (Patient not taking: Reported on 12/19/2022)  . propranolol (INDERAL) 20 MG tablet TAKE 1 TABLET BY MOUTH THREE TIMES DAILY  . Sodium Chloride-Sodium Bicarb (AYR SALINE NASAL RINSE NA) Place 1 Dose into the nose daily as needed (congestion).  . Tiotropium Bromide Monohydrate (SPIRIVA RESPIMAT) 2.5 MCG/ACT AERS INHALE 2 SPRAY(S) BY MOUTH ONCE DAILY  . vitamin B-12 (CYANOCOBALAMIN) 100 MCG tablet Take 100 mcg by mouth daily.  . [DISCONTINUED] busPIRone (BUSPAR) 15 MG tablet TAKE 1 TABLET BY MOUTH IN THE MORNING  . [DISCONTINUED] hydrochlorothiazide (MICROZIDE) 12.5 MG capsule Take 1 capsule by mouth once daily  . [DISCONTINUED] levothyroxine (SYNTHROID) 50 MCG tablet Take 1 tablet by mouth once daily  . [DISCONTINUED] losartan (COZAAR) 50 MG tablet TAKE 1 TABLET BY MOUTH ONCE DAILY . APPOINTMENT REQUIRED FOR FUTURE REFILLS  . [DISCONTINUED] tadalafil (CIALIS) 10 MG tablet TAKE 1 TABLET BY MOUTH ONCE DAILY AS NEEDED FOR ERECTILE DYSFUNCTION   No facility-administered medications prior to visit.    Review of Systems  {Labs (Optional):23779}   Objective     Today's Vitals   12/12/22 1108  BP: 114/73  Pulse: 72  SpO2: 90%  Weight: 260 lb 1.9 oz (118 kg)  Height:  (1.702 m)   Body mass index is 40.74 kg/m.  BP Readings from Last 3 Encounters:  12/12/22 114/73  11/27/22 124/78  09/05/22 124/70    Wt Readings from Last 3 Encounters:  12/12/22 260 lb 1.9 oz (118 kg)  11/27/22 264 lb (119.7 kg)  09/05/22 269 lb 6.4 oz (122.2 kg)    Physical Exam  ***  No results found for any visits on 12/12/22.  Assessment & Plan     Problem List Items Addressed This Visit   None    Return in about 6 months (around 06/14/2023) for health maintenance exam, FBW a week prior to visit.         Carlean Jews, NP  [facility] [phone] (phone) [phone] (fax)  [facility]

## 2022-12-17 ENCOUNTER — Ambulatory Visit: Payer: BC Managed Care – PPO | Admitting: Cardiology

## 2022-12-19 ENCOUNTER — Other Ambulatory Visit: Payer: Self-pay | Admitting: Cardiology

## 2022-12-19 ENCOUNTER — Ambulatory Visit: Payer: BC Managed Care – PPO | Admitting: Cardiology

## 2022-12-19 ENCOUNTER — Other Ambulatory Visit: Payer: Self-pay | Admitting: Adult Health

## 2022-12-19 ENCOUNTER — Ambulatory Visit: Payer: BC Managed Care – PPO | Admitting: Psychiatry

## 2022-12-19 ENCOUNTER — Encounter: Payer: Self-pay | Admitting: Psychiatry

## 2022-12-19 DIAGNOSIS — F411 Generalized anxiety disorder: Secondary | ICD-10-CM | POA: Diagnosis not present

## 2022-12-19 MED ORDER — BUSPIRONE HCL 15 MG PO TABS: 15.0000 mg | ORAL_TABLET | Freq: Every morning | ORAL | 3 refills | Status: DC

## 2022-12-19 NOTE — Progress Notes (Signed)
Tighe Gitto [id number] 26-Jan-1966 57 y.o.  Subjective:   Patient ID:  Ralph Mack is a 57 y.o. (DOB [birth year]) male.  Chief Complaint:  Chief Complaint  Patient presents with   Follow-up    Anxiety    HPI Ralph Mack presents to the office today for follow-up of anxiety.   He reports that he was in the hospital about a month ago. He has been seeing cardiology for a work up. He reports that he has gained 67 lbs since he quit smoking in 2022.   He reports that he is now working out of another office. He reports that he is about to switch from night shift to day shift and is looking forward to this.   He reports that he has had less stress since switching to working out of another office. He reports minimal anxiety. 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 Denies any recent panic attacks. He reports that his sleep varies with working nights. He anticipates improved sleep with returning to day shift. Denies depressed mood. Motivation is good. He reports that his energy is sometimes limited due to breathing. He reports chronic difficulty with concentration. Denies SI.   Close friend recently died of cancer.    Past Psychiatric Medication Trials: Prozac-ineffective Sertraline Alprazolam- effective Propranolol-ineffective Buspar  GAD-7    Flowsheet Row Office Visit from 04/18/2022 in [place] at [facility] Visit from 12/26/2021 in [place] at [place] Visit from 04/24/2021 in [place] at [place] Visit from 03/22/2019 in [place] at [facility] Visit from 01/06/2019 in [place] at [facility]  Total GAD-7 Score 0 0 0 0 1      [place] Office Visit from 04/18/2022 in [place] at [facility] Visit from 12/26/2021 in [place] at [place] Visit from 04/24/2021 in [place] at [facility] Visit from 01/30/2021 in [place] at [facility] Visit from 01/25/2021 in [place] at [facility]  PHQ-2 Total Score 0 0 0 0 0  PHQ-9 Total Score 1 1 0 0 2      [place] ED from 08/08/2022 in [facility] at [facility] ED from 05/30/2022 in [facility]) ED to Hosp-Admission (Discharged) from 08/11/2021 in [facility] UN[facility]  C-SSRS RISK CATEGORY No Risk No Risk No Risk        Review of Systems:  Review of Systems  Respiratory:  Positive for shortness of breath.   Musculoskeletal:  Negative for gait problem.  Allergic/Immunologic: Positive for environmental allergies.  Psychiatric/Behavioral:         Please refer to HPI    Medications: I have reviewed the patient's current medications.  Current Outpatient Medications  Medication Sig Dispense Refill   allopurinol (ZYLOPRIM) 300 MG tablet Take 1 tablet by mouth once daily 90 tablet 1   Cholecalciferol (VITAMIN D3) 5000 units TABS 5,000 IU OTC vitamin D3 daily. (Patient taking differently: Take 5,000 Units by mouth daily.) 90 tablet 3   dicyclomine (BENTYL) 20 MG tablet TAKE 1 TABLET BY MOUTH 4  TIMES DAILY BEFORE MEAL(S) AND AT BEDTIME AS NEEDED 360 tablet 0   diltiazem (CARDIZEM CD) 120 MG 24 hr capsule Take 1 capsule by mouth once daily 90 capsule 0   fexofenadine (ALLEGRA) 180 MG tablet Take 180 mg by mouth daily as needed for allergies.     fluticasone (FLONASE) 50 MCG/ACT nasal spray Place 2 sprays into the nose daily.     hydrochlorothiazide (MICROZIDE) 12.5 MG capsule Take 1 capsule by mouth once daily 90 capsule 1   levothyroxine (SYNTHROID) 50 MCG tablet Take 1 tablet by mouth once daily 90 tablet 1   losartan (COZAAR) 50 MG tablet TAKE 1  TABLET BY MOUTH ONCE DAILY . APPOINTMENT REQUIRED FOR FUTURE REFILLS 90 tablet 1   Melatonin 10 MG TABS Take 1 tablet by mouth at bedtime as needed.     mometasone-formoterol (DULERA) 200-5 MCG/ACT AERO INHALE 2 PUFFS TWICE DAILY . 13 g 5   propranolol (INDERAL) 20 MG tablet TAKE 1 TABLET BY MOUTH THREE TIMES DAILY 270 tablet 0   Tiotropium Bromide Monohydrate (SPIRIVA RESPIMAT) 2.5 MCG/ACT AERS INHALE 2 SPRAY(S) BY MOUTH ONCE DAILY 4 g 5   vitamin B-12 (CYANOCOBALAMIN) 100 MCG tablet Take 100 mcg by mouth daily.     albuterol (PROAIR HFA) 108 (90 Base) MCG/ACT inhaler Inhale 2 puffs into the lungs every 4 (four) hours as needed for wheezing or shortness of breath. 18 g 3   aspirin EC 81 MG tablet Take 1 tablet (81 mg total) by mouth daily. Swallow whole. (Patient not taking: Reported on 12/19/2022) 90 tablet 3   benzonatate (TESSALON) 200 MG capsule Take 1 capsule by mouth three times daily as needed 45 capsule 0   busPIRone (BUSPAR) 15 MG tablet Take 1 tablet (15 mg total) by mouth every morning. 90 tablet 3   doxycycline (VIBRAMYCIN) 100 MG capsule Take 1 capsule (100 mg total) by mouth 2 (two) times daily. (Patient not taking: Reported on 12/19/2022) 20 capsule 0   ipratropium-albuterol (DUONEB) 0.5-2.5 (3) MG/3ML SOLN TAKE 3ML IN NEBULIZER EVERY 6 HOURS AS NEEDED 120 mL 2   predniSONE (STERAPRED UNI-PAK 21 TAB) 10 MG (21) TBPK tablet Take by mouth daily. Take 6 tabs by mouth daily  for 2 days, then 5 tabs for 2 days, then 4 tabs for 2 days, then 3 tabs for 2 days, 2 tabs for 2 days, then 1 tab by mouth daily for 2 days (Patient not taking: Reported on 12/19/2022) 42 tablet 0   Sodium Chloride-Sodium Bicarb (AYR SALINE NASAL RINSE NA) Place 1 Dose into the nose daily as needed (congestion).     tadalafil (CIALIS) 10 MG tablet TAKE 1 TABLET BY MOUTH ONCE DAILY AS NEEDED FOR ERECTILE DYSFUNCTION 10 tablet 0   No current facility-administered medications for this visit.    Medication Side Effects:  Other: Occ feels slightly dizzy after taking Buspar  Allergies: No Known Allergies  Past Medical History:  Diagnosis Date   Acute on chronic respiratory failure ([place]) 01/06/2021   Walk Test 02/20/21- desat to 85% during second lap with HR 90. Improved to 95% on 2L. Patient denied shortness of breath during walk.   Allergy    Arthritis    COPD (chronic obstructive pulmonary disease) (HCC)    Hx of colonic polyp 11/13/2016   10/2016 7 mm ssp/a - recall 2023   Hypertension    Hypothyroidism    IBS (irritable bowel syndrome)    Lymphocytic colitis 04/19/2019   Mild CAD 2009  Obesity    OSA (obstructive sleep apnea) 04/20/2021   Paroxysmal atrial flutter (HCC)    Prediabetes    Recurrent upper respiratory infection (URI)     Past Medical History, Surgical history, Social history, and Family history were reviewed and updated as appropriate.   Please see review of systems for further details on the patient's review from today.   Objective:   Physical Exam:  There were no vitals taken for this visit.  Physical Exam Constitutional:      General: He is not in acute distress. Musculoskeletal:        General: No deformity.  Neurological:     Mental Status: He is alert and oriented to person, place, and time.     Coordination: Coordination normal.  Psychiatric:        Attention and Perception: Attention and perception normal. He does not perceive auditory or visual hallucinations.        Mood and Affect: Mood normal. Mood is not anxious or depressed. Affect is not labile, blunt, angry or inappropriate.        Speech: Speech normal.        Behavior: Behavior normal.        Thought Content: Thought content normal. Thought content is not paranoid or delusional. Thought content does not include homicidal or suicidal ideation. Thought content does not include homicidal or suicidal plan.        Cognition and Memory: Cognition and memory normal.        Judgment: Judgment normal.      Comments: Insight intact     Lab Review:     Component Value Date/Time   NA 139 11/27/2022 1445   K 4.9 11/27/2022 1445   CL 97 11/27/2022 1445   CO2 27 11/27/2022 1445   GLUCOSE 123 (H) 11/27/2022 1445   GLUCOSE 132 (H) 08/13/2021 0504   BUN 17 11/27/2022 1445   CREATININE 0.82 11/27/2022 1445   CALCIUM 9.5 11/27/2022 1445   PROT 6.7 04/10/2022 0844   ALBUMIN 4.4 04/10/2022 0844   AST 19 04/10/2022 0844   ALT 31 04/10/2022 0844   ALKPHOS 72 04/10/2022 0844   BILITOT 0.5 04/10/2022 0844   GFRNONAA >60 08/13/2021 0504   GFRAA 121 08/07/2020 1100       Component Value Date/Time   WBC 6.3 11/27/2022 1445   WBC 14.8 (H) 08/13/2021 0504   RBC 4.33 11/27/2022 1445   RBC 3.70 (L) 08/13/2021 0504   HGB 13.2 11/27/2022 1445   HCT 41.0 11/27/2022 1445   PLT 217 11/27/2022 1445   MCV 95 11/27/2022 1445   MCH 30.5 11/27/2022 1445   MCH 26.5 08/13/2021 0504   MCHC 32.2 11/27/2022 1445   MCHC 30.2 08/13/2021 0504   RDW 12.6 11/27/2022 1445   LYMPHSABS 1.5 08/11/2021 1628   LYMPHSABS 1.7 03/23/2021 1046   MONOABS 0.9 08/11/2021 1628   EOSABS 0.4 08/11/2021 1628   EOSABS 0.3 03/23/2021 1046   BASOSABS 0.1 08/11/2021 1628   BASOSABS 0.1 03/23/2021 1046    No results found for: "POCLITH", "LITHIUM"   No results found for: "PHENYTOIN", "PHENOBARB", "VALPROATE", "CBMZ"   .res Assessment: Plan:    Pt seen for 30 minutes and time spent discussing continuing Buspar long-term or decreasing and then discontinuing Buspar. Discussed that he could attempt dose reduction to 1/2 tab daily for a few weeks before discontinuing Buspar since he has not had any recent acute anxiety symptoms. Recommended waiting until after transitioning from night shift to  day shift before making changes in medication. He reports that he may continue Buspar 15 mg daily since anxiety has been well controlled. Advised pt to contact office if he has any questions or worsening symptoms.  Pt to follow-up in one  year or sooner if clinically indicated. Patient advised to contact office with any questions, adverse effects, or acute worsening in signs and symptoms.   Ralph Mack was seen today for follow-up.  Diagnoses and all orders for this visit:  Generalized anxiety disorder -     busPIRone (BUSPAR) 15 MG tablet; Take 1 tablet (15 mg total) by mouth every morning.     Please see After Visit Summary for patient specific instructions.  Future Appointments  Date Time Provider [place]  12/26/2022 11:15 AM [facility] ECHO 3 [id number]  01/23/2023  9:20 AM Minus Breeding, MD [facility] None  06/09/2023  9:00 AM [facility] [place] [facility] None  06/19/2023  9:10 AM Boscia, Greer Ee, NP [facility] None    No orders of the defined types were placed in this encounter.   -------------------------------

## 2022-12-19 NOTE — Telephone Encounter (Signed)
Please advise on refill request

## 2022-12-26 ENCOUNTER — Other Ambulatory Visit: Payer: Self-pay | Admitting: Nurse Practitioner

## 2022-12-26 ENCOUNTER — Ambulatory Visit (HOSPITAL_COMMUNITY): Payer: BC Managed Care – PPO | Attending: General Practice

## 2022-12-26 ENCOUNTER — Encounter (HOSPITAL_COMMUNITY): Payer: Self-pay

## 2022-12-26 DIAGNOSIS — I10 Essential (primary) hypertension: Secondary | ICD-10-CM

## 2023-01-03 ENCOUNTER — Ambulatory Visit (HOSPITAL_COMMUNITY): Payer: BC Managed Care – PPO | Attending: General Practice

## 2023-01-03 DIAGNOSIS — I2510 Atherosclerotic heart disease of native coronary artery without angina pectoris: Secondary | ICD-10-CM | POA: Diagnosis not present

## 2023-01-03 MED ORDER — PERFLUTREN LIPID MICROSPHERE
3.0000 mL | INTRAVENOUS | Status: AC | PRN
  Administered 2023-01-03: 3 mL via INTRAVENOUS

## 2023-01-05 LAB — ECHOCARDIOGRAM COMPLETE
Area-P 1/2: 2.96 cm2
S' Lateral: 3.5 cm

## 2023-01-08 ENCOUNTER — Other Ambulatory Visit: Payer: Self-pay

## 2023-01-08 DIAGNOSIS — I2510 Atherosclerotic heart disease of native coronary artery without angina pectoris: Secondary | ICD-10-CM

## 2023-01-14 ENCOUNTER — Telehealth: Payer: Self-pay | Admitting: Cardiology

## 2023-01-14 NOTE — Telephone Encounter (Signed)
Patient aware of echo results. Will keep appointment with provider to discuss

## 2023-01-14 NOTE — Telephone Encounter (Signed)
Patient is requesting a call back to discuss echo results. 

## 2023-01-15 ENCOUNTER — Ambulatory Visit: Payer: BC Managed Care – PPO | Admitting: Cardiology

## 2023-01-19 NOTE — Assessment & Plan Note (Signed)
-  recent thyroid panel stable. Continue levothyroxine as prescribed

## 2023-01-19 NOTE — Assessment & Plan Note (Signed)
Following with cardiology. 

## 2023-01-19 NOTE — Assessment & Plan Note (Signed)
-  Controlled. -Continue current medication regimen. -Recommend to increase water hydration. -Will continue to monitor. 

## 2023-01-19 NOTE — Assessment & Plan Note (Signed)
-   He is doing quit well. Denies dyspnea or residual respiratory symptoms. He has a reactive cough when outside doing yard work. He quit smoking in April 2022, continue to encourage abstinence  - Pulmonary function testing 12/12/2022 showed minimal obstructive defect consistet with stage 1 COPD. Because he is mostly asymptotic would not recommend regular maintenance inhaler.

## 2023-01-23 ENCOUNTER — Ambulatory Visit: Payer: BC Managed Care – PPO | Admitting: Cardiology

## 2023-01-30 ENCOUNTER — Other Ambulatory Visit: Payer: Self-pay | Admitting: Internal Medicine

## 2023-02-02 ENCOUNTER — Other Ambulatory Visit: Payer: Self-pay | Admitting: Cardiology

## 2023-02-05 ENCOUNTER — Other Ambulatory Visit: Payer: Self-pay | Admitting: Cardiology

## 2023-02-22 NOTE — Progress Notes (Unsigned)
Cardiology Clinic Note   Date: 02/25/2023 ID: Ralph Mack, DOB 12/28/1965, MRN [id number]  Primary Cardiologist:  Rollene Rotunda, MD  Patient Profile    Ralph Mack is a 57 y.o. male who presents to the clinic today for follow-up after testing.  Past medical history significant for: Nonobstructive CAD. LHC performed in [place] 2009: Nonobstructive CAD. Typical a-flutter. 14-day ZIO 10/21/2018: Brief episodes of a-flutter. S/p a-flutter ablation 04/06/2021. Loop recorder implantation 05/04/2021. In office device interrogation by Dr. Royann Mack 11/27/2022: "Device interrogation does not show any evidence of significant bradycardia or pauses or meaningful ventricular arrhythmia.Multiple episodes labeled by the device as representing "atrial fibrillation" actually represent sinus rhythm or mild sinus tachycardia with frequent premature atrial contractions.  Distinct P waves are seen preceding each QRS complex.  There is no true atrial fibrillation seen." DOE. Echo 01/03/2023: EF 55 to 60%.  Mild LVH.  Grade I DD.  Normal RV function.  Mild LAE.  Moderate MAC.  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 Hypertension. Hyperlipidemia/hypertriglyceridemia. Lipid panel 04/10/2022: LDL 119, HDL 47, TG 125, total 188. COPD. OSA. Hypothyroidism. Prediabetes. Tobacco abuse. Complete cessation April 2022. Alcohol use.   History of Present Illness    Ralph Mack was first evaluated by Dr. Antoine Mack on 09/11/2018 for difficult to control hypertension and chest pain as requested by Dr. Sharee Mack.  Exercise stress test showed normal BP response to exercise with no ST segment deviation noted during stress.  At follow-up visit in January 2020 patient complained of palpitations and underwent 14-day ZIO which showed brief episodes of a-flutter.  Patient underwent hospitalization 01/06/2021 to 01/10/2021 for a-flutter.  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  He was  referred to EP for discussion of possible watchman.  He underwent a flutter ablation in July 2022.  He had implantable loop recorder placed in August 2022 for surveillance of A-fib.  Patient continues to be followed by Dr. Antoine Mack for the above outlined history.  Patient was last seen in the office by Ralph Fabian, NP on 11/27/2022 for follow-up.  He reported an isolated episode of chest discomfort after eating a fish sandwich and 2 spicy tacos.  Tums and sodium bicarbonate did not help but he was able to go to sleep.  Interrogation of loop recorder as detailed above.  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  Echo showed normal LV/RV function with Grade I DD and mild LAE (details above).  Today, patient is here alone.  He reports he is doing well. Patient denies shortness of breath or dyspnea on exertion.  He has been doing a lot of work outside without any difficulty this past week as he has been off work. No chest pain, pressure, or tightness. Denies lower extremity edema, orthopnea, or PND. No palpitations.  He has a chronic cough for which she is followed by pulmonary.  He requests a refill of Tessalon.  Discussed recent echo in detail.     ROS: All other systems reviewed and are otherwise negative except as noted in History of Present Illness.  Studies Reviewed    ECG is not ordered today.         Physical Exam    VS:  BP 112/74   Pulse 60   Ht 5\' 7"  (1.702 m)   Wt 263 lb 12.8 oz (119.7 kg)   SpO2 90%   BMI 41.32 kg/m  , BMI Body mass index is 41.32 kg/m.  GEN: Well nourished,  well developed, in no acute distress. Neck: No JVD or carotid bruits. Cardiac:  RRR. No murmurs. No rubs or gallops.   Respiratory:  Respirations regular and unlabored. Clear to auscultation without rales, wheezing or rhonchi. GI: Soft, nontender, nondistended. Extremities: Radials/DP/PT 2+ and equal bilaterally. No clubbing or cyanosis. No edema.  Skin: Warm and dry,  no rash. Neuro: Strength intact.  Assessment & Plan    Nonobstructive CAD.  Heart cath performed in [place] showed nonobstructive CAD.  Patient denies chest pain, pressure, tightness.  He is very active with yard work particularly this past week as he has been off from his job.  Continue aspirin, propranolol. DOE.  Echo April 2024 showed normal LV/RV function, Grade I DD, mild LVH, mild LAE.  Patient denies any further episodes of dyspnea.  He has been doing a lot of heavy yard work this past week without any difficulty.  Discussed details of echo with patient. Typical a-flutter.  S/p a-flutter ablation July 2022.  Loop recorder implantation August 2022 with last interrogation in the office in February 2024 which did not show A-fib.  RRR on exam today.  Patient denies palpitations.  Continue propranolol. Hypertension. BP today 112/74.  Patient denies headaches, dizziness or vision changes. Continue propranolol, losartan, hydrochlorothiazide, Cardizem. Hyperlipidemia.  LDL July 2023 119.  Followed by PCP.  Disposition: Return in 6 months or sooner as needed.         Signed, Ralph Mack. Ralph Kelty, DNP, NP-C

## 2023-02-25 ENCOUNTER — Encounter: Payer: Self-pay | Admitting: Student

## 2023-02-25 ENCOUNTER — Encounter: Payer: Self-pay | Admitting: Nurse Practitioner

## 2023-02-25 ENCOUNTER — Ambulatory Visit: Payer: BC Managed Care – PPO | Attending: Cardiology | Admitting: Student

## 2023-02-25 VITALS — BP 112/74 | HR 60 | Ht 67.0 in | Wt 263.8 lb

## 2023-02-25 DIAGNOSIS — I10 Essential (primary) hypertension: Secondary | ICD-10-CM | POA: Diagnosis not present

## 2023-02-25 DIAGNOSIS — I2510 Atherosclerotic heart disease of native coronary artery without angina pectoris: Secondary | ICD-10-CM

## 2023-02-25 DIAGNOSIS — R0609 Other forms of dyspnea: Secondary | ICD-10-CM

## 2023-02-25 DIAGNOSIS — E785 Hyperlipidemia, unspecified: Secondary | ICD-10-CM

## 2023-02-25 DIAGNOSIS — I483 Typical atrial flutter: Secondary | ICD-10-CM | POA: Diagnosis not present

## 2023-02-25 MED ORDER — BENZONATATE 200 MG PO CAPS: ORAL_CAPSULE | ORAL | 0 refills | Status: DC

## 2023-02-25 NOTE — Patient Instructions (Signed)
Medication Instructions:  Your physician has recommended you make the following change in your medication:  START: Tessalon Pearls 3 times a day as needed for the next 2 weeks. *If you need a refill on your cardiac medications before your next appointment, please call your pharmacy*   Lab Work: NONE If you have labs (blood work) drawn today and your tests are completely normal, you will receive your results only by: MyChart Message (if you have MyChart) OR A paper copy in the mail If you have any lab test that is abnormal or we need to change your treatment, we will call you to review the results.   Testing/Procedures: NONE   Follow-Up: At [facility], you and your health needs are our priority.  As part of our continuing mission to provide you with exceptional heart care, we have created designated Provider Care Teams.  These Care Teams include your primary Cardiologist (physician) and Advanced Practice Providers (APPs -  Physician Assistants and Nurse Practitioners) who all work together to provide you with the care you need, when you need it.  We recommend signing up for the patient portal called "MyChart".  Sign up information is provided on this After Visit Summary.  MyChart is used to connect with patients for Virtual Visits (Telemedicine).  Patients are able to view lab/test results, encounter notes, upcoming appointments, etc.  Non-urgent messages can be sent to your provider as well.   To learn more about what you can do with MyChart, go to [email or web address].    Your next appointment:   6 month(s)  Provider:   Rollene Rotunda, MD

## 2023-02-26 ENCOUNTER — Other Ambulatory Visit: Payer: Self-pay | Admitting: Nurse Practitioner

## 2023-02-26 DIAGNOSIS — N529 Male erectile dysfunction, unspecified: Secondary | ICD-10-CM

## 2023-02-26 MED ORDER — SILDENAFIL CITRATE 50 MG PO TABS
50.0000 mg | ORAL_TABLET | Freq: Every day | ORAL | 3 refills | Status: DC | PRN
Start: 2023-02-26 — End: 2023-06-24

## 2023-03-20 ENCOUNTER — Other Ambulatory Visit: Payer: Self-pay | Admitting: Nurse Practitioner

## 2023-03-20 DIAGNOSIS — I10 Essential (primary) hypertension: Secondary | ICD-10-CM

## 2023-04-02 ENCOUNTER — Other Ambulatory Visit: Payer: Self-pay | Admitting: Nurse Practitioner

## 2023-04-02 DIAGNOSIS — E039 Hypothyroidism, unspecified: Secondary | ICD-10-CM

## 2023-04-06 ENCOUNTER — Other Ambulatory Visit: Payer: Self-pay | Admitting: Internal Medicine

## 2023-05-13 ENCOUNTER — Other Ambulatory Visit: Payer: Self-pay | Admitting: Nurse Practitioner

## 2023-05-13 DIAGNOSIS — M1000 Idiopathic gout, unspecified site: Secondary | ICD-10-CM

## 2023-06-04 ENCOUNTER — Other Ambulatory Visit: Payer: Self-pay

## 2023-06-04 DIAGNOSIS — Z0000 Encounter for general adult medical examination without abnormal findings: Secondary | ICD-10-CM

## 2023-06-04 DIAGNOSIS — R7303 Prediabetes: Secondary | ICD-10-CM

## 2023-06-04 DIAGNOSIS — R5383 Other fatigue: Secondary | ICD-10-CM

## 2023-06-04 DIAGNOSIS — I10 Essential (primary) hypertension: Secondary | ICD-10-CM

## 2023-06-04 DIAGNOSIS — E039 Hypothyroidism, unspecified: Secondary | ICD-10-CM

## 2023-06-09 ENCOUNTER — Encounter: Payer: Self-pay | Admitting: Family Medicine

## 2023-06-09 ENCOUNTER — Other Ambulatory Visit: Payer: BC Managed Care – PPO

## 2023-06-10 ENCOUNTER — Other Ambulatory Visit: Payer: BC Managed Care – PPO

## 2023-06-10 DIAGNOSIS — I10 Essential (primary) hypertension: Secondary | ICD-10-CM

## 2023-06-10 DIAGNOSIS — E039 Hypothyroidism, unspecified: Secondary | ICD-10-CM | POA: Diagnosis not present

## 2023-06-10 DIAGNOSIS — R7303 Prediabetes: Secondary | ICD-10-CM | POA: Diagnosis not present

## 2023-06-10 DIAGNOSIS — Z0000 Encounter for general adult medical examination without abnormal findings: Secondary | ICD-10-CM | POA: Diagnosis not present

## 2023-06-10 DIAGNOSIS — R5383 Other fatigue: Secondary | ICD-10-CM

## 2023-06-11 LAB — LIPID PANEL
Chol/HDL Ratio: 4.2 ratio (ref 0.0–5.0)
Cholesterol, Total: 171 mg/dL (ref 100–199)
HDL: 41 mg/dL (ref >39)
LDL Chol Calc (NIH): 96 mg/dL (ref 0–99)
Triglycerides: 197 mg/dL — ABNORMAL HIGH (ref 0–149)
VLDL Cholesterol Cal: 34 mg/dL (ref 5–40)

## 2023-06-11 LAB — HEMOGLOBIN A1C
Est. average glucose Bld gHb Est-mCnc: 120 mg/dL
Hgb A1c MFr Bld: 5.8 % — ABNORMAL HIGH (ref 4.8–5.6)

## 2023-06-11 LAB — COMPREHENSIVE METABOLIC PANEL
ALT: 21 IU/L (ref 0–44)
AST: 18 IU/L (ref 0–40)
Albumin: 4.2 g/dL (ref 3.8–4.9)
Alkaline Phosphatase: 69 IU/L (ref 44–121)
BUN/Creatinine Ratio: 16 (ref 9–20)
BUN: 13 mg/dL (ref 6–24)
Bilirubin Total: <0.2 mg/dL (ref 0.0–1.2)
CO2: 27 mmol/L (ref 20–29)
Calcium: 9.3 mg/dL (ref 8.7–10.2)
Chloride: 99 mmol/L (ref 96–106)
Creatinine, Ser: 0.83 mg/dL (ref 0.76–1.27)
Globulin, Total: 2.2 g/dL (ref 1.5–4.5)
Glucose: 94 mg/dL (ref 70–99)
Potassium: 4.2 mmol/L (ref 3.5–5.2)
Sodium: 139 mmol/L (ref 134–144)
Total Protein: 6.4 g/dL (ref 6.0–8.5)
eGFR: 101 mL/min/{1.73_m2} (ref >59)

## 2023-06-11 LAB — CBC WITH DIFFERENTIAL/PLATELET
Basophils Absolute: 0.1 10*3/uL (ref 0.0–0.2)
Basos: 1 %
EOS (ABSOLUTE): 0.3 10*3/uL (ref 0.0–0.4)
Eos: 5 %
Hematocrit: 44.4 % (ref 37.5–51.0)
Hemoglobin: 14.3 g/dL (ref 13.0–17.7)
Immature Grans (Abs): 0 10*3/uL (ref 0.0–0.1)
Immature Granulocytes: 1 %
Lymphocytes Absolute: 1.7 10*3/uL (ref 0.7–3.1)
Lymphs: 31 %
MCH: 31.8 pg (ref 26.6–33.0)
MCHC: 32.2 g/dL (ref 31.5–35.7)
MCV: 99 fL — ABNORMAL HIGH (ref 79–97)
Monocytes Absolute: 0.6 10*3/uL (ref 0.1–0.9)
Monocytes: 10 %
Neutrophils Absolute: 3 10*3/uL (ref 1.4–7.0)
Neutrophils: 52 %
Platelets: 178 10*3/uL (ref 150–450)
RBC: 4.49 x10E6/uL (ref 4.14–5.80)
RDW: 12.3 % (ref 11.6–15.4)
WBC: 5.6 10*3/uL (ref 3.4–10.8)

## 2023-06-11 LAB — TSH: TSH: 2.77 u[IU]/mL (ref 0.450–4.500)

## 2023-06-19 ENCOUNTER — Ambulatory Visit: Payer: BC Managed Care – PPO | Admitting: Family Medicine

## 2023-06-19 ENCOUNTER — Ambulatory Visit: Payer: BC Managed Care – PPO | Admitting: Nurse Practitioner

## 2023-06-24 ENCOUNTER — Encounter: Payer: Self-pay | Admitting: Family Medicine

## 2023-06-24 ENCOUNTER — Ambulatory Visit (INDEPENDENT_AMBULATORY_CARE_PROVIDER_SITE_OTHER): Payer: BC Managed Care – PPO | Admitting: Family Medicine

## 2023-06-24 VITALS — BP 117/77 | HR 59 | Resp 18 | Ht 67.0 in | Wt 265.0 lb

## 2023-06-24 DIAGNOSIS — R7303 Prediabetes: Secondary | ICD-10-CM

## 2023-06-24 DIAGNOSIS — N529 Male erectile dysfunction, unspecified: Secondary | ICD-10-CM

## 2023-06-24 DIAGNOSIS — E039 Hypothyroidism, unspecified: Secondary | ICD-10-CM

## 2023-06-24 DIAGNOSIS — J441 Chronic obstructive pulmonary disease with (acute) exacerbation: Secondary | ICD-10-CM

## 2023-06-24 DIAGNOSIS — Z0000 Encounter for general adult medical examination without abnormal findings: Secondary | ICD-10-CM

## 2023-06-24 DIAGNOSIS — I2510 Atherosclerotic heart disease of native coronary artery without angina pectoris: Secondary | ICD-10-CM

## 2023-06-24 DIAGNOSIS — E782 Mixed hyperlipidemia: Secondary | ICD-10-CM | POA: Diagnosis not present

## 2023-06-24 DIAGNOSIS — I10 Essential (primary) hypertension: Secondary | ICD-10-CM

## 2023-06-24 MED ORDER — PROPRANOLOL HCL 20 MG PO TABS
20.0000 mg | ORAL_TABLET | Freq: Three times a day (TID) | ORAL | 3 refills | Status: DC
Start: 2023-06-24 — End: 2024-08-19

## 2023-06-24 MED ORDER — IPRATROPIUM-ALBUTEROL 0.5-2.5 (3) MG/3ML IN SOLN: 3.0000 mL | Freq: Four times a day (QID) | RESPIRATORY_TRACT | 2 refills | Status: DC | PRN

## 2023-06-24 MED ORDER — SILDENAFIL CITRATE 50 MG PO TABS
50.0000 mg | ORAL_TABLET | Freq: Every day | ORAL | 3 refills | Status: DC | PRN
Start: 2023-06-24 — End: 2024-07-19

## 2023-06-24 MED ORDER — LEVOTHYROXINE SODIUM 50 MCG PO TABS: 50.0000 ug | ORAL_TABLET | Freq: Every day | ORAL | 3 refills | Status: DC

## 2023-06-24 MED ORDER — LOSARTAN POTASSIUM 50 MG PO TABS
ORAL_TABLET | ORAL | 3 refills | Status: DC
Start: 2023-06-24 — End: 2024-04-20

## 2023-06-24 MED ORDER — ROSUVASTATIN CALCIUM 5 MG PO TABS
5.0000 mg | ORAL_TABLET | Freq: Every day | ORAL | 3 refills | Status: DC
Start: 2023-06-24 — End: 2024-02-25

## 2023-06-24 MED ORDER — HYDROCHLOROTHIAZIDE 12.5 MG PO CAPS
ORAL_CAPSULE | ORAL | 3 refills | Status: DC
Start: 2023-06-24 — End: 2024-06-30

## 2023-06-24 NOTE — Assessment & Plan Note (Addendum)
Last lipid panel: LDL 96, HDL 41, triglycerides 197.  Triglycerides above goal. The 10-year ASCVD risk score (Arnett DK, et al., 2019) is: 7.9%, he does have a history of CAD.  He is agreeable to a trial of a low-dose of rosuvastatin 5 mg daily.

## 2023-06-24 NOTE — Assessment & Plan Note (Signed)
TSH within normal limits.  Continue levothyroxine 50 mcg daily.

## 2023-06-24 NOTE — Assessment & Plan Note (Signed)
BP goal <130/80.  Stable, at goal.  Continue propranolol 20 mg 3 times daily, losartan 50 mg daily, hydrochlorothiazide 12.5 mg daily.  CMP within normal limits.  Will continue to monitor.

## 2023-06-24 NOTE — Assessment & Plan Note (Signed)
Continue ipratropium-albuterol nebulizer treatment for acute exacerbations of COPD.

## 2023-06-24 NOTE — Progress Notes (Signed)
Complete physical exam  Patient: Ralph Mack   DOB: Oct 23, 1965   57 y.o. Male  MRN: [id number]  Subjective:    Chief Complaint  Patient presents with   Annual Exam    Ralph Mack is a 56 y.o. male who presents today for a complete physical exam. He reports consuming a general diet. He generally feels well. He reports sleeping well. He does not have additional problems to discuss today.  He has 4 dogs that keep him and his wife very busy.   Most recent fall risk assessment:    12/12/2022   11:11 AM  Fall Risk   Falls in the past year? 0  Number falls in past yr: 0  Injury with Fall? 0  Follow up Falls evaluation completed     Most recent depression and anxiety screenings:    06/24/2023    1:14 PM 04/18/2022    2:58 PM  PHQ 2/9 Scores  PHQ - 2 Score 0 0  PHQ- 9 Score 0 1      06/24/2023    1:14 PM 04/18/2022    2:58 PM 12/26/2021    1:15 PM 04/24/2021    9:48 AM  GAD 7 : Generalized Anxiety Score  Nervous, Anxious, on Edge 0 0 0 0  Control/stop worrying 0 0 0 0  Worry too much - different things 0 0 0 0  Trouble relaxing 0 0 0 0  Restless 0 0 0 0  Easily annoyed or irritable 0 0 0 0  Afraid - awful might happen 0 0 0 0  Total GAD 7 Score 0 0 0 0  Anxiety Difficulty Not difficult at all       Patient Active Problem List   Diagnosis Date Noted   Chronic respiratory failure with hypoxia (HCC) 08/17/2021   OSA (obstructive sleep apnea) 04/20/2021   Multiple pulmonary nodules 03/08/2021   Typical atrial flutter (HCC) 02/08/2021   COPD (chronic obstructive pulmonary disease) (HCC) 12/18/2020   CAD (coronary artery disease) 02/08/2020   Orthostatic lightheadedness 05/25/2019   Lymphocytic colitis 04/19/2019   Generalized anxiety disorder 10/05/2018   Essential hypertension 09/02/2018   Low testosterone in male 08/17/2018   White coat syndrome without diagnosis of hypertension 10/14/2017   Hx of colonic polyp 11/13/2016   Hypothyroidism 10/24/2016   OAB  (overactive bladder) 10/24/2016   Low libido 09/26/2016   Environmental and seasonal allergies 09/26/2016   History of substance abuse (HCC) 09/26/2016   Prediabetes 09/05/2015   Vitamin D deficiency 09/05/2015   Mixed hyperlipidemia 09/05/2015   Gout 07/26/2015   Former smoker 07/26/2015   Recurrent sinusitis 07/26/2015   Cervical disc disease 07/26/2015   Morbid (severe) obesity due to excess calories (HCC) 07/26/2015    Past Surgical History:  Procedure Laterality Date   A-FLUTTER ABLATION N/A 04/06/2021   Procedure: A-FLUTTER ABLATION;  Surgeon: Lanier Prude, MD;  Location: [facility] INVASIVE CV LAB;  Service: Cardiovascular;  Laterality: N/A;   CARDIAC ELECTROPHYSIOLOGY STUDY AND ABLATION     COLONOSCOPY  2018   CORONARY ANGIOGRAM  2009   Social History   Tobacco Use   Smoking status: Former    Current packs/day: 0.00    Average packs/day: 0.5 packs/day for 37.0 years (18.5 ttl pk-yrs)    Types: Cigarettes    Start date: 01/11/1984    Quit date: 01/10/2021    Years since quitting: 2.4   Smokeless tobacco: Never   Tobacco comments:    not smoking currently-[place] 02/20/21  Vaping Use   Vaping status: Former  Substance Use Topics   Alcohol use: Yes    Alcohol/week: 12.0 standard drinks of alcohol    Types: 12 Cans of beer per week   Drug use: No   Family History  Problem Relation Age of Onset   Asthma Mother    COPD Mother    Cancer Mother        Skin   Cancer - Other Mother        bone, liver, breast, lung   Emphysema Mother    Heart disease Father        Pacemaker   Parkinson's disease Father    Depression Father    Depression Daughter    OCD Daughter    Alcohol abuse Maternal Grandmother    Schizophrenia Maternal Grandmother    Diabetes Paternal Grandfather    Kidney disease Paternal Grandfather    Colon polyps Neg Hx    Colon cancer Neg Hx    Esophageal cancer Neg Hx    Stomach cancer Neg Hx    Rectal cancer Neg Hx    No Known Allergies   Patient  Care Team: Melida Quitter, PA as PCP - General (Family Medicine) Rollene Rotunda, MD as PCP - Cardiology (Cardiology) Lanier Prude, MD as PCP - Electrophysiology (Cardiology) Christia Reading, MD as Consulting Physician (Otolaryngology) Marcelyn Bruins, MD as Consulting Physician (Allergy) Iva Boop, MD as Consulting Physician (Gastroenterology) Cottle, Steva Ready., MD as Attending Physician (Psychiatry)   Outpatient Medications Prior to Visit  Medication Sig   albuterol (PROAIR HFA) 108 (90 Base) MCG/ACT inhaler Inhale 2 puffs into the lungs every 4 (four) hours as needed for wheezing or shortness of breath.   allopurinol (ZYLOPRIM) 300 MG tablet Take 1 tablet by mouth once daily   busPIRone (BUSPAR) 15 MG tablet Take 1 tablet (15 mg total) by mouth every morning.   diltiazem (CARDIZEM CD) 120 MG 24 hr capsule Take 1 capsule by mouth once daily   DULERA 200-5 MCG/ACT AERO INHALE 2 PUFFS TWICE DAILY   fexofenadine (ALLEGRA) 180 MG tablet Take 180 mg by mouth daily as needed for allergies.   Melatonin 10 MG TABS Take 1 tablet by mouth at bedtime as needed.   Tiotropium Bromide Monohydrate (SPIRIVA RESPIMAT) 2.5 MCG/ACT AERS INHALE 2 SPRAY(S) BY MOUTH ONCE DAILY   vitamin B-12 (CYANOCOBALAMIN) 100 MCG tablet Take 100 mcg by mouth daily.   [DISCONTINUED] benzonatate (TESSALON) 200 MG capsule Take 1 capsule by mouth three times daily as needed   [DISCONTINUED] dicyclomine (BENTYL) 20 MG tablet TAKE 1 TABLET BY MOUTH 4 TIMES DAILY BEFORE MEAL(S) AND AT BEDTIME AS NEEDED   [DISCONTINUED] doxycycline (VIBRAMYCIN) 100 MG capsule Take 1 capsule (100 mg total) by mouth 2 (two) times daily.   [DISCONTINUED] fluticasone (FLONASE) 50 MCG/ACT nasal spray Place 2 sprays into the nose daily.   [DISCONTINUED] hydrochlorothiazide (MICROZIDE) 12.5 MG capsule Take 1 capsule by mouth once daily   [DISCONTINUED] ipratropium-albuterol (DUONEB) 0.5-2.5 (3) MG/3ML SOLN TAKE IN NEBULIZER  EVERY 6 HOURS AS NEEDED   [DISCONTINUED] levothyroxine (SYNTHROID) 50 MCG tablet Take 1 tablet by mouth once daily   [DISCONTINUED] losartan (COZAAR) 50 MG tablet Take 1 tablet by mouth once daily   [DISCONTINUED] propranolol (INDERAL) 20 MG tablet TAKE 1 TABLET BY MOUTH THREE TIMES DAILY   [DISCONTINUED] sildenafil (VIAGRA) 50 MG tablet Take 1 tablet (50 mg total) by mouth daily as needed for erectile dysfunction.   [  DISCONTINUED] Sodium Chloride-Sodium Bicarb (AYR SALINE NASAL RINSE NA) Place 1 Dose into the nose daily as needed (congestion).   [DISCONTINUED] aspirin EC 81 MG tablet Take 1 tablet (81 mg total) by mouth daily. Swallow whole. (Patient not taking: Reported on 02/25/2023)   [DISCONTINUED] Cholecalciferol (VITAMIN D3) 5000 units TABS 5,000 IU OTC vitamin D3 daily. (Patient taking differently: Take 5,000 Units by mouth daily.)   [DISCONTINUED] predniSONE (STERAPRED UNI-PAK 21 TAB) 10 MG (21) TBPK tablet Take by mouth daily. Take 6 tabs by mouth daily  for 2 days, then 5 tabs for 2 days, then 4 tabs for 2 days, then 3 tabs for 2 days, 2 tabs for 2 days, then 1 tab by mouth daily for 2 days (Patient not taking: Reported on 02/25/2023)   No facility-administered medications prior to visit.    Review of Systems  Constitutional:  Negative for chills, fever and malaise/fatigue.  HENT:  Negative for congestion and hearing loss.   Eyes:  Negative for blurred vision and double vision.  Respiratory:  Negative for cough and shortness of breath.   Cardiovascular:  Negative for chest pain, palpitations and leg swelling.  Gastrointestinal:  Negative for abdominal pain, constipation, diarrhea and heartburn.  Genitourinary:  Negative for frequency and urgency.  Musculoskeletal:  Negative for myalgias and neck pain.  Neurological:  Negative for headaches.  Endo/Heme/Allergies:  Negative for polydipsia.  Psychiatric/Behavioral:  Negative for depression. The patient is not nervous/anxious and does  not have insomnia.       Objective:    BP 117/77 (BP Location: Left Arm, Patient Position: Sitting, Cuff Size: Large)   Pulse (!) 59   Resp 18   Ht 5\' 7"  (1.702 m)   Wt 265 lb (120.2 kg)   SpO2 92%   BMI 41.50 kg/m    Physical Exam Constitutional:      General: He is not in acute distress.    Appearance: Normal appearance.  HENT:     Head: Normocephalic and atraumatic.     Right Ear: Tympanic membrane, ear canal and external ear normal. There is no impacted cerumen.     Left Ear: Tympanic membrane, ear canal and external ear normal. There is no impacted cerumen.     Ears:     Comments: Nonimpacted cerumen bilaterally    Nose: Nose normal.     Mouth/Throat:     Mouth: Mucous membranes are moist.     Pharynx: Oropharynx is clear. No posterior oropharyngeal erythema.  Eyes:     Extraocular Movements: Extraocular movements intact.     Conjunctiva/sclera: Conjunctivae normal.     Pupils: Pupils are equal, round, and reactive to light.  Neck:     Thyroid: No thyroid mass, thyromegaly or thyroid tenderness.  Cardiovascular:     Rate and Rhythm: Normal rate and regular rhythm.     Heart sounds: Normal heart sounds. No murmur heard.    No friction rub. No gallop.  Pulmonary:     Effort: Pulmonary effort is normal. No respiratory distress.     Breath sounds: Normal breath sounds. No stridor. No wheezing or rales.  Abdominal:     General: Bowel sounds are normal.     Palpations: Abdomen is soft. There is no mass.     Tenderness: There is no abdominal tenderness.  Musculoskeletal:        General: Normal range of motion.     Cervical back: Normal range of motion and neck supple.  Lymphadenopathy:  Cervical: No cervical adenopathy.  Skin:    General: Skin is warm and dry.  Neurological:     Mental Status: He is alert and oriented to person, place, and time.     Cranial Nerves: No cranial nerve deficit.     Motor: No weakness.     Deep Tendon Reflexes: Reflexes normal.   Psychiatric:        Mood and Affect: Mood normal.       Assessment & Plan:    Routine Health Maintenance and Physical Exam  Immunization History  Administered Date(s) Administered   Influenza,inj,Quad PF,6+ Mos 07/26/2015   Tdap 02/27/2022   Zoster Recombinant(Shingrix) 02/27/2022, 05/16/2022    Health Maintenance  Topic Date Due   COVID-19 Vaccine (1 - 2023-24 season) 07/10/2023 (Originally 06/01/2023)   INFLUENZA VACCINE  12/29/2023 (Originally 05/01/2023)   Lung Cancer Screening  09/05/2023   Colonoscopy  04/08/2024   DTaP/Tdap/Td (2 - Td or Tdap) 02/28/2032   Hepatitis C Screening  Completed   HIV Screening  Completed   Zoster Vaccines- Shingrix  Completed   HPV VACCINES  Aged Out    Reviewed most recent labs including CBC, CMP, lipid panel, A1C, TSH, and vitamin D. All within normal limits/stable from last check other than A1c fairly stable at 5.8, triglycerides have increased to 197, LDL has decreased to 96.  Discussed health benefits of physical activity, and encouraged him to engage in regular exercise appropriate for his age and condition.  Wellness examination  Prediabetes Assessment & Plan: A1c fairly stable at 5.8.  Continue limiting carbs and sugar and getting routine physical activity.  Will continue to monitor  Orders: -     Hemoglobin A1c; Future  Mixed hyperlipidemia Assessment & Plan: Last lipid panel: LDL 96, HDL 41, triglycerides 197.  Triglycerides above goal. The 10-year ASCVD risk score (Arnett DK, et al., 2019) is: 7.9%, he does have a history of CAD.  He is agreeable to a trial of a low-dose of rosuvastatin 5 mg daily.  Orders: -     Rosuvastatin Calcium; Take 1 tablet (5 mg total) by mouth daily.  Dispense: 90 tablet; Refill: 3 -     Lipid panel; Future -     Comprehensive metabolic panel; Future -     Lipid panel; Future  Acquired hypothyroidism Assessment & Plan: TSH within normal limits.  Continue levothyroxine 50 mcg  daily.  Orders: -     Levothyroxine Sodium; Take 1 tablet (50 mcg total) by mouth daily.  Dispense: 90 tablet; Refill: 3 -     TSH Rfx on Abnormal to Free T4; Future  Essential hypertension Assessment & Plan: BP goal <130/80.  Stable, at goal.  Continue propranolol 20 mg 3 times daily, losartan 50 mg daily, hydrochlorothiazide 12.5 mg daily.  CMP within normal limits.  Will continue to monitor.  Orders: -     Propranolol HCl; Take 1 tablet (20 mg total) by mouth 3 (three) times daily.  Dispense: 270 tablet; Refill: 3 -     hydroCHLOROthiazide; Take 1 capsule by mouth once daily  Dispense: 90 capsule; Refill: 3 -     Losartan Potassium; Take 1 tablet by mouth once daily  Dispense: 90 tablet; Refill: 3 -     CBC with Differential/Platelet; Future -     Comprehensive metabolic panel; Future  COPD with acute exacerbation (HCC) -     Ipratropium-Albuterol; Take 3 mLs by nebulization every 6 (six) hours as needed.  Dispense: 120 mL; Refill: 2  Coronary artery disease involving native coronary artery of native heart without angina pectoris -     Rosuvastatin Calcium; Take 1 tablet (5 mg total) by mouth daily.  Dispense: 90 tablet; Refill: 3  Erectile dysfunction, unspecified erectile dysfunction type -     Sildenafil Citrate; Take 1 tablet (50 mg total) by mouth daily as needed for erectile dysfunction.  Dispense: 10 tablet; Refill: 3    Return in about 2 months (around 08/24/2023) for fasting labs for new medicine; 6 month follow-up for HTN, HLD, fasting blood work 1 week before.     Melida Quitter, PA

## 2023-06-24 NOTE — Assessment & Plan Note (Signed)
A1c fairly stable at 5.8.  Continue limiting carbs and sugar and getting routine physical activity.  Will continue to monitor

## 2023-06-27 ENCOUNTER — Other Ambulatory Visit: Payer: Self-pay | Admitting: Internal Medicine

## 2023-08-03 ENCOUNTER — Other Ambulatory Visit: Payer: Self-pay | Admitting: Internal Medicine

## 2023-08-13 ENCOUNTER — Other Ambulatory Visit: Payer: Self-pay | Admitting: Family Medicine

## 2023-08-13 ENCOUNTER — Encounter: Payer: Self-pay | Admitting: Psychiatry

## 2023-08-13 DIAGNOSIS — M1000 Idiopathic gout, unspecified site: Secondary | ICD-10-CM

## 2023-08-20 ENCOUNTER — Encounter: Payer: Self-pay | Admitting: Family Medicine

## 2023-08-20 ENCOUNTER — Telehealth: Payer: Self-pay | Admitting: Cardiology

## 2023-08-20 NOTE — Telephone Encounter (Signed)
Spoke with pt, aware according to the CT scan done 08/2022 the lung nodules were stable and do not need follow up. He is going to follow up with pulmonary for CA screening.

## 2023-08-20 NOTE — Telephone Encounter (Signed)
Patient returned RN's call regarding CT scan.

## 2023-08-20 NOTE — Telephone Encounter (Signed)
Left message to return call 

## 2023-08-20 NOTE — Telephone Encounter (Signed)
Patient called and said that he thought that Dr. Antoine Poche wanted him to get a CT scan before he saw him for appt. Please call to verify

## 2023-08-25 ENCOUNTER — Ambulatory Visit: Payer: BC Managed Care – PPO | Admitting: Cardiology

## 2023-08-25 ENCOUNTER — Other Ambulatory Visit: Payer: BC Managed Care – PPO

## 2023-09-08 ENCOUNTER — Other Ambulatory Visit: Payer: Self-pay | Admitting: Internal Medicine

## 2023-09-15 ENCOUNTER — Telehealth: Payer: Self-pay | Admitting: Adult Health

## 2023-09-15 NOTE — Telephone Encounter (Signed)
Patient needs refill of Dulera. Has three days left. Appointment has been scheduled for next available with Tammy,NP.  [facility]acy: [facility] on [address]

## 2023-09-16 MED ORDER — DULERA 200-5 MCG/ACT IN AERO: INHALATION_SPRAY | RESPIRATORY_TRACT | 5 refills | Status: DC

## 2023-09-16 NOTE — Telephone Encounter (Signed)
Rx refill sent.

## 2023-09-16 NOTE — Telephone Encounter (Signed)
Last ov 08/15/22 Pt requesting refill on Dulera Pt has appt scheduled for Feb 2025 Ok to refill?

## 2023-11-01 ENCOUNTER — Other Ambulatory Visit: Payer: Self-pay | Admitting: Internal Medicine

## 2023-11-02 ENCOUNTER — Other Ambulatory Visit: Payer: Self-pay | Admitting: Internal Medicine

## 2023-11-03 ENCOUNTER — Telehealth: Payer: Self-pay | Admitting: Adult Health

## 2023-11-03 ENCOUNTER — Other Ambulatory Visit: Payer: Self-pay | Admitting: Family Medicine

## 2023-11-03 MED ORDER — SPIRIVA RESPIMAT 2.5 MCG/ACT IN AERS: INHALATION_SPRAY | RESPIRATORY_TRACT | 0 refills | Status: DC

## 2023-11-03 NOTE — Telephone Encounter (Signed)
Prescription already sent by Rubye Oaks NP

## 2023-11-03 NOTE — Telephone Encounter (Signed)
Copied from CRM [id number]. Topic: Clinical - Medication Refill >> Nov 03, 2023 10:20 AM Thomes Dinning wrote: Most Recent Primary Care Visit:  Provider: Saralyn Pilar A  Department: [facility]  Visit Type: PHYSICAL  Date: 06/24/2023  Medication: Tiotropium Bromide Monohydrate (SPIRIVA RESPIMAT) 2.5 MCG/ACT AERS  Has the patient contacted their pharmacy? Yes (Agent: If no, request that the patient contact the pharmacy for the refill. If patient does not wish to contact the pharmacy document the reason why and proceed with request.) (Agent: If yes, when and what did the pharmacy advise?)  Is this the correct pharmacy for this prescription? Yes If no, delete pharmacy and type the correct one.  This is the patient's preferred pharmacy:  [facility] [address] [place], [place] - [address] [place] [address] Phone: [phone] Fax: [phone]  Has the prescription been filled recently? No  Is the patient out of the medication? Yes  Has the patient been seen for an appointment in the last year OR does the patient have an upcoming appointment? Yes  Can we respond through MyChart? Yes  Agent: Please be advised that Rx refills may take up to 3 business days. We ask that you follow-up with your pharmacy.

## 2023-11-03 NOTE — Telephone Encounter (Signed)
Patient needs refill of Spiriva.Please call patient if there is a problem or error. 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   Pharmacy: [facility] [address]

## 2023-11-03 NOTE — Telephone Encounter (Signed)
Spiriva refilled x 1 only  I called and spoke with the pt and notified of this and to be sure and keep ov 11/14/23  Nothing further needed

## 2023-11-04 ENCOUNTER — Ambulatory Visit: Payer: BC Managed Care – PPO | Admitting: Adult Health

## 2023-11-05 NOTE — Telephone Encounter (Signed)
 See telephone encounter from 2/3. Spiriva  was sent in on 2/3.  Nothing further needed.

## 2023-11-06 ENCOUNTER — Encounter: Payer: Self-pay | Admitting: Family Medicine

## 2023-11-09 ENCOUNTER — Other Ambulatory Visit: Payer: Self-pay | Admitting: Internal Medicine

## 2023-11-10 ENCOUNTER — Other Ambulatory Visit: Payer: Self-pay | Admitting: Internal Medicine

## 2023-11-14 ENCOUNTER — Telehealth: Payer: BC Managed Care – PPO | Admitting: Adult Health

## 2023-11-14 ENCOUNTER — Encounter: Payer: Self-pay | Admitting: Adult Health

## 2023-11-14 DIAGNOSIS — J441 Chronic obstructive pulmonary disease with (acute) exacerbation: Secondary | ICD-10-CM

## 2023-11-14 MED ORDER — DULERA 200-5 MCG/ACT IN AERO: INHALATION_SPRAY | RESPIRATORY_TRACT | 5 refills | Status: DC

## 2023-11-14 MED ORDER — SPIRIVA RESPIMAT 2.5 MCG/ACT IN AERS: INHALATION_SPRAY | RESPIRATORY_TRACT | 5 refills | Status: DC

## 2023-11-14 MED ORDER — PREDNISONE 20 MG PO TABS: 20.0000 mg | ORAL_TABLET | Freq: Every day | ORAL | 0 refills | Status: DC

## 2023-11-14 MED ORDER — ALBUTEROL SULFATE HFA 108 (90 BASE) MCG/ACT IN AERS: 1.0000 | INHALATION_SPRAY | Freq: Four times a day (QID) | RESPIRATORY_TRACT | 3 refills | Status: DC | PRN

## 2023-11-14 MED ORDER — IPRATROPIUM-ALBUTEROL 0.5-2.5 (3) MG/3ML IN SOLN: 3.0000 mL | Freq: Four times a day (QID) | RESPIRATORY_TRACT | 3 refills | Status: DC | PRN

## 2023-11-14 MED ORDER — AMOXICILLIN-POT CLAVULANATE 875-125 MG PO TABS: 1.0000 | ORAL_TABLET | Freq: Two times a day (BID) | ORAL | 0 refills | Status: DC

## 2023-11-14 NOTE — Patient Instructions (Addendum)
Augmentin 875 mg twice daily for 10 days, take with food Prednisone 20 mg daily for 5 days Continue on Dulera 2 puffs Twice daily , rinse after use.  Continue on Spiriva 2 puffs daily  Albuterol inhaler or Duoneb  As needed   Saline nasal rinses Twice daily  As needed   Mucinex DM Twice daily  As needed  cough/congestion .   Continue on CPAP At bedtime   Work on healthy weight loss.  Do not drive if sleepy  Avoid sedating medications as able.   Follow up with Dr. Marchelle Gearing 4 months  and As needed   Please contact office for sooner follow up if symptoms do not improve or worsen or seek emergency care

## 2023-11-14 NOTE — Progress Notes (Signed)
Virtual Visit via Video Note  I connected with Ralph Mack on 11/14/23 at  1:30 PM EST by a video enabled telemedicine application and verified that I am speaking with the correct person using two identifiers.  Location: Patient: Home  Provider: Office    I discussed the limitations of evaluation and management by telemedicine and the availability of in person appointments. The patient expressed understanding and agreed to proceed.  History of Present Illness: 58 year old male former smoker followed for mild COPD and obstructive sleep apnea Medical history significant for atrial flutter, lymphocytic colitis, diastolic heart failure Truck driver for [employer]  Today's video visit is a follow-up visit for COPD and obstructive sleep apnea.  He was last seen in the office in November 2023.  Patient has severe obstructive sleep apnea is on CPAP at bedtime.  Patient wears a CPAP each night.  Feels that he benefits from CPAP with decreased daytime sleepiness.  CPAP download shows excellent compliance with daily average usage at 7-8 or more hours.  AHI 2.2. Auto CPAP 5-20cmH2o. Uses nasal mask. DME [employer].   Patient has COPD.  He remains on [place] and Spiriva daily.   Complains of sinus infection symptoms with nasal congestion, drainage, stuffiness for last 3 weeks.  Patient now complaining of increased cough congestion with thick mucus.  Occasional intermittent wheezing.  Appetite is good with no nausea vomiting or diarrhea.  No hemoptysis, unintentional weight loss orthopnea, edema or calf pain.  Past Medical History:  Diagnosis Date   Acute on chronic respiratory failure (HCC) 01/06/2021   Walk Test 02/20/21- desat to 85% during second lap with HR 90. Improved to 95% on 2L. Patient denied shortness of breath during walk.   Allergy    Arthritis    COPD (chronic obstructive pulmonary disease) (HCC)    Hx of colonic polyp 11/13/2016   10/2016 7 mm ssp/a - recall 2023   Hypertension     Hypothyroidism    IBS (irritable bowel syndrome)    Lymphocytic colitis 04/19/2019   Mild CAD 2009   Obesity    OSA (obstructive sleep apnea) 04/20/2021   Paroxysmal atrial flutter (HCC)    Prediabetes    Recurrent upper respiratory infection (URI)       Observations/Objective: HST- 03/28/21- AHI 60/ hr, desaturation 49-82%, body weight 252 lbs PFTs March 08, 2021 FEV1 66%, ratio 70, FVC 72%, no significant bronchodilator response, DLCO 65%   CT chest 08/11/21 -Lungs clear , negative PE  11/14/2023 -appears in NAD, reports O2 sats 95% .    Assessment and Plan: Acute COPD exacerbation with sinusitis.  Will begin Augmentin x 10 days.  Prednisone 20 mg daily for 5 days. Encouraged on mucociliary clearance.  Continue on Dulera and Spiriva.  May use albuterol or DuoNeb as needed.  Advised needs to follow-up in the office in about 3 to 4 months also would recommend routine yearly chest x-ray.  Obstructive sleep apnea.  With excellent control and compliance on nocturnal CPAP.  Has perceived benefit.  Plan  Patient Instructions  Augmentin 875 mg twice daily for 10 days, take with food Prednisone 20 mg daily for 5 days Continue on Dulera 2 puffs Twice daily , rinse after use.  Continue on Spiriva 2 puffs daily  Albuterol inhaler or Duoneb  As needed   Saline nasal rinses Twice daily  As needed   Mucinex DM Twice daily  As needed  cough/congestion .   Continue on CPAP At bedtime   Work on healthy  weight loss.  Do not drive if sleepy  Avoid sedating medications as able.   Follow up with Dr. Marchelle Gearing 4 months  and As needed   Please contact office for sooner follow up if symptoms do not improve or worsen or seek emergency care             Follow Up Instructions:    I discussed the assessment and treatment plan with the patient. The patient was provided an opportunity to ask questions and all were answered. The patient agreed with the plan and demonstrated an understanding of  the instructions.   The patient was advised to call back or seek an in-person evaluation if the symptoms worsen or if the condition fails to improve as anticipated.  I provided 30  minutes of non-face-to-face time during this encounter.   Rubye Oaks, NP

## 2023-11-17 ENCOUNTER — Other Ambulatory Visit: Payer: Self-pay | Admitting: Family Medicine

## 2023-11-17 ENCOUNTER — Other Ambulatory Visit: Payer: Self-pay | Admitting: Internal Medicine

## 2023-11-17 DIAGNOSIS — M1000 Idiopathic gout, unspecified site: Secondary | ICD-10-CM

## 2023-12-01 ENCOUNTER — Other Ambulatory Visit: Payer: Self-pay | Admitting: Internal Medicine

## 2023-12-06 ENCOUNTER — Other Ambulatory Visit: Payer: Self-pay | Admitting: Internal Medicine

## 2023-12-08 ENCOUNTER — Telehealth: Payer: Self-pay | Admitting: Internal Medicine

## 2023-12-08 MED ORDER — DICYCLOMINE HCL 20 MG PO TABS: ORAL_TABLET | ORAL | 0 refills | Status: AC

## 2023-12-08 NOTE — Telephone Encounter (Signed)
 Inbound call from patient requesting a refill for dicyclomine medication. States he will be leaving town today and wishes to pick up medication before then. Please advise, thank you.

## 2023-12-08 NOTE — Telephone Encounter (Signed)
 I spoke with Ralph Mack and he was due to see [place] Dec 2024/Jan 2025. He needs a Monday. We don't have our June schedule out yet. He promised to call back early April to set this f/u appointment up. He takes the dicyclomine prn and wanted it for his trip. Refill sent in to [facility].

## 2023-12-12 ENCOUNTER — Telehealth: Payer: Self-pay | Admitting: Adult Health

## 2023-12-12 NOTE — Telephone Encounter (Signed)
 Gabby states [employer] inhaler is covered by insurance. Reference number is [id number]. Gabby phone number is [phone].

## 2023-12-12 NOTE — Telephone Encounter (Signed)
 Called and spoke with patient, advised him of message received by [place].  He stated he has not had any problems getting his inhalers filled.  Advised to call the office if he is told that the [facility] is not covered and we will get a message to the provider so we can change the inhaler.  He verbalized understanding.  Nothing further needed.

## 2023-12-12 NOTE — Telephone Encounter (Signed)
 Called [employer] and was informed that a form had been faxed over to Rubye Oaks NP regarding [place] inhaler for cost savings efforts.  This does not mean that the [facility] will not be covered.  She advised to have her review the change and check to either accept or decline and fax the form back.  Advised I would make Tammy aware.  I do not currently have the fax.  Will route to Tammy to make her aware.  Tammy, Patient's insurance is suggesting changing his inhaler to [place] from [place] for cost savings.  The patient is not requesting this change and is happy with his current medication regimen.  They will continue to cover the [facility].  Please advise.  Thank you.

## 2023-12-15 NOTE — Telephone Encounter (Signed)
 Can stay on [facility] but if becomes cost prohibitive will need to change .

## 2023-12-16 MED ORDER — DULERA 200-5 MCG/ACT IN AERO: INHALATION_SPRAY | RESPIRATORY_TRACT | 5 refills | Status: DC

## 2023-12-16 MED ORDER — SPIRIVA RESPIMAT 2.5 MCG/ACT IN AERS: INHALATION_SPRAY | RESPIRATORY_TRACT | 5 refills | Status: DC

## 2023-12-16 NOTE — Telephone Encounter (Signed)
 Spoke w/ Ralph Mack, pt is satisfied using the [facility] inhaler. He advised that if it become to costly he would let us know and then he would be open to trying a different inhaler. Refills for Dulera & Spiriva [facility]. Pt verbalized understanding. Nothing further needed.

## 2023-12-17 ENCOUNTER — Other Ambulatory Visit: Payer: BC Managed Care – PPO

## 2023-12-23 ENCOUNTER — Ambulatory Visit: Payer: BC Managed Care – PPO | Admitting: Family Medicine

## 2024-01-05 ENCOUNTER — Ambulatory Visit (HOSPITAL_COMMUNITY): Payer: BC Managed Care – PPO | Attending: General Practice

## 2024-01-05 DIAGNOSIS — I2510 Atherosclerotic heart disease of native coronary artery without angina pectoris: Secondary | ICD-10-CM | POA: Insufficient documentation

## 2024-01-05 LAB — ECHOCARDIOGRAM COMPLETE
Area-P 1/2: 3.26 cm2
S' Lateral: 3.8 cm

## 2024-01-08 ENCOUNTER — Telehealth: Payer: Self-pay | Admitting: *Deleted

## 2024-01-08 ENCOUNTER — Encounter: Payer: Self-pay | Admitting: *Deleted

## 2024-01-08 NOTE — Telephone Encounter (Signed)
 ATC patient x1.  Left detailed message per DPR.  Insurance is no longer covering [place] and have asked Tammy to change your inhaler to [place].  We have completed the paperwork and will fax it in.  Advised to call if he has any questions.  [place] message sent as well.

## 2024-01-09 ENCOUNTER — Other Ambulatory Visit: Payer: Self-pay | Admitting: *Deleted

## 2024-01-09 MED ORDER — BUDESONIDE-FORMOTEROL FUMARATE 160-4.5 MCG/ACT IN AERO: 2.0000 | INHALATION_SPRAY | Freq: Two times a day (BID) | RESPIRATORY_TRACT | 3 refills | Status: DC

## 2024-01-12 ENCOUNTER — Other Ambulatory Visit

## 2024-01-19 ENCOUNTER — Other Ambulatory Visit

## 2024-01-19 ENCOUNTER — Ambulatory Visit: Admitting: Family Medicine

## 2024-01-26 ENCOUNTER — Other Ambulatory Visit

## 2024-01-26 DIAGNOSIS — R7303 Prediabetes: Secondary | ICD-10-CM

## 2024-01-26 DIAGNOSIS — E039 Hypothyroidism, unspecified: Secondary | ICD-10-CM

## 2024-01-26 DIAGNOSIS — E782 Mixed hyperlipidemia: Secondary | ICD-10-CM

## 2024-01-26 DIAGNOSIS — I10 Essential (primary) hypertension: Secondary | ICD-10-CM

## 2024-02-02 ENCOUNTER — Ambulatory Visit: Admitting: Family Medicine

## 2024-02-15 ENCOUNTER — Other Ambulatory Visit: Payer: Self-pay | Admitting: Family Medicine

## 2024-02-15 DIAGNOSIS — M1000 Idiopathic gout, unspecified site: Secondary | ICD-10-CM

## 2024-02-16 ENCOUNTER — Other Ambulatory Visit

## 2024-02-17 ENCOUNTER — Ambulatory Visit: Payer: Self-pay | Admitting: Family Medicine

## 2024-02-17 LAB — CBC WITH DIFFERENTIAL/PLATELET
Basophils Absolute: 0.1 10*3/uL (ref 0.0–0.2)
Basos: 1 %
EOS (ABSOLUTE): 0.3 10*3/uL (ref 0.0–0.4)
Eos: 3 %
Hematocrit: 41.7 % (ref 37.5–51.0)
Hemoglobin: 14 g/dL (ref 13.0–17.7)
Immature Grans (Abs): 0 10*3/uL (ref 0.0–0.1)
Immature Granulocytes: 0 %
Lymphocytes Absolute: 1.9 10*3/uL (ref 0.7–3.1)
Lymphs: 24 %
MCH: 33 pg (ref 26.6–33.0)
MCHC: 33.6 g/dL (ref 31.5–35.7)
MCV: 98 fL — ABNORMAL HIGH (ref 79–97)
Monocytes Absolute: 0.8 10*3/uL (ref 0.1–0.9)
Monocytes: 11 %
Neutrophils Absolute: 4.7 10*3/uL (ref 1.4–7.0)
Neutrophils: 61 %
Platelets: 183 10*3/uL (ref 150–450)
RBC: 4.24 x10E6/uL (ref 4.14–5.80)
RDW: 12.4 % (ref 11.6–15.4)
WBC: 7.8 10*3/uL (ref 3.4–10.8)

## 2024-02-17 LAB — LIPID PANEL
Chol/HDL Ratio: 3.6 ratio (ref 0.0–5.0)
Cholesterol, Total: 175 mg/dL (ref 100–199)
HDL: 49 mg/dL (ref >39)
LDL Chol Calc (NIH): 109 mg/dL — ABNORMAL HIGH (ref 0–99)
Triglycerides: 95 mg/dL (ref 0–149)
VLDL Cholesterol Cal: 17 mg/dL (ref 5–40)

## 2024-02-17 LAB — COMPREHENSIVE METABOLIC PANEL WITH GFR
ALT: 23 IU/L (ref 0–44)
AST: 20 IU/L (ref 0–40)
Albumin: 4.4 g/dL (ref 3.8–4.9)
Alkaline Phosphatase: 65 IU/L (ref 44–121)
BUN/Creatinine Ratio: 18 (ref 9–20)
BUN: 14 mg/dL (ref 6–24)
Bilirubin Total: 0.6 mg/dL (ref 0.0–1.2)
CO2: 25 mmol/L (ref 20–29)
Calcium: 9.4 mg/dL (ref 8.7–10.2)
Chloride: 94 mmol/L — ABNORMAL LOW (ref 96–106)
Creatinine, Ser: 0.78 mg/dL (ref 0.76–1.27)
Globulin, Total: 2 g/dL (ref 1.5–4.5)
Glucose: 94 mg/dL (ref 70–99)
Potassium: 4.6 mmol/L (ref 3.5–5.2)
Sodium: 134 mmol/L (ref 134–144)
Total Protein: 6.4 g/dL (ref 6.0–8.5)
eGFR: 103 mL/min/{1.73_m2} (ref >59)

## 2024-02-17 LAB — TSH RFX ON ABNORMAL TO FREE T4: TSH: 1.58 u[IU]/mL (ref 0.450–4.500)

## 2024-02-17 LAB — HEMOGLOBIN A1C
Est. average glucose Bld gHb Est-mCnc: 117 mg/dL
Hgb A1c MFr Bld: 5.7 % — ABNORMAL HIGH (ref 4.8–5.6)

## 2024-02-25 ENCOUNTER — Encounter: Payer: Self-pay | Admitting: Family Medicine

## 2024-02-25 ENCOUNTER — Ambulatory Visit: Admitting: Family Medicine

## 2024-02-25 VITALS — BP 111/67 | HR 83 | Ht 67.0 in | Wt 265.0 lb

## 2024-02-25 DIAGNOSIS — Z1211 Encounter for screening for malignant neoplasm of colon: Secondary | ICD-10-CM | POA: Diagnosis not present

## 2024-02-25 DIAGNOSIS — R7303 Prediabetes: Secondary | ICD-10-CM | POA: Diagnosis not present

## 2024-02-25 DIAGNOSIS — E782 Mixed hyperlipidemia: Secondary | ICD-10-CM

## 2024-02-25 DIAGNOSIS — E039 Hypothyroidism, unspecified: Secondary | ICD-10-CM

## 2024-02-25 DIAGNOSIS — R5383 Other fatigue: Secondary | ICD-10-CM

## 2024-02-25 DIAGNOSIS — I10 Essential (primary) hypertension: Secondary | ICD-10-CM

## 2024-02-25 DIAGNOSIS — F411 Generalized anxiety disorder: Secondary | ICD-10-CM

## 2024-02-25 MED ORDER — OZEMPIC (0.25 OR 0.5 MG/DOSE) 2 MG/3ML ~~LOC~~ SOPN: 0.2500 mg | PEN_INJECTOR | SUBCUTANEOUS | 0 refills | Status: AC

## 2024-02-25 MED ORDER — PRAVASTATIN SODIUM 10 MG PO TABS: 10.0000 mg | ORAL_TABLET | Freq: Every day | ORAL | 2 refills | Status: DC

## 2024-02-25 NOTE — Assessment & Plan Note (Signed)
 Pt requested prescription be sent in for Ozempic. If insurance does not cover it, we discussed the possibility of oral weight loss medications including phentermine or wellbutrin. Pt instructed to send a MyChart message if he decides to pursue oral medication for weight loss. Pt also agreeable to referral to weight management clinic for more 1:1 weight-related care.

## 2024-02-25 NOTE — Progress Notes (Signed)
 Established Patient Office Visit  Subjective   Patient ID: Ralph Mack, male    DOB: 08-21-1966  Age: 58 y.o. MRN: [id number]  Chief Complaint  Patient presents with   Medical Management of Chronic Issues    HPI  Ralph Mack is a 58 y.o. M who presents to the clinic today for follow up on HTN, HLD. Overall doing well.   HTN - Pt currently taking propranolol  20 mg 3 times daily, losartan  50 mg daily, hydrochlorothiazide  12.5 mg daily. Denies HA/CP/dizziness/faintness. Not checking BP at home.   HLD - The 10-year ASCVD risk score (Arnett DK, et al., 2019) is: 7.7%, he does have a history of CAD.  At his last visit, he was started on a low dose of rosuvastatin  5 mg daily but reported that he was not taking it after reading about potenital side effects.   Pre-DM - A1c 6.5. Reports that he has quit drinking beer completely. Admits he is not monitoring his sugar intake like he should but wants to start.   Hypothyroidism - Pt reports that he is compliant with his levothyroxine  50 mcg daily. Denies skin changes or bowel habit changes. Does endorse more fatigue recently.   Panic attacks - Pt reports that he has not had a panic attack in over 5 years, so he is not taking an SSRI or his Buspar . Reports that mood is stable and he feels he does not need further medication management for this.   Fatigue - Pt reports that he has felt more fatigued over the last few months. Expresses concern that he may be deficient in something, which may be contributing to the fatigue. Reports he is sleeping well at night. Does have a history of sleep apnea which he wears a CPAP to bed for.   Weight loss - Pt expresses interest in pursuing medication-assisted weight loss. Pt reports that he has tried cutting out specific food groups and incorporating more physical activity into his daily life, but has not had success with losing weight. CW: 265 lbs.    Requesting referral for colonoscopy - due in  July      ROS Per HPI.    Objective:     BP 111/67   Pulse 83   Ht 5\' 7"  (1.702 m)   Wt 265 lb 0.6 oz (120.2 kg)   SpO2 91%   BMI 41.51 kg/m    Physical Exam Constitutional:      General: He is not in acute distress.    Appearance: Normal appearance.  Cardiovascular:     Rate and Rhythm: Normal rate and regular rhythm.     Heart sounds: Normal heart sounds. No murmur heard.    No friction rub. No gallop.  Pulmonary:     Effort: Pulmonary effort is normal. No respiratory distress.     Breath sounds: Normal breath sounds.  Musculoskeletal:        General: No swelling.  Skin:    General: Skin is warm and dry.  Neurological:     General: No focal deficit present.     Mental Status: He is alert.  Psychiatric:        Mood and Affect: Mood normal.        Behavior: Behavior normal.        Thought Content: Thought content normal.      No results found for any visits on 02/25/24.    The 10-year ASCVD risk score (Arnett DK, et al., 2019) is: 7.1%  Assessment & Plan:   Other fatigue Assessment & Plan: Pt reports generalized fatigue that has been worsening over the last few months. Has a history of low vit D, but has not been checked in a while. Ordered B12 and Vit D labs today to rule out causes of fatigue. Will follow up with patient regarding results in MyChart.  Orders: -     B12 and Folate Panel -     VITAMIN D  25 Hydroxy (Vit-D Deficiency, Fractures)  Encounter for screening colonoscopy -     Ambulatory referral to Gastroenterology  Prediabetes Assessment & Plan: A1c 6.5. Has quit drinking beer completely. Stressed the importance of monitoring daily sugar/carb intake. Will continue to monitor A1c and make medication adjustments as indicated  Orders: -     Ozempic (0.25 or 0.5 MG/DOSE); Inject 0.25 mg into the skin once a week for 28 days.  Dispense: 3 mL; Refill: 0  Acquired hypothyroidism Assessment & Plan: TSH within normal limits. Continue  levothyroxine  50 mcg daily.    Morbid (severe) obesity due to excess calories [facility]) Assessment & Plan: Pt requested prescription be sent in for Ozempic. If insurance does not cover it, we discussed the possibility of oral weight loss medications including phentermine or wellbutrin. Pt instructed to send a MyChart message if he decides to pursue oral medication for weight loss. Pt also agreeable to referral to weight management clinic for more 1:1 weight-related care.   Orders: -     Ozempic (0.25 or 0.5 MG/DOSE); Inject 0.25 mg into the skin once a week for 28 days.  Dispense: 3 mL; Refill: 0 -     Amb Ref to Medical Weight Management  Generalized anxiety disorder Assessment & Plan: Followed by psychiatry. Reports he has not had a panic attack in 5 years. Reports stable moods without any medications. -PHQ-9 score of 0, GAD-7 score of 0.   Mixed hyperlipidemia Assessment & Plan: Last lipid panel: LDL 109, HDL 49, Triglycerides 95. Pt was started on rosuvastatin  5 mg at last visit but reported that he never took it because of worry about side effects. After further discussion today, pt is agreeable to trying pravastatin 10 mg. Will plan to recheck at his physical in 6 months.     Fatigue, unspecified type Assessment & Plan: Pt reports generalized fatigue that has been worsening over the last few months. Has a history of low vit D, but has not been checked in a while. Ordered B12 and Vit D labs today to rule out causes of fatigue. Will follow up with patient regarding results in MyChart.   Essential hypertension Assessment & Plan: BP goal <130/80.  Stable, at goal.  Continue propranolol  20 mg 3 times daily, losartan  50 mg daily, hydrochlorothiazide  12.5 mg daily.  CMP within normal limits.  Will continue to monitor.    Other orders -     Pravastatin Sodium; Take 1 tablet (10 mg total) by mouth daily.  Dispense: 30 tablet; Refill: 2     Return in about 6 months (around 08/27/2024)  for Physical.    Odilia Bennett, PA-C

## 2024-02-25 NOTE — Assessment & Plan Note (Signed)
 Last lipid panel: LDL 109, HDL 49, Triglycerides 95. Pt was started on rosuvastatin  5 mg at last visit but reported that he never took it because of worry about side effects. After further discussion today, pt is agreeable to trying pravastatin 10 mg. Will plan to recheck at his physical in 6 months.

## 2024-02-25 NOTE — Assessment & Plan Note (Signed)
 Pt reports generalized fatigue that has been worsening over the last few months. Has a history of low vit D, but has not been checked in a while. Ordered B12 and Vit D labs today to rule out causes of fatigue. Will follow up with patient regarding results in MyChart.

## 2024-02-25 NOTE — Patient Instructions (Addendum)
 It was nice to see you today!  As we discussed in clinic  -Continue your regular medications for BP and monitor at home for symptoms dizziness and faintness -Continue to limit sugar/carb intake to keep A1c under control. -I will place referral for your colonoscopy  -I will also try sending in the weight loss injection, Ozempic, for weight loss and A1c management. If insurance does not cover this, we can discuss oral options for weight loss including: phentermine or wellbutrin.  -I will also place the referral to weight management in [facility] for more 1:1 assistance with weight loss -I am also going to order some more labs to investigate your fatigue: B12 and Vitamin D . I will send you a MyChart message with the results.    If you have any problems before your next visit feel free to message me via MyChart (minor issues or questions) or call the office, otherwise you may reach out to schedule an office visit.  Thank you! Meryl Acosta, PA-C

## 2024-02-25 NOTE — Assessment & Plan Note (Signed)
BP goal <130/80.  Stable, at goal.  Continue propranolol 20 mg 3 times daily, losartan 50 mg daily, hydrochlorothiazide 12.5 mg daily.  CMP within normal limits.  Will continue to monitor.

## 2024-02-25 NOTE — Assessment & Plan Note (Signed)
 Followed by psychiatry. Reports he has not had a panic attack in 5 years. Reports stable moods without any medications. -PHQ-9 score of 0, GAD-7 score of 0.

## 2024-02-25 NOTE — Assessment & Plan Note (Signed)
 A1c 6.5. Has quit drinking beer completely. Stressed the importance of monitoring daily sugar/carb intake. Will continue to monitor A1c and make medication adjustments as indicated

## 2024-02-25 NOTE — Assessment & Plan Note (Signed)
TSH within normal limits.  Continue levothyroxine 50 mcg daily.

## 2024-02-26 ENCOUNTER — Encounter: Payer: Self-pay | Admitting: Internal Medicine

## 2024-02-26 LAB — VITAMIN D 25 HYDROXY (VIT D DEFICIENCY, FRACTURES): Vit D, 25-Hydroxy: 55 ng/mL (ref 30.0–100.0)

## 2024-02-26 LAB — B12 AND FOLATE PANEL
Folate: 6.8 ng/mL (ref >3.0)
Vitamin B-12: 460 pg/mL (ref 232–1245)

## 2024-02-27 ENCOUNTER — Ambulatory Visit: Payer: Self-pay | Admitting: Family Medicine

## 2024-03-26 ENCOUNTER — Ambulatory Visit (AMBULATORY_SURGERY_CENTER): Admitting: *Deleted

## 2024-03-26 VITALS — Ht 67.5 in | Wt 262.0 lb

## 2024-03-26 DIAGNOSIS — Z860100 Personal history of colon polyps, unspecified: Secondary | ICD-10-CM

## 2024-03-26 MED ORDER — NA SULFATE-K SULFATE-MG SULF 17.5-3.13-1.6 GM/177ML PO SOLN: 1.0000 | Freq: Once | ORAL | 0 refills | Status: AC

## 2024-03-26 NOTE — Progress Notes (Signed)
 Pt's name and DOB verified at the beginning of the pre-visit with 2 identifiers  Pt denies any difficulty with ambulating,sitting, laying down or rolling side to side  Pt has no issues moving head neck or swallowing  No egg or soy allergy known to patient   No issues known to pt with past sedation with any surgeries or procedures  Patient denies ever being intubated  No FH of Malignant Hyperthermia  Pt is not on home 02   Pt is not on blood thinners   Pt has frequent issues with constipation RN instructed pt to use Miralax  per bottles instructions a week before prep days. Pt states they will  Pt is not on dialysis  Pt denise any abnormal heart rhythms   Pt denies any upcoming cardiac testing  Patient's chart reviewed by Norleen Schillings CNRA prior to pre-visit and patient appropriate for the [facility].  Pre-visit completed and red dot placed by patient's name on their procedure day (on provider's schedule).    Visit by phone  Pt states weight is 262 lb   IInstructions reviewed. Pt given  both [facility] main # and MD on call # prior to instructions.  Pt states understanding of instructions. Instructed pt to review instructions again prior to procedure and call main # given if has questions.. Pt states they will.   Instructed pt on where to find instructions on My Chart.

## 2024-03-29 ENCOUNTER — Encounter: Payer: Self-pay | Admitting: Internal Medicine

## 2024-03-31 ENCOUNTER — Telehealth: Payer: Self-pay | Admitting: Internal Medicine

## 2024-03-31 NOTE — Telephone Encounter (Signed)
 Pt had questions regarding medications.Reviewed instructions with pt. No other questions at this time.

## 2024-03-31 NOTE — Telephone Encounter (Signed)
 Inbound call from patient stating he has further questions regarding medications. Please advise, thank you

## 2024-04-04 NOTE — Progress Notes (Unsigned)
 [facility] History and Physical   Primary Care Physician:  Gayle Saddie FALCON, PA-C   Reason for Procedure:  History of colon polyp  Plan:    Colonoscopy     HPI: Ralph Mack is a 58 y.o. male status post removal of a sessile serrated polyp in 2018 (7mm) and also a history of lymphocytic colitis diagnosed a colonoscopy 2020.  For surveillance colonoscopy exam.   Past Medical History:  Diagnosis Date   Acute on chronic respiratory failure (HCC) 01/06/2021   Walk Test 02/20/21- desat to 85% during second lap with HR 90. Improved to 95% on 2L. Patient denied shortness of breath during walk.   Allergy    Anxiety    Arthritis    Asthma    Cancer (HCC)    CHF (congestive heart failure) (HCC)    COPD (chronic obstructive pulmonary disease) (HCC)    Diabetes mellitus without complication (HCC)    Hx of colonic polyp 11/13/2016   10/2016 7 mm ssp/a - recall 2023   Hyperlipidemia    Hypertension    Hypothyroidism    IBS (irritable bowel syndrome)    Lymphocytic colitis 04/19/2019   Mild CAD 2009   Obesity    OSA (obstructive sleep apnea) 04/20/2021   Paroxysmal atrial flutter (HCC)    Prediabetes    Recurrent upper respiratory infection (URI)    Sleep apnea     Past Surgical History:  Procedure Laterality Date   A-FLUTTER ABLATION N/A 04/06/2021   Procedure: A-FLUTTER ABLATION;  Surgeon: Cindie Ole DASEN, MD;  Location: [facility];  Service: Cardiovascular;  Laterality: N/A;   CARDIAC ELECTROPHYSIOLOGY STUDY AND ABLATION     COLONOSCOPY  2018   CORONARY ANGIOGRAM  2009      Current Outpatient Medications  Medication Sig Dispense Refill   allopurinol  (ZYLOPRIM ) 300 MG tablet Take 1 tablet by mouth once daily 90 tablet 0   budesonide -formoterol  (BREYNA ) 160-4.5 MCG/ACT inhaler Inhale 2 puffs into the lungs in the morning and at bedtime. 3 each 3   dicyclomine  (BENTYL ) 20 MG tablet TAKE 1 TABLET BY MOUTH 4 TIMES DAILY BEFORE MEAL(S) AND AT BEDTIME AS  NEEDED 360 tablet 0   diltiazem  (CARDIZEM  CD) 120 MG 24 hr capsule Take 1 capsule by mouth once daily 90 capsule 3   hydrochlorothiazide  (MICROZIDE ) 12.5 MG capsule Take 1 capsule by mouth once daily 90 capsule 3   levothyroxine  (SYNTHROID ) 50 MCG tablet Take 1 tablet (50 mcg total) by mouth daily. 90 tablet 3   losartan  (COZAAR ) 50 MG tablet Take 1 tablet by mouth once daily 90 tablet 3   OVER THE COUNTER MEDICATION 50 mg daily at 6 (six) AM.     Tiotropium Bromide Monohydrate  (SPIRIVA  RESPIMAT) 2.5 MCG/ACT AERS INHALE 2 SPRAY(S) BY MOUTH ONCE DAILY 1 each 5   albuterol  (PROAIR  HFA) 108 (90 Base) MCG/ACT inhaler Inhale 1-2 puffs into the lungs every 6 (six) hours as needed for wheezing or shortness of breath. 18 g 3   amoxicillin -clavulanate (AUGMENTIN ) 875-125 MG tablet Take 1 tablet by mouth 2 (two) times daily. (Patient not taking: Reported on 04/05/2024) 20 tablet 0   busPIRone  (BUSPAR ) 15 MG tablet Take 1 tablet (15 mg total) by mouth every morning. (Patient not taking: Reported on 03/26/2024) 90 tablet 3   fexofenadine (ALLEGRA) 180 MG tablet Take 180 mg by mouth daily as needed for allergies.     ipratropium-albuterol  (DUONEB) 0.5-2.5 (3) MG/3ML SOLN Take 3 mLs by nebulization every 6 (  six) hours as needed. 120 mL 3   Melatonin 10 MG TABS Take 1 tablet by mouth at bedtime as needed. (Patient not taking: Reported on 03/26/2024)     pravastatin  (PRAVACHOL ) 10 MG tablet Take 1 tablet (10 mg total) by mouth daily. (Patient not taking: Reported on 03/26/2024) 30 tablet 2   propranolol  (INDERAL ) 20 MG tablet Take 1 tablet (20 mg total) by mouth 3 (three) times daily. 270 tablet 3   sildenafil  (VIAGRA ) 50 MG tablet Take 1 tablet (50 mg total) by mouth daily as needed for erectile dysfunction. 10 tablet 3   vitamin B-12 (CYANOCOBALAMIN ) 100 MCG tablet Take 100 mcg by mouth daily.     Current Facility-Administered Medications  Medication Dose Route Frequency Provider Last Rate Last Admin   0.9 %   sodium chloride  infusion  500 mL Intravenous Once Avram Lupita BRAVO, MD        Allergies as of 04/05/2024   (No Known Allergies)    Family History  Problem Relation Age of Onset   Asthma Mother    COPD Mother    Cancer Mother        Skin   Cancer - Other Mother        bone, liver, breast, lung   Emphysema Mother    Heart disease Father        Pacemaker   Parkinson's disease Father    Depression Father    Depression Daughter    OCD Daughter    Alcohol abuse Maternal Grandmother    Schizophrenia Maternal Grandmother    Diabetes Paternal Grandfather    Kidney disease Paternal Grandfather    Colon polyps Neg Hx    Colon cancer Neg Hx    Esophageal cancer Neg Hx    Stomach cancer Neg Hx    Rectal cancer Neg Hx     Social History   Socioeconomic History   Marital status: Married    Spouse name: Not on file   Number of children: 2   Years of education: Not on file   Highest education level: Some college, no degree  Occupational History   Occupation: truck Solicitor  Tobacco Use   Smoking status: Former    Current packs/day: 0.00    Average packs/day: 0.5 packs/day for 37.0 years (18.5 ttl pk-yrs)    Types: Cigarettes    Start date: 01/11/1984    Quit date: 01/10/2021    Years since quitting: 3.2   Smokeless tobacco: Never   Tobacco comments:    not smoking currently-[place] 02/20/21  Vaping Use   Vaping status: Former  Substance and Sexual Activity   Alcohol use: Yes    Alcohol/week: 12.0 standard drinks of alcohol    Types: 12 Cans of beer per week   Drug use: No   Sexual activity: Not on file  Other Topics Concern   Not on file  Social History Narrative   Deliveries, [place], 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     Social Drivers of Corporate investment banker Strain: Low Risk  (02/24/2024)   Overall Financial Resource Strain (CARDIA)    Difficulty of Paying Living Expenses: Not hard at all  Food Insecurity: No Food Insecurity (02/24/2024)   Hunger Vital Sign     Worried About Running Out of Food in the Last Year: Never true    Ran Out of Food in the Last Year: Never true  Transportation Needs: No Transportation Needs (02/24/2024)   PRAPARE - Transportation    Lack of  Transportation (Medical): No    Lack of Transportation (Non-Medical): No  Physical Activity: Inactive (02/24/2024)   Exercise Vital Sign    Days of Exercise per Week: 0 days    Minutes of Exercise per Session: 60 min  Stress: No Stress Concern Present (02/24/2024)   [employer] of Occupational Health - Occupational Stress Questionnaire    Feeling of Stress : Not at all  Social Connections: Moderately Isolated (02/24/2024)   Social Connection and Isolation Panel    Frequency of Communication with Friends and Family: More than three times a week    Frequency of Social Gatherings with Friends and Family: Once a week    Attends Religious Services: Never    Database administrator or Organizations: No    Attends Engineer, structural: Not on file    Marital Status: Married  Catering manager Violence: Not on file    Review of Systems:  All other review of systems negative except as mentioned in the HPI.  Physical Exam: Vital signs BP 127/83   Pulse 61   Temp 98.9 F (37.2 C) (Temporal)   Resp 18   Ht 5' 7.5 (1.715 m)   Wt 262 lb (118.8 kg)   SpO2 92%   BMI 40.43 kg/m   General:   Alert,  Well-developed, well-nourished, pleasant and cooperative in NAD Lungs:  Clear throughout to auscultation.   Heart:  Regular rate and rhythm; no murmurs, clicks, rubs,  or gallops. Abdomen:  Soft, nontender and nondistended. Normal bowel sounds.   Neuro/Psych:  Alert and cooperative. Normal mood and affect. A and O x 3   @Russell Engelstad  CHARLENA Commander, MD, [facility] [phone] (pager) 04/05/2024 2:29 PM@

## 2024-04-05 ENCOUNTER — Ambulatory Visit: Admitting: Internal Medicine

## 2024-04-05 ENCOUNTER — Encounter: Payer: Self-pay | Admitting: Internal Medicine

## 2024-04-05 VITALS — BP 112/63 | HR 65 | Temp 98.9°F | Resp 19 | Ht 67.5 in | Wt 262.0 lb

## 2024-04-05 DIAGNOSIS — D123 Benign neoplasm of transverse colon: Secondary | ICD-10-CM

## 2024-04-05 DIAGNOSIS — K648 Other hemorrhoids: Secondary | ICD-10-CM

## 2024-04-05 DIAGNOSIS — K5520 Angiodysplasia of colon without hemorrhage: Secondary | ICD-10-CM

## 2024-04-05 DIAGNOSIS — K635 Polyp of colon: Secondary | ICD-10-CM

## 2024-04-05 DIAGNOSIS — D125 Benign neoplasm of sigmoid colon: Secondary | ICD-10-CM

## 2024-04-05 DIAGNOSIS — Z860100 Personal history of colon polyps, unspecified: Secondary | ICD-10-CM

## 2024-04-05 DIAGNOSIS — K5730 Diverticulosis of large intestine without perforation or abscess without bleeding: Secondary | ICD-10-CM

## 2024-04-05 DIAGNOSIS — Z860101 Personal history of adenomatous and serrated colon polyps: Secondary | ICD-10-CM | POA: Diagnosis not present

## 2024-04-05 DIAGNOSIS — Z1211 Encounter for screening for malignant neoplasm of colon: Secondary | ICD-10-CM

## 2024-04-05 MED ORDER — SODIUM CHLORIDE 0.9 % IV SOLN: 500.0000 mL | Freq: Once | INTRAVENOUS | Status: DC

## 2024-04-05 NOTE — Progress Notes (Signed)
 1440 BP 139/115, Labetalol given.   Pt experienced severe OSA with jaw thrust  performed. vss

## 2024-04-05 NOTE — Progress Notes (Signed)
 Report given to PACU, vss

## 2024-04-05 NOTE — Op Note (Signed)
 [facility] Patient Name: Ralph Mack Procedure Date: 04/05/2024 1:48 PM MRN: [id number] Endoscopist: Lupita FORBES Commander , MD, [id number] Age: 58 Referring MD:  Date of Birth: 1966/07/02 Gender: Male Account #: [id number] Procedure:                Colonoscopy Indications:              High risk colon cancer surveillance: Personal                            history of sessile serrated colon polyp (less than                            10 mm in size) with no dysplasia Medicines:                Monitored Anesthesia Care Procedure:                Pre-Anesthesia Assessment:                           - Prior to the procedure, a History and Physical                            was performed, and patient medications and                            allergies were reviewed. The patient's tolerance of                            previous anesthesia was also reviewed. The risks                            and benefits of the procedure and the sedation                            options and risks were discussed with the patient.                            All questions were answered, and informed consent                            was obtained. Prior Anticoagulants: The patient has                            taken no anticoagulant or antiplatelet agents. ASA                            Grade Assessment: III - A patient with severe                            systemic disease. After reviewing the risks and                            benefits, the patient was deemed in satisfactory  condition to undergo the procedure.                           After obtaining informed consent, the colonoscope                            was passed under direct vision. Throughout the                            procedure, the patient's blood pressure, pulse, and                            oxygen  saturations were monitored continuously. The                            CF [id number] #[id number] was  introduced through the anus                            and advanced to the the cecum, identified by                            appendiceal orifice and ileocecal valve. The                            colonoscopy was performed without difficulty. The                            patient tolerated the procedure well. The quality                            of the bowel preparation was good. The ileocecal                            valve, appendiceal orifice, and rectum were                            photographed. The bowel preparation used was SUPREP                            via split dose instruction. Scope In: 2:40:58 PM Scope Out: 2:58:47 PM Scope Withdrawal Time: 0 hours 14 minutes 26 seconds  Total Procedure Duration: 0 hours 17 minutes 49 seconds  Findings:                 The perianal and digital rectal examinations were                            normal.                           Three flat and sessile polyps were found in the                            sigmoid colon and transverse colon. The polyps were  diminutive in size. These polyps were removed with                            a cold snare. Resection and retrieval were                            complete. Verification of patient identification                            for the specimen was done. Estimated blood loss was                            minimal.                           A single small angiodysplastic lesion without                            bleeding was found in the cecum.                           Multiple diverticula were found in the sigmoid                            colon and descending colon.                           Internal hemorrhoids were found.                           The exam was otherwise without abnormality on                            direct and retroflexion views. Complications:            No immediate complications. Estimated Blood Loss:     Estimated blood loss was  minimal. Impression:               - Three diminutive polyps in the sigmoid colon and                            in the transverse colon, removed with a cold snare.                            Resected and retrieved.                           - A single non-bleeding colonic angiodysplastic                            lesion.                           - Diverticulosis in the sigmoid colon and in the  descending colon.                           - Internal hemorrhoids.                           - The examination was otherwise normal on direct                            and retroflexion views.                           - Personal history of colonic polyp - 8 mm sesile                            serrated polyp 2018. Recommendation:           - Patient has a contact number available for                            emergencies. The signs and symptoms of potential                            delayed complications were discussed with the                            patient. Return to normal activities tomorrow.                            Written discharge instructions were provided to the                            patient.                           - Resume previous diet.                           - Continue present medications.                           - Await pathology results.                           - Repeat colonoscopy is recommended for                            surveillance. The colonoscopy date will be                            determined after pathology results from today's                            exam become available for review.                           - Stop dicyclomine  as having constiptaion (was  prescribed for diarrhea from lymphocytic colitis in                            past) Lupita FORBES Commander, MD 04/05/2024 3:11:29 PM This report has been signed electronically.

## 2024-04-05 NOTE — Progress Notes (Signed)
 1444 BP 175/118, Labetalol given IV, MD update, vss

## 2024-04-05 NOTE — Patient Instructions (Addendum)
 Try to stop dicyclomine  since you are now having constipation issues.  I found and removed three small polyps that look benign.  You also have a condition called diverticulosis - common and not usually a problem. Please read the handout provided. Hemorrhoids were swollen some. There was a small area of surface blood vessels called angiodysplasia or AVM - sometimes bleed and cause anemia. We do not treat them unless they are known to cause problems and usually do not.  I appreciate the opportunity to care for you. Lupita CHARLENA Commander, MD, FACG  YOU HAD AN ENDOSCOPIC PROCEDURE TODAY AT THE [facility]:   Refer to the procedure report that was given to you for any specific questions about what was found during the examination.  If the procedure report does not answer your questions, please call your gastroenterologist to clarify.  If you requested that your care partner not be given the details of your procedure findings, then the procedure report has been included in a sealed envelope for you to review at your convenience later.  YOU SHOULD EXPECT: Some feelings of bloating in the abdomen. Passage of more gas than usual.  Walking can help get rid of the air that was put into your GI tract during the procedure and reduce the bloating. If you had a lower endoscopy (such as a colonoscopy or flexible sigmoidoscopy) you may notice spotting of blood in your stool or on the toilet paper. If you underwent a bowel prep for your procedure, you may not have a normal bowel movement for a few days.  Please Note:  You might notice some irritation and congestion in your nose or some drainage.  This is from the oxygen  used during your procedure.  There is no need for concern and it should clear up in a day or so.  SYMPTOMS TO REPORT IMMEDIATELY:  Following lower endoscopy (colonoscopy or flexible sigmoidoscopy):  Excessive amounts of blood in the stool  Significant tenderness or worsening of abdominal  pains  Swelling of the abdomen that is new, acute  Fever of 100F or higher Resume previous diet Continue present medications Await pathology results  For urgent or emergent issues, a gastroenterologist can be reached at any hour by calling [phone]. Do not use MyChart messaging for urgent concerns.    DIET:  We do recommend a small meal at first, but then you may proceed to your regular diet.  Drink plenty of fluids but you should avoid alcoholic beverages for 24 hours.  ACTIVITY:  You should plan to take it easy for the rest of today and you should NOT DRIVE or use heavy machinery until tomorrow (because of the sedation medicines used during the test).    FOLLOW UP: Our staff will call the number listed on your records the next business day following your procedure.  We will call around 7:15- 8:00 am to check on you and address any questions or concerns that you may have regarding the information given to you following your procedure. If we do not reach you, we will leave a message.     If any biopsies were taken you will be contacted by phone or by letter within the next 1-3 weeks.  Please call us  at [phone] if you have not heard about the biopsies in 3 weeks.    SIGNATURES/CONFIDENTIALITY: You and/or your care partner have signed paperwork which will be entered into your electronic medical record.  These signatures attest to the fact that that  the information above on your After Visit Summary has been reviewed and is understood.  Full responsibility of the confidentiality of this discharge information lies with you and/or your care-partner.

## 2024-04-05 NOTE — Progress Notes (Signed)
 Pt's states no medical or surgical changes since previsit or office visit.  Patient denies chest pain.

## 2024-04-05 NOTE — Progress Notes (Signed)
 Called to room to assist during endoscopic procedure.  Patient ID and intended procedure confirmed with present staff. Received instructions for my participation in the procedure from the performing physician.

## 2024-04-06 ENCOUNTER — Telehealth: Payer: Self-pay | Admitting: *Deleted

## 2024-04-06 NOTE — Telephone Encounter (Signed)
 Left message on f/u call

## 2024-04-08 LAB — SURGICAL PATHOLOGY

## 2024-04-11 ENCOUNTER — Ambulatory Visit: Payer: Self-pay | Admitting: Internal Medicine

## 2024-04-11 ENCOUNTER — Encounter: Payer: Self-pay | Admitting: Internal Medicine

## 2024-04-18 ENCOUNTER — Other Ambulatory Visit: Payer: Self-pay | Admitting: Family Medicine

## 2024-04-18 ENCOUNTER — Other Ambulatory Visit: Payer: Self-pay | Admitting: Cardiology

## 2024-04-18 DIAGNOSIS — I10 Essential (primary) hypertension: Secondary | ICD-10-CM

## 2024-04-20 ENCOUNTER — Other Ambulatory Visit: Payer: Self-pay | Admitting: Cardiology

## 2024-05-19 ENCOUNTER — Other Ambulatory Visit: Payer: Self-pay | Admitting: Family Medicine

## 2024-05-19 DIAGNOSIS — M1000 Idiopathic gout, unspecified site: Secondary | ICD-10-CM

## 2024-06-21 ENCOUNTER — Other Ambulatory Visit: Payer: Self-pay | Admitting: Family Medicine

## 2024-06-21 DIAGNOSIS — I10 Essential (primary) hypertension: Secondary | ICD-10-CM

## 2024-06-30 ENCOUNTER — Other Ambulatory Visit: Payer: Self-pay | Admitting: Family Medicine

## 2024-06-30 DIAGNOSIS — I10 Essential (primary) hypertension: Secondary | ICD-10-CM

## 2024-07-17 ENCOUNTER — Other Ambulatory Visit: Payer: Self-pay | Admitting: Family Medicine

## 2024-07-17 DIAGNOSIS — N529 Male erectile dysfunction, unspecified: Secondary | ICD-10-CM

## 2024-08-08 ENCOUNTER — Ambulatory Visit (INDEPENDENT_AMBULATORY_CARE_PROVIDER_SITE_OTHER)

## 2024-08-08 ENCOUNTER — Inpatient Hospital Stay: Admission: RE | Admit: 2024-08-08 | Discharge: 2024-08-08 | Payer: Self-pay | Attending: Student

## 2024-08-08 VITALS — BP 122/86 | HR 82 | Temp 98.3°F | Resp 22 | Ht 67.5 in | Wt 261.9 lb

## 2024-08-08 DIAGNOSIS — J441 Chronic obstructive pulmonary disease with (acute) exacerbation: Secondary | ICD-10-CM

## 2024-08-08 DIAGNOSIS — J0100 Acute maxillary sinusitis, unspecified: Secondary | ICD-10-CM | POA: Diagnosis not present

## 2024-08-08 DIAGNOSIS — J449 Chronic obstructive pulmonary disease, unspecified: Secondary | ICD-10-CM

## 2024-08-08 HISTORY — DX: Other psychoactive substance abuse, uncomplicated: F19.10

## 2024-08-08 MED ORDER — CEFDINIR 300 MG PO CAPS: 300.0000 mg | ORAL_CAPSULE | Freq: Two times a day (BID) | ORAL | 0 refills | Status: AC

## 2024-08-08 MED ORDER — IPRATROPIUM-ALBUTEROL 0.5-2.5 (3) MG/3ML IN SOLN: 3.0000 mL | Freq: Four times a day (QID) | RESPIRATORY_TRACT | 3 refills | Status: AC | PRN

## 2024-08-08 MED ORDER — AZELASTINE HCL 0.1 % NA SOLN: 2.0000 | Freq: Two times a day (BID) | NASAL | 2 refills | Status: AC

## 2024-08-08 NOTE — ED Provider Notes (Signed)
 [facility]    CSN: [id number] Arrival date & time: 08/08/24  9046      History   Chief Complaint Chief Complaint  Patient presents with   Cough    HPI Ralph Mack is a 58 y.o. male presenting on day 11 of viral syndrome. H/o asthma, allergies, acute on chronic respiratory failure (12/2020), CHF, COPD, diabetes, COPD, OSA.  The nproductive cough, chest pain and tightness when coughing, hot and cold sweats, sinus drainage and pressure.   Patient states these symptoms started about 10 days ago with sweating a lot, cough (non productive), Sinus congestion/pressure with thick slimy PND, 3 days ago driving and it cough wouldn't stop so had to pull over due to this with chest discomfort. No fever, and has not taken antipyretic. Describes crackling sensation in chest. Endorses DOE. Taking his Breyna  inhaler bid as directed. Currently using the albuterol  inhaler bid with some relief.  Using duoneb nebulizer at home - last used this about 3 hours ago with relief. Former smoker.  HPI  Past Medical History:  Diagnosis Date   Acute on chronic respiratory failure (HCC) 01/06/2021   Walk Test 02/20/21- desat to 85% during second lap with HR 90. Improved to 95% on 2L. Patient denied shortness of breath during walk.   Allergy    Anxiety    Arthritis    Asthma    Cancer (HCC)    CHF (congestive heart failure) (HCC)    COPD (chronic obstructive pulmonary disease) (HCC)    Diabetes mellitus without complication (HCC)    Hx of colonic polyp 11/13/2016   10/2016 7 mm ssp/a 7/25 2 dimin adenomas   Hyperlipidemia    Hypertension    Hypothyroidism    IBS (irritable bowel syndrome)    Lymphocytic colitis 04/19/2019   Mild CAD 2009   Obesity    OSA (obstructive sleep apnea) 04/20/2021   Paroxysmal atrial flutter (HCC)    Prediabetes    Recurrent upper respiratory infection (URI)    Sleep apnea    Substance abuse [facility])     Patient Active Problem List   Diagnosis Date Noted    Encounter for screening colonoscopy 02/25/2024   Chronic respiratory failure with hypoxia (HCC) 08/17/2021   OSA (obstructive sleep apnea) 04/20/2021   Multiple pulmonary nodules 03/08/2021   Typical atrial flutter (HCC) 02/08/2021   COPD (chronic obstructive pulmonary disease) (HCC) 12/18/2020   CAD (coronary artery disease) 02/08/2020   Orthostatic lightheadedness 05/25/2019   Lymphocytic colitis 04/19/2019   Generalized anxiety disorder 10/05/2018   Essential hypertension 09/02/2018   Low testosterone  in male 08/17/2018   Hx of colonic polyp 11/13/2016   Hypothyroidism 10/24/2016   Fatigue 10/24/2016   OAB (overactive bladder) 10/24/2016   Low libido 09/26/2016   Environmental and seasonal allergies 09/26/2016   History of substance abuse (HCC) 09/26/2016   Prediabetes 09/05/2015   Vitamin D  deficiency 09/05/2015   Mixed hyperlipidemia 09/05/2015   Gout 07/26/2015   Former smoker 07/26/2015   Recurrent sinusitis 07/26/2015   Cervical disc disease 07/26/2015   Morbid (severe) obesity due to excess calories (HCC) 07/26/2015    Past Surgical History:  Procedure Laterality Date   A-FLUTTER ABLATION N/A 04/06/2021   Procedure: A-FLUTTER ABLATION;  Surgeon: Cindie Ole DASEN, MD;  Location: [facility] INVASIVE CV LAB;  Service: Cardiovascular;  Laterality: N/A;   CARDIAC ELECTROPHYSIOLOGY STUDY AND ABLATION     COLONOSCOPY  2018   CORONARY ANGIOGRAM  2009       Home Medications  Prior to Admission medications   Medication Sig Start Date End Date Taking? Authorizing Provider  albuterol  (PROAIR  HFA) 108 (90 Base) MCG/ACT inhaler Inhale 1-2 puffs into the lungs every 6 (six) hours as needed for wheezing or shortness of breath. 11/14/23  Yes Parrett, Madelin RAMAN, NP  allopurinol  (ZYLOPRIM ) 300 MG tablet Take 1 tablet by mouth once daily 05/19/24  Yes Clapp, Kara F, PA-C  budesonide -formoterol  (BREYNA ) 160-4.5 MCG/ACT inhaler Inhale 2 puffs into the lungs in the morning and at bedtime.  01/09/24  Yes Parrett, Tammy S, NP  dicyclomine  (BENTYL ) 20 MG tablet TAKE 1 TABLET BY MOUTH 4 TIMES DAILY BEFORE MEAL(S) AND AT BEDTIME AS NEEDED 12/08/23  Yes Avram Lupita BRAVO, MD  diltiazem  (CARDIZEM  CD) 120 MG 24 hr capsule Take 1 capsule by mouth once daily 04/21/24  Yes Lavona Agent, MD  hydrochlorothiazide  (MICROZIDE ) 12.5 MG capsule Take 1 capsule by mouth once daily 06/30/24  Yes Clapp, Kara F, PA-C  levothyroxine  (SYNTHROID ) 50 MCG tablet Take 1 tablet (50 mcg total) by mouth daily. 06/24/23  Yes Wallace Search A, PA  Tiotropium Bromide Monohydrate  (SPIRIVA  RESPIMAT) 2.5 MCG/ACT AERS INHALE 2 SPRAY(S) BY MOUTH ONCE DAILY 12/16/23  Yes Parrett, Tammy S, NP  azelastine (ASTELIN) 0.1 % nasal spray Place 2 sprays into both nostrils 2 (two) times daily. Use in each nostril as directed 08/08/24  Yes Arlyss Leita BRAVO, PA-C  cefdinir  (OMNICEF ) 300 MG capsule Take 1 capsule (300 mg total) by mouth 2 (two) times daily for 7 days. 08/08/24 08/15/24 Yes Gwendlyon Zumbro E, PA-C  fexofenadine (ALLEGRA) 180 MG tablet Take 180 mg by mouth daily as needed for allergies.    [provider]  ipratropium-albuterol  (DUONEB) 0.5-2.5 (3) MG/3ML SOLN Take 3 mLs by nebulization every 6 (six) hours as needed. 08/08/24   Elsey Holts E, PA-C  losartan  (COZAAR ) 50 MG tablet Take 1 tablet by mouth once daily 06/22/24   Gayle Saddie FALCON, PA-C  Melatonin 10 MG TABS Take 1 tablet by mouth at bedtime as needed. Patient not taking: Reported on 03/26/2024    [provider]  pravastatin  (PRAVACHOL ) 10 MG tablet Take 1 tablet (10 mg total) by mouth daily. Patient not taking: Reported on 03/26/2024 02/25/24   Chandra Toribio POUR, MD  propranolol  (INDERAL ) 20 MG tablet Take 1 tablet (20 mg total) by mouth 3 (three) times daily. 06/24/23   Wallace Search LABOR, PA  sildenafil  (VIAGRA ) 50 MG tablet TAKE 1 TABLET BY MOUTH ONCE DAILY AS NEEDED FOR ERECTILE DYSFUNCTION 07/19/24   Gayle Saddie F, PA-C  vitamin B-12 (CYANOCOBALAMIN ) 100  MCG tablet Take 100 mcg by mouth daily.    [provider]  apixaban  (ELIQUIS ) 5 MG TABS tablet Take 1 tablet (5 mg total) by mouth 2 (two) times daily. Patient not taking: No sig reported 01/10/21 04/06/21  Sonjia Held, MD    Family History Family History  Problem Relation Age of Onset   Asthma Mother    COPD Mother    Cancer Mother        Skin   Cancer - Other Mother        bone, liver, breast, lung   Emphysema Mother    Heart disease Father        Pacemaker   Parkinson's disease Father    Depression Father    Anxiety disorder Father    Depression Daughter    OCD Daughter    Alcohol abuse Maternal Grandmother    Schizophrenia Maternal Grandmother  Alcohol abuse Maternal Grandfather    Diabetes Paternal Grandfather    Kidney disease Paternal Grandfather    Colon polyps Neg Hx    Colon cancer Neg Hx    Esophageal cancer Neg Hx    Stomach cancer Neg Hx    Rectal cancer Neg Hx     Social History Social History   Tobacco Use   Smoking status: Former    Current packs/day: 0.00    Average packs/day: 0.5 packs/day for 37.0 years (18.5 ttl pk-yrs)    Types: Cigarettes    Start date: 01/11/1984    Quit date: 01/10/2021    Years since quitting: 3.5   Smokeless tobacco: Never   Tobacco comments:    not smoking currently-[place] 02/20/21  Vaping Use   Vaping status: Former  Substance Use Topics   Alcohol use: Yes    Alcohol/week: 12.0 standard drinks of alcohol    Types: 12 Cans of beer per week   Drug use: No     Allergies   Patient has no known allergies.   Review of Systems Review of Systems  Constitutional:  Positive for chills. Negative for appetite change and fever.  HENT:  Positive for congestion and sinus pressure. Negative for ear pain, rhinorrhea, sinus pain and sore throat.   Eyes:  Negative for redness and visual disturbance.  Respiratory:  Positive for cough and chest tightness. Negative for shortness of breath and wheezing.   Cardiovascular:   Negative for chest pain and palpitations.  Gastrointestinal:  Negative for abdominal pain, constipation, diarrhea, nausea and vomiting.  Genitourinary:  Negative for dysuria, frequency and urgency.  Musculoskeletal:  Negative for myalgias.  Neurological:  Negative for dizziness, weakness and headaches.  Psychiatric/Behavioral:  Negative for confusion.   All other systems reviewed and are negative.    Physical Exam Triage Vital Signs ED Triage Vitals  Encounter Vitals Group     BP --      Girls Systolic BP Percentile --      Girls Diastolic BP Percentile --      Boys Systolic BP Percentile --      Boys Diastolic BP Percentile --      Pulse --      Resp --      Temp --      Temp src --      SpO2 --      Weight 08/08/24 1007 261 lb 14.5 oz (118.8 kg)     Height 08/08/24 1007 5' 7.5 (1.715 m)     Head Circumference --      Peak Flow --      Pain Score 08/08/24 1004 0     Pain Loc --      Pain Education --      Exclude from Growth Chart --    No data found.  Updated Vital Signs BP 122/86 (BP Location: Left Arm)   Pulse 82   Temp 98.3 F (36.8 C) (Oral)   Resp (!) 22   Ht 5' 7.5 (1.715 m)   Wt 261 lb 14.5 oz (118.8 kg)   SpO2 93%   BMI 40.41 kg/m   Visual Acuity Right Eye Distance:   Left Eye Distance:   Bilateral Distance:    Right Eye Near:   Left Eye Near:    Bilateral Near:     Physical Exam Vitals reviewed.  Constitutional:      General: He is not in acute distress.    Appearance: Normal appearance. He is  not ill-appearing.  HENT:     Head: Normocephalic and atraumatic.     Right Ear: Tympanic membrane, ear canal and external ear normal. No tenderness. No middle ear effusion. There is no impacted cerumen. Tympanic membrane is not perforated, erythematous, retracted or bulging.     Left Ear: Tympanic membrane, ear canal and external ear normal. No tenderness.  No middle ear effusion. There is no impacted cerumen. Tympanic membrane is not perforated,  erythematous, retracted or bulging.     Nose: Nose normal. No congestion.     Mouth/Throat:     Mouth: Mucous membranes are moist.     Pharynx: Uvula midline. No oropharyngeal exudate or posterior oropharyngeal erythema.  Eyes:     Extraocular Movements: Extraocular movements intact.     Pupils: Pupils are equal, round, and reactive to light.  Cardiovascular:     Rate and Rhythm: Normal rate and regular rhythm.     Heart sounds: Normal heart sounds.  Pulmonary:     Effort: Pulmonary effort is normal.     Breath sounds: Normal breath sounds. No decreased breath sounds, wheezing, rhonchi or rales.     Comments: Frequent cough Lungs clear to auscultation Abdominal:     Palpations: Abdomen is soft.     Tenderness: There is no abdominal tenderness. There is no guarding or rebound.  Lymphadenopathy:     Cervical: No cervical adenopathy.     Right cervical: No superficial cervical adenopathy.    Left cervical: No superficial cervical adenopathy.  Neurological:     General: No focal deficit present.     Mental Status: He is alert and oriented to person, place, and time.  Psychiatric:        Mood and Affect: Mood normal.        Behavior: Behavior normal.        Thought Content: Thought content normal.        Judgment: Judgment normal.      UC Treatments / Results  Labs (all labs ordered are listed, but only abnormal results are displayed) Labs Reviewed - No data to display  EKG   Radiology DG Chest 2 View Result Date: 08/08/2024 EXAM: 2 VIEW(S) XRAY OF THE CHEST 08/08/2024 10:46:29 AM COMPARISON: 08/08/22. CLINICAL HISTORY: SOB x10 days - pt w COPD FINDINGS: LINES, TUBES AND DEVICES: Loop recorder in left chest. LUNGS AND PLEURA: Linear opacities at lung bases consistent with scarring. No pulmonary edema. No pleural effusion. No pneumothorax. HEART AND MEDIASTINUM: No acute abnormality of the cardiac and mediastinal silhouettes. BONES AND SOFT TISSUES: No acute osseous abnormality.  IMPRESSION: 1. No acute cardiopulmonary process. 2. Unchanged scarring at the lung bases. Electronically signed by: Waddell Calk MD 08/08/2024 11:09 AM EST RP Workstation: [id number]    Procedures Procedures (including critical care time)  Medications Ordered in UC Medications - No data to display  Initial Impression / Assessment and Plan / UC Course  I have reviewed the triage vital signs and the nursing notes.  Pertinent labs & imaging results that were available during my care of the patient were reviewed by me and considered in my medical decision making (see chart for details).     Patient is a pleasant 58 year old male presenting with sinusitis. The patient is afebrile and nontachycardic.  Antipyretic has not been administered today. On exam, lungs are clear to auscultation; last home nebulizer about 3 hours ago.  History COPD, respiratory failure. CXR 1. No acute cardiopulmonary process. 2. Unchanged scarring at the lung bases.  Did not check COVID or influenza test due to duration of symptoms.  Omnicef  sent for sinusitis. Continue inhalers as directed, including albuterol  inhaler twice daily as needed while symptoms persist, and DuoNeb nebulizer every 6 hours as needed. Nebulizer solution refilled. Also sent Azelastine. Continue flonase  and nasal rinse as directed. Return precautions as below.   Level 4 for acute exacerbation of chronic condition and prescription drug management.  Final Clinical Impressions(s) / UC Diagnoses   Final diagnoses:  Chronic obstructive pulmonary disease, unspecified COPD type (HCC)  Acute non-recurrent maxillary sinusitis     Discharge Instructions      -We are treating your sinus infection with Omnicef , twice daily x7 days.  I did not see a lung infection on your x-ray, but this antibiotic covers for lung infection as well. -Use the azelastine nasal spray for nasal congestion, 2 sprays twice daily while symptoms persist. -I refilled your  DuoNeb nebulizer. - Continue Flonase , nasal rinse using distilled water, albuterol  inhaler as needed -Your cough should slowly get better instead of worse. If you develop a cough productive of dark or red sputum, new shortness of breath, new chest tightness, new fevers, etc - seek additional care.  - Will call with any abnormal lab results by the end of the day.      ED Prescriptions     Medication Sig Dispense Auth. Provider   cefdinir  (OMNICEF ) 300 MG capsule Take 1 capsule (300 mg total) by mouth 2 (two) times daily for 7 days. 14 capsule Booker Bhatnagar E, PA-C   azelastine (ASTELIN) 0.1 % nasal spray Place 2 sprays into both nostrils 2 (two) times daily. Use in each nostril as directed 30 mL Arlyss Leita BRAVO, PA-C   ipratropium-albuterol  (DUONEB) 0.5-2.5 (3) MG/3ML SOLN Take 3 mLs by nebulization every 6 (six) hours as needed. 120 mL Anina Schnake E, PA-C      PDMP not reviewed this encounter.   Arlyss Leita BRAVO, PA-C 08/08/24 1117

## 2024-08-08 NOTE — ED Triage Notes (Signed)
 Unproductive cough, chest pain and tightness when coughing, hot and cold sweats, sinus drainage and pressure. - Entered by patient  Additional details: Patient states these symptoms started about 10 days ago with sweating a lot, cough (non productive), Sinus congestion/pressure, 3 days ago driving and it just wouldn't stop so had to pull over due to this with chest discomfort. No fever.

## 2024-08-08 NOTE — Discharge Instructions (Addendum)
-  We are treating your sinus infection with Omnicef , twice daily x7 days.  I did not see a lung infection on your x-ray, but this antibiotic covers for lung infection as well. -Use the azelastine nasal spray for nasal congestion, 2 sprays twice daily while symptoms persist. -I refilled your DuoNeb nebulizer. - Continue Flonase , nasal rinse using distilled water, albuterol  inhaler as needed -Your cough should slowly get better instead of worse. If you develop a cough productive of dark or red sputum, new shortness of breath, new chest tightness, new fevers, etc - seek additional care.  - Will call with any abnormal lab results by the end of the day.

## 2024-08-11 ENCOUNTER — Other Ambulatory Visit: Payer: Self-pay | Admitting: Adult Health

## 2024-08-15 ENCOUNTER — Telehealth: Admitting: Physician Assistant

## 2024-08-15 DIAGNOSIS — J208 Acute bronchitis due to other specified organisms: Secondary | ICD-10-CM | POA: Diagnosis not present

## 2024-08-15 MED ORDER — PREDNISONE 10 MG (21) PO TBPK: ORAL_TABLET | ORAL | 0 refills | Status: AC

## 2024-08-15 MED ORDER — BENZONATATE 100 MG PO CAPS: 100.0000 mg | ORAL_CAPSULE | Freq: Three times a day (TID) | ORAL | 0 refills | Status: AC | PRN

## 2024-08-15 NOTE — Progress Notes (Signed)
 We are sorry that you are not feeling well.  Here is how we plan to help!  Based on your presentation I believe you most likely have A cough due to a virus.  This is called viral bronchitis and is best treated by rest, plenty of fluids and control of the cough.  You may use Ibuprofen  or Tylenol  as directed to help your symptoms.     In addition you may use A prescription cough medication called Tessalon  Perles 100mg . You may take 1-2 capsules every 8 hours as needed for your cough.  Prednisone  10 mg daily for 6 days (see taper instructions below)  Directions for 6 day taper: Day 1: 2 tablets before breakfast, 1 after both lunch & dinner and 2 at bedtime Day 2: 1 tab before breakfast, 1 after both lunch & dinner and 2 at bedtime Day 3: 1 tab at each meal & 1 at bedtime Day 4: 1 tab at breakfast, 1 at lunch, 1 at bedtime Day 5: 1 tab at breakfast & 1 tab at bedtime Day 6: 1 tab at breakfast  From your responses in the eVisit questionnaire you describe inflammation in the upper respiratory tract which is causing a significant cough.  This is commonly called Bronchitis and has four common causes:   Allergies Viral Infections Acid Reflux Bacterial Infection Allergies, viruses and acid reflux are treated by controlling symptoms or eliminating the cause. An example might be a cough caused by taking certain blood pressure medications. You stop the cough by changing the medication. Another example might be a cough caused by acid reflux. Controlling the reflux helps control the cough.  USE OF BRONCHODILATOR (RESCUE) INHALERS: There is a risk from using your bronchodilator too frequently.  The risk is that over-reliance on a medication which only relaxes the muscles surrounding the breathing tubes can reduce the effectiveness of medications prescribed to reduce swelling and congestion of the tubes themselves.  Although you feel brief relief from the bronchodilator inhaler, your asthma may actually be  worsening with the tubes becoming more swollen and filled with mucus.  This can delay other crucial treatments, such as oral steroid medications. If you need to use a bronchodilator inhaler daily, several times per day, you should discuss this with your provider.  There are probably better treatments that could be used to keep your asthma under control.     HOME CARE Only take medications as instructed by your medical team. Complete the entire course of an antibiotic. Drink plenty of fluids and get plenty of rest. Avoid close contacts especially the very young and the elderly Cover your mouth if you cough or cough into your sleeve. Always remember to wash your hands A steam or ultrasonic humidifier can help congestion.   GET HELP RIGHT AWAY IF: You develop worsening fever. You become short of breath You cough up blood. Your symptoms persist after you have completed your treatment plan MAKE SURE YOU  Understand these instructions. Will watch your condition. Will get help right away if you are not doing well or get worse.  Your e-visit answers were reviewed by a board certified advanced clinical practitioner to complete your personal care plan.  Depending on the condition, your plan could have included both over the counter or prescription medications. If there is a problem please reply  once you have received a response from your provider. Your safety is important to us .  If you have drug allergies check your prescription carefully.    You  can use MyChart to ask questions about today's visit, request a non-urgent call back, or ask for a work or school excuse for 24 hours related to this e-Visit. If it has been greater than 24 hours you will need to follow up with your provider, or enter a new e-Visit to address those concerns. You will get an e-mail in the next two days asking about your experience.  I hope that your e-visit has been valuable and will speed your recovery. Thank you for using  e-visits.   I have spent 5 minutes in review of e-visit questionnaire, review and updating patient chart, medical decision making and response to patient.   Delon CHRISTELLA Dickinson, PA-C

## 2024-08-17 ENCOUNTER — Other Ambulatory Visit: Payer: Self-pay

## 2024-08-17 ENCOUNTER — Other Ambulatory Visit: Payer: Self-pay | Admitting: Family Medicine

## 2024-08-17 DIAGNOSIS — I10 Essential (primary) hypertension: Secondary | ICD-10-CM

## 2024-08-17 DIAGNOSIS — M1000 Idiopathic gout, unspecified site: Secondary | ICD-10-CM

## 2024-08-17 DIAGNOSIS — E039 Hypothyroidism, unspecified: Secondary | ICD-10-CM

## 2024-08-29 ENCOUNTER — Other Ambulatory Visit: Payer: Self-pay | Admitting: Cardiology

## 2024-08-30 ENCOUNTER — Encounter

## 2024-09-05 NOTE — Progress Notes (Deleted)
 OV 01/25/2021  Subjective:  Patient ID: Ralph Mack, male , DOB: 1966/05/01 , age 58 y.o. , MRN: [id number] , ADDRESS: [address] [place] [address] PCP Blenda Headings, PA-C Patient Care Team: Blenda Headings, PA-C as PCP - General Lavona Agent, MD as PCP - Cardiology (Cardiology) Carlie Clark, MD as Consulting Physician (Otolaryngology) Jeneal Danita Macintosh, MD as Consulting Physician (Allergy) Avram Lupita BRAVO, MD as Consulting Physician (Gastroenterology) Cottle, Lorene KANDICE Raddle., MD as Attending Physician (Psychiatry)  This Provider for this visit: Treatment Team:  Attending Provider: Geronimo Amel, MD    01/25/2021 -   Chief Complaint  Patient presents with   Consult    SOB and wheezing, getting better slowly     HPI Ralph Mack 58 y.o. -new consultation.  He is a naval architect.  Former smoker.  He quit after his hospitalization.  History is gained from review of the chart and talking to him.  He also suffers from obesity, hypothyroidism, dyslipidemia, hypertension.  He was hospitalized on January 06, 2021.  At that time apparently was having fatigue for the last few to several weeks prior to the admission.  He was also noticed that he fell asleep on the steering wheel of his truck 1-2 times.  He drives around 5 miles a day.  At the time of hospitalization he was found to be hypoxemic with a left lower lobe pneumonia.  Pulmonary embolism was ruled out.  Incidental findings include subcarinal lymphadenopathy 1.5 cm along with other nodes.  And also 8 mm nodule along with another nodule.  He was found to be in paroxysmal atrial fibrillation.  Discharged on Eliquis .  He was also discharged on oxygen  which he is now only using at night.  He says he does not need it at daytime.  In fact when we walked him 185 feet x 3 laps on room air he did not desaturate.  Overall he is feeling better.  He is very worried about his CT scan chest findings that I personally  visualized with him and documented below.  He is worried about cancer.  In addition he is worried about sleep apnea risk.  Because he is a naval architect he needs evaluation quickly.  He is asking for FMLA form to be filled out.  Since discharge he also strained his left knee and has a knee brace. Noted with smoking history is concerned about presence of COPD.  SYMPTOM SCALE  01/25/2021   O2 use ra  Shortness of Breath 0 -> 5 scale with 5 being worst (score 6 If unable to do)  At rest 0  Simple tasks - showers, clothes change, eating, shaving 1  Household (dishes, doing bed, laundry) 1  Shopping 1  Walking level at own pace 1  Walking up Stairs 1  Total (30-36) Dyspnea Score 5  How bad is your cough? 0  How bad is your fatigue 1  How bad is nausea 0  How bad is vomiting?  00  How bad is diarrhea? 0  How bad is anxiety? 0  How bad is depression 0        Current walking desaturation test: Sitting pulse ox 93% heart rate 88/min.  Final pulse ox 92% after 3 laps.  Final heart rate 100/min.  Average pace no shortness of breath.    CT Chest data 01/06/21   Narrative & Impression  CLINICAL DATA:  Shortness of breath   EXAM: CT ANGIOGRAPHY CHEST WITH CONTRAST   TECHNIQUE:  Multidetector CT imaging of the chest was performed using the standard protocol during bolus administration of intravenous contrast. Multiplanar CT image reconstructions and MIPs were obtained to evaluate the vascular anatomy.   CONTRAST:  80mL OMNIPAQUE  IOHEXOL  350 MG/ML SOLN   COMPARISON:  January 06, 2021   FINDINGS: Cardiovascular: There is no demonstrable pulmonary embolus. There is no evident thoracic aortic aneurysm or dissection. Visualized great vessels appear unremarkable. There are foci coronary artery calcification. There is no pericardial effusion or pericardial thickening.   Mediastinum/Nodes: Thyroid  appears unremarkable. There is a lymph node in the aortopulmonary window region measuring  1.4 x 1.3 cm. There is a lymph node to the right of the distal trachea measuring 1.3 x 1.1 cm. There is a subcarinal lymph node measuring 1.5 x 1.4 cm. A lymph node in the right hilum measures 1.0 x 1.0 cm. Several subcentimeter lymph nodes also noted in the mediastinum. No esophageal lesions are appreciable.   Lungs/Pleura: There are areas of patchy airspace opacity in the left lower lobe and inferior lingula. On axial slice 72 series 5, there is an 8 x 7 mm nodular opacity in the posterior segment of the left upper lobe near the major fissure. On axial slice 72 series 5, there is a 5 x 4 mm nodular opacity in the anterior segment of the right upper lobe. There is atelectatic change in the right lung base. Trachea and major bronchial structures appear normal. No pleural effusion. No pneumothorax.   Upper Abdomen: Visualized upper abdominal structures appear unremarkable.   Musculoskeletal: No blastic or lytic bone lesions evident. No chest wall lesions.   Review of the MIP images confirms the above findings.   IMPRESSION: 1. No demonstrable pulmonary embolus. No thoracic aortic aneurysm or dissection. There are foci of coronary artery calcification.   2. Areas of airspace opacity in the left lower lobe and inferior lingula consistent with pneumonia. Right base atelectasis.   3. 8 x 7 mm nodular opacity in the posterior segment left upper lobe near the major fissure. There is a 5 x 4 mm nodular opacity in the anterior segment of the right upper lobe. Non-contrast chest CT at 6-12 months is recommended. If the nodule is stable at time of repeat CT, then future CT at 18-24 months (from today's scan) is considered optional for low-risk patients, but is recommended for high-risk patients. This recommendation follows the consensus statement: Guidelines for Management of Incidental Pulmonary Nodules Detected on CT Images: From the Fleischner Society 2017; Radiology 2017;  284:228-243.   4. Several mildly enlarged lymph nodes of uncertain etiology. Lymph node prominence of this nature potentially could have reactive etiology given the areas of pneumonia. Neoplastic etiology cannot be excluded in this circumstance, however.     Electronically Signed   By: Elsie Repine III M.D.   On: 01/06/2021 11:59       ECHO 01/07/21   Sonographer:    Ellouise Mose RDCS  Referring Phys: [id number] [facility]      Sonographer Comments: Patient is morbidly obese and Technically difficult  study due to poor echo windows. Image acquisition challenging due to  patient body habitus.  IMPRESSIONS     1. Left ventricular ejection fraction, by estimation, is 60 to 65%. Left  ventricular ejection fraction by 3D volume is 67 %. The left ventricle has  normal function. The left ventricle has no regional wall motion  abnormalities. There is mild-to-moderate  concentric left ventricular hypertrophy. Left ventricular diastolic  parameters are  consistent with Grade II diastolic dysfunction  (pseudonormalization).   2. Right ventricular systolic function is normal. The right ventricular  size is normal.   3. The mitral valve is normal in structure. Trivial mitral valve  regurgitation. No evidence of mitral stenosis.   4. The aortic valve is tricuspid. Aortic valve regurgitation is not  visualized. No aortic stenosis is present.   5. Aortic dilatation noted. There is mild dilatation of the ascending  aorta, measuring 41 mm.   6. The inferior vena cava is dilated in size with <50% respiratory  variability, suggesting right atrial pressure of 15 mmHg.   Comparison(s): No prior Echocardiogram.   No flowsheet data found.  24/22- 54 yoM Smoker for sleep evaluation courtesy of Dr Geronimo Medical problem list includes AFlutter, Orthostasis, CAD, HTN, Chronic Bronchitis, Recurrent Sinusitis, Allergic Rhinitis,  Lymphocytic Colitis, Hypothyroid, Cervical Disc Disease,  Obesity, Hypertriglyceridemia,  Meds include Ventolin  HFA, Nicoderm CQ ,  Hosp April for Acute Hypoxic Resp Failure, COPD exacerbation, Pneumonia RUL nodule, , Was discharged on O2 for sleep. Scheduled for PFT and f/u with Dr Geronimo in June.  Epworth score- Body weight today- Covid vax-none Walk Test 02/20/21- desat to 85% during second lap with HR 90. Improved to 95% on 2L. Patient denied shortness of breath during walk. He notes daytime tiredness and has needed to pull over while driving. Hx snoring.  Nocturia x 4-5. Continues using O2 2l during sleep since [place] [facility] in April. Does have portable- not using. 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  Uses otc sleep med. 20 oz AM coffee.  Denies ENT surgery, but bothersome postnasal drip.Hx asthma, not feeling need now for inhalers.  Father died Parkinsons. Pt denies complex parasomni    DR  Neysa 04/20/21- Called and spoke with patient, he states that he already has a machine on it's way to him through the company he works for [employer]).  He missed the delivery for yesterday, but should receive it sometime today.  The machine is called [employer] and it is provided free of charge by his employer since his deductible has already been met for this year.  He gave me a contact # for Hunter Inch that he has been working with to get the machine.  She can be reached at [phone] ext:  [phone].  The fax # is [phone] and the e-mail address is [email or web address].  I let him know I would give her a call to see how we would go about getting the download information from the machine.   Called [place] and spoke with [facility], she advised that we can call and request a download and they can fax it to us .  I provided our fax # and that the fax should be to the attention of Dr. Neysa.  They have everything they need as far as setting, sleep study and history.  06/25/21- Dr Neysa 55 yoM Smoker (18.5 pk yrs) followed for  OSA, Complicated by Chronic Respiratory Failure with Hypoxia ( Dr Geronimo) Chronic Bronchitis, AFlutter, Orthostasis, CAD, HTN, Recurrent Sinusitis, Allergic Rhinitis,  Lymphocytic Colitis, Hypothyroid, Cervical Disc Disease, Obesity, Hypertriglyceridemia,  Meds include Ventolin  HFA, Nicoderm CQ ,  O2 -dc'd per patient request- not using HST- 03/28/21- AHI 60/ hr, desaturation 49-82%, body weight 252 lbs CPAP  auto 5-20    / [employer] Driver DME ([phone]) Download- Body weight today-       xxxxxxxxxxxxxxxxxxxxx 08/17/2021 Follow up : COPD and OSA  Post hospital follow up  58 year old male former smoker (quit 12/2020) followed for mild COPD and obstructive sleep apnea Medial history Atrial Flutter, Lymphocytic Colitis , Diastolic Dysfunction  Truck Driver for [employer]   TEST/EVENTS :  HST- 03/28/21- AHI 60/ hr, desaturation 49-82%, body weight 252 lbs PFTs March 08, 2021 FEV1 66%, ratio 70, FVC 72%, no significant bronchodilator response, DLCO 65%  CT chest 08/11/21 -Lungs clear , negative PE   Patient returns for follow up.  Patient has underlying moderate COPD.  Patient was recently hospitalized for a COPD exacerbation with acute respiratory failure with hypoxemia.  He was treated with empiric antibiotics and steroids.  Discharged on a prednisone  taper.  Patient was started on oxygen  at discharge at 2 L with activity.  He was started on Dulera  at discharge.  CT chest was negative for PE.  Showed clear lungs.  Patient says since discharge he is feeling some better but continues to get short of breath with activities.  He did obtain a pulse oximeter.  And notices that his oxygen  levels do drop.  Seems to be worse at nighttime.  Today in the office walk test with a forehead probe showed no significant desaturations with O2 saturations maintaining around 89 to 93% on room air.  Patient says he has not been wearing his oxygen  very much since discharge.  Patient has underlying obstructive  sleep apnea.  Is on CPAP at bedtime.  Patient says he is trying to wear his CPAP every single night.  Usually gets in about 6 hours.  Patient says at times it does not feel that his CPAP is strong enough.  He also notices that his oxygen  at times is low whenever he checks it at nighttime.  CPAP download shows excellent compliance with daily average usage at 6 hours.  Patient is on auto CPAP 5 to 20 cm H2O.  Daily average pressure at 6 cm of H2O.  AHI 3.7/hour.  Patient does go through safe sleep drivers through [employer].  Patient has quit smoking.  He smoked about a pack a day for the last 40 years.  We discussed low-dose CT screening program.  Patient is agreeable.Declines flu and covid vaccine .      OV 09/07/2021  Subjective:  Patient ID: Ralph Mack, male , DOB: Dec 25, 1965 , age 68 y.o. , MRN: [id number] , ADDRESS: [address] [place] [address] PCP Blenda Headings, PA-C Patient Care Team: Blenda Headings, PA-C as PCP - General Lavona Agent, MD as PCP - Cardiology (Cardiology) Cindie Ole DASEN, MD as PCP - Electrophysiology (Cardiology) Carlie Clark, MD as Consulting Physician (Otolaryngology) Jeneal Danita Macintosh, MD as Consulting Physician (Allergy) Avram Lupita BRAVO, MD as Consulting Physician (Gastroenterology) Cottle, Lorene KANDICE Raddle., MD as Attending Physician (Psychiatry)  This Provider for this visit: Treatment Team:  Attending Provider: Geronimo Amel, MD    09/07/2021 -   Chief Complaint  Patient presents with   Follow-up    Pt states since being placed on the maintenance inhalers, his breathing has been better.     HPI Ralph Mack 58 y.o. -returns for follow-up.  I only saw him once in April 2022.  After that he had pulmonary function test in June 2022 and saw a publishing rights manager.  He had gold stage II COPD FEV1 64%.  He was asymptomatic at that time.  Nurse practitioner dispensed albuterol  as needed.  Then in November 2022 ended up  with a COPD exacerbation in the ER.  Apparently the ER doctor informed him that he  should have been on maintenance inhaler.  He is upset that we did not do that.  Nevertheless currently is on Spiriva  and Dulera  and is doing well.  His alpha-1 is MM.  From the hospital in November 2022 he was given oxygen .  He does not want this.  Last month our nurse practitioner walked him and his saturations were above 88%.  He wants to return it.  I have agreed and respected his personal choice.  He is also due for flu shot  Cancer screening and smoking history of 40 pack: He smoking is in remission since April 2022.  He is excited about this.  He had a CT angiogram chest in the ER November 2022.  The lung fields are clear.  He still wanted low-dose CT scan of the chest done now before the end of the year 2022.  I informed him the risk of lung cancer and is extremely low less than 1%.  He felt reassured.  He has been referred to the low-dose CT scan program by the nurse practitioner recently   Obesity: After quitting smoking is gained weight  Sleep apnea: This is being coordinated through his employer and Dr. Neysa.  He is using his CPAP without oxygen . CT Chest data  No results found.     OV 09/05/2024  Subjective:  Patient ID: Ralph Mack, male , DOB: [birth year] , age 11 y.o. , MRN: [id number] , ADDRESS: [address] [place] [address] PCP Gayle Saddie FALCON, PA-C Patient Care Team: Gayle Saddie FALCON DEVONNA as PCP - General (Physician Assistant) Lavona Agent, MD as PCP - Cardiology (Cardiology) Cindie Ole DASEN, MD as PCP - Electrophysiology (Cardiology) Carlie Clark, MD as Consulting Physician (Otolaryngology) Jeneal Danita Macintosh, MD as Consulting Physician (Allergy) Avram Lupita BRAVO, MD as Consulting Physician (Gastroenterology) Cottle, Lorene KANDICE Raddle., MD as Attending Physician (Psychiatry)  This Provider for this visit: Treatment Team:  Attending Provider: Geronimo Amel,  MD    09/05/2024 -  No chief complaint on file.    HPI Ralph Mack 58 y.o. -    CT Chest data from date: ****  - personally visualized and independently interpreted : *** - my findings are: ***   PFT     Latest Ref Rng & Units 03/08/2021    9:04 AM  PFT Results  FVC-Pre L 3.04   FVC-Predicted Pre % 69   FVC-Post L 3.15   FVC-Predicted Post % 72   Pre FEV1/FVC % % 71   Post FEV1/FCV % % 70   FEV1-Pre L 2.16   FEV1-Predicted Pre % 64   FEV1-Post L 2.22   DLCO uncorrected ml/min/mmHg 16.68   DLCO UNC% % 65   DLCO corrected ml/min/mmHg 16.68   DLCO COR %Predicted % 65   DLVA Predicted % 97   TLC L 5.65   TLC % Predicted % 88   RV % Predicted % 88        LAB RESULTS last 96 hours No results found.       has a past medical history of Acute on chronic respiratory failure (HCC) (01/06/2021), Allergy, Anxiety, Arthritis, Asthma, Cancer (HCC), CHF (congestive heart failure) (HCC), COPD (chronic obstructive pulmonary disease) (HCC), Diabetes mellitus without complication (HCC), colonic polyp (11/13/2016), Hyperlipidemia, Hypertension, Hypothyroidism, IBS (irritable bowel syndrome), Lymphocytic colitis (04/19/2019), Mild CAD (2009), Obesity, OSA (obstructive sleep apnea) (04/20/2021), Paroxysmal atrial flutter (HCC), Prediabetes, Recurrent upper respiratory infection (URI), Sleep apnea, and Substance abuse (HCC).   reports that he quit smoking  about 3 years ago. His smoking use included cigarettes. He started smoking about 40 years ago. He has a 18.5 pack-year smoking history. He has never used smokeless tobacco.  Past Surgical History:  Procedure Laterality Date   A-FLUTTER ABLATION N/A 04/06/2021   Procedure: A-FLUTTER ABLATION;  Surgeon: Cindie Ole DASEN, MD;  Location: [facility] INVASIVE CV LAB;  Service: Cardiovascular;  Laterality: N/A;   CARDIAC ELECTROPHYSIOLOGY STUDY AND ABLATION     COLONOSCOPY  2018   CORONARY ANGIOGRAM  2009    No Known  Allergies  Immunization History  Administered Date(s) Administered   Influenza,inj,Quad PF,6+ Mos 07/26/2015   Tdap 02/27/2022   Zoster Recombinant(Shingrix) 02/27/2022, 05/16/2022    Family History  Problem Relation Age of Onset   Asthma Mother    COPD Mother    Cancer Mother        Skin   Cancer - Other Mother        bone, liver, breast, lung   Emphysema Mother    Heart disease Father        Pacemaker   Parkinson's disease Father    Depression Father    Anxiety disorder Father    Depression Daughter    OCD Daughter    Alcohol abuse Maternal Grandmother    Schizophrenia Maternal Grandmother    Alcohol abuse Maternal Grandfather    Diabetes Paternal Grandfather    Kidney disease Paternal Grandfather    Colon polyps Neg Hx    Colon cancer Neg Hx    Esophageal cancer Neg Hx    Stomach cancer Neg Hx    Rectal cancer Neg Hx      Current Outpatient Medications:    albuterol  (VENTOLIN  HFA) 108 (90 Base) MCG/ACT inhaler, INHALE 1 TO 2 PUFFS BY MOUTH EVERY 6 HOURS AS NEEDED FOR WHEEZING FOR SHORTNESS OF BREATH, Disp: 18 g, Rfl: 2   allopurinol  (ZYLOPRIM ) 300 MG tablet, Take 1 tablet by mouth once daily, Disp: 90 tablet, Rfl: 1   azelastine  (ASTELIN ) 0.1 % nasal spray, Place 2 sprays into both nostrils 2 (two) times daily. Use in each nostril as directed, Disp: 30 mL, Rfl: 2   benzonatate  (TESSALON ) 100 MG capsule, Take 1-2 capsules (100-200 mg total) by mouth 3 (three) times daily as needed., Disp: 30 capsule, Rfl: 0   budesonide -formoterol  (BREYNA ) 160-4.5 MCG/ACT inhaler, Inhale 2 puffs into the lungs in the morning and at bedtime., Disp: 3 each, Rfl: 3   dicyclomine  (BENTYL ) 20 MG tablet, TAKE 1 TABLET BY MOUTH 4 TIMES DAILY BEFORE MEAL(S) AND AT BEDTIME AS NEEDED, Disp: 360 tablet, Rfl: 0   diltiazem  (CARDIZEM  CD) 120 MG 24 hr capsule, Take 1 capsule (120 mg total) by mouth daily., Disp: 15 capsule, Rfl: 0   fexofenadine (ALLEGRA) 180 MG tablet, Take 180 mg by mouth daily as  needed for allergies., Disp: , Rfl:    hydrochlorothiazide  (MICROZIDE ) 12.5 MG capsule, Take 1 capsule by mouth once daily, Disp: 90 capsule, Rfl: 0   ipratropium-albuterol  (DUONEB) 0.5-2.5 (3) MG/3ML SOLN, Take 3 mLs by nebulization every 6 (six) hours as needed., Disp: 120 mL, Rfl: 3   levothyroxine  (SYNTHROID ) 50 MCG tablet, Take 1 tablet by mouth once daily, Disp: 90 tablet, Rfl: 0   losartan  (COZAAR ) 50 MG tablet, Take 1 tablet by mouth once daily, Disp: 90 tablet, Rfl: 0   Melatonin 10 MG TABS, Take 1 tablet by mouth at bedtime as needed. (Patient not taking: Reported on 03/26/2024), Disp: , Rfl:    pravastatin  (  PRAVACHOL ) 10 MG tablet, Take 1 tablet (10 mg total) by mouth daily. (Patient not taking: Reported on 03/26/2024), Disp: 30 tablet, Rfl: 2   predniSONE  (STERAPRED UNI-PAK 21 TAB) 10 MG (21) TBPK tablet, 6 day taper; take as directed on package instructions, Disp: 21 tablet, Rfl: 0   propranolol  (INDERAL ) 20 MG tablet, TAKE 1 TABLET BY MOUTH THREE TIMES DAILY, Disp: 270 tablet, Rfl: 0   sildenafil  (VIAGRA ) 50 MG tablet, TAKE 1 TABLET BY MOUTH ONCE DAILY AS NEEDED FOR ERECTILE DYSFUNCTION, Disp: 10 tablet, Rfl: 0   Tiotropium Bromide Monohydrate  (SPIRIVA  RESPIMAT) 2.5 MCG/ACT AERS, INHALE 2 SPRAY(S) BY MOUTH ONCE DAILY, Disp: 1 each, Rfl: 5   vitamin B-12 (CYANOCOBALAMIN ) 100 MCG tablet, Take 100 mcg by mouth daily., Disp: , Rfl:       Objective:   There were no vitals filed for this visit.  Estimated body mass index is 40.41 kg/m as calculated from the following:   Height as of 08/08/24: 5' 7.5 (1.715 m).   Weight as of 08/08/24: 261 lb 14.5 oz (118.8 kg).  @WEIGHTCHANGE @  There were no vitals filed for this visit.   Physical Exam   General: No distress. *** O2 at rest: *** Cane present: *** Sitting in wheel chair: *** Frail: *** Obese: *** Neuro: Alert and Oriented x 3. GCS 15. Speech normal Psych: Pleasant Resp:  Barrel Chest - ***.  Wheeze - ***, Crackles - ***, No  overt respiratory distress CVS: Normal heart sounds. Murmurs - *** Ext: Stigmata of Connective Tissue Disease - *** HEENT: Normal upper airway. PEERL +. No post nasal drip        Assessment/     Assessment & Plan COPD with acute exacerbation (HCC)    PLAN There are no Patient Instructions on file for this visit.    FOLLOWUP    No follow-ups on file.    SIGNATURE    Dr. Dorethia Cave, M.D., F.C.C.P,  Pulmonary and Critical Care Medicine Staff Physician, [facility] Director - Interstitial Lung Disease  Program  Pulmonary Fibrosis [facility] at [facility] [place], [place], [address]  Pager: [phone], If no answer or between  15:00h - 7:00h: call [phone] Telephone: [phone]  10:02 PM 09/05/2024   Moderate Complexity MDM OFFICE  2021 E/M guidelines, first released in 2021, with minor revisions added in 2023 and 2024 Must meet the requirements for 2 out of 3 dimensions to qualify.    Number and complexity of problems addressed Amount and/or complexity of data reviewed Risk of complications and/or morbidity  One or more chronic illness with mild exacerbation, OR progression, OR  side effects of treatment  Two or more stable chronic illnesses  One undiagnosed new problem with uncertain prognosis  One acute illness with systemic symptoms   One Acute complicated injury Must meet the requirements for 1 of 3 of the categories)  Category 1: Tests and documents, historian  Any combination of 3 of the following:  Assessment requiring an independent historian  Review of prior external note(s) from each unique source  Review of results of each unique test  Ordering of each unique test    Category 2: Interpretation of tests   Independent interpretation of a test performed by another physician/other qualified health care professional (not separately reported)  Category 3: Discuss  management/tests  Discussion of management or test interpretation with external physician/other qualified health care professional/appropriate source (not separately reported)  Moderate risk of morbidity from additional diagnostic testing or treatment Examples only:  Prescription drug management  Decision regarding minor surgery with identfied patient or procedure risk factors  Decision regarding elective major surgery without identified patient or procedure risk factors  Diagnosis or treatment significantly limited by social determinants of health             HIGh Complexity  OFFICE   2021 E/M guidelines, first released in 2021, with minor revisions added in 2023. Must meet the requirements for 2 out of 3 dimensions to qualify.    Number and complexity of problems addressed Amount and/or complexity of data reviewed Risk of complications and/or morbidity  Severe exacerbation of chronic illness  Acute or chronic illnesses that may pose a threat to life or bodily function, e.g., multiple trauma, acute MI, pulmonary embolus, severe respiratory distress, progressive rheumatoid arthritis, psychiatric illness with potential threat to self or others, peritonitis, acute renal failure, abrupt change in neurological status Must meet the requirements for 2 of 3 of the categories)  Category 1: Tests and documents, historian  Any combination of 3 of the following:  Assessment requiring an independent historian  Review of prior external note(s) from each unique source  Review of results of each unique test  Ordering of each unique test    Category 2: Interpretation of tests    Independent interpretation of a test performed by another physician/other qualified health care professional (not separately reported)  Category 3: Discuss management/tests  Discussion of management or test interpretation with external physician/other qualified health care professional/appropriate source  (not separately reported)  HIGH risk of morbidity from additional diagnostic testing or treatment Examples only:  Drug therapy requiring intensive monitoring for toxicity  Decision for elective major surgery with identified pateint or procedure risk factors  Decision regarding hospitalization or escalation of level of care  Decision for DNR or to de-escalate care   Parenteral controlled  substances            LEGEND - Independent interpretation involves the interpretation of a test for which there is a CPT code, and an interpretation or report is customary. When a review and interpretation of a test is performed and documented by the provider, but not separately reported (billed), then this would represent an independent interpretation. This report does not need to conform to the usual standards of a complete report of the test. This does not include interpretation of tests that do not have formal reports such as a complete blood count with differential and blood cultures. Examples would include reviewing a chest radiograph and documenting in the medical record an interpretation, but not separately reporting (billing) the interpretation of the chest radiograph.   An appropriate source includes professionals who are not health care professionals but may be involved in the management of the patient, such as a clinical research associate, upper officer, case manager or teacher, and does not include discussion with family or informal caregivers.    - SDOH: SDOH are the conditions in the environments where people are born, live, learn, work, play, worship, and age that affect a wide range of health, functioning, and quality-of-life outcomes and risks. (e.g., housing, food insecurity, transportation, etc.). SDOH-related Z codes ranging from Z55-Z65 are the ICD-10-CM diagnosis codes used to document SDOH data Z55 - Problems related to education and literacy Z56 - Problems related to employment  and unemployment Z57 - Occupational exposure to risk factors Z58 - Problems related to physical environment Z59 - Problems related to housing  and economic circumstances [phone] - Problems related to social environment [phone] - Problems related to upbringing [phone] - Other problems related to primary support group, including family circumstances Z59 - Problems related to certain psychosocial circumstances Z65 - Problems related to other psychosocial circumstances

## 2024-09-05 NOTE — Patient Instructions (Incomplete)
Moderate COPD (chronic obstructive pulmonary disease) (HCC)  - stable disease - resepect flu shot refusal - respect that you want to return o2 and do not feel you need it  Plan  - ok to return o2  - continue spiriva and dulera schedule with albuterol as needed   Cancer screening Stopped smoking with greater than 40 pack year history  - CT chest Nov 2022 without nodules or cancer or pneumonia or fibrosis - very low risk of cancer of lung next 1 year - glad smoking in remission since April 2022  Plan  - LDCT scan Nov 2023 per the screening program  OSA (obstructive sleep apnea)  Plan  - cPAP per young  Morbid (severe) obesity due to excess calories (HCC)  - weight gain after quitting smoking  Plan  - weight loss via PCP Abonza, Maritza, PA-C   Followup 3-6 months or sooner if needed

## 2024-09-06 ENCOUNTER — Ambulatory Visit: Admitting: Internal Medicine

## 2024-09-06 DIAGNOSIS — J441 Chronic obstructive pulmonary disease with (acute) exacerbation: Secondary | ICD-10-CM

## 2024-09-18 ENCOUNTER — Encounter: Payer: Self-pay | Admitting: Cardiology

## 2024-09-21 ENCOUNTER — Encounter: Payer: Self-pay | Admitting: Emergency Medicine

## 2024-09-21 ENCOUNTER — Ambulatory Visit: Attending: Emergency Medicine | Admitting: Emergency Medicine

## 2024-09-21 VITALS — BP 110/70 | HR 78 | Ht 67.0 in | Wt 275.0 lb

## 2024-09-21 DIAGNOSIS — I10 Essential (primary) hypertension: Secondary | ICD-10-CM | POA: Diagnosis not present

## 2024-09-21 DIAGNOSIS — E785 Hyperlipidemia, unspecified: Secondary | ICD-10-CM | POA: Diagnosis not present

## 2024-09-21 DIAGNOSIS — I2510 Atherosclerotic heart disease of native coronary artery without angina pectoris: Secondary | ICD-10-CM | POA: Diagnosis not present

## 2024-09-21 DIAGNOSIS — I483 Typical atrial flutter: Secondary | ICD-10-CM

## 2024-09-21 DIAGNOSIS — G4733 Obstructive sleep apnea (adult) (pediatric): Secondary | ICD-10-CM | POA: Diagnosis not present

## 2024-09-21 MED ORDER — DILTIAZEM HCL ER COATED BEADS 120 MG PO CP24: 120.0000 mg | ORAL_CAPSULE | Freq: Every day | ORAL | 1 refills | Status: AC

## 2024-09-21 NOTE — Progress Notes (Signed)
 " Cardiology Office Note:    Date:  09/21/2024  ID:  Ralph Mack, DOB 14-Feb-1966, MRN [id number] PCP: Gayle Saddie JULIANNA DEVONNA   [facility] Providers Cardiologist:  Lynwood Schilling, MD Electrophysiologist:  OLE ONEIDA HOLTS, MD       Patient Profile:       Chief Complaint: 1 year follow-up History of Present Illness:  Ralph Mack is a 58 y.o. male with visit-pertinent history of nonobstructive CAD, typical atrial flutter, dyspnea on exertion, hypertension, hyperlipidemia, hypertriglyceridemia, COPD, OSA, hypothyroidism, prediabetes, tobacco abuse, alcohol use  Nonobstructive CAD. LHC performed in [place]  2009: Nonobstructive CAD. Typical a-flutter. 14-day ZIO 10/21/2018: Brief episodes of a-flutter. S/p a-flutter ablation 04/06/2021. Loop recorder implantation 05/04/2021. In office device interrogation by Dr. Francyne 11/27/2022: Device interrogation does not show any evidence of significant bradycardia or pauses or meaningful ventricular arrhythmia.Multiple episodes labeled by the device as representing atrial fibrillation actually represent sinus rhythm or mild sinus tachycardia with frequent premature atrial contractions.  Distinct P waves are seen preceding each QRS complex.  There is no true atrial fibrillation seen. DOE. Echo 01/03/2023: EF 55 to 60%.  Mild LVH.  Grade I DD.  Normal RV function.  Mild LAE.  Moderate MAC.  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 Echo 12/2023: LVEF 55 to 60%, septal motion consistent with conduction delay, mild LVH, normal RV function.  Left atrial size mildly dilated, mild mitral valve regurgitation Hypertension. Hyperlipidemia/hypertriglyceridemia. Lipid panel 04/10/2022: LDL 119, HDL 47, TG 125, total 188. COPD. OSA. Hypothyroidism. Prediabetes. Tobacco abuse. Complete cessation April 2022. Alcohol use  Patient was last seen in clinic on 02/25/2023.  He was doing well without chest pains and remained active without exertional symptoms.  No  changes were made and he was to follow-up in 6 months.   Discussed the use of AI scribe software for clinical note transcription with the patient, who gave verbal consent to proceed.  History of Present Illness Ralph Mack is a 58 year old male with atrial flutte an CAD who presents for a cardiovascular follow-up.  Today he is doing well without acute cardiovascular concerns or complaints.  He has an implanted loop recorder that has not been interrogated since February 2024.  He has not had palpitations or other atrial flutter symptoms.  He denies symptoms concerning for recurrent atrial flutter.  He is not very active but does deny any exertional chest discomfort or anginal symptoms.  He has COPD and sleep apnea on CPAP.  Reports his dyspnea is well-controlled on inhaler therapy.  A rescue inhaler gives partial relief.  He does follow closely with a pulmonologist.  He will occasionally experience gas pains in his stomach.  When he burps he feels much improved.  He has been working on decreasing spicy foods in his diet.   He works as a naval architect with limited exercise and feels exhausted after yard work. He is not taking a statin and prefers diet and exercise. His LDL was 109 in May 2025.   He is without chest pains, orthopnea, PND, LEE, palpitations, syncope, presyncope, lightheadedness, dizziness   Review of systems:  Please see the history of present illness. All other systems are reviewed and otherwise negative.      Studies Reviewed:    EKG Interpretation Date/Time:  Tuesday September 21 2024 13:21:15 EST Ventricular Rate:  78 PR Interval:  210 QRS Duration:  150 QT Interval:  428 QTC Calculation: 487 R Axis:   -83  Text Interpretation: Sinus rhythm with marked  sinus arrhythmia with 1st degree A-V block Left axis deviation Right bundle branch block Lateral infarct , age undetermined Inferior infarct , age undetermined When compared with ECG of 11-Aug-2021 19:23, No  significant change since last tracing Confirmed by Rana Dixon [phone]) on 09/21/2024 5:27:27 PM    Echocardiogram 01/05/2024 1. Septal motion consistent with conduction delay. Left ventricular  ejection fraction, by estimation, is 55 to 60%. The left ventricle has  normal function. The left ventricular internal cavity size was moderately  dilated. There is mild left ventricular  hypertrophy.   2. Right ventricular systolic function is normal. The right ventricular  size is normal. There is normal pulmonary artery systolic pressure.   3. Left atrial size was mildly dilated.   4. Mild mitral valve regurgitation.   5. The aortic valve is tricuspid. Aortic valve regurgitation is not  visualized.   6. The inferior vena cava is normal in size with greater than 50%  respiratory variability, suggesting right atrial pressure of 3 mmHg.   Echocardiogram 01/03/2023 1. Left ventricular ejection fraction, by estimation, is 55 to 60%. The  left ventricle has normal function. The left ventricle has no regional  wall motion abnormalities. There is mild left ventricular hypertrophy.  Left ventricular diastolic parameters  are consistent with Grade I diastolic dysfunction (impaired relaxation).   2. Right ventricular systolic function is normal. The right ventricular  size is mildly enlarged.   3. Left atrial size was mildly dilated.   4. The mitral valve is normal in structure. No evidence of mitral valve  regurgitation. No evidence of mitral stenosis. Moderate mitral annular  calcification.   5. The aortic valve is normal in structure. Aortic valve regurgitation is  not visualized. No aortic stenosis is present.   6. Aortic dilatation noted. There is mild dilatation of the ascending  aorta, measuring 40 mm.   7. The inferior vena cava is normal in size with greater than 50%  respiratory variability, suggesting right atrial pressure of 3 mmHg.  Risk Assessment/Calculations:               Physical Exam:   VS:  BP 110/70 (BP Location: Left Arm, Patient Position: Sitting, Cuff Size: Large)   Pulse 78   Ht 5' 7 (1.702 m)   Wt 275 lb (124.7 kg)   BMI 43.07 kg/m    Wt Readings from Last 3 Encounters:  09/21/24 275 lb (124.7 kg)  08/08/24 261 lb 14.5 oz (118.8 kg)  04/05/24 262 lb (118.8 kg)    GEN: Well nourished, well developed in no acute distress NECK: No JVD; No carotid bruits CARDIAC: RRR, no murmurs, rubs, gallops RESPIRATORY:  Clear to auscultation without rales, wheezing or rhonchi  ABDOMEN: Soft, non-tender, non-distended EXTREMITIES:  No edema; No acute deformity      Assessment and Plan:  Coronary artery disease LHC ([place] ) in 2009 showed nonobstructive CAD Echo 12/2023 with LVEF 55 to 60% - Today he is stable without chest pains.  He maintains fairly active lifestyle without exertional symptoms.  No symptoms to suggest active angina.  No indication for ischemic evaluation at this time - Continue aspirin  81 mg daily - He is not amenable to statin therapy  Typical atrial flutter S/p a flutter ablation on 03/2021 S/p ILR on 04/2021 - EKG today shows patient is maintaining normal sinus rhythm - He denies symptoms concerning for recurrent atrial fibrillation - Loop recorder interrogated in office today with device representing time in AT/AF of 0.7% from 10/2022  to 08/2024 with these episodes likely representing frequent PACs and not true A-fib seen - Continue diltiazem  120 mg daily and propranolol  20 mg 3 times daily  Hypertension Blood pressure today is well-controlled at 110/70 - Continue diltiazem  120 mg daily, hydrochlorothiazide  12.5 mg daily, losartan  50 mg daily, propranolol  20 mg 3 times daily  Hyperlipidemia LDL 109 on 01/2024 - He is not amenable to starting statin therapy or other cholesterol-lowering therapy and prefers to manage his cholesterol with diet and exercise along  Obstructive sleep apnea - Remains adherent to CPAP therapy       Dispo:  Return in about 6 months (around 03/22/2025).  Signed, Lum LITTIE Louis, NP  "

## 2024-09-21 NOTE — Patient Instructions (Addendum)
 Medication Instructions:  NO CHANGES  Lab Work: NONE TO BE DONE TODAY.  Testing/Procedures: NONE  Follow-Up: At [facility], you and your health needs are our priority.  As part of our continuing mission to provide you with exceptional heart care, our providers are all part of one team.  This team includes your primary Cardiologist (physician) and Advanced Practice Providers or APPs (Physician Assistants and Nurse Practitioners) who all work together to provide you with the care you need, when you need it.  Your next appointment:   6 MONTHS  Provider:   Lynwood Schilling, MD

## 2024-09-27 ENCOUNTER — Encounter: Payer: Self-pay | Admitting: Adult Health

## 2024-09-27 ENCOUNTER — Other Ambulatory Visit: Payer: Self-pay

## 2024-09-27 ENCOUNTER — Ambulatory Visit: Admitting: Adult Health

## 2024-09-27 ENCOUNTER — Encounter: Payer: Self-pay | Admitting: Emergency Medicine

## 2024-09-27 ENCOUNTER — Telehealth: Payer: Self-pay

## 2024-09-27 VITALS — BP 115/77 | HR 77 | Temp 98.5°F | Ht 67.5 in | Wt 280.8 lb

## 2024-09-27 DIAGNOSIS — J449 Chronic obstructive pulmonary disease, unspecified: Secondary | ICD-10-CM

## 2024-09-27 DIAGNOSIS — Z6841 Body Mass Index (BMI) 40.0 and over, adult: Secondary | ICD-10-CM | POA: Diagnosis not present

## 2024-09-27 DIAGNOSIS — Z87891 Personal history of nicotine dependence: Secondary | ICD-10-CM | POA: Diagnosis not present

## 2024-09-27 DIAGNOSIS — G4733 Obstructive sleep apnea (adult) (pediatric): Secondary | ICD-10-CM | POA: Diagnosis not present

## 2024-09-27 DIAGNOSIS — I10 Essential (primary) hypertension: Secondary | ICD-10-CM

## 2024-09-27 MED ORDER — ZEPBOUND 2.5 MG/0.5ML ~~LOC~~ SOAJ: 2.5000 mg | SUBCUTANEOUS | 0 refills | Status: AC

## 2024-09-27 MED ORDER — ALBUTEROL SULFATE HFA 108 (90 BASE) MCG/ACT IN AERS: 1.0000 | INHALATION_SPRAY | Freq: Four times a day (QID) | RESPIRATORY_TRACT | 3 refills | Status: AC | PRN

## 2024-09-27 MED ORDER — BUDESONIDE-FORMOTEROL FUMARATE 160-4.5 MCG/ACT IN AERO: 2.0000 | INHALATION_SPRAY | Freq: Two times a day (BID) | RESPIRATORY_TRACT | 3 refills | Status: AC

## 2024-09-27 MED ORDER — SPIRIVA RESPIMAT 2.5 MCG/ACT IN AERS: 2.0000 | INHALATION_SPRAY | Freq: Every day | RESPIRATORY_TRACT | 3 refills | Status: AC

## 2024-09-27 NOTE — Patient Instructions (Addendum)
 Continue on Breyna  2 puffs Twice daily , rinse after use.  Continue on Spiriva  2 puffs daily  Albuterol  inhaler or Duoneb  As needed   Saline nasal rinses Twice daily  As needed   Mucinex DM Twice daily  As needed  cough/congestion .   Continue on CPAP At bedtime   Work on healthy weight loss.  Do not drive if sleepy  Avoid sedating medications as able.   Begin Zepbound 2.5mg  injection weekly,  Work on healthy weight loss   Follow up in 4 weeks and As needed     Notify your provider if you are planning to have a procedure/surgery, as this medication will need to be stopped prior.

## 2024-09-27 NOTE — Telephone Encounter (Signed)
 Received pt's reviewed and signed CPAP compliance report from Madelin Stank, NP. One additional copy was made. The original was placed into the scan folder in B Pod, including pt's name, DOB, and MRN.  The copy was placed in Tammy Parrett's cabinet in B Pod. NFN.

## 2024-09-27 NOTE — Progress Notes (Signed)
 "  @Patient  ID: Ralph Mack, male    DOB: Jul 24, 1966, 58 y.o.   MRN: [id number]  Chief Complaint  Patient presents with   Medical Management of Chronic Issues    COPD and OSA    Referring provider: Gayle Saddie JULIANNA DEVONNA  HPI: 58 year old male former smoker followed for mild COPD and obstructive sleep apnea Medical history significant for atrial flutter, lymphocytic colitis, diastolic heart failure Truck driver for [employer]     TEST/EVENTS : Reviewed 09/27/2024  HST- 03/28/21- AHI 60/ hr, desaturation 49-82%, body weight 252 lbs PFTs March 08, 2021 FEV1 66%, ratio 70, FVC 72%, no significant bronchodilator response, DLCO 65%   CT chest 08/11/21 -Lungs clear , negative PE     Discussed the use of AI scribe software for clinical note transcription with the patient, who gave verbal consent to proceed.  History of Present Illness Ralph Mack is a 58 year old male with sleep apnea and COPD who presents for a checkup on his CPAP usage and COPD management.  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 He experiences significant dry mouth, requiring frequent water intake during the night. He has tried both nasal and full face masks but finds the full face mask inconvenient .  CPAP download shows excellent compliance with daily average usage at 6 hours.  AHI 3.0/hour Since February, he has gained weight, attributed to a change in job that has significantly reduced his physical activity. Previously, he walked four to five miles a day, but now he walks less than a mile. He no longer engages in physical work, contributing to his weight gain. He is not diabetic and reports that he finds it difficult to exercise, as he becomes exhausted after physical activity.  He has tried multiple things for weight loss in the past.  Has been unsuccessful.  Is interested in Zepbound.  He quit smoking on January 06, 2021, and has not smoked since.  He is currently using Breyna   and Spiriva  for his COPD. He experiences fatigue and breathlessness, especially after exertion.  Had recent sinus infection couple months ago.  Symptoms have resolved on antibiotic therapy.  Denies any hemoptysis.  We discussed the lung cancer CT screening program.  has a history of lymphocytic colitis, which was treated with Bentyl , and has not had issues with colitis in the past five years.  No history of thyroid  cancer, pancreatitis, chronic kidney disease, gallbladder or liver disease.     Allergies[1]  Immunization History  Administered Date(s) Administered   Influenza,inj,Quad PF,6+ Mos 07/26/2015   Tdap 02/27/2022   Zoster Recombinant(Shingrix) 02/27/2022, 05/16/2022    Past Medical History:  Diagnosis Date   Acute on chronic respiratory failure (HCC) 01/06/2021   Walk Test 02/20/21- desat to 85% during second lap with HR 90. Improved to 95% on 2L. Patient denied shortness of breath during walk.   Allergy    Anxiety    Arthritis    Asthma    Cancer (HCC)    CHF (congestive heart failure) (HCC)    COPD (chronic obstructive pulmonary disease) (HCC)    Diabetes mellitus without complication (HCC)    Hx of colonic polyp 11/13/2016   10/2016 7 mm ssp/a 7/25 2 dimin adenomas   Hyperlipidemia    Hypertension    Hypothyroidism    IBS (irritable bowel syndrome)    Lymphocytic colitis 04/19/2019   Mild CAD 2009   Obesity    OSA (obstructive sleep apnea)  04/20/2021   Paroxysmal atrial flutter (HCC)    Prediabetes    Recurrent upper respiratory infection (URI)    Sleep apnea    Substance abuse (HCC)     Tobacco History: Tobacco Use History[2] Counseling given: Not Answered Tobacco comments: Quit smoking 01/06/2021   Outpatient Medications Prior to Visit  Medication Sig Dispense Refill   albuterol  (VENTOLIN  HFA) 108 (90 Base) MCG/ACT inhaler INHALE 1 TO 2 PUFFS BY MOUTH EVERY 6 HOURS AS NEEDED FOR WHEEZING FOR SHORTNESS OF BREATH 18 g 2   allopurinol  (ZYLOPRIM ) 300 MG  tablet Take 1 tablet by mouth once daily 90 tablet 1   azelastine  (ASTELIN ) 0.1 % nasal spray Place 2 sprays into both nostrils 2 (two) times daily. Use in each nostril as directed 30 mL 2   budesonide -formoterol  (BREYNA ) 160-4.5 MCG/ACT inhaler Inhale 2 puffs into the lungs in the morning and at bedtime. 3 each 3   diltiazem  (CARDIZEM  CD) 120 MG 24 hr capsule Take 1 capsule (120 mg total) by mouth daily. 90 capsule 1   fexofenadine (ALLEGRA) 180 MG tablet Take 180 mg by mouth daily as needed for allergies.     hydrochlorothiazide  (MICROZIDE ) 12.5 MG capsule Take 1 capsule by mouth once daily 90 capsule 0   ipratropium-albuterol  (DUONEB) 0.5-2.5 (3) MG/3ML SOLN Take 3 mLs by nebulization every 6 (six) hours as needed. 120 mL 3   levothyroxine  (SYNTHROID ) 50 MCG tablet Take 1 tablet by mouth once daily 90 tablet 0   losartan  (COZAAR ) 50 MG tablet Take 1 tablet by mouth once daily 90 tablet 0   Melatonin 10 MG TABS Take 1 tablet by mouth at bedtime as needed.     propranolol  (INDERAL ) 20 MG tablet TAKE 1 TABLET BY MOUTH THREE TIMES DAILY 270 tablet 0   sildenafil  (VIAGRA ) 50 MG tablet TAKE 1 TABLET BY MOUTH ONCE DAILY AS NEEDED FOR ERECTILE DYSFUNCTION 10 tablet 0   Tiotropium Bromide Monohydrate  (SPIRIVA  RESPIMAT) 2.5 MCG/ACT AERS INHALE 2 SPRAY(S) BY MOUTH ONCE DAILY 1 each 5   vitamin B-12 (CYANOCOBALAMIN ) 100 MCG tablet Take 100 mcg by mouth daily.     benzonatate  (TESSALON ) 100 MG capsule Take 1-2 capsules (100-200 mg total) by mouth 3 (three) times daily as needed. (Patient not taking: Reported on 09/27/2024) 30 capsule 0   dicyclomine  (BENTYL ) 20 MG tablet TAKE 1 TABLET BY MOUTH 4 TIMES DAILY BEFORE MEAL(S) AND AT BEDTIME AS NEEDED (Patient not taking: Reported on 09/27/2024) 360 tablet 0   predniSONE  (STERAPRED UNI-PAK 21 TAB) 10 MG (21) TBPK tablet 6 day taper; take as directed on package instructions (Patient not taking: Reported on 09/27/2024) 21 tablet 0   No facility-administered  medications prior to visit.     Review of Systems:   Constitutional:   No  weight loss, night sweats,  Fevers, chills, +fatigue, or  lassitude.  HEENT:   No headaches,  Difficulty swallowing,  Tooth/dental problems, or  Sore throat,                No sneezing, itching, ear ache, nasal congestion, post nasal drip,   CV:  No chest pain,  Orthopnea, PND, swelling in lower extremities, anasarca, dizziness, palpitations, syncope.   GI  No heartburn, indigestion, abdominal pain, nausea, vomiting, diarrhea, change in bowel habits, loss of appetite, bloody stools.   Resp:  No chest wall deformity  Skin: no rash or lesions.  GU: no dysuria, change in color of urine, no urgency or frequency.  No flank  pain, no hematuria   MS:  No joint pain or swelling.  No decreased range of motion.  No back pain.    Physical Exam  BP 115/77   Pulse 77   Temp 98.5 F (36.9 C)   Ht 5' 7.5 (1.715 m) Comment: Per pt  Wt 280 lb 12.8 oz (127.4 kg)   SpO2 94% Comment: RA  BMI 43.33 kg/m   GEN: A/Ox3; pleasant , NAD, BMI 43   HEENT:  [place]/AT,   NOSE-clear, THROAT-clear, no lesions, no postnasal drip or exudate noted.   NECK:  Supple w/ fair ROM; no JVD; normal carotid impulses w/o bruits; no thyromegaly or nodules palpated; no lymphadenopathy.    RESP  Clear  P & A; w/o, wheezes/ rales/ or rhonchi. no accessory muscle use, no dullness to percussion  CARD:  RRR, no m/r/g, no peripheral edema, pulses intact, no cyanosis or clubbing.  GI:   Soft & nt; nml bowel sounds; no organomegaly or masses detected.   Musco: Warm bil, no deformities or joint swelling noted.   Neuro: alert, no focal deficits noted.    Skin: Warm, no lesions or rashes    Lab Results:Reviewed 09/27/2024   CBC    Component Value Date/Time   WBC 7.8 02/16/2024 0955   WBC 14.8 (H) 08/13/2021 0504   RBC 4.24 02/16/2024 0955   RBC 3.70 (L) 08/13/2021 0504   HGB 14.0 02/16/2024 0955   HCT 41.7 02/16/2024 0955   PLT 183  02/16/2024 0955   MCV 98 (H) 02/16/2024 0955   MCH 33.0 02/16/2024 0955   MCH 26.5 08/13/2021 0504   MCHC 33.6 02/16/2024 0955   MCHC 30.2 08/13/2021 0504   RDW 12.4 02/16/2024 0955   LYMPHSABS 1.9 02/16/2024 0955   MONOABS 0.9 08/11/2021 1628   EOSABS 0.3 02/16/2024 0955   BASOSABS 0.1 02/16/2024 0955    BMET    Component Value Date/Time   NA 134 02/16/2024 0955   K 4.6 02/16/2024 0955   CL 94 (L) 02/16/2024 0955   CO2 25 02/16/2024 0955   GLUCOSE 94 02/16/2024 0955   GLUCOSE 132 (H) 08/13/2021 0504   BUN 14 02/16/2024 0955   CREATININE 0.78 02/16/2024 0955   CALCIUM  9.4 02/16/2024 0955   GFRNONAA >60 08/13/2021 0504   GFRAA 121 08/07/2020 1100    BNP    Component Value Date/Time   BNP 72.3 01/06/2021 0932    ProBNP No results found for: PROBNP  Imaging: No results found.  Administration History     None          Latest Ref Rng & Units 03/08/2021    9:04 AM  PFT Results  FVC-Pre L 3.04   FVC-Predicted Pre % 69   FVC-Post L 3.15   FVC-Predicted Post % 72   Pre FEV1/FVC % % 71   Post FEV1/FCV % % 70   FEV1-Pre L 2.16   FEV1-Predicted Pre % 64   FEV1-Post L 2.22   DLCO uncorrected ml/min/mmHg 16.68   DLCO UNC% % 65   DLCO corrected ml/min/mmHg 16.68   DLCO COR %Predicted % 65   DLVA Predicted % 97   TLC L 5.65   TLC % Predicted % 88   RV % Predicted % 88     No results found for: NITRICOXIDE     02/20/2021    9:00 AM  Results of the [facility] flowsheet  Sitting and reading 2  Watching TV 2  Sitting, inactive in a public place (  e.g. a theatre or a meeting) 2  As a passenger in a car for an hour without a break 3  Lying down to rest in the afternoon when circumstances permit 3  Sitting and talking to someone 0  Sitting quietly after a lunch without alcohol 1  In a car, while stopped for a few minutes in traffic 0  Total score 13        Assessment & Plan:   Assessment and Plan Assessment & Plan Obstructive sleep apnea   -excellent control compliance on nocturnal CPAP His obstructive sleep apnea is well-managed with CPAP therapy. He uses it consistently every night with excellent AHI results. He experiences dry mouth, which is managed by increased water intake.  CPAP care discussed there are no issues with CPAP usage. Continue CPAP therapy nightly.  Chronic obstructive pulmonary disease  -moderate with daily symptom burden COPD is managed with Breyna , Spiriva , and albuterol . He reports decreased physical activity due to a job change, leading to weight gain . He has not smoked since April 2022.  He is a candidate for lung cancer screening  Continue Breyna , Spiriva , and albuterol . A pulmonary function test is ordered to assess lung capacity. He is referred for an annual lung cancer screening CT scan. Inhalers were refilled.  Obesity  -Morbid with BMI 43-unsuccessful weight loss Patient has severe  sleep apnea related to obesity with BMI >/30  or is overweight  posing significant cardiovascular risks. Patient has failed traditional weight loss measures with caloric deficit and consistent exercise of 150 min/week for >/6 months. Patient will be initiated on Zepbound (tirzepatide) for weight management. Zepbound is the only pharmaceutical treatment approved for moderate-to-severe OSA in adults who are overweight (BMI >/27) or obese (BMI >/30). The patient will continue lifestyle modifications, including structured nutrition and physical activity as directed. No other GLP1 therapy will be used simultaneously at this time. The patient does not have any FDA labeled contraindications to this agent, including pregnancy, lactation, hx or family history of medullary thyroid  cancer, or multiple endocrine neoplasia type II. Side effect profile has been reviewed with patient. Aware of red flag symptoms to notify of immediately or seek emergency care, including severe nausea/vomiting, inability to pass bowels or gas, severe abdominal  pain/tenderness, jaundice.    Cost and insurance coverage issues were discussed. Emphasis was placed on the need for a balanced diet, regular exercise, and hydration. The potential for weight loss to improve sleep apnea and overall health was discussed. Zepbound is a weekly injection starting at 2.5 mg, with a target dose of 10-15 mg for sleep apnea. Dietary modifications and exercise were advised to enhance efficacy. The potential for direct purchase through [employer] if not covered by insurance was discussed. A follow-up is scheduled in four weeks to assess progress and medication tolerance.   History of tobacco abuse qualifies for the lung cancer CT screening program.  Referral sent     Madelin Stank, NP 09/27/2024     [1] No Known Allergies [2]  Social History Tobacco Use  Smoking Status Former   Current packs/day: 0.00   Average packs/day: 0.5 packs/day for 37.0 years (18.5 ttl pk-yrs)   Types: Cigarettes   Start date: 01/11/1984   Quit date: 01/10/2021   Years since quitting: 3.7  Smokeless Tobacco Never  Tobacco Comments   Quit smoking 01/06/2021   "

## 2024-10-06 ENCOUNTER — Other Ambulatory Visit (HOSPITAL_COMMUNITY): Payer: Self-pay

## 2024-10-06 ENCOUNTER — Telehealth: Payer: Self-pay

## 2024-10-06 NOTE — Telephone Encounter (Signed)
*  Pulm  Pharmacy Patient Advocate Encounter   Received notification from Fax that prior authorization for Zepbound  2.5mg  pen is required/requested.   Insurance verification completed.   The patient is insured through [employer].   Per test claim: Per test claim, medication is not covered due to plan/benefit exclusion, PA not submitted at this time  Key: [id number]

## 2024-10-07 ENCOUNTER — Telehealth: Payer: Self-pay

## 2024-10-07 DIAGNOSIS — F17210 Nicotine dependence, cigarettes, uncomplicated: Secondary | ICD-10-CM

## 2024-10-07 DIAGNOSIS — Z87891 Personal history of nicotine dependence: Secondary | ICD-10-CM

## 2024-10-07 DIAGNOSIS — Z122 Encounter for screening for malignant neoplasm of respiratory organs: Secondary | ICD-10-CM

## 2024-10-07 NOTE — Telephone Encounter (Signed)
 Please call patient to let them know that Zepbound  is not covered through their insurance at this time.

## 2024-10-07 NOTE — Telephone Encounter (Signed)
 Lung Cancer Screening Narrative/Criteria Questionnaire (Cigarette Smokers Only- No Cigars/Pipes/vapes)   Ralph Mack   SDMV:10/11/2024 at 2:30 pm Wells       1966/08/23               LDCT: 10/11/2024 at 4:10 pm GI    58 y.o.   Phone: [phone]  Lung Screening Narrative (confirm age 78-77 yrs Medicare / 50-80 yrs Private pay insurance)   Insurance information: BCBS   Referring Provider: Gayle, PA   This screening involves an initial phone call with a team member from our program. It is called a shared decision making visit. The initial meeting is required by  insurance and Medicare to make sure you understand the program. This appointment takes about 15-20 minutes to complete. You will complete the screening scan at your scheduled date/time.  This scan takes about 5-10 minutes to complete. You can eat and drink normally before and after the scan.  Criteria questions for Lung Cancer Screening:   Are you a current or former smoker? Former Age began smoking: 16   If you are a former smoker, what year did you quit smoking? Quit 2 years ago  To calculate your smoking history, I need an accurate estimate of how many packs of cigarettes you smoked per day and for how many years. (Not just the number of PPD you are now smoking)   Years smoking 42 x Packs per day 1 = Pack years 42   (at least 20 pack yrs)   (Make sure they understand that we need to know how much they have smoked in the past, not just the number of PPD they are smoking now)  Do you have a personal history of cancer?  No    Do you have a family history of cancer? Yes  (cancer type and and relative) Mother had metastic cancer.   Are you coughing up blood?  No  Have you had unexplained weight loss of 15 lbs or more in the last 6 months? No  It looks like you meet all criteria.  When would be a good time for us  to schedule you for this screening?   Additional information: N/A

## 2024-10-07 NOTE — Telephone Encounter (Signed)
 Please advise zepbound  not covered

## 2024-10-08 NOTE — Telephone Encounter (Signed)
 Patient aware,has made lifestyle changes and lost 12 pounds and will continue.will keep future appointments [facility]

## 2024-10-11 ENCOUNTER — Ambulatory Visit

## 2024-10-11 ENCOUNTER — Other Ambulatory Visit

## 2024-10-11 DIAGNOSIS — Z87891 Personal history of nicotine dependence: Secondary | ICD-10-CM

## 2024-10-11 DIAGNOSIS — Z122 Encounter for screening for malignant neoplasm of respiratory organs: Secondary | ICD-10-CM

## 2024-10-11 NOTE — Progress Notes (Signed)
 Virtual Visit via Telephone Note  I connected with Ralph Mack on 10/11/2024 at  2:30 PM EST by telephone and verified that I am speaking with the correct person using two identifiers.  Location: Patient: At home, in [place] Provider: [address] [place], [place], [address]    I discussed the limitations, risks, security and privacy concerns of performing an evaluation and management service by telephone and the availability of in person appointments. I also discussed with the patient that there may be a patient responsible charge related to this service. The patient expressed understanding and agreed to proceed.  Shared Decision Making Visit Lung Cancer Screening Program [phone])   Eligibility: Age 59 y.o. Pack Years Smoking History Calculation 42 pack years  (# packs/per year x # years smoked) Recent History of coughing up blood  no Unexplained weight loss? no ( >Than 15 pounds within the last 6 months ) Prior History Lung / other cancer no (Diagnosis within the last 5 years already requiring surveillance chest CT Scans). Smoking Status Former Smoker Former Smokers: Years since quit: 4 years  Quit Date: 01/10/2021  Visit Components: Discussion included one or more decision making aids. yes Discussion included risk/benefits of screening. yes Discussion included potential follow up diagnostic testing for abnormal scans. yes Discussion included meaning and risk of over diagnosis. yes Discussion included meaning and risk of False Positives. yes Discussion included meaning of total radiation exposure. yes  Counseling Included: Importance of adherence to annual lung cancer LDCT screening. yes Impact of comorbidities on ability to participate in the program. yes Ability and willingness to under diagnostic treatment. yes  Smoking Cessation Counseling: Current Smokers:  Discussed importance of smoking cessation. N/A Information about tobacco cessation classes and interventions  provided to patient. N/A Patient provided with ticket for LDCT Scan. N/A Symptomatic Patient. N/A  Counseling N/A Diagnosis Code: Tobacco Use Z72.0 Asymptomatic Patient N/A  Counseling N/A Former Smokers:  Discussed the importance of maintaining cigarette abstinence. yes Diagnosis Code: Personal History of Nicotine  Dependence. S12.108 Information about tobacco cessation classes and interventions provided to patient. Yes Patient provided with ticket for LDCT Scan. N/A Written Order for Lung Cancer Screening with LDCT placed in Epic. Yes (CT Chest Lung Cancer Screening Low Dose W/O CM) [id number] Z12.2-Screening of respiratory organs Z87.891-Personal history of nicotine  dependence  Shared decision visit completed by Wells Georgia, FNP as a registered nurse awaiting credentialing.    Wells CHRISTELLA Georgia, FNP

## 2024-10-11 NOTE — Patient Instructions (Addendum)
 Thank you for participating in the [facility] Lung Cancer Screening Program. It was our pleasure to meet you today. We will call you with the results of your scan within the next few days. Your scan will be assigned a Lung RADS category score by the physicians reading the scans.  This Lung RADS score determines follow up scanning.  See below for description of categories, and follow up screening recommendations. We will be in touch to schedule your follow up screening annually or based on recommendations of our providers. We will fax a copy of your scan results to your Primary Care Physician, or the physician who referred you to the program, to ensure they have the results. Please call the office if you have any questions or concerns regarding your scanning experience or results.  Our office number is [phone]. Please speak with Karna Curly, RN., Karna Doom RN, or [facility] RN, and Isaiah Dover RN. They are  our Lung Cancer Screening RN.'s If They are unavailable when you call, Please leave a message on the voice mail. We will return your call at our earliest convenience.This voice mail is monitored several times a day.  Remember, if your scan is normal, we will scan you annually as long as you continue to meet the criteria for the program. (Age 1-80, Current smoker or smoker who has quit within the last 15 years). If you are a smoker, remember, quitting is the single most powerful action that you can take to decrease your risk of lung cancer and other pulmonary, breathing related problems. We know quitting is hard, and we are here to help.  Please let us  know if there is anything we can do to help you meet your goal of quitting. If you are a former smoker, counselling psychologist. We are proud of you! Remain smoke free! Remember you can refer friends or family members through the number above.  We will screen them to make sure they meet criteria for the program. Thank you for helping us   take better care of you by participating in Lung Screening..   Lung RADS Categories:  Lung RADS 1: no nodules or definitely non-concerning nodules.  Recommendation is for a repeat annual scan in 12 months.  Lung RADS 2:  nodules that are non-concerning in appearance and behavior with a very low likelihood of becoming an active cancer. Recommendation is for a repeat annual scan in 12 months.  Lung RADS 3: nodules that are probably non-concerning , includes nodules with a low likelihood of becoming an active cancer.  Recommendation is for a 51-month repeat screening scan. Often noted after an upper respiratory illness. We will be in touch to make sure you have no questions, and to schedule your 69-month scan.  Lung RADS 4 A: nodules with concerning findings, recommendation is most often for a follow up scan in 3 months or additional testing based on our provider's assessment of the scan. We will be in touch to make sure you have no questions and to schedule the recommended 3 month follow up scan.  Lung RADS 4 B:  indicates findings that are concerning. We will be in touch with you to schedule additional diagnostic testing based on our provider's  assessment of the scan.  Your CT Scan is scheduled for 10/25/2024 at 9:20 am at [facility].

## 2024-10-15 ENCOUNTER — Ambulatory Visit: Payer: Self-pay

## 2024-10-15 ENCOUNTER — Ambulatory Visit: Payer: Self-pay | Admitting: Adult Health

## 2024-10-15 NOTE — Telephone Encounter (Signed)
 FYI Only or Action Required?: FYI only for provider: ED advised.  Patient is followed in Pulmonology for COPD and OSA, last seen on 10/11/2024 by Ruthell Lauraine FALCON, NP.  Called Nurse Triage reporting Shortness of Breath and Fatigue.  Symptoms began several weeks ago.  Interventions attempted: Rescue inhaler.  Symptoms are: gradually worsening.  Triage Disposition: Go to ED Now (or PCP Triage)  Patient/caregiver understands and will follow disposition?: Unsure    O2 sat 56-72% intermittently for the past several months. Worse over the past 4 weeks. Currently 94%. Confirmed pulse ox working correctly after testing it on a young healthy person. SOB with exertion. Fatigue. No fever. Speaking in clear continuous full sentences and fully cognizant. No audible SOB or wheezing noted over the phone.   Triage cut short d/t severity of pt symptoms.  Advised ED d/t intermittently critically low O2 levels. 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 Explained risks of delaying care. Pt able to keep O2 sats up if he breaths deeply and takes his albuterol  inhaler every 4 hours. Advised pt to keep up with this to keep his sats up above 90% until he can get to a safe stopping point or pullover if not. Called [facility] and spoke with Channing to notify of possible ED refusal.    E2C2 Pulmonary Triage - Initial Assessment Questions Chief Complaint (e.g., cough, sob, wheezing, fever, chills, sweat or additional symptoms) *Go to specific symptom protocol after initial questions. O2 56-72%, SOB with exertion. Fatigue  How long have symptoms been present? Several months, worse over the past few weeks.   Have you used your inhalers/maintenance medication? Yes If yes, What medications? Albuterol  inhaler.    OXYGEN : Do you wear supplemental oxygen ? No If yes, How many liters are you supposed to  use? Denies  Do you monitor your oxygen  levels? Yes If yes, What is your reading (oxygen  level) today? 56-72%   What is your usual oxygen  saturation reading?  (Note: Pulmonary O2 sats should be 90% or greater) 89-92% normally    Copied from CRM #[id number]. Topic: Clinical - Red Word Triage >> Oct 15, 2024 12:22 PM Benton KIDD wrote: [facility] that prompted transfer to Nurse Triage: oxygen  level low   Please warm transfer Red Word call to Nurse Triage and then click Send Message and Close CRM. >> Oct 15, 2024 12:25 PM Whitney O wrote: Reason for Triage: patient is calling because his oxygen  level is 71  and trying to figure out what's going on Reason for Disposition  Patient sounds very sick or weak to the triager  Answer Assessment - Initial Assessment Questions 1. MAIN CONCERN OR SYMPTOM: What's your main concern? (e.g., low oxygen  level, breathing difficulty) What question do you have?     O2 sat 56-72% intermittently for the past several months  2.  OXYGEN  EQUIPMENT:  Are you having trouble with your oxygen  equipment?  (e.g., cannula, mask, tubing, tank, concentrator)     N/a  3. ONSET: When did the  Hypoxia  start?      Several months ago. Worse over the past few weeks  4. OXYGEN  THERAPY:      Denies  5. PULSE OXIMETER:      Yes  6. O2 MONITORING: What is the oxygen  level (pulse ox reading)? (e.g., 70-100%)     Currently 94% on RA  7.  VITAL SIGNS MONITORING: Do you monitor/measure your oxygen  level or vital signs (e.g., yes, no, measurements are automatically sent to provider/call center). Document CURRENT and NORMAL BASELINE values if available.       *No Answer*  8. BREATHING DIFFICULTY: Are you having any difficulty breathing? If Yes, ask: How bad is it?  (e.g., none, mild, moderate, severe)      SOB with exertion  9. OTHER SYMPTOMS: Do you have any other symptoms? (e.g., fever, change in sputum)     Fatigue  10. SMOKING: Do you smoke  currently? Is there anyone that smokes around you?  (Note: smoking around oxygen  is dangerous!)       *No Answer*  Protocols used: COPD Oxygen  Monitoring and Hypoxia-A-AH

## 2024-10-15 NOTE — Telephone Encounter (Signed)
 Spoke with the pt  He is currently about to go to ED  Nothing further needed

## 2024-10-15 NOTE — Telephone Encounter (Signed)
 Duplicate erroneous encounter

## 2024-10-16 ENCOUNTER — Emergency Department (HOSPITAL_BASED_OUTPATIENT_CLINIC_OR_DEPARTMENT_OTHER)

## 2024-10-16 ENCOUNTER — Emergency Department (HOSPITAL_BASED_OUTPATIENT_CLINIC_OR_DEPARTMENT_OTHER)
Admission: EM | Admit: 2024-10-16 | Discharge: 2024-10-16 | Disposition: A | Discharge status: Home / Self Care | Attending: Emergency Medicine | Admitting: Emergency Medicine

## 2024-10-16 ENCOUNTER — Encounter (HOSPITAL_BASED_OUTPATIENT_CLINIC_OR_DEPARTMENT_OTHER): Payer: Self-pay | Admitting: Emergency Medicine

## 2024-10-16 ENCOUNTER — Other Ambulatory Visit: Payer: Self-pay

## 2024-10-16 DIAGNOSIS — Z720 Tobacco use: Secondary | ICD-10-CM | POA: Diagnosis not present

## 2024-10-16 DIAGNOSIS — I509 Heart failure, unspecified: Secondary | ICD-10-CM | POA: Insufficient documentation

## 2024-10-16 DIAGNOSIS — J441 Chronic obstructive pulmonary disease with (acute) exacerbation: Secondary | ICD-10-CM | POA: Diagnosis not present

## 2024-10-16 DIAGNOSIS — E119 Type 2 diabetes mellitus without complications: Secondary | ICD-10-CM | POA: Insufficient documentation

## 2024-10-16 DIAGNOSIS — Z7901 Long term (current) use of anticoagulants: Secondary | ICD-10-CM | POA: Diagnosis not present

## 2024-10-16 DIAGNOSIS — I110 Hypertensive heart disease with heart failure: Secondary | ICD-10-CM | POA: Insufficient documentation

## 2024-10-16 DIAGNOSIS — R0602 Shortness of breath: Secondary | ICD-10-CM | POA: Diagnosis present

## 2024-10-16 DIAGNOSIS — Z79899 Other long term (current) drug therapy: Secondary | ICD-10-CM | POA: Insufficient documentation

## 2024-10-16 DIAGNOSIS — R600 Localized edema: Secondary | ICD-10-CM | POA: Insufficient documentation

## 2024-10-16 DIAGNOSIS — E875 Hyperkalemia: Secondary | ICD-10-CM | POA: Insufficient documentation

## 2024-10-16 LAB — BASIC METABOLIC PANEL WITH GFR
Anion gap: 10 (ref 5–15)
BUN: 15 mg/dL (ref 6–20)
CO2: 27 mmol/L (ref 22–32)
Calcium: 9.7 mg/dL (ref 8.9–10.3)
Chloride: 96 mmol/L — ABNORMAL LOW (ref 98–111)
Creatinine, Ser: 0.77 mg/dL (ref 0.61–1.24)
GFR, Estimated: >60 mL/min
Glucose, Bld: 105 mg/dL — ABNORMAL HIGH (ref 70–99)
Potassium: 5.3 mmol/L — ABNORMAL HIGH (ref 3.5–5.1)
Sodium: 132 mmol/L — ABNORMAL LOW (ref 135–145)

## 2024-10-16 LAB — CBC
HCT: 41.4 % (ref 39.0–52.0)
Hemoglobin: 14.4 g/dL (ref 13.0–17.0)
MCH: 33 pg (ref 26.0–34.0)
MCHC: 34.8 g/dL (ref 30.0–36.0)
MCV: 95 fL (ref 80.0–100.0)
Platelets: 187 K/uL (ref 150–400)
RBC: 4.36 MIL/uL (ref 4.22–5.81)
RDW: 12.6 % (ref 11.5–15.5)
WBC: 8.6 K/uL (ref 4.0–10.5)
nRBC: 0 % (ref 0.0–0.2)

## 2024-10-16 LAB — RESP PANEL BY RT-PCR (RSV, FLU A&B, COVID)  RVPGX2
Influenza A by PCR: NEGATIVE
Influenza B by PCR: NEGATIVE
Resp Syncytial Virus by PCR: NEGATIVE
SARS Coronavirus 2 by RT PCR: NEGATIVE

## 2024-10-16 LAB — D-DIMER, QUANTITATIVE: D-Dimer, Quant: 0.28 ug{FEU}/mL (ref 0.00–0.50)

## 2024-10-16 LAB — TROPONIN T, HIGH SENSITIVITY: Troponin T High Sensitivity: <15 ng/L (ref 0–19)

## 2024-10-16 LAB — PRO BRAIN NATRIURETIC PEPTIDE: Pro Brain Natriuretic Peptide: 79.5 pg/mL

## 2024-10-16 MED ORDER — IPRATROPIUM-ALBUTEROL 0.5-2.5 (3) MG/3ML IN SOLN
3.0000 mL | Freq: Once | RESPIRATORY_TRACT | Status: AC
  Administered 2024-10-16: 3 mL via RESPIRATORY_TRACT
  Filled 2024-10-16: qty 3

## 2024-10-16 MED ORDER — AZITHROMYCIN 250 MG PO TABS: 250.0000 mg | ORAL_TABLET | Freq: Every day | ORAL | 0 refills | Status: AC

## 2024-10-16 MED ORDER — PREDNISONE 20 MG PO TABS: 40.0000 mg | ORAL_TABLET | Freq: Every day | ORAL | 0 refills | Status: AC

## 2024-10-16 MED ORDER — BENZONATATE 100 MG PO CAPS: 100.0000 mg | ORAL_CAPSULE | Freq: Three times a day (TID) | ORAL | 0 refills | Status: AC

## 2024-10-16 MED ORDER — METHYLPREDNISOLONE SODIUM SUCC 125 MG IJ SOLR
125.0000 mg | Freq: Once | INTRAMUSCULAR | Status: AC
  Administered 2024-10-16: 125 mg via INTRAVENOUS
  Filled 2024-10-16: qty 2

## 2024-10-16 NOTE — ED Notes (Signed)
 Patient transferred from waiting room to ED treatment room. Assuming pt care at this time.

## 2024-10-16 NOTE — ED Triage Notes (Signed)
 Pt reports increased SHOB over the last few months; reports he has gained some weight; gets some relief with inhalers; productive cough; speaking in full sentences, NAD

## 2024-10-16 NOTE — ED Notes (Signed)
 Ambulated patient around unit without oxygen . He ambulated at a fast pace, no SHOB noted; but his lowest SPO2 was 88% HR 95 RR 20. During recovery with instructed breathing technique his SPO2 increased to 96% on room air. His HR was 95 RR 20.  He stated  88% is usually my baseline. MD aware

## 2024-10-16 NOTE — ED Provider Notes (Signed)
 " [place] [facility] Provider Note   CSN: [id number] Arrival date & time: 10/16/24  1253     Patient presents with: Shortness of Breath   Ralph Mack is a 59 y.o. male with a past medical history significant for IBS, diabetes, hyperlipidemia, CHF, COPD, and hypertension who presents to the ED due to productive cough and worsening shortness of breath over the past few months.  Patient notes he gets short of breath with everyday activities.  Denies associated chest pain.  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  Denies history of blood clots.  Follows pulmonology for COPD.  Stopped smoking in 2022.  He has been using Breyna  and Spiriva  for COPD.  Patient notes he checked his O2 saturation yesterday which was in the 50s.  He notes he typically runs between 88 and 94% on room air.  Not on supplemental oxygen . Denies fever.  History obtained from patient and past medical records. No interpreter used during encounter.        Prior to Admission medications  Medication Sig Start Date End Date Taking? Authorizing Provider  azithromycin  (ZITHROMAX ) 250 MG tablet Take 1 tablet (250 mg total) by mouth daily. Take first 2 tablets together, then 1 every day until finished. 10/16/24  Yes Mackenzey Crownover, Aleck BROCKS, PA-C  benzonatate  (TESSALON ) 100 MG capsule Take 1 capsule (100 mg total) by mouth every 8 (eight) hours. 10/16/24  Yes Real Cona C, PA-C  predniSONE  (DELTASONE ) 20 MG tablet Take 2 tablets (40 mg total) by mouth daily for 5 days. 10/16/24 10/21/24 Yes Miata Culbreth, Aleck BROCKS, PA-C  albuterol  (VENTOLIN  HFA) 108 (90 Base) MCG/ACT inhaler Inhale 1-2 puffs into the lungs every 6 (six) hours as needed for wheezing or shortness of breath. 09/27/24   Parrett, Madelin RAMAN, NP  allopurinol  (ZYLOPRIM ) 300 MG tablet Take 1 tablet by mouth once daily 08/17/24   Chandra Toribio POUR, MD  azelastine  (ASTELIN ) 0.1 % nasal spray Place 2 sprays into both nostrils 2 (two)  times daily. Use in each nostril as directed 08/08/24   Graham, Laura E, PA-C  benzonatate  (TESSALON ) 100 MG capsule Take 1-2 capsules (100-200 mg total) by mouth 3 (three) times daily as needed. Patient not taking: Reported on 09/27/2024 08/15/24   Vivienne Delon HERO, PA-C  budesonide -formoterol  (BREYNA ) 160-4.5 MCG/ACT inhaler Inhale 2 puffs into the lungs in the morning and at bedtime. 09/27/24   Parrett, Madelin RAMAN, NP  dicyclomine  (BENTYL ) 20 MG tablet TAKE 1 TABLET BY MOUTH 4 TIMES DAILY BEFORE MEAL(S) AND AT BEDTIME AS NEEDED Patient not taking: Reported on 09/27/2024 12/08/23   Avram Lupita BRAVO, MD  diltiazem  (CARDIZEM  CD) 120 MG 24 hr capsule Take 1 capsule (120 mg total) by mouth daily. 09/21/24   Rana Lum CROME, NP  fexofenadine (ALLEGRA) 180 MG tablet Take 180 mg by mouth daily as needed for allergies.    [provider]  hydrochlorothiazide  (MICROZIDE ) 12.5 MG capsule Take 1 capsule by mouth once daily 09/27/24   Clapp, Kara F, PA-C  ipratropium-albuterol  (DUONEB) 0.5-2.5 (3) MG/3ML SOLN Take 3 mLs by nebulization every 6 (six) hours as needed. 08/08/24   Graham, Laura E, PA-C  levothyroxine  (SYNTHROID ) 50 MCG tablet Take 1 tablet by mouth once daily 08/19/24   Clapp, Kara F, PA-C  losartan  (COZAAR ) 50 MG tablet Take 1 tablet by mouth once daily 06/22/24   Gayle Saddie FALCON, PA-C  Melatonin 10 MG TABS Take 1 tablet by mouth at bedtime  as needed.    [provider]  predniSONE  (STERAPRED UNI-PAK 21 TAB) 10 MG (21) TBPK tablet 6 day taper; take as directed on package instructions Patient not taking: Reported on 09/27/2024 08/15/24   Vivienne Delon HERO, PA-C  propranolol  (INDERAL ) 20 MG tablet TAKE 1 TABLET BY MOUTH THREE TIMES DAILY 08/19/24   Clapp, Kara F, PA-C  sildenafil  (VIAGRA ) 50 MG tablet TAKE 1 TABLET BY MOUTH ONCE DAILY AS NEEDED FOR ERECTILE DYSFUNCTION 07/19/24   Gayle Numbers F, PA-C  Tiotropium Bromide (SPIRIVA  RESPIMAT) 2.5 MCG/ACT AERS Inhale 2 Inhalations  into the lungs daily. 09/27/24   Parrett, Madelin RAMAN, NP  tirzepatide  (ZEPBOUND ) 2.5 MG/0.5ML Pen Inject 2.5 mg into the skin once a week. 09/27/24   Parrett, Madelin RAMAN, NP  vitamin B-12 (CYANOCOBALAMIN ) 100 MCG tablet Take 100 mcg by mouth daily.    [provider]  apixaban  (ELIQUIS ) 5 MG TABS tablet Take 1 tablet (5 mg total) by mouth 2 (two) times daily. Patient not taking: No sig reported 01/10/21 04/06/21  Pokhrel, Laxman, MD    Allergies: Patient has no known allergies.    Review of Systems  Constitutional:  Positive for fatigue. Negative for fever.  Respiratory:  Positive for cough and shortness of breath.   Cardiovascular:  Negative for chest pain.    Updated Vital Signs BP 123/71 (BP Location: Right Arm)   Pulse 82   Temp 98.4 F (36.9 C) (Oral)   Resp 20   Ht 5' 7.5 (1.715 m)   Wt 127.4 kg   SpO2 93%   BMI 43.33 kg/m   Physical Exam Vitals and nursing note reviewed.  Constitutional:      General: He is not in acute distress.    Appearance: He is not ill-appearing.  HENT:     Head: Normocephalic.  Eyes:     Pupils: Pupils are equal, round, and reactive to light.  Cardiovascular:     Rate and Rhythm: Normal rate and regular rhythm.     Pulses: Normal pulses.     Heart sounds: Normal heart sounds. No murmur heard.    No friction rub. No gallop.  Pulmonary:     Effort: Pulmonary effort is normal.     Breath sounds: Normal breath sounds.     Comments: Decreased air movement. No wheeze or stridor.  Abdominal:     General: Abdomen is flat. There is no distension.     Palpations: Abdomen is soft.     Tenderness: There is no abdominal tenderness. There is no guarding or rebound.  Musculoskeletal:        General: Normal range of motion.     Cervical back: Neck supple.     Comments: 1+ pitting edema bilaterally. No calf tenderness.   Skin:    General: Skin is warm and dry.  Neurological:     General: No focal deficit present.     Mental Status: He is alert.   Psychiatric:        Mood and Affect: Mood normal.        Behavior: Behavior normal.     (all labs ordered are listed, but only abnormal results are displayed) Labs Reviewed  BASIC METABOLIC PANEL WITH GFR - Abnormal; Notable for the following components:      Result Value   Sodium 132 (*)    Potassium 5.3 (*)    Chloride 96 (*)    Glucose, Bld 105 (*)    All other components within normal limits  RESP PANEL BY RT-PCR (RSV, FLU A&B, COVID)  RVPGX2  CBC  PRO BRAIN NATRIURETIC PEPTIDE  D-DIMER, QUANTITATIVE  TROPONIN T, HIGH SENSITIVITY    EKG: EKG Interpretation Date/Time:  Saturday October 16 2024 13:06:01 EST Ventricular Rate:  81 PR Interval:  209 QRS Duration:  152 QT Interval:  409 QTC Calculation: 475 R Axis:   -87  Text Interpretation: Sinus rhythm Atrial premature complexes Borderline prolonged PR interval Right bundle branch block Anterolateral infarct, age indeterminate since last tracing no significant change Confirmed by Lenor Hollering [phone]) on 10/16/2024 4:15:56 PM  Radiology: [facility] Chest 2 View Result Date: 10/16/2024 EXAM: 2 VIEW(S) XRAY OF THE CHEST 10/16/2024 01:29:34 PM COMPARISON: 08/08/2024 CLINICAL HISTORY: shob FINDINGS: LINES, TUBES AND DEVICES: Loop recorder in left chest. LUNGS AND PLEURA: Linear opacities at left lung base consistent with scar or atelectasis. No focal airspace consolidation. No pleural effusion. No pneumothorax. HEART AND MEDIASTINUM: No acute abnormality of the cardiac and mediastinal silhouettes. BONES AND SOFT TISSUES: No acute osseous abnormality. IMPRESSION: 1. Linear opacities at left lung base consistent with scar or atelectasis. 2. No focal airspace consolidation, pleural effusion, or pneumothorax. Electronically signed by: Norleen Boxer MD 10/16/2024 02:18 PM EST RP Workstation: [id number]     Procedures   Medications Ordered in the ED  ipratropium-albuterol  (DUONEB) 0.5-2.5 (3) MG/3ML nebulizer solution 3 mL (3 mLs  Nebulization Given 10/16/24 1501)  methylPREDNISolone  sodium succinate (SOLU-MEDROL ) 125 mg/2 mL injection 125 mg (125 mg Intravenous Given 10/16/24 1516)                                    Medical Decision Making Amount and/or Complexity of Data Reviewed Independent Historian: spouse External Data Reviewed: notes.    Details: Pulmonology notes Labs: ordered. Decision-making details documented in ED Course. Radiology: ordered and independent interpretation performed. Decision-making details documented in ED Course. ECG/medicine tests: ordered and independent interpretation performed. Decision-making details documented in ED Course.  Risk Prescription drug management.   This patient presents to the ED for concern of SOB, this involves an extensive number of treatment options, and is a complaint that carries with it a high risk of complications and morbidity.  The differential diagnosis includes COPD, PE, ACS, PNA, etc  59 year old male presents to the ED due to productive cough and worsening shortness of breath over the past few months.  Denies associated chest pain.  No history of blood clots.  History of COPD.  Remote tobacco abuse history.  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  Upon arrival patient afebrile with O2 saturation in upper 80s and lower 90s on room air with good waveform.  Patient with decreased air movement.  No wheeze.  Has 1+ pitting edema bilaterally.  Routine labs ordered.  Chest x-ray to rule out evidence of pneumonia.  BNP to rule out CHF.  D-dimer to rule out PE.  DuoNeb and Solu-Medrol  given.  CBC with no leukocytosis.  Normal hemoglobin.  BMP with elevated potassium of 5.3. EKG without peaked T-waves.  Normal renal function.  D-dimer normal.  Low suspicion for PE.  BNP normal.  Low suspicion for CHF.  Troponin normal.  EKG normal sinus rhythm.  No signs of acute ischemia.  Low suspicion for ACS.  RVP negative.  Chest x-ray personally reviewed and  interpreted which demonstrates linear opacities at the left lung base consistent with scar versus atelectasis.  Patient ambulated  in the ED and dropped down to 87% with no difficulties.  Patient notes he typically gets this low.  Suspect symptoms likely related to COPD exacerbation.  Recommended admission for treatment for COPD exacerbation given hypoxic episodes while here in the ED  require 2L [place] however, patient declined and notes that he does not feel like he needs to be admitted to the hospital.  Will follow-up with PCP on Monday and pulmonology next week. Patient made aware that being hypoxic for long periods of time can cause determinantal effects, and could leave to death. He states understanding and still would like to be discharged home. Wife at bedside during entire discussion.   Discussed with Laverne with TOC who will work on getting patient home oxygen .  Suspect symptoms likely related to COPD exacerbation.  Given productive cough will discharge patient with antibiotics. Patient also discharged with steroids and tessalon  Perles for cough. Patient stable upon discharge. Strict ED precautions discussed with patient. Patient states understanding and agrees to plan. Patient discharged home in no acute distress and stable vitals.  Discussed with Dr. Lenor who agrees with assessment and plan  Co morbidities that complicate the patient evaluation  COPD Cardiac Monitoring: / EKG:  The patient was maintained on a cardiac monitor.  I personally viewed and interpreted the cardiac monitored which showed an underlying rhythm of: NSR, HR 81  Social Determinants of Health:  Remote tobacco abuse  Test / Admission - Considered:  Recommended admission for COPD exacerbation however, patient declined.  TOC was consulted to work on getting home oxygen  for patient.     Final diagnoses:  COPD exacerbation (HCC)  Hyperkalemia    ED Discharge Orders          Ordered    azithromycin  (ZITHROMAX )  250 MG tablet  Daily        10/16/24 1647    predniSONE  (DELTASONE ) 20 MG tablet  Daily        10/16/24 1647    benzonatate  (TESSALON ) 100 MG capsule  Every 8 hours        10/16/24 1647               Lorelle Aleck BROCKS, PA-C 10/16/24 1701    Lenor Hollering, MD 10/16/24 2304  "

## 2024-10-16 NOTE — Progress Notes (Signed)
 CSW spoke with [facility] provider regarding a consult, pt needs Oxygen , was previously on oxygen  in past. 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 Pt and spouse are leaving to get medications.  CSW then spoke to spouse as pt began coughing. Verified home address.  CSW called Jermaine at [facility] referred pt for home 02 and POC. [place] asked to have pt spouse to call [employer] driver at [phone] when reach home for set up. No further ICM needs.

## 2024-10-16 NOTE — Discharge Instructions (Addendum)
 It was a pleasure taking care of you today.  As discussed, it was recommended for you to be admitted to the hospital given your oxygen  was 88% however, you declined.  Our social worker is working on getting you home oxygen .  Your workup was reassuring.  I am sending you home with antibiotics, steroids, and cough medication.  Please follow-up with PCP and your pulmonologist this week for a recheck.  Return to the ER for any worsening symptoms.  Your potassium was elevated. Have it rechecked on Monday.  call [employer] driver at [phone] when reach home for set up-call

## 2024-10-16 NOTE — ED Notes (Signed)
 Increasing SOB on exertion and productive cough x months. Hx COPD. Patient states that he used to wear home O2, but hasn't needed it since 2022. Patient states his baseline O2 saturation is between 88%-92%. 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 Patient takes 81mg  ASA every day. Denies pain in legs. Denies SOB at rest. Denies chest pain. NAD noted. Patient speaking in complete sentences.

## 2024-10-18 ENCOUNTER — Other Ambulatory Visit: Payer: Self-pay

## 2024-10-18 ENCOUNTER — Telehealth: Payer: Self-pay | Admitting: *Deleted

## 2024-10-18 DIAGNOSIS — J449 Chronic obstructive pulmonary disease, unspecified: Secondary | ICD-10-CM

## 2024-10-18 NOTE — Telephone Encounter (Signed)
 Done will place DME order at the front to be faxed

## 2024-10-18 NOTE — Telephone Encounter (Signed)
 Copied from CRM [id number]. Topic: Clinical - Order For Equipment >> Oct 18, 2024  9:58 AM Alfonso ORN wrote: Reason for CRM: patient was in the emergency room on saturday  10/16/24 and need his provider to send doctor's information for patient to get a prescription for oxygen  To be faxed to [facility] so it can be delivered to patient   [facility] fax number [phone]

## 2024-10-20 ENCOUNTER — Telehealth: Payer: Self-pay | Admitting: *Deleted

## 2024-10-20 NOTE — Telephone Encounter (Signed)
 Copied from CRM [id number]. Topic: Clinical - Order For Equipment >> Oct 18, 2024  9:58 AM Alfonso ORN wrote: Reason for CRM: patient was in the emergency room on saturday  10/16/24 and need his provider to send doctor's information for patient to get a prescription for oxygen  To be faxed to [facility] so it can be delivered to patient   [facility] fax number [phone] >> Oct 18, 2024 12:08 PM Rozanna G wrote: Pt calling to see about getting the prescription order sent to [facility] advised him that someone has sent that information so the provider can get to working on this for him

## 2024-10-20 NOTE — Telephone Encounter (Signed)
 Informed pt he will need appt to qualify for 02. Pt has appt 2/4 with TP but says his work schedule is changing.  Copied from CRM [id number]. Topic: Clinical - Order For Equipment >> Oct 18, 2024 12:08 PM Rozanna MATSU wrote: Pt calling to see about getting the prescription order sent to [facility] advised him that someone has sent that information so the provider can get to working on this for him

## 2024-10-20 NOTE — Telephone Encounter (Signed)
 Copied from CRM [id number]. Topic: Clinical - Order For Equipment >> Oct 18, 2024  9:58 AM Alfonso ORN wrote: Reason for CRM: patient was in the emergency room on saturday  10/16/24 and need his provider to send doctor's information for patient to get a prescription for oxygen  To be faxed to [facility] so it can be delivered to patient   [facility] fax number [phone] >> Oct 18, 2024 12:08 PM Rozanna G wrote: Pt calling to see about getting the prescription order sent to [facility] advised him that someone has sent that information so the provider can get to working on this for him   ---------------------------------------------------------------------------------------------------------------------------------------------  Patient declined to be admitted from the ED and was under the impression that oxygen  was being set up for him at home.  He tried [employer], however the phone just rang and rang.  I advised him that oxygen  either has to be set up through the hospital or the office.  I let him know he would need a walk in the office and if he drops to 88% or less we can send orders to [facility] and the goal is go get oxygen  out to the home the same day.  He is currently scheduled to see Madelin Stank NP on 2/2 at 11:30 am, advised to arrive by 11:15 am for check in.  He verbalized understanding.  Nothing further needed.

## 2024-10-21 ENCOUNTER — Telehealth: Payer: Self-pay | Admitting: Acute Care

## 2024-10-21 ENCOUNTER — Telehealth: Payer: Self-pay

## 2024-10-21 NOTE — Telephone Encounter (Signed)
 Copied from CRM [id number]. Topic: Clinical - Prescription Issue >> Oct 20, 2024  3:32 PM Everette C wrote: Reason for CRM: Terri with [facility] would like to discuss the patient's recent oxygen  orders and their recent ED follow up. [facility] can be reached at [phone] option 1 and would like to be contacted by a member of clinical staff when available

## 2024-10-21 NOTE — Telephone Encounter (Signed)
 Copied from CRM #[id number]. Topic: Clinical - Prescription Issue >> Oct 20, 2024  1:54 PM Ralph Mack wrote: Reason for CRM: Patient has been going back and forth between [name], his PCP, and  Pulmonary trying to get the necessary information to [facility] in order to refill his oxygen  prescription and is requesting someone to please get this matter resolved for him as he is currently driving around with oxygen  but he supposed to have it in his truck at all times.  Patient is upset and feels like he is not being heard by [facility], please call patient back to advise when this matter is being worked on / resolved.  More information can be found in CRM from 1/19   Spoke with patient he needs to have a in office walk test done they can not just give him O2 by his claims    Spoke with patient VBU, he understand will be at his appointment

## 2024-10-21 NOTE — Telephone Encounter (Unsigned)
 Copied from CRM #[id number]. Topic: Clinical - Refused Triage >> Oct 21, 2024  3:37 PM Ralph Mack wrote: Patient/caller voiced complaints of severely dropping O2 saturations. Declined transfer to triage.  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 His normal range low, per patient report, is 88. Patient reports dropping to 78 today and 56 last week. Patient declines all notion of triage nurse conversation.   If patient is established, route message to the appropriate department clinical pool

## 2024-10-21 NOTE — Telephone Encounter (Signed)
 Copied from CRM [id number]. Topic: Clinical - Prescription Issue >> Oct 20, 2024  3:32 PM Everette C wrote: Reason for CRM: Terri with [facility] would like to discuss the patient's recent oxygen  orders and their recent ED follow up. Veva can be reached at [phone] option 1 and would like to be contacted by a member of clinical staff when available

## 2024-10-21 NOTE — Telephone Encounter (Signed)
 Caklled and spoke with Terri/ [facility]. Forward information to the provider

## 2024-10-22 NOTE — Telephone Encounter (Signed)
 I called and spoke to Ralph Mack. Ralph Mack states he just checked his o2 levels at they were at 90 and the highest has been 96. Ralph Mack states his o2 levels dropped as low as 78. Ralph Mack is not on any oxygen  as of now.   I informed Ralph Mack to keep his upcoming appt with Madelin Stank, NP on 11-01-2024 and to go ahead and keep a log of his sats from now until his appt so she can see where his levels have been at. Ralph Mack verbalized understanding. Ralph Mack would like to be started on o2. I informed Ralph Mack that if he has any worsening symptoms, then he needs to go to the ER. Ralph Mack verbalized understanding. NO symptoms as of now. NFN

## 2024-10-25 ENCOUNTER — Inpatient Hospital Stay: Admission: RE | Admit: 2024-10-25 | Source: Ambulatory Visit

## 2024-11-01 ENCOUNTER — Inpatient Hospital Stay: Admission: RE | Admit: 2024-11-01 | Source: Ambulatory Visit

## 2024-11-01 ENCOUNTER — Inpatient Hospital Stay: Admitting: Adult Health

## 2024-11-01 NOTE — Telephone Encounter (Signed)
 Yes, I have all the info [facility] was requiring for oxygen  but this includes PFTs, 6 minute walk test, as well as other lung function studies our office is unable to perform. Per Chart review, it looks like patient is working with his pulmonologist to get oxygen  covered. Has appt on 11/12/24 with them and there has been communication back and forth regarding this matter with the CMAs at that office.

## 2024-11-08 ENCOUNTER — Ambulatory Visit: Admitting: Adult Health

## 2024-11-12 ENCOUNTER — Other Ambulatory Visit

## 2024-11-12 ENCOUNTER — Inpatient Hospital Stay: Admitting: Adult Health
